# Patient Record
Sex: Female | Born: 1975 | Race: Black or African American | Hispanic: No | Marital: Single | Smoking: Former smoker
Health system: Southern US, Community
[De-identification: ages and names within clinical notes are randomized; demographics above are authoritative.]

## PROBLEM LIST (undated history)

## (undated) ENCOUNTER — Inpatient Hospital Stay (HOSPITAL_COMMUNITY): Payer: Self-pay

## (undated) DIAGNOSIS — Z872 Personal history of diseases of the skin and subcutaneous tissue: Secondary | ICD-10-CM

## (undated) DIAGNOSIS — J189 Pneumonia, unspecified organism: Secondary | ICD-10-CM

## (undated) DIAGNOSIS — N730 Acute parametritis and pelvic cellulitis: Secondary | ICD-10-CM

## (undated) DIAGNOSIS — K219 Gastro-esophageal reflux disease without esophagitis: Secondary | ICD-10-CM

## (undated) DIAGNOSIS — E559 Vitamin D deficiency, unspecified: Secondary | ICD-10-CM

## (undated) DIAGNOSIS — Z972 Presence of dental prosthetic device (complete) (partial): Secondary | ICD-10-CM

## (undated) DIAGNOSIS — Z8719 Personal history of other diseases of the digestive system: Secondary | ICD-10-CM

## (undated) DIAGNOSIS — I10 Essential (primary) hypertension: Secondary | ICD-10-CM

## (undated) HISTORY — DX: Acute parametritis and pelvic cellulitis: N73.0

---

## 1999-01-23 ENCOUNTER — Emergency Department (HOSPITAL_COMMUNITY): Admission: EM | Admit: 1999-01-23 | Discharge: 1999-01-23 | Payer: Self-pay | Admitting: *Deleted

## 1999-01-26 ENCOUNTER — Emergency Department (HOSPITAL_COMMUNITY): Admission: EM | Admit: 1999-01-26 | Discharge: 1999-01-26 | Payer: Self-pay | Admitting: *Deleted

## 1999-01-31 ENCOUNTER — Emergency Department (HOSPITAL_COMMUNITY): Admission: EM | Admit: 1999-01-31 | Discharge: 1999-01-31 | Payer: Self-pay | Admitting: Internal Medicine

## 1999-06-29 ENCOUNTER — Other Ambulatory Visit: Admission: RE | Admit: 1999-06-29 | Discharge: 1999-06-29 | Payer: Self-pay | Admitting: Obstetrics

## 1999-07-16 ENCOUNTER — Emergency Department (HOSPITAL_COMMUNITY): Admission: EM | Admit: 1999-07-16 | Discharge: 1999-07-16 | Payer: Self-pay | Admitting: Emergency Medicine

## 2000-02-08 DIAGNOSIS — Z872 Personal history of diseases of the skin and subcutaneous tissue: Secondary | ICD-10-CM

## 2000-02-08 HISTORY — DX: Personal history of diseases of the skin and subcutaneous tissue: Z87.2

## 2000-05-30 ENCOUNTER — Encounter: Admission: RE | Admit: 2000-05-30 | Discharge: 2000-05-30 | Payer: Self-pay | Admitting: Family Medicine

## 2000-05-30 ENCOUNTER — Encounter: Payer: Self-pay | Admitting: Family Medicine

## 2001-01-16 ENCOUNTER — Inpatient Hospital Stay (HOSPITAL_COMMUNITY): Admission: AD | Admit: 2001-01-16 | Discharge: 2001-01-16 | Payer: Self-pay | Admitting: Obstetrics

## 2001-02-07 HISTORY — PX: CYST EXCISION: SHX5701

## 2001-04-01 ENCOUNTER — Inpatient Hospital Stay (HOSPITAL_COMMUNITY): Admission: AD | Admit: 2001-04-01 | Discharge: 2001-04-01 | Payer: Self-pay | Admitting: Obstetrics

## 2001-04-04 ENCOUNTER — Inpatient Hospital Stay (HOSPITAL_COMMUNITY): Admission: AD | Admit: 2001-04-04 | Discharge: 2001-04-06 | Payer: Self-pay | Admitting: Obstetrics

## 2001-06-06 ENCOUNTER — Encounter (INDEPENDENT_AMBULATORY_CARE_PROVIDER_SITE_OTHER): Payer: Self-pay | Admitting: Specialist

## 2001-06-06 ENCOUNTER — Ambulatory Visit (HOSPITAL_BASED_OUTPATIENT_CLINIC_OR_DEPARTMENT_OTHER): Admission: RE | Admit: 2001-06-06 | Discharge: 2001-06-06 | Payer: Self-pay | Admitting: *Deleted

## 2001-12-16 ENCOUNTER — Encounter: Payer: Self-pay | Admitting: Emergency Medicine

## 2001-12-16 ENCOUNTER — Emergency Department (HOSPITAL_COMMUNITY): Admission: EM | Admit: 2001-12-16 | Discharge: 2001-12-17 | Payer: Self-pay | Admitting: Emergency Medicine

## 2003-01-21 ENCOUNTER — Other Ambulatory Visit: Admission: RE | Admit: 2003-01-21 | Discharge: 2003-01-21 | Payer: Self-pay | Admitting: Obstetrics and Gynecology

## 2003-02-08 ENCOUNTER — Inpatient Hospital Stay (HOSPITAL_COMMUNITY): Admission: AD | Admit: 2003-02-08 | Discharge: 2003-02-08 | Payer: Self-pay | Admitting: Obstetrics

## 2003-02-10 ENCOUNTER — Inpatient Hospital Stay (HOSPITAL_COMMUNITY): Admission: AD | Admit: 2003-02-10 | Discharge: 2003-02-10 | Payer: Self-pay | Admitting: Obstetrics

## 2003-02-12 ENCOUNTER — Inpatient Hospital Stay (HOSPITAL_COMMUNITY): Admission: AD | Admit: 2003-02-12 | Discharge: 2003-02-12 | Payer: Self-pay | Admitting: Obstetrics

## 2003-03-26 ENCOUNTER — Inpatient Hospital Stay (HOSPITAL_COMMUNITY): Admission: AD | Admit: 2003-03-26 | Discharge: 2003-03-26 | Payer: Self-pay | Admitting: Obstetrics

## 2003-05-04 ENCOUNTER — Emergency Department (HOSPITAL_COMMUNITY): Admission: EM | Admit: 2003-05-04 | Discharge: 2003-05-04 | Payer: Self-pay | Admitting: Emergency Medicine

## 2004-02-08 DIAGNOSIS — N730 Acute parametritis and pelvic cellulitis: Secondary | ICD-10-CM

## 2004-02-08 HISTORY — DX: Acute parametritis and pelvic cellulitis: N73.0

## 2004-02-17 ENCOUNTER — Ambulatory Visit (HOSPITAL_COMMUNITY): Admission: RE | Admit: 2004-02-17 | Discharge: 2004-02-17 | Payer: Self-pay | Admitting: Obstetrics & Gynecology

## 2004-03-12 ENCOUNTER — Emergency Department (HOSPITAL_COMMUNITY): Admission: EM | Admit: 2004-03-12 | Discharge: 2004-03-12 | Payer: Self-pay | Admitting: Emergency Medicine

## 2004-03-18 ENCOUNTER — Emergency Department (HOSPITAL_COMMUNITY): Admission: EM | Admit: 2004-03-18 | Discharge: 2004-03-19 | Payer: Self-pay | Admitting: Emergency Medicine

## 2004-10-08 ENCOUNTER — Emergency Department (HOSPITAL_COMMUNITY): Admission: EM | Admit: 2004-10-08 | Discharge: 2004-10-08 | Payer: Self-pay | Admitting: Emergency Medicine

## 2005-04-22 ENCOUNTER — Ambulatory Visit (HOSPITAL_COMMUNITY): Admission: RE | Admit: 2005-04-22 | Discharge: 2005-04-22 | Payer: Self-pay | Admitting: Obstetrics

## 2005-05-02 ENCOUNTER — Emergency Department (HOSPITAL_COMMUNITY): Admission: EM | Admit: 2005-05-02 | Discharge: 2005-05-03 | Payer: Self-pay | Admitting: Emergency Medicine

## 2006-08-22 ENCOUNTER — Emergency Department (HOSPITAL_COMMUNITY): Admission: EM | Admit: 2006-08-22 | Discharge: 2006-08-22 | Payer: Self-pay | Admitting: Emergency Medicine

## 2006-10-03 ENCOUNTER — Ambulatory Visit (HOSPITAL_COMMUNITY): Admission: RE | Admit: 2006-10-03 | Discharge: 2006-10-03 | Payer: Self-pay | Admitting: Obstetrics

## 2006-11-17 ENCOUNTER — Ambulatory Visit (HOSPITAL_COMMUNITY): Admission: RE | Admit: 2006-11-17 | Discharge: 2006-11-17 | Payer: Self-pay | Admitting: Obstetrics

## 2006-12-05 ENCOUNTER — Emergency Department (HOSPITAL_COMMUNITY): Admission: EM | Admit: 2006-12-05 | Discharge: 2006-12-05 | Payer: Self-pay | Admitting: Emergency Medicine

## 2007-08-28 ENCOUNTER — Emergency Department (HOSPITAL_COMMUNITY): Admission: EM | Admit: 2007-08-28 | Discharge: 2007-08-28 | Payer: Self-pay | Admitting: Emergency Medicine

## 2008-01-25 ENCOUNTER — Emergency Department (HOSPITAL_COMMUNITY): Admission: EM | Admit: 2008-01-25 | Discharge: 2008-01-25 | Payer: Self-pay | Admitting: Emergency Medicine

## 2009-03-18 ENCOUNTER — Emergency Department (HOSPITAL_COMMUNITY): Admission: EM | Admit: 2009-03-18 | Discharge: 2009-03-18 | Payer: Self-pay | Admitting: Emergency Medicine

## 2009-12-06 ENCOUNTER — Emergency Department (HOSPITAL_COMMUNITY): Admission: EM | Admit: 2009-12-06 | Discharge: 2009-12-06 | Payer: Self-pay | Admitting: Emergency Medicine

## 2010-04-21 LAB — WOUND CULTURE: Gram Stain: NONE SEEN

## 2010-06-25 NOTE — Op Note (Signed)
[place]  Patient:    Emily Saunders, Emily Saunders Visit Number: [id number] MRN: [id number]          Service Type: DSU Location: [facility] Attending Physician:  Kendell Bane Dictated by:   Lowell Bouton, M.D. Proc. Date: 06/06/01 Admit Date:  06/06/2001   CC:         Kathreen Cosier, M.D.   Operative Report  PREOPERATIVE DIAGNOSIS:  Dorsal ganglion, right wrist.  POSTOPERATIVE DIAGNOSIS:  Dorsal ganglion, right wrist.  OPERATION PERFORMED:  Excision of dorsal ganglion right wrist.  SURGEON:  Lowell Bouton, M.D.  ANESTHESIA:  General.  OPERATIVE FINDINGS:  The patient had a large dorsal ganglion that appeared to arise from the scapholunate interval.  There was an accessory muscle that was just volar to the ganglion.  DESCRIPTION OF PROCEDURE:  Under general anesthesia with a tourniquet on the right arm, the right hand was prepped and draped in the usual fashion and after exsanguinating the limb, the tourniquet was inflated to 225 mHg.  A transverse incision was made on the dorsum of the wrist overlying the mass. Blunt dissection was carried through the subcutaneous tissues and bleeding points were coagulated.  Blunt dissection was carried down to the cyst and it was dissected out bluntly off of the capitate distally and then proximally at the scapholunate area where the stalk was identified.  After completely excising the ganglion, the stalk was completely excised and the capsule was opened.  No arthritic changes were noted.  There was a small accessory muscle distally adherent to the volar aspect of the ganglion.  This did not appear to have any function.  The wound was then irrigated copiously.  Bleeding was controlled with electrocautery.  A vessel loop drain was left in for drainage and the subcutaneous tissues were closed with 4-0 Vicryl, the skin with a 3-0 subcuticular Prolene.  Steri-Strips were applied,  followed by sterile dressings and a volar wrist splint.  The patient tolerated the procedure well and went to the recovery room awake and stable in good condition. Dictated by:   Lowell Bouton, M.D. Attending Physician:  Kendell Bane DD:  06/06/01 TD:  06/06/01 Job: [id number]

## 2010-07-19 ENCOUNTER — Emergency Department (HOSPITAL_COMMUNITY)
Admission: EM | Admit: 2010-07-19 | Discharge: 2010-07-19 | Disposition: A | Discharge status: Home / Self Care | Payer: Self-pay | Attending: Emergency Medicine | Admitting: Emergency Medicine

## 2010-07-19 ENCOUNTER — Emergency Department (HOSPITAL_COMMUNITY): Payer: Self-pay

## 2010-07-19 DIAGNOSIS — R05 Cough: Secondary | ICD-10-CM | POA: Insufficient documentation

## 2010-07-19 DIAGNOSIS — R059 Cough, unspecified: Secondary | ICD-10-CM | POA: Insufficient documentation

## 2010-07-19 DIAGNOSIS — F17200 Nicotine dependence, unspecified, uncomplicated: Secondary | ICD-10-CM | POA: Insufficient documentation

## 2010-07-19 DIAGNOSIS — J40 Bronchitis, not specified as acute or chronic: Secondary | ICD-10-CM | POA: Insufficient documentation

## 2010-07-30 ENCOUNTER — Emergency Department (HOSPITAL_COMMUNITY)
Admission: EM | Admit: 2010-07-30 | Discharge: 2010-07-31 | Disposition: A | Discharge status: Home / Self Care | Payer: Medicaid Other | Attending: Emergency Medicine | Admitting: Emergency Medicine

## 2010-07-30 DIAGNOSIS — R059 Cough, unspecified: Secondary | ICD-10-CM | POA: Insufficient documentation

## 2010-07-30 DIAGNOSIS — J40 Bronchitis, not specified as acute or chronic: Secondary | ICD-10-CM | POA: Insufficient documentation

## 2010-07-30 DIAGNOSIS — R05 Cough: Secondary | ICD-10-CM | POA: Insufficient documentation

## 2010-12-10 ENCOUNTER — Emergency Department (HOSPITAL_COMMUNITY)
Admission: EM | Admit: 2010-12-10 | Discharge: 2010-12-11 | Disposition: A | Discharge status: Home / Self Care | Payer: Self-pay | Attending: Emergency Medicine | Admitting: Emergency Medicine

## 2010-12-10 DIAGNOSIS — M25519 Pain in unspecified shoulder: Secondary | ICD-10-CM | POA: Insufficient documentation

## 2010-12-23 ENCOUNTER — Encounter: Payer: Self-pay | Admitting: Emergency Medicine

## 2010-12-23 ENCOUNTER — Emergency Department (HOSPITAL_COMMUNITY)
Admission: EM | Admit: 2010-12-23 | Discharge: 2010-12-23 | Disposition: A | Discharge status: Home / Self Care | Payer: Medicaid Other | Attending: Emergency Medicine | Admitting: Emergency Medicine

## 2010-12-23 DIAGNOSIS — M5412 Radiculopathy, cervical region: Secondary | ICD-10-CM | POA: Insufficient documentation

## 2010-12-23 DIAGNOSIS — M25519 Pain in unspecified shoulder: Secondary | ICD-10-CM | POA: Insufficient documentation

## 2010-12-23 DIAGNOSIS — F17200 Nicotine dependence, unspecified, uncomplicated: Secondary | ICD-10-CM | POA: Insufficient documentation

## 2010-12-23 MED ORDER — PREDNISONE 10 MG PO TABS: ORAL_TABLET | ORAL | Status: DC

## 2010-12-23 MED ORDER — OXYCODONE-ACETAMINOPHEN 5-325 MG PO TABS: 1.0000 | ORAL_TABLET | ORAL | Status: AC | PRN

## 2010-12-23 NOTE — ED Provider Notes (Signed)
History     CSN: [id number] Arrival date & time: 12/23/2010  5:32 PM   First MD Initiated Contact with Patient 12/23/10 1758      Chief Complaint  Patient presents with  . Shoulder Pain    Left shoulder pain x 3 weeks    (Consider location/radiation/quality/duration/timing/severity/associated sxs/prior treatment) Patient is a 35 y.o. female presenting with shoulder pain. The history is provided by the patient.  Shoulder Pain The current episode started 1 to 4 weeks ago. The problem occurs constantly. The problem has been unchanged. Pertinent negatives include no chills or fever. Associated symptoms comments: She reports that she remembers that her neck was sore prior to shoulder symptoms but shoulder is significantly worse than neck and is persistent.. Exacerbated by: Any movement of the shoulder. She has tried oral narcotics and NSAIDs for the symptoms. The treatment provided no relief.    History reviewed. No pertinent past medical history.  History reviewed. No pertinent past surgical history.  No family history on file.  History  Substance Use Topics  . Smoking status: Current Everyday Smoker  . Smokeless tobacco: Never Used  . Alcohol Use: Yes    OB History    Grav Para Term Preterm Abortions TAB SAB Ect Mult Living                  Review of Systems  Constitutional: Negative for fever and chills.  HENT: Negative.   Respiratory: Negative.   Cardiovascular: Negative.   Gastrointestinal: Negative.   Musculoskeletal:       See HPI.  Skin: Negative.   Neurological: Negative.     Allergies  Review of patient's allergies indicates no known allergies.  Home Medications   Current Outpatient Rx  Name Route Sig Dispense Refill  . VITAMIN D 1000 UNITS PO TABS Oral Take 1,000 Units by mouth daily.        BP 136/76  Pulse 75  Temp(Src) 97.6 F (36.4 C) (Oral)  Resp 18  SpO2 100%  Physical Exam  Constitutional: She is oriented to person, place, and time.  She appears well-developed and well-nourished.  Neck: Normal range of motion. Neck supple.       No cervical tenderness.  Pulmonary/Chest: Effort normal. She exhibits no tenderness.  Musculoskeletal:       Left shoulder without swelling. Tender over anterior joint, nontender bursa. Pain on full range of motion, active or passive range. Pulses 2+. No strength deficits.  Neurological: She is alert and oriented to person, place, and time.  Skin: Skin is warm and dry.    ED Course  Procedures (including critical care time)  Labs Reviewed - No data to display No results found.   No diagnosis found.    MDM  Patient in ED for second evaluation. Unable to follow up with orthopedics. Concern for cervical radiculopathy given nontraumatic onset of shoulder pain and history of neck pain. Will get outpatient MRI cervical spine to aid patient in furthering evaluation. To be followed up with primary care.        Rodena Medin, PA 12/23/10 [phone]

## 2010-12-23 NOTE — ED Notes (Signed)
Left shoulder pain started x 3 weeks ago.Was seen on Oct 30 for same problem. Was given medication but did not help. No Known injury.

## 2010-12-24 NOTE — ED Provider Notes (Signed)
Medical screening examination/treatment/procedure(s) were performed by non-physician practitioner and as supervising physician I was immediately available for consultation/collaboration.   Hala Narula, MD 12/24/10 0031 

## 2011-02-05 ENCOUNTER — Emergency Department (HOSPITAL_COMMUNITY)
Admission: EM | Admit: 2011-02-05 | Discharge: 2011-02-05 | Disposition: A | Discharge status: Home / Self Care | Payer: Medicaid Other | Attending: Emergency Medicine | Admitting: Emergency Medicine

## 2011-02-05 ENCOUNTER — Encounter (HOSPITAL_COMMUNITY): Payer: Self-pay

## 2011-02-05 DIAGNOSIS — T148 Other injury of unspecified body region: Secondary | ICD-10-CM | POA: Insufficient documentation

## 2011-02-05 DIAGNOSIS — R21 Rash and other nonspecific skin eruption: Secondary | ICD-10-CM | POA: Insufficient documentation

## 2011-02-05 DIAGNOSIS — W57XXXA Bitten or stung by nonvenomous insect and other nonvenomous arthropods, initial encounter: Secondary | ICD-10-CM | POA: Insufficient documentation

## 2011-02-05 MED ORDER — HYDROCORTISONE 1 % EX CREA: TOPICAL_CREAM | CUTANEOUS | Status: AC

## 2011-02-05 MED ORDER — DOXYCYCLINE HYCLATE 100 MG PO CAPS: 100.0000 mg | ORAL_CAPSULE | Freq: Two times a day (BID) | ORAL | Status: AC

## 2011-02-05 NOTE — ED Notes (Signed)
Pt in from home with c/o insect bite to the right forearm and forehead states noted yesterday denies pain states some drainage was present

## 2011-02-05 NOTE — ED Provider Notes (Signed)
History     CSN: [id number]  Arrival date & time 02/05/11  1428   First MD Initiated Contact with Patient 02/05/11 1458      Chief Complaint  Patient presents with  . Insect Bite    (Consider location/radiation/quality/duration/timing/severity/associated sxs/prior treatment) HPI Comments: Patient with 3 areas of induration on right forearm that she associates with being bitten by an insect. She did not witness herself being bitten. Other family members do not have the same symptoms. She states that the areas are itchy. She denies fever, nausea, vomiting.  Patient is a 35 y.o. female presenting with allergic reaction. The history is provided by the patient.  Allergic Reaction The primary symptoms are  rash. The primary symptoms do not include wheezing, shortness of breath, vomiting or urticaria. The current episode started yesterday. The problem has not changed since onset.This is a new problem.  The onset of the reaction was associated with insect bite/sting.    History reviewed. No pertinent past medical history.  History reviewed. No pertinent past surgical history.  History reviewed. No pertinent family history.  History  Substance Use Topics  . Smoking status: Current Everyday Smoker  . Smokeless tobacco: Never Used  . Alcohol Use: Yes    OB History    Grav Para Term Preterm Abortions TAB SAB Ect Mult Living                  Review of Systems  Constitutional: Negative for fever and chills.  Respiratory: Negative for shortness of breath and wheezing.   Gastrointestinal: Negative for vomiting.  Musculoskeletal: Negative for joint swelling and arthralgias.  Skin: Positive for color change and rash. Negative for wound.  Neurological: Negative for weakness and numbness.    Allergies  Review of patient's allergies indicates no known allergies.  Home Medications   Current Outpatient Rx  Name Route Sig Dispense Refill  . DOXYCYCLINE HYCLATE 100 MG PO CAPS Oral  Take 1 capsule (100 mg total) by mouth 2 (two) times daily. 14 capsule 0  . HYDROCORTISONE 1 % EX CREA  Apply to affected area 2 times daily 30 g 0    BP 125/84  Pulse 73  Temp(Src) 98.6 F (37 C) (Oral)  Resp 18  SpO2 100%  LMP 01/25/2011  Physical Exam  Nursing note and vitals reviewed. Constitutional: She is oriented to person, place, and time. She appears well-developed and well-nourished.  HENT:  Head: Normocephalic and atraumatic.  Neck: Normal range of motion. Neck supple.  Musculoskeletal: She exhibits no tenderness.  Neurological: She is alert and oriented to person, place, and time.       Distal motor, sensation, and vascular intact.   Skin: Skin is warm and dry. Rash noted. No erythema.       3 discrete circular areas of induration approximately 1 cm in diameter on right forearm. There is mild surrounding redness and firm induration. Cannot rule out secondary infection time. No lymphangitis.  Psychiatric: She has a normal mood and affect. Her behavior is normal.    ED Course  Procedures (including critical care time)  Labs Reviewed - No data to display No results found.   1. Insect bite    4:25 PM patient seen and examined. Will treat for allergic reaction likely due to insect bite. Will prescribe antibiotics given potential for secondary bacterial infection. Urged patient to return with worsening pain, redness, swelling, fever, or if she has any other concerns. Patient verbalizes understanding and agrees with plan.  MDM  3 areas of allergic reaction due to insect bite. Possible secondary infection, antibiotics prescribed.        Eustace Moore [place], [place] 02/05/11 1626

## 2011-02-06 NOTE — ED Provider Notes (Signed)
Medical screening examination/treatment/procedure(s) were performed by non-physician practitioner and as supervising physician I was immediately available for consultation/collaboration.    Nelia Shi, MD 02/06/11 [phone]

## 2012-04-22 ENCOUNTER — Encounter (HOSPITAL_COMMUNITY): Payer: Self-pay | Admitting: Emergency Medicine

## 2012-04-22 ENCOUNTER — Emergency Department (HOSPITAL_COMMUNITY)
Admission: EM | Admit: 2012-04-22 | Discharge: 2012-04-22 | Disposition: A | Discharge status: Home / Self Care | Payer: Medicaid Other | Attending: Emergency Medicine | Admitting: Emergency Medicine

## 2012-04-22 DIAGNOSIS — N766 Ulceration of vulva: Secondary | ICD-10-CM | POA: Insufficient documentation

## 2012-04-22 DIAGNOSIS — R300 Dysuria: Secondary | ICD-10-CM | POA: Insufficient documentation

## 2012-04-22 DIAGNOSIS — Z79899 Other long term (current) drug therapy: Secondary | ICD-10-CM | POA: Insufficient documentation

## 2012-04-22 DIAGNOSIS — Z3202 Encounter for pregnancy test, result negative: Secondary | ICD-10-CM | POA: Insufficient documentation

## 2012-04-22 DIAGNOSIS — F17200 Nicotine dependence, unspecified, uncomplicated: Secondary | ICD-10-CM | POA: Insufficient documentation

## 2012-04-22 LAB — POCT PREGNANCY, URINE: Preg Test, Ur: NEGATIVE

## 2012-04-22 LAB — URINALYSIS, ROUTINE W REFLEX MICROSCOPIC
Glucose, UA: NEGATIVE mg/dL
Leukocytes, UA: NEGATIVE
Nitrite: NEGATIVE
Urobilinogen, UA: 0.2 mg/dL (ref 0.0–1.0)

## 2012-04-22 LAB — WET PREP, GENITAL: Yeast Wet Prep HPF POC: NONE SEEN

## 2012-04-22 MED ORDER — ACYCLOVIR 400 MG PO TABS: 400.0000 mg | ORAL_TABLET | Freq: Four times a day (QID) | ORAL | Status: DC

## 2012-04-22 NOTE — ED Provider Notes (Signed)
Medical screening examination/treatment/procedure(s) were performed by non-physician practitioner and as supervising physician I was immediately available for consultation/collaboration.   Glynn Octave, MD 04/22/12 1537

## 2012-04-22 NOTE — ED Notes (Addendum)
Pt presenting to ed with c/o dysuria pt denies hematuria at this time pt states onset x 1 week. Pt states it feels like "I'm peeing razor blades"

## 2012-04-22 NOTE — ED Provider Notes (Signed)
History     CSN: [id number]  Arrival date & time 04/22/12  1122   First MD Initiated Contact with Patient 04/22/12 1214      Chief Complaint  Patient presents with  . Dysuria    (Consider location/radiation/quality/duration/timing/severity/associated sxs/prior treatment) HPI  37 year old female with no significant past medical history presents complaining of dysuria. Patient states over the past week she has had burning on urination. Symptom has been persistent without evidence of increased urinary frequency or urgency. She denies hematuria, vaginal discharge, or vaginal bleeding. She denies fever, chills, nausea, vomiting, diarrhea, abdominal pain, back pain, or rash. No significant history of recurrent urinary tract infection. Her last menstrual period was 2 years ago and she has been off her Depo for nearly a year. She is sexually active and using protection every time. No prior history of STD. No pain with sexual activity.  History reviewed. No pertinent past medical history.  History reviewed. No pertinent past surgical history.  No family history on file.  History  Substance Use Topics  . Smoking status: Current Every Day Smoker -- 0.50 packs/day    Types: Cigarettes  . Smokeless tobacco: Never Used  . Alcohol Use: No    OB History   Grav Para Term Preterm Abortions TAB SAB Ect Mult Living                  Review of Systems  Constitutional:       10 Systems reviewed and all are negative for acute change except as noted in the HPI.     Allergies  Review of patient's allergies indicates no known allergies.  Home Medications   Current Outpatient Rx  Name  Route  Sig  Dispense  Refill  . cholecalciferol (VITAMIN D) 1000 UNITS tablet   Oral   Take 1,000 Units by mouth as directed. Pt takes M-F.         . ibuprofen (ADVIL,MOTRIN) 200 MG tablet   Oral   Take 400 mg by mouth every 8 (eight) hours as needed for pain.         . Multiple Vitamin  (MULTIVITAMIN WITH MINERALS) TABS   Oral   Take 1 tablet by mouth daily.           BP 113/78  Pulse 96  Temp(Src) 98.9 F (37.2 C) (Oral)  Resp 18  SpO2 99%  Physical Exam  Nursing note and vitals reviewed. Constitutional: She appears well-developed and well-nourished. No distress.  HENT:  Head: Normocephalic and atraumatic.  Eyes: Conjunctivae are normal.  Neck: Normal range of motion. Neck supple.  Cardiovascular: Normal rate and regular rhythm.   Pulmonary/Chest: Effort normal and breath sounds normal. She exhibits no tenderness.  Abdominal: Soft. There is tenderness (mild suprapubic tenderness without guarding or rebound. no hernia.  no cva tenderness).  Genitourinary: Vagina normal and uterus normal. There is no rash or lesion on the right labia. There is no rash or lesion on the left labia. Cervix exhibits no motion tenderness and no discharge. Right adnexum displays no mass and no tenderness. Left adnexum displays no mass and no tenderness. No erythema, tenderness or bleeding around the vagina. No vaginal discharge found.  Chaperone present:   No cva tenderness  Pt has 2 ulcerated lesions, one on her R outer labia measuring 3mm in diameter and the other is on the superior aspect of L inner labia measuring 3mm.  No pustular exudates, ttp.  No discharge  Vaginal vault with moderate white  curdlike discharge, no odor.   Lymphadenopathy:       Right: No inguinal adenopathy present.       Left: No inguinal adenopathy present.  Neurological: She is alert.  Skin: Skin is warm. No rash noted.  Psychiatric: She has a normal mood and affect.    ED Course  Procedures (including critical care time)  12:43 PM Pt only complaint is dysuria.  Abdomen nonsurgical, pregnancy test is negative.  Awaits UA.    12:50 PM UA is without evidence concerning for urinary tract infection. Will perform a pelvic exam and we'll check a possible STD.  Care discussed with attending.  1:42  PM Pt has 2 ulcerations to her labia on exam.  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  Doubt chanchre from syphilis.  DDx includes herpes simplex II, friction rubs.  Will give Zithromax 1000mg  PO here in ER for possible chanchroid and will also give acyclovir for treatment for possible herpes infection.  Referral to OBGYN given.  Return precaution.  UA and wet preps are unremarkable.  GC/Chl sent for culture.  Return precaution given.    Labs Reviewed  WET PREP, GENITAL - Abnormal; Notable for the following:    WBC, Wet Prep HPF POC FEW (*)    All other components within normal limits  GC/CHLAMYDIA PROBE AMP  URINALYSIS, ROUTINE W REFLEX MICROSCOPIC  POCT PREGNANCY, URINE   No results found.   1. Dysuria       MDM  BP 113/78  Pulse 96  Temp(Src) 98.9 F (37.2 C) (Oral)  Resp 18  SpO2 99%  I have reviewed nursing notes and vital signs.  I reviewed available ER/hospitalization records thought the EMR \        Fayrene Helper, PA-C 04/22/12 1448

## 2012-04-23 LAB — GC/CHLAMYDIA PROBE AMP: CT Probe RNA: NEGATIVE

## 2012-10-23 ENCOUNTER — Encounter (HOSPITAL_COMMUNITY): Payer: Self-pay | Admitting: Emergency Medicine

## 2012-10-23 ENCOUNTER — Emergency Department (HOSPITAL_COMMUNITY)
Admission: EM | Admit: 2012-10-23 | Discharge: 2012-10-23 | Disposition: A | Discharge status: Home / Self Care | Payer: Medicaid Other | Attending: Emergency Medicine | Admitting: Emergency Medicine

## 2012-10-23 ENCOUNTER — Emergency Department (HOSPITAL_COMMUNITY): Payer: Medicaid Other

## 2012-10-23 DIAGNOSIS — F17200 Nicotine dependence, unspecified, uncomplicated: Secondary | ICD-10-CM | POA: Insufficient documentation

## 2012-10-23 DIAGNOSIS — Z79899 Other long term (current) drug therapy: Secondary | ICD-10-CM | POA: Insufficient documentation

## 2012-10-23 DIAGNOSIS — J069 Acute upper respiratory infection, unspecified: Secondary | ICD-10-CM | POA: Insufficient documentation

## 2012-10-23 HISTORY — DX: Vitamin D deficiency, unspecified: E55.9

## 2012-10-23 MED ORDER — SALINE SPRAY 0.65 % NA SOLN
1.0000 | Freq: Once | NASAL | Status: AC
  Administered 2012-10-23: 1 via NASAL
  Filled 2012-10-23: qty 44

## 2012-10-23 MED ORDER — PSEUDOEPHEDRINE HCL ER 120 MG PO TB12
120.0000 mg | ORAL_TABLET | Freq: Two times a day (BID) | ORAL | Status: DC
  Administered 2012-10-23: 120 mg via ORAL
  Filled 2012-10-23: qty 1

## 2012-10-23 MED ORDER — PSEUDOEPHEDRINE HCL ER 120 MG PO TB12: 120.0000 mg | ORAL_TABLET | Freq: Two times a day (BID) | ORAL | Status: DC

## 2012-10-23 NOTE — ED Notes (Signed)
Pt reports nasal congestion and cough since this morning.

## 2012-10-23 NOTE — ED Provider Notes (Signed)
CSN: [id number]     Arrival date & time 10/23/12  2041 History  This chart was scribed for non-physician practitioner, Earley Favor, FNP working with Junius Argyle, MD by Greggory Stallion, ED scribe. This patient was seen in room WTR9/WTR9 and the patient's care was started at 10:08 PM.   Chief Complaint  Patient presents with  . Nasal Congestion  . Cough   The history is provided by the patient. No language interpreter was used.    HPI Comments: Emily Saunders is a 37 y.o. female who presents to the Emergency Department complaining of nasal congestion and cough that started this morning. She states she took tylenol and phenylephrine with no relief. Pt states she has no other associated symptoms. LNMP was yesterday.  No past medical history on file. No past surgical history on file. No family history on file. History  Substance Use Topics  . Smoking status: Current Every Day Smoker -- 0.50 packs/day    Types: Cigarettes  . Smokeless tobacco: Never Used  . Alcohol Use: No   OB History   Grav Para Term Preterm Abortions TAB SAB Ect Mult Living                 Review of Systems  Constitutional: Negative for fever and chills.  HENT: Positive for congestion. Negative for sore throat, trouble swallowing and postnasal drip.   Respiratory: Positive for cough. Negative for shortness of breath.   All other systems reviewed and are negative.    Allergies  Review of patient's allergies indicates no known allergies.  Home Medications   Current Outpatient Rx  Name  Route  Sig  Dispense  Refill  . acyclovir (ZOVIRAX) 400 MG tablet   Oral   Take 1 tablet (400 mg total) by mouth 4 (four) times daily.   50 tablet   0   . cholecalciferol (VITAMIN D) 1000 UNITS tablet   Oral   Take 1,000 Units by mouth as directed. Pt takes M-F.         . ibuprofen (ADVIL,MOTRIN) 200 MG tablet   Oral   Take 400 mg by mouth every 8 (eight) hours as needed for pain.         . Multiple  Vitamin (MULTIVITAMIN WITH MINERALS) TABS   Oral   Take 1 tablet by mouth daily.          BP 123/88  Pulse 95  Temp(Src) 98 F (36.7 C) (Oral)  Resp 20  SpO2 100%  LMP 10/16/2012  Physical Exam  Nursing note and vitals reviewed. Constitutional: She is oriented to person, place, and time. She appears well-developed and well-nourished. No distress.  HENT:  Head: Normocephalic and atraumatic.  Mouth/Throat: Oropharynx is clear and moist.  Mild fullness in face.   Eyes: EOM are normal.  Neck: Neck supple. No tracheal deviation present.  Cardiovascular: Normal rate.   Pulmonary/Chest: Effort normal and breath sounds normal. No respiratory distress. She has no wheezes.  Musculoskeletal: Normal range of motion.  Lymphadenopathy:    She has no cervical adenopathy.  Neurological: She is alert and oriented to person, place, and time.  Skin: Skin is warm and dry.  Psychiatric: She has a normal mood and affect. Her behavior is normal.    ED Course  Procedures (including critical care time)  DIAGNOSTIC STUDIES: Oxygen Saturation is 100% on RA, normal by my interpretation.    COORDINATION OF CARE: 10:11 PM-Discussed treatment plan which includes discharge with pt at bedside and  pt agreed to plan.   Labs Review Labs Reviewed - No data to display Imaging Review No results found.  MDM  No diagnosis found.  Patient took one dose of Tylenol, sinus, which contains 325 mg of Tylenol, and 30 milligrams of phenylephrine, this gave her little relief.  We'll start Sudafed 120 mg every 12 hours saline nasal spray     I personally performed the services described in this documentation, which was scribed in my presence. The recorded information has been reviewed and is accurate.  Arman Filter, NP 10/23/12 2220

## 2012-10-24 NOTE — ED Provider Notes (Signed)
Medical screening examination/treatment/procedure(s) were performed by non-physician practitioner and as supervising physician I was immediately available for consultation/collaboration.   Junius Argyle, MD 10/24/12 [phone]

## 2012-10-27 ENCOUNTER — Emergency Department (HOSPITAL_COMMUNITY)
Admission: EM | Admit: 2012-10-27 | Discharge: 2012-10-27 | Disposition: A | Discharge status: Home / Self Care | Payer: Medicaid Other | Attending: Emergency Medicine | Admitting: Emergency Medicine

## 2012-10-27 ENCOUNTER — Encounter (HOSPITAL_COMMUNITY): Payer: Self-pay | Admitting: Emergency Medicine

## 2012-10-27 ENCOUNTER — Emergency Department (HOSPITAL_COMMUNITY): Payer: Medicaid Other

## 2012-10-27 DIAGNOSIS — J159 Unspecified bacterial pneumonia: Secondary | ICD-10-CM | POA: Insufficient documentation

## 2012-10-27 DIAGNOSIS — F17200 Nicotine dependence, unspecified, uncomplicated: Secondary | ICD-10-CM | POA: Insufficient documentation

## 2012-10-27 DIAGNOSIS — J189 Pneumonia, unspecified organism: Secondary | ICD-10-CM

## 2012-10-27 DIAGNOSIS — Z862 Personal history of diseases of the blood and blood-forming organs and certain disorders involving the immune mechanism: Secondary | ICD-10-CM | POA: Insufficient documentation

## 2012-10-27 DIAGNOSIS — R51 Headache: Secondary | ICD-10-CM | POA: Insufficient documentation

## 2012-10-27 DIAGNOSIS — R062 Wheezing: Secondary | ICD-10-CM | POA: Insufficient documentation

## 2012-10-27 DIAGNOSIS — Z8639 Personal history of other endocrine, nutritional and metabolic disease: Secondary | ICD-10-CM | POA: Insufficient documentation

## 2012-10-27 MED ORDER — GUAIFENESIN 100 MG/5ML PO LIQD: 100.0000 mg | ORAL | Status: DC | PRN

## 2012-10-27 MED ORDER — AZITHROMYCIN 250 MG PO TABS: 250.0000 mg | ORAL_TABLET | Freq: Every day | ORAL | Status: DC

## 2012-10-27 MED ORDER — AZITHROMYCIN 250 MG PO TABS
500.0000 mg | ORAL_TABLET | Freq: Once | ORAL | Status: AC
  Administered 2012-10-27: 500 mg via ORAL
  Filled 2012-10-27: qty 2

## 2012-10-27 MED ORDER — ALBUTEROL SULFATE HFA 108 (90 BASE) MCG/ACT IN AERS
2.0000 | INHALATION_SPRAY | RESPIRATORY_TRACT | Status: DC | PRN
  Administered 2012-10-27: 2 via RESPIRATORY_TRACT
  Filled 2012-10-27: qty 6.7

## 2012-10-27 NOTE — ED Provider Notes (Signed)
CSN: [id number]     Arrival date & time 10/27/12  1609 History   This chart was scribed for non-physician practitioner Marlon Pel working with Gilda Crease, by Carl Best, ED Scribe. This patient was seen in room WTR5/WTR5 and the patient's care was started at 5:30 PM.    Chief Complaint  Patient presents with  . Cough  . Nasal Congestion    Patient is a 37 y.o. female presenting with cough. The history is provided by the patient. No language interpreter was used.  Cough Associated symptoms: no fever    HPI Comments: Emily Saunders is a 37 y.o. female who presents to the Emergency Department complaining of constant, worsening chest congestion that has persisted for the past four days. She states that she went to the doctor four days ago for nasal congestion and the beginnings of a cough and was diagnosed with an upper respiratory infection.  The patient states that the cough and chest congestion have worsened since then.  She lists a headache as an associated symptom.  Patient denies emesis, fever, and nausea as associated symptoms.  Patient complains of headache.  Patient has taken Sudafed for her symptoms but with no relief.  Patient denies a history of asthma, diabetes, hypertension.  Patient is a smoker.    Past Medical History  Diagnosis Date  . Vitamin D deficiency    No past surgical history on file. No family history on file. History  Substance Use Topics  . Smoking status: Current Every Day Smoker -- 0.50 packs/day    Types: Cigarettes  . Smokeless tobacco: Never Used  . Alcohol Use: No   OB History   Grav Para Term Preterm Abortions TAB SAB Ect Mult Living                 Review of Systems  Constitutional: Negative for fever.  Respiratory: Positive for cough.   Gastrointestinal: Negative for nausea and vomiting.  All other systems reviewed and are negative.    Allergies  Review of patient's allergies indicates no known allergies.  Home  Medications   Current Outpatient Rx  Name  Route  Sig  Dispense  Refill  . pseudoephedrine (SUDAFED) 120 MG 12 hr tablet   Oral   Take 1 tablet (120 mg total) by mouth every 12 (twelve) hours.   10 tablet   0   . azithromycin (ZITHROMAX) 250 MG tablet   Oral   Take 1 tablet (250 mg total) by mouth daily. 1 tab every day until finished.   4 tablet   0   . guaiFENesin (ROBITUSSIN) 100 MG/5ML liquid   Oral   Take 5-10 mLs (100-200 mg total) by mouth every 4 (four) hours as needed for cough.   60 mL   0    Triage Vitals: BP 128/88  Pulse 98  Temp(Src) 97.9 F (36.6 C) (Oral)  Resp 20  SpO2 97%  LMP 10/16/2012  Physical Exam  Nursing note and vitals reviewed. Constitutional: She is oriented to person, place, and time. She appears well-developed and well-nourished. No distress.  HENT:  Head: Normocephalic and atraumatic.  Eyes: EOM are normal.  Neck: Neck supple. No tracheal deviation present.  Cardiovascular: Normal rate.   Pulmonary/Chest: No respiratory distress. She has wheezes. She has rhonchi.  Musculoskeletal: Normal range of motion.  Neurological: She is alert and oriented to person, place, and time.  Skin: Skin is warm and dry.  Psychiatric: She has a normal mood and affect.  Her behavior is normal.    ED Course  Procedures (including critical care time)  DIAGNOSTIC STUDIES: Oxygen Saturation is 97% on room air, normal by my interpretation.    COORDINATION OF CARE: 5:31 PM- Discussed chest x-ray results with the patient that revealed pneumonia.  Discussed prescribing the patient antibiotics, cough medication, and a nebulizer before discharging the patient.  Advised the patient to take the antibiotics four times a day for the next four days.  Also advised the patient not to smoke or take the cough medicine during the day.  Told the patient to return to the ED if the symptoms worsen.  Patient agreed to treatment plan.    Medications  albuterol (PROVENTIL  HFA;VENTOLIN HFA) 108 (90 BASE) MCG/ACT inhaler 2 puff (2 puffs Inhalation Given 10/27/12 1744)  azithromycin (ZITHROMAX) tablet 500 mg (500 mg Oral Given 10/27/12 1744)    Labs Review Labs Reviewed - No data to display Imaging Review Dg Chest 2 View  10/27/2012   *RADIOLOGY REPORT*  Clinical Data: Cough  CHEST - 2 VIEW  Comparison: July 19, 2010  Findings: There is patchy consolidation medial right lung base. The left lung is clear.  The mediastinal contour and cardiac silhouette are normal.  The soft tissues and osseous structures are normal.  IMPRESSION: Right medial lung base pneumonia.   Original Report Authenticated By: Sherian Rein, M.D.    MDM   1. CAP (community acquired pneumonia)    * 37 y.o.Eleesha A Rayo's evaluation in the Emergency Department is complete. It has been determined that no acute conditions requiring further emergency intervention are present at this time. The patient/guardian have been advised of the diagnosis and plan. We have discussed signs and symptoms that warrant return to the ED, such as changes or worsening in symptoms.  Vital signs are stable at discharge. Filed Vitals:   10/27/12 1617  BP: 128/88  Pulse: 98  Temp: 97.9 F (36.6 C)  Resp: 20    Patient/guardian has voiced understanding and agreed to follow-up with the PCP or specialist.  I personally performed the services described in this documentation, which was scribed in my presence. The recorded information has been reviewed and is accurate. \   Dorthula Matas, PA-C 10/27/12 1752

## 2012-10-27 NOTE — ED Notes (Signed)
Pt c/o cough and nasal/chest congestion since Tuesday.  Has been seen for this previously.

## 2012-10-27 NOTE — ED Provider Notes (Signed)
Medical screening examination/treatment/procedure(s) were performed by non-physician practitioner and as supervising physician I was immediately available for consultation/collaboration.    Melchor Kirchgessner J. Aevah Stansbery, MD 10/27/12 2220 

## 2012-12-15 ENCOUNTER — Emergency Department (HOSPITAL_COMMUNITY)
Admission: EM | Admit: 2012-12-15 | Discharge: 2012-12-15 | Disposition: A | Discharge status: Home / Self Care | Payer: Medicaid Other | Attending: Emergency Medicine | Admitting: Emergency Medicine

## 2012-12-15 ENCOUNTER — Encounter (HOSPITAL_COMMUNITY): Payer: Self-pay | Admitting: Emergency Medicine

## 2012-12-15 DIAGNOSIS — Z79899 Other long term (current) drug therapy: Secondary | ICD-10-CM | POA: Insufficient documentation

## 2012-12-15 DIAGNOSIS — Z872 Personal history of diseases of the skin and subcutaneous tissue: Secondary | ICD-10-CM | POA: Insufficient documentation

## 2012-12-15 DIAGNOSIS — Z792 Long term (current) use of antibiotics: Secondary | ICD-10-CM | POA: Insufficient documentation

## 2012-12-15 DIAGNOSIS — Z8639 Personal history of other endocrine, nutritional and metabolic disease: Secondary | ICD-10-CM | POA: Insufficient documentation

## 2012-12-15 DIAGNOSIS — F17200 Nicotine dependence, unspecified, uncomplicated: Secondary | ICD-10-CM | POA: Insufficient documentation

## 2012-12-15 DIAGNOSIS — Z862 Personal history of diseases of the blood and blood-forming organs and certain disorders involving the immune mechanism: Secondary | ICD-10-CM | POA: Insufficient documentation

## 2012-12-15 DIAGNOSIS — IMO0002 Reserved for concepts with insufficient information to code with codable children: Secondary | ICD-10-CM | POA: Insufficient documentation

## 2012-12-15 DIAGNOSIS — K644 Residual hemorrhoidal skin tags: Secondary | ICD-10-CM

## 2012-12-15 HISTORY — DX: Personal history of diseases of the skin and subcutaneous tissue: Z87.2

## 2012-12-15 MED ORDER — HYDROCORTISONE 2.5 % RE CREA: TOPICAL_CREAM | RECTAL | Status: DC

## 2012-12-15 MED ORDER — DOCUSATE SODIUM 100 MG PO CAPS: 100.0000 mg | ORAL_CAPSULE | Freq: Two times a day (BID) | ORAL | Status: DC

## 2012-12-15 NOTE — Discharge Instructions (Signed)
Hemorrhoids Hemorrhoids are swollen veins around the rectum or anus. There are two types of hemorrhoids:   Internal hemorrhoids. These occur in the veins just inside the rectum. They may poke through to the outside and become irritated and painful.  External hemorrhoids. These occur in the veins outside the anus and can be felt as a painful swelling or hard lump near the anus. CAUSES  Pregnancy.   Obesity.   Constipation or diarrhea.   Straining to have a bowel movement.   Sitting for long periods on the toilet.  Heavy lifting or other activity that caused you to strain.  Anal intercourse. SYMPTOMS   Pain.   Anal itching or irritation.   Rectal bleeding.   Fecal leakage.   Anal swelling.   One or more lumps around the anus.  DIAGNOSIS  Your caregiver may be able to diagnose hemorrhoids by visual examination. Other examinations or tests that may be performed include:   Examination of the rectal area with a gloved hand (digital rectal exam).   Examination of anal canal using a small tube (scope).   A blood test if you have lost a significant amount of blood.  A test to look inside the colon (sigmoidoscopy or colonoscopy). TREATMENT Most hemorrhoids can be treated at home. However, if symptoms do not seem to be getting better or if you have a lot of rectal bleeding, your caregiver may perform a procedure to help make the hemorrhoids get smaller or remove them completely. Possible treatments include:   Placing a rubber band at the base of the hemorrhoid to cut off the circulation (rubber band ligation).   Injecting a chemical to shrink the hemorrhoid (sclerotherapy).   Using a tool to burn the hemorrhoid (infrared light therapy).   Surgically removing the hemorrhoid (hemorrhoidectomy).   Stapling the hemorrhoid to block blood flow to the tissue (hemorrhoid stapling).  HOME CARE INSTRUCTIONS   Eat foods with fiber, such as whole grains, beans,  nuts, fruits, and vegetables. Ask your doctor about taking products with added fiber in them (fibersupplements).  Increase fluid intake. Drink enough water and fluids to keep your urine clear or pale yellow.   Exercise regularly.   Go to the bathroom when you have the urge to have a bowel movement. Do not wait.   Avoid straining to have bowel movements.   Keep the anal area dry and clean. Use wet toilet paper or moist towelettes after a bowel movement.   Medicated creams and suppositories may be used or applied as directed.   Only take over-the-counter or prescription medicines as directed by your caregiver.   Take warm sitz baths for 15 20 minutes, 3 4 times a day to ease pain and discomfort.   Place ice packs on the hemorrhoids if they are tender and swollen. Using ice packs between sitz baths may be helpful.   Put ice in a plastic bag.   Place a towel between your skin and the bag.   Leave the ice on for 15 20 minutes, 3 4 times a day.   Do not use a donut-shaped pillow or sit on the toilet for long periods. This increases blood pooling and pain.  SEEK MEDICAL CARE IF:  You have increasing pain and swelling that is not controlled by treatment or medicine.  You have uncontrolled bleeding.  You have difficulty or you are unable to have a bowel movement.  You have pain or inflammation outside the area of the hemorrhoids. MAKE SURE YOU:    Understand these instructions.  Will watch your condition.  Will get help right away if you are not doing well or get worse. Document Released: 01/22/2000 Document Revised: 01/11/2012 Document Reviewed: 11/29/2011 Exit[employer] Patient Information 2014 [employer].  

## 2012-12-15 NOTE — ED Provider Notes (Signed)
CSN: [id number]     Arrival date & time 12/15/12  1700 History   First MD Initiated Contact with Patient 12/15/12 1714     Chief Complaint  Patient presents with  . Rectal Pain   (Consider location/radiation/quality/duration/timing/severity/associated sxs/prior Treatment) Patient is a 37 y.o. female presenting with hematochezia. The history is provided by the patient.  Rectal Bleeding Quality:  Bright red Amount:  Scant Duration:  4 days Timing:  Intermittent Chronicity:  New Context: rectal pain   Context: not constipation, not defecation and not rectal injury   Pain details:    Quality:  Sharp   Severity:  Moderate   Timing:  Worse with defecation Relieved by:  Nothing Worsened by:  Defecation (and sitting in certain positions) Associated symptoms: no abdominal pain, no fever and no vomiting     Past Medical History  Diagnosis Date  . Vitamin D deficiency   . H/O pilonidal cyst    History reviewed. No pertinent past surgical history. History reviewed. No pertinent family history. History  Substance Use Topics  . Smoking status: Current Every Day Smoker -- 0.50 packs/day    Types: Cigarettes  . Smokeless tobacco: Never Used  . Alcohol Use: No   OB History   Grav Para Term Preterm Abortions TAB SAB Ect Mult Living                 Review of Systems  Constitutional: Negative for fever.  Gastrointestinal: Positive for hematochezia, anal bleeding and rectal pain. Negative for nausea, vomiting, abdominal pain, diarrhea and constipation.  Musculoskeletal: Negative for back pain.  Skin: Negative for rash and wound.    Allergies  Review of patient's allergies indicates no known allergies.  Home Medications   Current Outpatient Rx  Name  Route  Sig  Dispense  Refill  . azithromycin (ZITHROMAX) 250 MG tablet   Oral   Take 1 tablet (250 mg total) by mouth daily. 1 tab every day until finished.   4 tablet   0   . docusate sodium (COLACE) 100 MG capsule   Oral  Take 1 capsule (100 mg total) by mouth 2 (two) times daily.   60 capsule   0   . guaiFENesin (ROBITUSSIN) 100 MG/5ML liquid   Oral   Take 5-10 mLs (100-200 mg total) by mouth every 4 (four) hours as needed for cough.   60 mL   0   . hydrocortisone (ANUSOL-HC) 2.5 % rectal cream      Apply rectally 2 times daily   28 g   0   . pseudoephedrine (SUDAFED) 120 MG 12 hr tablet   Oral   Take 1 tablet (120 mg total) by mouth every 12 (twelve) hours.   10 tablet   0    BP 139/95  Pulse 91  Temp(Src) 98.8 F (37.1 C) (Oral)  Resp 18  SpO2 100% Physical Exam  Nursing note and vitals reviewed. Constitutional: She appears well-developed and well-nourished.  Non-toxic appearance. She does not have a sickly appearance. No distress.  HENT:  Head: Normocephalic and atraumatic.  Abdominal: Soft. She exhibits no distension. There is no tenderness.  Genitourinary: Rectal exam shows external hemorrhoid and tenderness.  No active bleeding.  Chaperone present  Skin: Skin is warm. No rash noted. She is not diaphoretic. No pallor.    ED Course  Procedures (including critical care time) Labs Review Labs Reviewed - No data to display Imaging Review No results found.  EKG Interpretation   None  MDM   1. External hemorrhoid    Pt with mildly bulging external hemorrhoid at the 6 PM position.  Tender, no active bleeding.  Soft, not firm.  Pt counseled abotu ice packs, steroid rectal cream, stool softeners.  Pt can follow up with PMD or general surgeon in 2 weeks if not improving or for chronic recurrence in the future.      Gavin Pound. Jackelyn Illingworth, MD 12/15/12 1737

## 2012-12-15 NOTE — ED Notes (Addendum)
Per pt has had rectal pain since Thursday. Pt reports small amount of bleeding from area with wiping and raised area around rectum.

## 2013-02-04 ENCOUNTER — Encounter: Payer: Self-pay | Admitting: Obstetrics

## 2013-02-06 ENCOUNTER — Ambulatory Visit: Payer: Self-pay | Admitting: Obstetrics

## 2013-02-07 DIAGNOSIS — E559 Vitamin D deficiency, unspecified: Secondary | ICD-10-CM

## 2013-02-07 HISTORY — DX: Vitamin D deficiency, unspecified: E55.9

## 2013-02-14 ENCOUNTER — Inpatient Hospital Stay (HOSPITAL_COMMUNITY): Payer: Medicaid Other

## 2013-02-14 ENCOUNTER — Encounter (HOSPITAL_COMMUNITY): Payer: Self-pay | Admitting: *Deleted

## 2013-02-14 ENCOUNTER — Inpatient Hospital Stay (HOSPITAL_COMMUNITY)
Admission: AD | Admit: 2013-02-14 | Discharge: 2013-02-14 | Disposition: A | Discharge status: Home / Self Care | Payer: Medicaid Other | Source: Ambulatory Visit | Attending: Obstetrics & Gynecology | Admitting: Obstetrics & Gynecology

## 2013-02-14 DIAGNOSIS — O99330 Smoking (tobacco) complicating pregnancy, unspecified trimester: Secondary | ICD-10-CM | POA: Insufficient documentation

## 2013-02-14 DIAGNOSIS — O4690 Antepartum hemorrhage, unspecified, unspecified trimester: Secondary | ICD-10-CM

## 2013-02-14 DIAGNOSIS — O209 Hemorrhage in early pregnancy, unspecified: Secondary | ICD-10-CM | POA: Insufficient documentation

## 2013-02-14 LAB — WET PREP, GENITAL
Trich, Wet Prep: NONE SEEN
Yeast Wet Prep HPF POC: NONE SEEN

## 2013-02-14 LAB — ABO/RH: ABO/RH(D): A POS

## 2013-02-14 LAB — CBC
HEMATOCRIT: 37 % (ref 36.0–46.0)
HEMOGLOBIN: 12.4 g/dL (ref 12.0–15.0)
MCH: 30 pg (ref 26.0–34.0)
MCHC: 33.5 g/dL (ref 30.0–36.0)
MCV: 89.6 fL (ref 78.0–100.0)
Platelets: 168 10*3/uL (ref 150–400)
RBC: 4.13 MIL/uL (ref 3.87–5.11)
RDW: 13.4 % (ref 11.5–15.5)
WBC: 7.8 10*3/uL (ref 4.0–10.5)

## 2013-02-14 LAB — HCG, QUANTITATIVE, PREGNANCY: hCG, Beta Chain, Quant, S: 426 m[IU]/mL — ABNORMAL HIGH (ref <5)

## 2013-02-14 NOTE — MAU Provider Note (Signed)
History     CSN: [id number]  Arrival date and time: 02/14/13 1657   None     Chief Complaint  Patient presents with  . Vaginal Bleeding   HPI  Emily Saunders is a 38 y.o. G5P3 at [redacted]w[redacted]d who presents today with vaginal bleeding. She states that the bleeding started just before coming in here. She denies any pain at this time. She also reports a hx of ectopic pregnancy in 2005. She said that she did not have surgery for that, but had to "take medication".   Past Medical History  Diagnosis Date  . Vitamin D deficiency   . H/O pilonidal cyst     History reviewed. No pertinent past surgical history.  History reviewed. No pertinent family history.  History  Substance Use Topics  . Smoking status: Current Every Day Smoker -- 0.50 packs/day    Types: Cigarettes  . Smokeless tobacco: Never Used  . Alcohol Use: No    Allergies: No Known Allergies  Prescriptions prior to admission  Medication Sig Dispense Refill  . Prenatal Vit-Fe Fumarate-FA (PRENATAL MULTIVITAMIN) TABS tablet Take 1 tablet by mouth daily at 12 noon.      . Vitamin D, Ergocalciferol, (DRISDOL) 50000 UNITS CAPS capsule Take 50,000 Units by mouth every 7 (seven) days. monday        ROS Physical Exam   Blood pressure 136/87, pulse 80, temperature 98.6 F (37 C), temperature source Oral, resp. rate 18, height 5\' 6"  (1.676 m), weight 97.251 kg (214 lb 6.4 oz), last menstrual period 12/31/2012.  Physical Exam  Nursing note and vitals reviewed. Constitutional: She is oriented to person, place, and time. She appears well-developed and well-nourished. No distress.  Cardiovascular: Normal rate.   Respiratory: Effort normal.  GI: Soft. There is no tenderness.  Genitourinary:   External: no lesion Vagina: small amount of blood in the vagina  Cervix: pink, smooth, no CMT Uterus: NSSC Adnexa: NT   Neurological: She is alert and oriented to person, place, and time.  Skin: Skin is warm and dry.  Psychiatric: She  has a normal mood and affect.    MAU Course  Procedures   Results for orders placed during the hospital encounter of 02/14/13 (from the past 24 hour(s))  CBC     Status: None   Collection Time    02/14/13  5:10 PM      Result Value Range   WBC 7.8  4.0 - 10.5 K/uL   RBC 4.13  3.87 - 5.11 MIL/uL   Hemoglobin 12.4  12.0 - 15.0 g/dL   HCT 37.0  36.0 - 46.0 %   MCV 89.6  78.0 - 100.0 fL   MCH 30.0  26.0 - 34.0 pg   MCHC 33.5  30.0 - 36.0 g/dL   RDW 13.4  11.5 - 15.5 %   Platelets 168  150 - 400 K/uL  ABO/RH     Status: None   Collection Time    02/14/13  5:10 PM      Result Value Range   ABO/RH(D) A POS    HCG, QUANTITATIVE, PREGNANCY     Status: Abnormal   Collection Time    02/14/13  5:10 PM      Result Value Range   hCG, Beta Chain, Quant, S 426 (*) <5 mIU/mL  WET PREP, GENITAL     Status: Abnormal   Collection Time    02/14/13  6:50 PM      Result Value Range  Yeast Wet Prep HPF POC NONE SEEN  NONE SEEN   Trich, Wet Prep NONE SEEN  NONE SEEN   Clue Cells Wet Prep HPF POC FEW (*) NONE SEEN   WBC, Wet Prep HPF POC FEW (*) NONE SEEN   US Ob Comp Less 14 Wks  02/14/2013   CLINICAL DATA:  Bleeding  EXAM: TRANSVAGINAL OB ULTRASOUND; OBSTETRIC <14 WK ULTRASOUND  TECHNIQUE: Transvaginal ultrasound was performed for complete evaluation of the gestation as well as the maternal uterus, adnexal regions, and pelvic cul-de-sac.  COMPARISON:  None.  FINDINGS: Intrauterine gestational sac: Visualized/normal in shape.  Yolk sac:  Present.  Embryo:  Not visualized.  MSD: 5.7  mm   5 w   1  d  Maternal uterus/adnexae: Bilateral ovaries are normal in size and echogenicity. There is a 3 cm anechoic left ovarian mass likely representing a cyst. There is a 2 cm anechoic mass adjacent to the right ovary which may represent a paraovarian cyst versus an exophytic cyst.  IMPRESSION: Probable early intrauterine gestational sac, but no fetal pole or cardiac activity yet visualized. Recommend follow-up  quantitative B-HCG levels and follow-up US in 14 days to confirm and assess viability. This recommendation follows SRU consensus guidelines: Diagnostic Criteria for Nonviable Pregnancy Early in the First Trimester. [employer] Med 2013; 194:1740-81.   Electronically Signed   By: Kathreen Devoid   On: 02/14/2013 18:24   US Ob Transvaginal  02/14/2013   CLINICAL DATA:  Bleeding  EXAM: TRANSVAGINAL OB ULTRASOUND; OBSTETRIC <14 WK ULTRASOUND  TECHNIQUE: Transvaginal ultrasound was performed for complete evaluation of the gestation as well as the maternal uterus, adnexal regions, and pelvic cul-de-sac.  COMPARISON:  None.  FINDINGS: Intrauterine gestational sac: Visualized/normal in shape.  Yolk sac:  Present.  Embryo:  Not visualized.  MSD: 5.7  mm   5 w   1  d  Maternal uterus/adnexae: Bilateral ovaries are normal in size and echogenicity. There is a 3 cm anechoic left ovarian mass likely representing a cyst. There is a 2 cm anechoic mass adjacent to the right ovary which may represent a paraovarian cyst versus an exophytic cyst.  IMPRESSION: Probable early intrauterine gestational sac, but no fetal pole or cardiac activity yet visualized. Recommend follow-up quantitative B-HCG levels and follow-up US in 14 days to confirm and assess viability. This recommendation follows SRU consensus guidelines: Diagnostic Criteria for Nonviable Pregnancy Early in the First Trimester. [employer] Med 2013; 448:1856-31.   Electronically Signed   By: Kathreen Devoid   On: 02/14/2013 18:24     Assessment and Plan   1. Vaginal bleeding in pregnancy, first trimester    Bleeding precautions Ectopic precautions   Follow-up Information   Follow up with [facility] In 2 days.   Contact information:   [id number] [place] [address] [phone]       Mathis Bud 02/14/2013, 7:11 PM

## 2013-02-14 NOTE — Discharge Instructions (Signed)
Ectopic Pregnancy An ectopic pregnancy is when the fertilized egg attaches (implants) outside the uterus. Most ectopic pregnancies occur in the fallopian tube. Rarely do ectopic pregnancies occur on the ovary, intestine, pelvis, or cervix. An ectopic pregnancy does not have the ability to develop into a normal, healthy baby.  A ruptured ectopic pregnancy is one in which the fallopian tube gets torn or bursts and results in internal bleeding. Often there is intense abdominal pain, and sometimes, vaginal bleeding. Having an ectopic pregnancy can be a life-threatening experience. If left untreated, this dangerous condition can lead to a blood transfusion, abdominal surgery, or even death. CAUSES  Damage to the fallopian tubes is the suspected cause in most ectopic pregnancies.  RISK FACTORS Depending on your circumstances, the amount of risk of having an ectopic pregnancy will vary. There are 3 categories that may help you identify whether you are potentially at risk. High Risk  You have gone through infertility treatment.  You have had a previous ectopic pregnancy.  You have had previous tubal surgery.  You have had previous surgery to have the fallopian tubes tied (tubal ligation).  You have tubal problems or diseases.  You have been exposed to DES. DES is a medicine that was used until 1971 and had effects on babies whose mothers took the medicine.  You become pregnant while using an intrauterine device (IUD) for birth control. Moderate Risk  You have a history of infertility.  You have a history of a sexually transmitted infection (STI).  You have a history of pelvic inflammatory disease (PID).  You have scarring from endometriosis.  You have multiple sexual partners.  You smoke. Low Risk  You have had previous pelvic surgery.  You use vaginal douching.  You became sexually active before 38 years of age. SYMPTOMS  An ectopic pregnancy should be suspected in anyone who  has missed a period and has abdominal pain or bleeding.  You may experience normal pregnancy symptoms, such as:  Nausea.  Tiredness.  Breast tenderness.  Symptoms that are not normal include:  Pain with intercourse.  Irregular vaginal bleeding or spotting.  Cramping or pain on one side, or in the lower abdomen.  Fast heartbeat.  Passing out while having a bowel movement.  Symptoms of a ruptured ectopic pregnancy and internal bleeding may include:  Sudden, severe pain in the abdomen and pelvis.  Dizziness or fainting.  Pain in the shoulder area. DIAGNOSIS  Tests that may be performed include:  A pregnancy test.  An ultrasound.  Testing the specific level of pregnancy hormone in the bloodstream.  Taking a sample of uterus tissue (dilation and curettage, D&C).  Surgery to perform a visual exam of the inside of the abdomen using a lighted tube (laparoscopy). TREATMENT  An injection of methotrexate medicine may be given. This is given if:  The diagnosis is made early.  The fallopian tube has not ruptured.  You are considered to be a good candidate for the medicine. Usually, pregnancy hormone blood levels are checked after methotrexate treatment. This is to be sure the medicine is effective. It may take 4 to 6 weeks for the pregnancy to be absorbed (though most pregnancies will be absorbed by 3 weeks). Surgical treatment may be needed. A lighted tube (laparoscope) is used to remove the tubal pregnancy. If severe internal bleeding occurs, a cut (incision) may be made in the lower abdomen (laparotomy), and the ectopic pregnancy is removed. This stops the bleeding. Part of the fallopian tube, or  the whole tube, may be removed as well (salpingectomy). After surgery, pregnancy hormone tests may be done to be sure there is no pregnancy tissue left. You may receive a RhoGAM shot if you are Rh negative and the father is Rh positive, or if you do not know the Rh type of the father.  This is to prevent problems with any future pregnancy. °SEEK IMMEDIATE MEDICAL CARE IF:  °You have any symptoms of an ectopic pregnancy. This is a medical emergency. °Document Released: 03/03/2004 Document Revised: 04/18/2011 Document Reviewed: 08/23/2012 °[employer]® Patient Information ©2014 [employer]. ° °

## 2013-02-14 NOTE — MAU Provider Note (Signed)
Attestation of Attending Supervision of Advanced Practitioner (CNM/NP): Evaluation and management procedures were performed by the Advanced Practitioner under my supervision and collaboration.  I have reviewed the Advanced Practitioner's note and chart, and I agree with the management and plan.  HARRAWAY-SMITH, Clemon Devaul 10:47 PM

## 2013-02-14 NOTE — MAU Note (Signed)
Pt reports she had vaginal bleeding when she wiped this afternoon. Denies pain or cramping. Had intercourse 2 nights ago.

## 2013-02-15 LAB — GC/CHLAMYDIA PROBE AMP
CT Probe RNA: NEGATIVE
GC PROBE AMP APTIMA: NEGATIVE

## 2013-02-16 ENCOUNTER — Inpatient Hospital Stay (HOSPITAL_COMMUNITY)
Admission: AD | Admit: 2013-02-16 | Discharge: 2013-02-16 | Disposition: A | Discharge status: Home / Self Care | Payer: Medicaid Other | Source: Ambulatory Visit | Attending: Obstetrics | Admitting: Obstetrics

## 2013-02-16 ENCOUNTER — Encounter (HOSPITAL_COMMUNITY): Payer: Self-pay

## 2013-02-16 DIAGNOSIS — O99330 Smoking (tobacco) complicating pregnancy, unspecified trimester: Secondary | ICD-10-CM | POA: Insufficient documentation

## 2013-02-16 DIAGNOSIS — O039 Complete or unspecified spontaneous abortion without complication: Secondary | ICD-10-CM | POA: Insufficient documentation

## 2013-02-16 LAB — HCG, QUANTITATIVE, PREGNANCY: HCG, BETA CHAIN, QUANT, S: 70 m[IU]/mL — AB (ref <5)

## 2013-02-16 NOTE — MAU Note (Signed)
Pt states here for repeat BHCG, still having same amount of bleeding, denies pain today, did have some cramping yesterday. Changing pad q 4 hours. Did see quarter sized clot this am, then a few smaller clots after that. No clots noted since then.

## 2013-02-16 NOTE — MAU Provider Note (Signed)
  History     CSN: [id number]  Arrival date and time: 02/16/13 1746  Seen by provider at [facility]      Chief Complaint  Patient presents with  . Follow-Up [facility]    HPI  Emily Saunders 38 y.o. [redacted]w[redacted]d   Seen in MAU on 02-14-13 for bleeding in pregnancy.  Notes reviewed.  Returns today for repeat quant.  OB History   Grav Para Term Preterm Abortions TAB SAB Ect Mult Living   5 3              Past Medical History  Diagnosis Date  . Vitamin D deficiency   . H/O pilonidal cyst     Past Surgical History  Procedure Laterality Date  . No past surgeries      History reviewed. No pertinent family history.  History  Substance Use Topics  . Smoking status: Current Every Day Smoker -- 0.50 packs/day    Types: Cigarettes  . Smokeless tobacco: Never Used  . Alcohol Use: No    Allergies: No Known Allergies  Prescriptions prior to admission  Medication Sig Dispense Refill  . Prenatal Vit-Fe Fumarate-FA (PRENATAL MULTIVITAMIN) TABS tablet Take 1 tablet by mouth daily at 12 noon.      . Vitamin D, Ergocalciferol, (DRISDOL) 50000 UNITS CAPS capsule Take 50,000 Units by mouth every 7 (seven) days. monday        Review of Systems  Constitutional: Negative for fever.  Gastrointestinal: Negative for nausea, vomiting and abdominal pain.  Genitourinary:       Small amount of vaginal bleeding.   Physical Exam   Blood pressure 131/89, pulse 87, temperature 98.2 F (36.8 C), temperature source Oral, resp. rate 18, height 5\' 6"  (1.676 m), weight 211 lb 2 oz (95.766 kg), last menstrual period 12/31/2012.  Physical Exam  Nursing note and vitals reviewed. Constitutional: She is oriented to person, place, and time. She appears well-developed and well-nourished. No distress.  HENT:  Head: Normocephalic.  Eyes: EOM are normal.  Neck: Neck supple.  Musculoskeletal: Normal range of motion.  Neurological: She is alert and oriented to person, place, and time.  Skin: Skin is warm and dry.   Psychiatric: She has a normal mood and affect.    MAU Course  Procedures  MDM 1840 consult with Dr. Ihor Dow re: plan of care  Results for Emily Saunders (MRN [id number]) as of 02/16/2013 18:41  Ref. Range 10/27/2012 16:47 02/14/2013 17:10 02/14/2013 18:08 02/14/2013 18:50 02/16/2013 17:55  hCG, Beta Chain, Quant, S Latest Range: <5 mIU/mL  426 (H)   70 (H)   Assessment and Plan  Miscarriage  Plan Message sent to GYN clinic to follow up in one week. Pelvic rest Return if having severe pain or heavy bleeding.   Emily Saunders 02/16/2013, 6:30 PM

## 2013-02-16 NOTE — Discharge Instructions (Signed)
Expect the clinic to call and schedule an appointment to be seen downstairs. No sex, no tampons, no douching.

## 2013-02-16 NOTE — MAU Provider Note (Signed)
Attestation of Attending Supervision of Advanced Practitioner (CNM/NP): Evaluation and management procedures were performed by the Advanced Practitioner under my supervision and collaboration.  I have reviewed the Advanced Practitioner's note and chart, and I agree with the management and plan.  HARRAWAY-SMITH, Jamisen Duerson 8:36 PM

## 2013-02-16 NOTE — MAU Note (Signed)
Pt left before RN could give miscarriage support packet.

## 2013-02-18 ENCOUNTER — Ambulatory Visit: Payer: Self-pay | Admitting: Obstetrics

## 2013-02-25 ENCOUNTER — Ambulatory Visit (INDEPENDENT_AMBULATORY_CARE_PROVIDER_SITE_OTHER): Payer: Medicaid Other | Admitting: Obstetrics and Gynecology

## 2013-02-25 ENCOUNTER — Encounter: Payer: Self-pay | Admitting: Obstetrics and Gynecology

## 2013-02-25 VITALS — BP 129/90 | HR 88 | Temp 98.1°F | Wt 211.8 lb

## 2013-02-25 DIAGNOSIS — O039 Complete or unspecified spontaneous abortion without complication: Secondary | ICD-10-CM

## 2013-02-25 NOTE — Progress Notes (Signed)
Patient ID: Emily Saunders, female   DOB: [birth year], 38 y.o.   MRN: [id number] 38 yo G5P3 diagnosed with a spontaneous abortionon 1/10 presenting today for follow up. Patient reports persistent vaginal bleeding for 5-6 days. No further bleeding or cramping. This was not a planned pregnancy but a desired pregnancy. Patient and her partner desire to try again.  GENERAL: Well-developed, well-nourished female in no acute distress.  ABDOMEN: Soft, nontender, nondistended.Obese PELVIC: Normal external female genitalia. Vagina is pink and rugated.  Normal discharge. Normal appearing cervix. Uterus is normal in size. No adnexal mass or tenderness. EXTREMITIES: No cyanosis, clubbing, or edema, 2+ distal pulses.    A/P 38 yo s/p spontaneous abortion - Patient medically cleared to resume all her activities - Quant HCG today - Advised patient to wait 3 normal cycles before trying to conceive - Advised to continue taking PNV and to start an exercise regimen  - RTC prn

## 2013-02-26 LAB — HCG, QUANTITATIVE, PREGNANCY: hCG, Beta Chain, Quant, S: 3 m[IU]/mL

## 2013-02-27 ENCOUNTER — Ambulatory Visit: Payer: Self-pay | Admitting: Obstetrics

## 2013-04-30 ENCOUNTER — Ambulatory Visit: Payer: Medicaid Other | Admitting: Obstetrics

## 2013-05-14 ENCOUNTER — Ambulatory Visit (INDEPENDENT_AMBULATORY_CARE_PROVIDER_SITE_OTHER): Payer: Medicaid Other | Admitting: Obstetrics

## 2013-05-14 ENCOUNTER — Encounter: Payer: Self-pay | Admitting: Obstetrics

## 2013-05-14 VITALS — BP 131/87 | HR 76 | Temp 99.1°F | Ht 66.0 in | Wt 219.0 lb

## 2013-05-14 DIAGNOSIS — D649 Anemia, unspecified: Secondary | ICD-10-CM

## 2013-05-14 DIAGNOSIS — O039 Complete or unspecified spontaneous abortion without complication: Secondary | ICD-10-CM

## 2013-05-14 MED ORDER — VITAFOL-ONE 29-1-200 MG PO CAPS: 1.0000 | ORAL_CAPSULE | Freq: Every day | ORAL | Status: DC

## 2013-05-14 NOTE — Progress Notes (Signed)
Subjective:     Emily Saunders is a 38 y.o. female here for a routine exam.  Current complaints: Patient is in the office today for a follow up visit following a miscarriage. Patient states she is having a cycle every 3 weeks now. Patient states the cycles will only last 2-3 days. Patient states she needs a refill on her prenatal vitamin and vitamin D.  Gynecologic History Patient's last menstrual period was 04/27/2013. Contraception: none Last Pap: 05/31/2005. Results were: normal  Obstetric History OB History  Gravida Para Term Preterm AB SAB TAB Ectopic Multiple Living  5 3 3  2 1  1  3     # Outcome Date GA Lbr Len/2nd Weight Sex Delivery Anes PTL Lv  5 SAB 02/14/13        N     Comments: System Generated. Please review and update pregnancy details.  4 ECT 2005        N  3 TRM 04/04/01 [redacted]w[redacted]d  7 lb (3.175 kg) M SVD EPI  Y  2 TRM 01/15/97 [redacted]w[redacted]d  7 lb (3.175 kg) F SVD EPI  Y  1 TRM 04/11/93 [redacted]w[redacted]d  7 lb (3.175 kg) F LTCS EPI  Y       The following portions of the patient's history were reviewed and updated as appropriate: allergies, current medications, past family history, past medical history, past social history, past surgical history and problem list.  Review of Systems A comprehensive review of systems was negative.    Objective:    General appearance: alert and no distress Abdomen: normal findings: soft, non-tender Pelvic: cervix normal in appearance, external genitalia normal, no adnexal masses or tenderness, no cervical motion tenderness, rectovaginal septum normal, uterus normal size, shape, and consistency and vagina normal without discharge      Assessment:    Healthy female exam.   H/O anemia  Contraceptive counseling done along with preconception counseling.   Plan:    Education reviewed: safe sex/STD prevention and weight bearing exercise. Contraception: Contraceptive options discussed.. Follow up in: several months. PNV's Rx

## 2013-05-27 ENCOUNTER — Encounter: Payer: Self-pay | Admitting: Obstetrics

## 2013-05-27 DIAGNOSIS — Z862 Personal history of diseases of the blood and blood-forming organs and certain disorders involving the immune mechanism: Secondary | ICD-10-CM | POA: Insufficient documentation

## 2013-05-27 DIAGNOSIS — D649 Anemia, unspecified: Secondary | ICD-10-CM | POA: Insufficient documentation

## 2013-05-27 DIAGNOSIS — O039 Complete or unspecified spontaneous abortion without complication: Secondary | ICD-10-CM | POA: Insufficient documentation

## 2013-08-17 ENCOUNTER — Emergency Department (HOSPITAL_COMMUNITY)
Admission: EM | Admit: 2013-08-17 | Discharge: 2013-08-17 | Disposition: A | Discharge status: Home / Self Care | Payer: Medicaid Other | Source: Home / Self Care | Attending: Emergency Medicine | Admitting: Emergency Medicine

## 2013-08-17 ENCOUNTER — Encounter (HOSPITAL_COMMUNITY): Payer: Self-pay | Admitting: Emergency Medicine

## 2013-08-17 DIAGNOSIS — J039 Acute tonsillitis, unspecified: Secondary | ICD-10-CM

## 2013-08-17 LAB — POCT RAPID STREP A: Streptococcus, Group A Screen (Direct): NEGATIVE

## 2013-08-17 LAB — POCT INFECTIOUS MONO SCREEN: Mono Screen: NEGATIVE

## 2013-08-17 MED ORDER — AMOXICILLIN 500 MG PO CAPS: 500.0000 mg | ORAL_CAPSULE | Freq: Three times a day (TID) | ORAL | Status: DC

## 2013-08-17 MED ORDER — PREDNISONE 20 MG PO TABS: 20.0000 mg | ORAL_TABLET | Freq: Two times a day (BID) | ORAL | Status: DC

## 2013-08-17 NOTE — ED Notes (Signed)
Patient complain of sore throat past two day Denies any temperature

## 2013-08-17 NOTE — Discharge Instructions (Signed)
Tonsillitis Tonsillitis is an infection of the throat that causes the tonsils to become red, tender, and swollen. Tonsils are collections of lymphoid tissue at the back of the throat. Each tonsil has crevices (crypts). Tonsils help fight nose and throat infections and keep infection from spreading to other parts of the body for the first 18 months of life.  CAUSES Sudden (acute) tonsillitis is usually caused by infection with streptococcal bacteria. Long-lasting (chronic) tonsillitis occurs when the crypts of the tonsils become filled with pieces of food and bacteria, which makes it easy for the tonsils to become repeatedly infected. SYMPTOMS  Symptoms of tonsillitis include:  A sore throat, with possible difficulty swallowing.  White patches on the tonsils.  Fever.  Tiredness.  New episodes of snoring during sleep, when you did not snore before.  Small, foul-smelling, yellowish-white pieces of material (tonsilloliths) that you occasionally cough up or spit out. The tonsilloliths can also cause you to have bad breath. DIAGNOSIS Tonsillitis can be diagnosed through a physical exam. Diagnosis can be confirmed with the results of lab tests, including a throat culture. TREATMENT  The goals of tonsillitis treatment include the reduction of the severity and duration of symptoms and prevention of associated conditions. Symptoms of tonsillitis can be improved with the use of steroids to reduce the swelling. Tonsillitis caused by bacteria can be treated with antibiotic medicines. Usually, treatment with antibiotic medicines is started before the cause of the tonsillitis is known. However, if it is determined that the cause is not bacterial, antibiotic medicines will not treat the tonsillitis. If attacks of tonsillitis are severe and frequent, your health care provider may recommend surgery to remove the tonsils (tonsillectomy). HOME CARE INSTRUCTIONS   Rest as much as possible and get plenty of  sleep.  Drink plenty of fluids. While the throat is very sore, eat soft foods or liquids, such as sherbet, soups, or instant breakfast drinks.  Eat frozen ice pops.  Gargle with a warm or cold liquid to help soothe the throat. Mix 1/4 teaspoon of salt and 1/4 teaspoon of baking soda in in 8 oz of water. SEEK MEDICAL CARE IF:   Large, tender lumps develop in your neck.  A rash develops.  A green, yellow-brown, or bloody substance is coughed up.  You are unable to swallow liquids or food for 24 hours.  You notice that only one of the tonsils is swollen. SEEK IMMEDIATE MEDICAL CARE IF:   You develop any new symptoms such as vomiting, severe headache, stiff neck, chest pain, or trouble breathing or swallowing.  You have severe throat pain along with drooling or voice changes.  You have severe pain, unrelieved with recommended medications.  You are unable to fully open the mouth.  You develop redness, swelling, or severe pain anywhere in the neck.  You have a fever. MAKE SURE YOU:   Understand these instructions.  Will watch your condition.  Will get help right away if you are not doing well or get worse. Document Released: 11/03/2004 Document Revised: 01/29/2013 Document Reviewed: 07/13/2012 [facility] [employer] [facility] Patient Information 2015 [place], [place]. This information is not intended to replace advice given to you by your health care provider. Make sure you discuss any questions you have with your health care provider.

## 2013-08-17 NOTE — ED Provider Notes (Signed)
Chief Complaint   Chief Complaint  Patient presents with  . Sore Throat    History of Present Illness   Emily Saunders is a 38 year old female who has had a three-day history of sore throat, pain on swallowing, and swollen, tender anterior cervical lymph nodes. She denies fever, chills, headache, nasal congestion, or rhinorrhea. No coughing or GI symptoms. No known exposure to strep.   Review of Systems   Other than as noted above, the patient denies any of the following symptoms. Systemic:  No fever, chills, sweats, myalgias, or headache. Eye:  No redness, pain or drainage. ENT:  No earache, nasal congestion, sneezing, rhinorrhea, sinus pressure, sinus pain, or post nasal drip. Lungs:  No cough, sputum production, wheezing, shortness of breath, or chest pain. GI:  No abdominal pain, nausea, vomiting, or diarrhea. Skin:  No rash.  [place]   Past medical history, family history, social history, meds, and allergies were reviewed.   Physical Exam     Vital signs:  BP 149/102  Pulse 98  Temp(Src) 98.5 F (36.9 C) (Oral)  Resp 18  SpO2 96% General:  Alert, in no distress. Phonation was normal, no drooling, and patient was able to handle secretions well.  Eye:  No conjunctival injection or drainage. Lids were normal. ENT:  TMs and canals were normal, without erythema or inflammation.  Nasal mucosa was clear and uncongested, without drainage.  Mucous membranes were moist.  Exam of pharynx reveals tonsils to be moderately enlarged with spots of white exudate.  There were no oral ulcerations or lesions. There was no bulging of the tonsillar pillars, and the uvula was midline. Neck:  Supple, no adenopathy, tenderness or mass. Lungs:  No respiratory distress.  Lungs were clear to auscultation, without wheezes, rales or rhonchi.  Breath sounds were clear and equal bilaterally.  Heart:  Regular rhythm, without gallops, murmers or rubs. Skin:  Clear, warm, and dry, without rash or  lesions.  Labs   Results for orders placed during the hospital encounter of 08/17/13  POCT RAPID STREP A ([facility] CARE ONLY)      Result Value Ref Range   Streptococcus, Group A Screen (Direct) NEGATIVE  NEGATIVE  POCT INFECTIOUS MONO SCREEN      Result Value Ref Range   Mono Screen NEGATIVE  NEGATIVE   Assessment   The encounter diagnosis was Tonsillitis.  There is no evidence of a peritonsillar abscess, retropharyngeal abscess, or epiglottitis.    Plan     1.  Meds:  The following meds were prescribed:   Discharge Medication List as of 08/17/2013  6:08 PM    START taking these medications   Details  amoxicillin (AMOXIL) 500 MG capsule Take 1 capsule (500 mg total) by mouth 3 (three) times daily., Starting 08/17/2013, Until Discontinued, Normal    predniSONE (DELTASONE) 20 MG tablet Take 1 tablet (20 mg total) by mouth 2 (two) times daily., Starting 08/17/2013, Until Discontinued, Normal        2.  Patient Education/Counseling:  The patient was given appropriate handouts, self care instructions, and instructed in symptomatic relief, including hot saline gargles, throat lozenges, infectious precautions, and need to trade out toothbrush.    3.  Follow up:  The patient was told to follow up here if no better in 3 to 4 days, or sooner if becoming worse in any way, and given some red flag symptoms such as difficulty swallowing or breathing which would prompt immediate return.  Harden Mo, MD 08/17/13 [phone]

## 2013-08-19 LAB — CULTURE, GROUP A STREP

## 2013-08-26 ENCOUNTER — Ambulatory Visit (INDEPENDENT_AMBULATORY_CARE_PROVIDER_SITE_OTHER): Payer: Medicaid Other | Admitting: Obstetrics

## 2013-08-26 ENCOUNTER — Other Ambulatory Visit: Payer: Self-pay | Admitting: Obstetrics

## 2013-08-26 ENCOUNTER — Encounter: Payer: Self-pay | Admitting: Obstetrics

## 2013-08-26 VITALS — BP 136/96 | HR 80 | Temp 98.0°F | Ht 66.0 in | Wt 230.0 lb

## 2013-08-26 DIAGNOSIS — N6452 Nipple discharge: Secondary | ICD-10-CM

## 2013-08-26 DIAGNOSIS — N6459 Other signs and symptoms in breast: Secondary | ICD-10-CM

## 2013-08-26 NOTE — Progress Notes (Signed)
Subjective:     Emily Saunders is a 38 y.o. female here for a routine exam.  Current complaints: Nipple discharge, bilaterally.    Personal health questionnaire:  Is patient Ashkenazi Jewish, have a family history of breast and/or ovarian cancer: no Is there a family history of uterine cancer diagnosed at age < 79, gastrointestinal cancer, urinary tract cancer, family member who is a Field seismologist syndrome-associated carrier: no Is the patient overweight and hypertensive, family history of diabetes, personal history of gestational diabetes or PCOS: no Is patient over 73, have PCOS,  family history of premature CHD under age 58, diabetes, smoke, have hypertension or peripheral artery disease:  no  The HPI was reviewed and explored in further detail by the provider. Gynecologic History Patient's last menstrual period was 08/06/2013. Contraception: none Last Pap: 2015. Results were: normal Last mammogram: na. Results were: n/a  Obstetric History OB History  Gravida Para Term Preterm AB SAB TAB Ectopic Multiple Living  5 3 3  2 1  1  3     # Outcome Date GA Lbr Len/2nd Weight Sex Delivery Anes PTL Lv  5 SAB 02/14/13        N     Comments: System Generated. Please review and update pregnancy details.  4 ECT 2005        N  3 TRM 04/04/01 [redacted]w[redacted]d  7 lb (3.175 kg) M SVD EPI  Y  2 TRM 01/15/97 [redacted]w[redacted]d  7 lb (3.175 kg) F SVD EPI  Y  1 TRM 04/11/93 [redacted]w[redacted]d  7 lb (3.175 kg) F LTCS EPI  Y      Past Medical History  Diagnosis Date  . Vitamin D deficiency   . H/O pilonidal cyst     Past Surgical History  Procedure Laterality Date  . No past surgeries      Current outpatient prescriptions:amoxicillin (AMOXIL) 500 MG capsule, Take 1 capsule (500 mg total) by mouth 3 (three) times daily., Disp: 30 capsule, Rfl: 0;  Prenatal Vit-FePoly-FA-DHA (VITAFOL-ONE) 29-1-200 MG CAPS, Take 1 capsule by mouth daily before breakfast., Disp: 30 capsule, Rfl: 11 No Known Allergies  History  Substance Use Topics  .  Smoking status: Current Some Day Smoker -- 0.50 packs/day    Types: Cigarettes  . Smokeless tobacco: Never Used  . Alcohol Use: No    Family History  Problem Relation Age of Onset  . Hypertension Mother   . Diabetes Mother   . Asthma Mother   . COPD Mother   . Depression Mother   . Miscarriages / [place] Mother   . Vision loss Mother   . Parkinson's disease Father       Review of Systems  Constitutional: negative for fatigue and weight loss Respiratory: negative for cough and wheezing Cardiovascular: negative for chest pain, fatigue and palpitations Gastrointestinal: negative for abdominal pain and change in bowel habits Musculoskeletal:negative for myalgias Neurological: negative for gait problems and tremors Behavioral/Psych: negative for abusive relationship, depression Endocrine: negative for temperature intolerance   Genitourinary:negative for abnormal menstrual periods, genital lesions, hot flashes, sexual problems and vaginal discharge Integument/breast: negative for breast lump, breast tenderness, and skin lesion(s).  Positive for nipple discharge.    Objective:       BP 136/96  Pulse 80  Temp(Src) 98 F (36.7 C)  Ht 5\' 6"  (1.676 m)  Wt 230 lb (104.327 kg)  BMI 37.14 kg/m2  LMP 08/06/2013  PE:        Breasts:  Negative for masses, tenderness  or discharge.  Lab Review Urine pregnancy test Labs reviewed yes    Assessment:    Nipple discharge, bilateral.   Plan:    Education reviewed: self breast exams and management of nipple discharge. Follow up in: 2 weeks. [facility]   No orders of the defined types were placed in this encounter.   Orders Placed This Encounter  Procedures  . MM Digital Diagnostic Bilat    CO-SIGN REQ 7/20    PF: NONE    NO NEEDS    JB/BARB   [id number]    MEDICAID     Order Specific Question:  Reason for Exam (SYMPTOM  OR DIAGNOSIS REQUIRED)    Answer:  Nipple discharge    Order Specific Question:  Is  the patient pregnant?    Answer:  No    Order Specific Question:  Preferred imaging location?    Answer:  [facility]  . US Breast Bilateral    Order Specific Question:  Reason for Exam (SYMPTOM  OR DIAGNOSIS REQUIRED)    Answer:  Nipple discharge    Order Specific Question:  Preferred imaging location?    Answer:  [facility]

## 2013-08-29 ENCOUNTER — Encounter: Payer: Self-pay | Admitting: Obstetrics

## 2013-09-04 ENCOUNTER — Other Ambulatory Visit: Payer: Medicaid Other

## 2013-09-05 ENCOUNTER — Other Ambulatory Visit: Payer: Self-pay | Admitting: Obstetrics

## 2013-09-05 ENCOUNTER — Ambulatory Visit
Admission: RE | Admit: 2013-09-05 | Discharge: 2013-09-05 | Disposition: A | Discharge status: Home / Self Care | Payer: Medicaid Other | Source: Ambulatory Visit | Attending: Obstetrics | Admitting: Obstetrics

## 2013-09-05 ENCOUNTER — Ambulatory Visit: Admission: RE | Admit: 2013-09-05 | Payer: Medicaid Other | Source: Ambulatory Visit

## 2013-09-05 DIAGNOSIS — N6452 Nipple discharge: Secondary | ICD-10-CM

## 2013-09-09 ENCOUNTER — Ambulatory Visit: Payer: Medicaid Other | Admitting: Obstetrics

## 2013-10-18 ENCOUNTER — Encounter (HOSPITAL_COMMUNITY): Payer: Self-pay | Admitting: Emergency Medicine

## 2013-10-18 ENCOUNTER — Emergency Department (HOSPITAL_COMMUNITY): Payer: Medicaid Other

## 2013-10-18 ENCOUNTER — Emergency Department (HOSPITAL_COMMUNITY)
Admission: EM | Admit: 2013-10-18 | Discharge: 2013-10-19 | Disposition: A | Discharge status: Home / Self Care | Payer: Medicaid Other | Attending: Emergency Medicine | Admitting: Emergency Medicine

## 2013-10-18 DIAGNOSIS — Z862 Personal history of diseases of the blood and blood-forming organs and certain disorders involving the immune mechanism: Secondary | ICD-10-CM | POA: Insufficient documentation

## 2013-10-18 DIAGNOSIS — Z8639 Personal history of other endocrine, nutritional and metabolic disease: Secondary | ICD-10-CM | POA: Insufficient documentation

## 2013-10-18 DIAGNOSIS — M25512 Pain in left shoulder: Secondary | ICD-10-CM

## 2013-10-18 DIAGNOSIS — Z79899 Other long term (current) drug therapy: Secondary | ICD-10-CM | POA: Insufficient documentation

## 2013-10-18 DIAGNOSIS — M25519 Pain in unspecified shoulder: Secondary | ICD-10-CM | POA: Diagnosis not present

## 2013-10-18 DIAGNOSIS — Z872 Personal history of diseases of the skin and subcutaneous tissue: Secondary | ICD-10-CM | POA: Diagnosis not present

## 2013-10-18 DIAGNOSIS — F17200 Nicotine dependence, unspecified, uncomplicated: Secondary | ICD-10-CM | POA: Diagnosis not present

## 2013-10-18 MED ORDER — TRAMADOL HCL 50 MG PO TABS: 50.0000 mg | ORAL_TABLET | Freq: Four times a day (QID) | ORAL | Status: DC | PRN

## 2013-10-18 MED ORDER — TRAMADOL HCL 50 MG PO TABS
50.0000 mg | ORAL_TABLET | Freq: Once | ORAL | Status: AC
  Administered 2013-10-19: 50 mg via ORAL
  Filled 2013-10-18: qty 1

## 2013-10-18 MED ORDER — CYCLOBENZAPRINE HCL 10 MG PO TABS: 10.0000 mg | ORAL_TABLET | Freq: Two times a day (BID) | ORAL | Status: DC | PRN

## 2013-10-18 NOTE — ED Notes (Addendum)
Pt reports left shoulder pain that started on Tuesday, which has worsened over the pas few days. Pt states that her ROM in the arm is difficult, pt is able to raise arm to the level of the shoulder. Pt denies injury. Pt is A/O x4, in NAD, and vials are WDL.

## 2013-10-18 NOTE — ED Provider Notes (Signed)
CSN: [id number]     Arrival date & time 10/18/13  2104 History   First MD Initiated Contact with Patient 10/18/13 2234     Chief Complaint  Patient presents with  . Shoulder Pain     (Consider location/radiation/quality/duration/timing/severity/associated sxs/prior Treatment) HPI  Patient to the ER with complaints of left shoulder pain that started on Tuesday while driving. She was using that arm when she immediately started to get sharp shooting pains that has been constant. It worsens with movement of the shoulder. She denies injury. No weakness or numbness. No neck pain, headaches, fevers.  Past Medical History  Diagnosis Date  . Vitamin D deficiency   . H/O pilonidal cyst    Past Surgical History  Procedure Laterality Date  . No past surgeries     Family History  Problem Relation Age of Onset  . Hypertension Mother   . Diabetes Mother   . Asthma Mother   . COPD Mother   . Depression Mother   . Miscarriages / [place] Mother   . Vision loss Mother   . Parkinson's disease Father    History  Substance Use Topics  . Smoking status: Current Some Day Smoker -- 0.50 packs/day    Types: Cigarettes  . Smokeless tobacco: Never Used  . Alcohol Use: No   OB History   Grav Para Term Preterm Abortions TAB SAB Ect Mult Living   5 3 3  2  1 1  3      Review of Systems  Musculoskeletal: Positive for arthralgias.  All other systems reviewed and are negative.     Allergies  Review of patient's allergies indicates no known allergies.  Home Medications   Prior to Admission medications   Medication Sig Start Date End Date Taking? Authorizing Provider  Prenatal Vit-FePoly-FA-DHA (VITAFOL-ONE) 29-1-200 MG CAPS Take 1 capsule by mouth daily before breakfast. 05/14/13  Yes Shelly Bombard, MD  cyclobenzaprine (FLEXERIL) 10 MG tablet Take 1 tablet (10 mg total) by mouth 2 (two) times daily as needed for muscle spasms. 10/18/13   Jalaina Salyers Marilu Favre, PA-C  traMADol (ULTRAM) 50 MG  tablet Take 1 tablet (50 mg total) by mouth every 6 (six) hours as needed. 10/18/13   Celester Morgan Marilu Favre, PA-C   BP 133/87  Pulse 78  Temp(Src) 98.3 F (36.8 C) (Oral)  Resp 18  SpO2 100%  LMP 10/18/2013 Physical Exam  Nursing note and vitals reviewed. Constitutional: She appears well-developed and well-nourished. No distress.  HENT:  Head: Normocephalic and atraumatic.  Eyes: Pupils are equal, round, and reactive to light.  Neck: Normal range of motion. Neck supple.  Cardiovascular: Normal rate and regular rhythm.   Pulmonary/Chest: Effort normal.  Abdominal: Soft.  Musculoskeletal:       Left shoulder: She exhibits tenderness and pain (with ROM). She exhibits normal range of motion, no bony tenderness, no swelling, no effusion, no crepitus, no deformity, no laceration, no spasm, normal pulse and normal strength.  Neurological: She is alert.  Skin: Skin is warm and dry.    ED Course  Procedures (including critical care time) Labs Review Labs Reviewed - No data to display  Imaging Review Dg Shoulder Left  10/18/2013   CLINICAL DATA:  Left shoulder pain and limited range of motion.  EXAM: LEFT SHOULDER - 2+ VIEW  COMPARISON:  None.  FINDINGS: There is no evidence of fracture or dislocation. Minimal ossification projecting anterior to the humeral head on the axillary view may reflect anterior labral injury.  The left humeral head is seated within the glenoid fossa. The acromioclavicular joint is unremarkable in appearance. The visualized portions of the left lung are clear.  IMPRESSION: 1. No evidence of fracture or dislocation. 2. Minimal ossification projecting anterior to the humeral head on the axillary view may reflect anterior labral injury. If the patient's symptoms persist, MRI of the left shoulder is recommended for further evaluation.   Electronically Signed   By: Garald Balding M.D.   On: 10/18/2013 21:55     EKG Interpretation None      MDM   Final diagnoses:   Shoulder pain, left    Pt with musculoskeletal shoulder pain. Questionable labral injury per xray finding. She denies trauma or injury. She has had constant pain since Tuesday without any other symptoms. Will treat with shoulder sling, NSAIDs, muscle relaxers and referral to Ortho. Discussed possibly needing MRI.   38 y.o.Shrinika A Steinkamp's evaluation in the Emergency Department is complete. It has been determined that no acute conditions requiring further emergency intervention are present at this time. The patient/guardian have been advised of the diagnosis and plan. We have discussed signs and symptoms that warrant return to the ED, such as changes or worsening in symptoms.  Vital signs are stable at discharge. Filed Vitals:   10/18/13 2116  BP: 133/87  Pulse: 78  Temp: 98.3 F (36.8 C)  Resp: 18    Patient/guardian has voiced understanding and agreed to follow-up with the PCP or specialist.     Linus Mako, PA-C 10/19/13 0012

## 2013-10-18 NOTE — Discharge Instructions (Signed)
Acromioclavicular Injuries °The AC (acromioclavicular) joint is the joint in the shoulder where the collarbone (clavicle) meets the shoulder blade (scapula). The part of the shoulder blade connected to the collarbone is called the acromion. Common problems with and treatments for the AC joint are detailed below. °ARTHRITIS °Arthritis occurs when the joint has been injured and the smooth padding between the joints (cartilage) is lost. This is the wear and tear seen in most joints of the body if they have been overused. This causes the joint to produce pain and swelling which is worse with activity.  °AC JOINT SEPARATION °AC joint separation means that the ligaments connecting the acromion of the shoulder blade and collarbone have been damaged, and the two bones no longer line up. AC separations can be anywhere from mild to severe, and are "graded" depending upon which ligaments are torn and how badly they are torn. °· Grade I Injury: the least damage is done, and the AC joint still lines up. °· Grade II Injury: damage to the ligaments which reinforce the AC joint. In a Grade II injury, these ligaments are stretched but not entirely torn. When stressed, the AC joint becomes painful and unstable. °· Grade III Injury: AC and secondary ligaments are completely torn, and the collarbone is no longer attached to the shoulder blade. This results in deformity; a prominence of the end of the clavicle. °AC JOINT FRACTURE °AC joint fracture means that there has been a break in the bones of the AC joint, usually the end of the clavicle. °TREATMENT °TREATMENT OF AC ARTHRITIS °· There is currently no way to replace the cartilage damaged by arthritis. The best way to improve the condition is to decrease the activities which aggravate the problem. Application of ice to the joint helps decrease pain and soreness (inflammation). The use of non-steroidal anti-inflammatory medication is helpful. °· If less conservative measures do not  work, then cortisone shots (injections) may be used. These are anti-inflammatories; they decrease the soreness in the joint and swelling. °· If non-surgical measures fail, surgery may be recommended. The procedure is generally removal of a portion of the end of the clavicle. This is the part of the collarbone closest to your acromion which is stabilized with ligaments to the acromion of the shoulder blade. This surgery may be performed using a tube-like instrument with a light (arthroscope) for looking into a joint. It may also be performed as an open surgery through a small incision by the surgeon. Most patients will have good range of motion within 6 weeks and may return to all activity including sports by 8-12 weeks, barring complications. °TREATMENT OF AN AC SEPARATION °· The initial treatment is to decrease pain. This is best accomplished by immobilizing the arm in a sling and placing an ice pack to the shoulder for 20 to 30 minutes every 2 hours as needed. As the pain starts to subside, it is important to begin moving the fingers, wrist, elbow and eventually the shoulder in order to prevent a stiff or "frozen" shoulder. Instruction on when and how much to move the shoulder will be provided by your caregiver. The length of time needed to regain full motion and function depends on the amount or grade of the injury. Recovery from a Grade I AC separation usually takes 10 to 14 days, whereas a Grade III may take 6 to 8 weeks. °· Grade I and II separations usually do not require surgery. Even Grade III injuries usually allow return to full   activity with few restrictions. Treatment is also based on the activity demands of the injured shoulder. For example, a high level quarterback with an injured throwing arm will receive more aggressive treatment than someone with a desk job who rarely uses his/her arm for strenuous activities. In some cases, a painful lump may persist which could require a later surgery. Surgery  can be very successful, but the benefits must be weighed against the potential risks. °TREATMENT OF AN AC JOINT FRACTURE °Fracture treatment depends on the type of fracture. Sometimes a splint or sling may be all that is required. Other times surgery may be required for repair. This is more frequently the case when the ligaments supporting the clavicle are completely torn. Your caregiver will help you with these decisions and together you can decide what will be the best treatment. °HOME CARE INSTRUCTIONS  °· Apply ice to the injury for 15-20 minutes each hour while awake for 2 days. Put the ice in a plastic bag and place a towel between the bag of ice and skin. °· If a sling has been applied, wear it constantly for as long as directed by your caregiver, even at night. The sling or splint can be removed for bathing or showering or as directed. Be sure to keep the shoulder in the same place as when the sling is on. Do not lift the arm. °· If a figure-of-eight splint has been applied it should be tightened gently by another person every day. Tighten it enough to keep the shoulders held back. Allow enough room to place the index finger between the body and strap. Loosen the splint immediately if there is numbness or tingling in the hands. °· Take over-the-counter or prescription medicines for pain, discomfort or fever as directed by your caregiver. °· If you or your child has received a follow up appointment, it is very important to keep that appointment in order to avoid long term complications, chronic pain or disability. °SEEK MEDICAL CARE IF:  °· The pain is not relieved with medications. °· There is increased swelling or discoloration that continues to get worse rather than better. °· You or your child has been unable to follow up as instructed. °· There is progressive numbness and tingling in the arm, forearm or hand. °SEEK IMMEDIATE MEDICAL CARE IF:  °· The arm is numb, cold or pale. °· There is increasing pain  in the hand, forearm or fingers. °MAKE SURE YOU:  °· Understand these instructions. °· Will watch your condition. °· Will get help right away if you are not doing well or get worse. °Document Released: 11/03/2004 Document Revised: 04/18/2011 Document Reviewed: 04/28/2008 °[employer]® Patient Information ©2015 [employer]. This information is not intended to replace advice given to you by your health care provider. Make sure you discuss any questions you have with your health care provider. ° °

## 2013-10-18 NOTE — ED Notes (Signed)
Patient reports sharp pain in left shoulder on Tuesday, 8/10 pain with movement, denies numbness and tingling. Denies injury to same.

## 2013-10-19 NOTE — ED Provider Notes (Signed)
Medical screening examination/treatment/procedure(s) were performed by non-physician practitioner and as supervising physician I was immediately available for consultation/collaboration.   EKG Interpretation None       Kalman Drape, MD 10/19/13 [phone]

## 2013-11-27 ENCOUNTER — Emergency Department (HOSPITAL_COMMUNITY): Payer: Medicaid Other

## 2013-11-27 ENCOUNTER — Encounter (HOSPITAL_COMMUNITY): Payer: Self-pay | Admitting: Emergency Medicine

## 2013-11-27 ENCOUNTER — Emergency Department (HOSPITAL_COMMUNITY)
Admission: EM | Admit: 2013-11-27 | Discharge: 2013-11-27 | Disposition: A | Discharge status: Home / Self Care | Payer: Medicaid Other | Attending: Emergency Medicine | Admitting: Emergency Medicine

## 2013-11-27 DIAGNOSIS — Z872 Personal history of diseases of the skin and subcutaneous tissue: Secondary | ICD-10-CM | POA: Insufficient documentation

## 2013-11-27 DIAGNOSIS — Z8639 Personal history of other endocrine, nutritional and metabolic disease: Secondary | ICD-10-CM | POA: Insufficient documentation

## 2013-11-27 DIAGNOSIS — Z720 Tobacco use: Secondary | ICD-10-CM | POA: Insufficient documentation

## 2013-11-27 DIAGNOSIS — J309 Allergic rhinitis, unspecified: Secondary | ICD-10-CM | POA: Diagnosis not present

## 2013-11-27 DIAGNOSIS — H9209 Otalgia, unspecified ear: Secondary | ICD-10-CM | POA: Diagnosis not present

## 2013-11-27 DIAGNOSIS — J069 Acute upper respiratory infection, unspecified: Secondary | ICD-10-CM | POA: Diagnosis not present

## 2013-11-27 DIAGNOSIS — F17200 Nicotine dependence, unspecified, uncomplicated: Secondary | ICD-10-CM

## 2013-11-27 DIAGNOSIS — R05 Cough: Secondary | ICD-10-CM | POA: Diagnosis present

## 2013-11-27 MED ORDER — GUAIFENESIN 100 MG/5ML PO LIQD: 100.0000 mg | ORAL | Status: DC | PRN

## 2013-11-27 MED ORDER — BENZONATATE 100 MG PO CAPS: 100.0000 mg | ORAL_CAPSULE | Freq: Three times a day (TID) | ORAL | Status: DC

## 2013-11-27 MED ORDER — FLUTICASONE PROPIONATE 50 MCG/ACT NA SUSP: 2.0000 | Freq: Every day | NASAL | Status: DC

## 2013-11-27 MED ORDER — ALBUTEROL SULFATE HFA 108 (90 BASE) MCG/ACT IN AERS
2.0000 | INHALATION_SPRAY | RESPIRATORY_TRACT | Status: DC | PRN
  Administered 2013-11-27: 2 via RESPIRATORY_TRACT
  Filled 2013-11-27: qty 6.7

## 2013-11-27 NOTE — ED Provider Notes (Signed)
CSN: [id number]     Arrival date & time 11/27/13  1103 History   First MD Initiated Contact with Patient 11/27/13 1119     Chief Complaint  Patient presents with  . URI     (Consider location/radiation/quality/duration/timing/severity/associated sxs/prior Treatment) HPI Pt is a 38yo female presenting to ED with c/o 3 day hx of gradually worsening nasal congestion, cough, and mild, intermittent SOB.  States she is a daily smoker but does not have a hx of asthma. She has tried OTC alka-seltzer cold and sinus as well as Nyquill w/o relief.  Works in a [employer] but no sick contacts at home or recent travel. Denies fever, n/v/d.   Past Medical History  Diagnosis Date  . Vitamin D deficiency   . H/O pilonidal cyst    Past Surgical History  Procedure Laterality Date  . No past surgeries     Family History  Problem Relation Age of Onset  . Hypertension Mother   . Diabetes Mother   . Asthma Mother   . COPD Mother   . Depression Mother   . Miscarriages / [place] Mother   . Vision loss Mother   . Parkinson's disease Father    History  Substance Use Topics  . Smoking status: Current Some Day Smoker -- 0.50 packs/day    Types: Cigarettes  . Smokeless tobacco: Never Used  . Alcohol Use: No   OB History   Grav Para Term Preterm Abortions TAB SAB Ect Mult Living   5 3 3  2  1 1  3      Review of Systems  Constitutional: Negative for fever and chills.  HENT: Positive for congestion, ear pain and sore throat. Negative for trouble swallowing and voice change.   Respiratory: Positive for cough and shortness of breath. Negative for wheezing.   Cardiovascular: Negative for chest pain.  Gastrointestinal: Negative for nausea, vomiting, abdominal pain and diarrhea.  All other systems reviewed and are negative.     Allergies  Review of patient's allergies indicates no known allergies.  Home Medications   Prior to Admission medications   Medication Sig Start Date End  Date Taking? Authorizing Provider  DM-Doxylamine-Acetaminophen (NYQUIL COLD & FLU PO) Take 30 mLs by mouth daily as needed (cold symptoms).   Yes Historical Provider, MD  Phenyleph-Doxylamine-DM-APAP (ALKA SELTZER PLUS PO) Take 1 packet by mouth daily as needed (cold symptoms).   Yes Historical Provider, MD  Prenatal Vit-FePoly-FA-DHA (VITAFOL-ONE) 29-1-200 MG CAPS Take 1 capsule by mouth daily before breakfast. 05/14/13  Yes Shelly Bombard, MD  benzonatate (TESSALON) 100 MG capsule Take 1 capsule (100 mg total) by mouth every 8 (eight) hours. 11/27/13   Noland Fordyce, PA-C  fluticasone (FLONASE) 50 MCG/ACT nasal spray Place 2 sprays into both nostrils daily. 11/27/13   Noland Fordyce, PA-C  guaiFENesin (ROBITUSSIN) 100 MG/5ML liquid Take 5-10 mLs (100-200 mg total) by mouth every 4 (four) hours as needed for cough. 11/27/13   Noland Fordyce, PA-C   BP 135/86  Pulse 84  Temp(Src) 98.3 F (36.8 C) (Oral)  Resp 20  SpO2 99%  LMP 11/12/2013 Physical Exam  Nursing note and vitals reviewed. Constitutional: She appears well-developed and well-nourished.  Pt wearing surgical mask, appears acutely ill, intermittent productive cough  HENT:  Head: Normocephalic and atraumatic.  Right Ear: Hearing, tympanic membrane, external ear and ear canal normal.  Left Ear: Hearing, tympanic membrane, external ear and ear canal normal.  Nose: Mucosal edema present.  Mouth/Throat: Uvula is midline  and mucous membranes are normal. Posterior oropharyngeal erythema present. No oropharyngeal exudate or posterior oropharyngeal edema.  Eyes: Conjunctivae are normal. No scleral icterus.  Neck: Normal range of motion. Neck supple.  Cardiovascular: Normal rate, regular rhythm and normal heart sounds.   Pulmonary/Chest: Effort normal. No respiratory distress. She has wheezes. She has rales. She exhibits no tenderness.  Abdominal: Soft. Bowel sounds are normal. She exhibits no distension and no mass. There is no tenderness.  There is no rebound and no guarding.  Musculoskeletal: Normal range of motion.  Neurological: She is alert.  Skin: Skin is warm and dry. She is not diaphoretic.    ED Course  Procedures (including critical care time) Labs Review Labs Reviewed - No data to display  Imaging Review Dg Chest 2 View  11/27/2013   CLINICAL DATA:  Smoker approximately 1/2 pack per day, with cough, congestion, shortness of breath and mid chest pain for 3-4 days, not improving  EXAM: CHEST  2 VIEW  COMPARISON:  10/27/2012  FINDINGS: Upper normal heart size with normal situs.  RIGHT side aortic arch again noted, developmental anomaly.  Mediastinal contours and pulmonary vascularity otherwise normal.  Lungs clear.  No pleural effusion or pneumothorax.  Minimal elevation of LEFT diaphragm.  Bones unremarkable.  IMPRESSION: No acute abnormalities.  RIGHT-side aortic arch.   Electronically Signed   By: Lavonia Dana M.D.   On: 11/27/2013 12:01     EKG Interpretation None      MDM   Final diagnoses:  URI, acute  Current smoker  Allergic rhinitis, unspecified allergic rhinitis type   Pt presenting with URI symptoms including cough, congestion, nasal mucosa edema, and mild expiratory wheeze.  No fever, n/v/d. No respiratory distress. CXR: no acute abnormalities.  Advised pt to continue conservative tx. Encouraged pt to use acetaminophen and ibuprofen as needed for fever and pain. Encouraged rest and fluids. Also discussed smoking cessation and provided pt education on tips for smoking cessation. Return precautions provided. Pt verbalized understanding and agreement with tx plan.     Noland Fordyce, PA-C 11/27/13 1221

## 2013-11-27 NOTE — ED Notes (Signed)
She c/o sinus congestion, plus non-productive congested cough since this Sun.  She is in no distress.

## 2013-11-30 ENCOUNTER — Emergency Department (HOSPITAL_COMMUNITY)
Admission: EM | Admit: 2013-11-30 | Discharge: 2013-11-30 | Disposition: A | Discharge status: Home / Self Care | Payer: Medicaid Other | Attending: Emergency Medicine | Admitting: Emergency Medicine

## 2013-11-30 ENCOUNTER — Encounter (HOSPITAL_COMMUNITY): Payer: Self-pay | Admitting: Emergency Medicine

## 2013-11-30 ENCOUNTER — Emergency Department (HOSPITAL_COMMUNITY): Payer: Medicaid Other

## 2013-11-30 DIAGNOSIS — Z8639 Personal history of other endocrine, nutritional and metabolic disease: Secondary | ICD-10-CM | POA: Insufficient documentation

## 2013-11-30 DIAGNOSIS — Z7951 Long term (current) use of inhaled steroids: Secondary | ICD-10-CM | POA: Diagnosis not present

## 2013-11-30 DIAGNOSIS — R0602 Shortness of breath: Secondary | ICD-10-CM | POA: Diagnosis present

## 2013-11-30 DIAGNOSIS — Z79899 Other long term (current) drug therapy: Secondary | ICD-10-CM | POA: Diagnosis not present

## 2013-11-30 DIAGNOSIS — Z872 Personal history of diseases of the skin and subcutaneous tissue: Secondary | ICD-10-CM | POA: Insufficient documentation

## 2013-11-30 DIAGNOSIS — J189 Pneumonia, unspecified organism: Secondary | ICD-10-CM

## 2013-11-30 DIAGNOSIS — J159 Unspecified bacterial pneumonia: Secondary | ICD-10-CM | POA: Insufficient documentation

## 2013-11-30 DIAGNOSIS — Z720 Tobacco use: Secondary | ICD-10-CM | POA: Diagnosis not present

## 2013-11-30 MED ORDER — AZITHROMYCIN 250 MG PO TABS: 250.0000 mg | ORAL_TABLET | Freq: Every day | ORAL | Status: DC

## 2013-11-30 MED ORDER — PREDNISONE 50 MG PO TABS: 50.0000 mg | ORAL_TABLET | Freq: Every day | ORAL | Status: DC

## 2013-11-30 NOTE — ED Notes (Addendum)
Patient reports starting Sunday - had runny nose. Monday started having cough and "rattling" in her chest with SOB. Was given nasal spray and Tessalon with no alleviation of symptoms. Unable to have productive cough. Has had pneumonia and says "it feels like that again." Denies fevers but says "I've just been hot." Reports chest pain when coughing. Tried albuterol inhaler with no alleviation of symptoms. A&Ox4. Moving all extremities. RR even/unlabored. Non-productive, strong cough noted. Has had nausea but denies vomiting or diarrhea. Smokes 0.5 ppd still. No other complaints/concerns.

## 2013-11-30 NOTE — Discharge Instructions (Signed)
We saw you in the ER for the cough. It appears that you might have a right sided pneumonia. Take the antibiotics, and the steroids. Take albuterol every 4 hours.  Please return to the ER if your symptoms worsen; you have increased pain, fevers, chills, inability to keep any medications down, confusion. Otherwise see the outpatient doctor as requested.   Pneumonia Pneumonia is an infection of the lungs.  CAUSES Pneumonia may be caused by bacteria or a virus. Usually, these infections are caused by breathing infectious particles into the lungs (respiratory tract). SIGNS AND SYMPTOMS   Cough.  Fever.  Chest pain.  Increased rate of breathing.  Wheezing.  Mucus production. DIAGNOSIS  If you have the common symptoms of pneumonia, your health care provider will typically confirm the diagnosis with a chest X-ray. The X-ray will show an abnormality in the lung (pulmonary infiltrate) if you have pneumonia. Other tests of your blood, urine, or sputum may be done to find the specific cause of your pneumonia. Your health care provider may also do tests (blood gases or pulse oximetry) to see how well your lungs are working. TREATMENT  Some forms of pneumonia may be spread to other people when you cough or sneeze. You may be asked to wear a mask before and during your exam. Pneumonia that is caused by bacteria is treated with antibiotic medicine. Pneumonia that is caused by the influenza virus may be treated with an antiviral medicine. Most other viral infections must run their course. These infections will not respond to antibiotics.  HOME CARE INSTRUCTIONS   Cough suppressants may be used if you are losing too much rest. However, coughing protects you by clearing your lungs. You should avoid using cough suppressants if you can.  Your health care provider may have prescribed medicine if he or she thinks your pneumonia is caused by bacteria or influenza. Finish your medicine even if you start to  feel better.  Your health care provider may also prescribe an expectorant. This loosens the mucus to be coughed up.  Take medicines only as directed by your health care provider.  Do not smoke. Smoking is a common cause of bronchitis and can contribute to pneumonia. If you are a smoker and continue to smoke, your cough may last several weeks after your pneumonia has cleared.  A cold steam vaporizer or humidifier in your room or home may help loosen mucus.  Coughing is often worse at night. Sleeping in a semi-upright position in a recliner or using a couple pillows under your head will help with this.  Get rest as you feel it is needed. Your body will usually let you know when you need to rest. PREVENTION A pneumococcal shot (vaccine) is available to prevent a common bacterial cause of pneumonia. This is usually suggested for:  People over 23 years old.  Patients on chemotherapy.  People with chronic lung problems, such as bronchitis or emphysema.  People with immune system problems. If you are over 65 or have a high risk condition, you may receive the pneumococcal vaccine if you have not received it before. In some countries, a routine influenza vaccine is also recommended. This vaccine can help prevent some cases of pneumonia.You may be offered the influenza vaccine as part of your care. If you smoke, it is time to quit. You may receive instructions on how to stop smoking. Your health care provider can provide medicines and counseling to help you quit. SEEK MEDICAL CARE IF: You have a  fever. SEEK IMMEDIATE MEDICAL CARE IF:   Your illness becomes worse. This is especially true if you are elderly or weakened from any other disease.  You cannot control your cough with suppressants and are losing sleep.  You begin coughing up blood.  You develop pain which is getting worse or is uncontrolled with medicines.  Any of the symptoms which initially brought you in for treatment are  getting worse rather than better.  You develop shortness of breath or chest pain. MAKE SURE YOU:   Understand these instructions.  Will watch your condition.  Will get help right away if you are not doing well or get worse. Document Released: 01/24/2005 Document Revised: 06/10/2013 Document Reviewed: 04/15/2010 [facility] Patient Information 2015 [place], [place]. This information is not intended to replace advice given to you by your health care provider. Make sure you discuss any questions you have with your health care provider.

## 2013-11-30 NOTE — ED Provider Notes (Signed)
CSN: [id number]     Arrival date & time 11/30/13  1924 History   First MD Initiated Contact with Patient 11/30/13 2016     Chief Complaint  Patient presents with  . Shortness of Breath  . Cough     (Consider location/radiation/quality/duration/timing/severity/associated sxs/prior Treatment) HPI Comments: Pt comes in with cc of cough. Pt has been having URI like sx for the past few days. Now she is having cough, dry, but rattling type. She is also having increased weakness. No n/v/f/c.  Patient is a 38 y.o. female presenting with shortness of breath and cough. The history is provided by the patient.  Shortness of Breath Associated symptoms: cough   Associated symptoms: no abdominal pain, no chest pain, no headaches, no neck pain and no vomiting   Cough Associated symptoms: shortness of breath   Associated symptoms: no chest pain and no headaches     Past Medical History  Diagnosis Date  . Vitamin D deficiency   . H/O pilonidal cyst    Past Surgical History  Procedure Laterality Date  . No past surgeries     Family History  Problem Relation Age of Onset  . Hypertension Mother   . Diabetes Mother   . Asthma Mother   . COPD Mother   . Depression Mother   . Miscarriages / [place] Mother   . Vision loss Mother   . Parkinson's disease Father    History  Substance Use Topics  . Smoking status: Current Some Day Smoker -- 0.50 packs/day    Types: Cigarettes  . Smokeless tobacco: Never Used  . Alcohol Use: No   OB History   Grav Para Term Preterm Abortions TAB SAB Ect Mult Living   5 3 3  2  1 1  3      Review of Systems  Constitutional: Positive for activity change.  Respiratory: Positive for cough and shortness of breath.   Cardiovascular: Negative for chest pain.  Gastrointestinal: Negative for nausea, vomiting and abdominal pain.  Genitourinary: Negative for dysuria.  Musculoskeletal: Negative for neck pain.  Neurological: Negative for headaches.       Allergies  Review of patient's allergies indicates no known allergies.  Home Medications   Prior to Admission medications   Medication Sig Start Date End Date Taking? Authorizing Provider  albuterol (PROVENTIL HFA;VENTOLIN HFA) 108 (90 BASE) MCG/ACT inhaler Inhale 2 puffs into the lungs every 6 (six) hours as needed for wheezing or shortness of breath.   Yes Historical Provider, MD  benzonatate (TESSALON) 100 MG capsule Take 1 capsule (100 mg total) by mouth every 8 (eight) hours. 11/27/13  Yes Noland Fordyce, PA-C  DM-Doxylamine-Acetaminophen (NYQUIL COLD & FLU PO) Take 30 mLs by mouth daily as needed (cold symptoms).   Yes Historical Provider, MD  fluticasone (FLONASE) 50 MCG/ACT nasal spray Place 2 sprays into both nostrils daily. 11/27/13  Yes Noland Fordyce, PA-C  Phenyleph-Doxylamine-DM-APAP (ALKA SELTZER PLUS PO) Take 1 packet by mouth daily as needed (cold symptoms).   Yes Historical Provider, MD  azithromycin (ZITHROMAX) 250 MG tablet Take 1 tablet (250 mg total) by mouth daily. Take first 2 tablets together, then 1 every day until finished. 11/30/13   Varney Biles, MD  predniSONE (DELTASONE) 50 MG tablet Take 1 tablet (50 mg total) by mouth daily. 11/30/13   Zhavia Cunanan Kathrynn Humble, MD   BP 126/99  Pulse 92  Temp(Src) 98.6 F (37 C) (Oral)  Resp 22  Ht 5\' 6"  (1.676 m)  Wt 245 lb (111.131  kg)  BMI 39.56 kg/m2  SpO2 100%  LMP 11/12/2013 Physical Exam  Nursing note and vitals reviewed. Constitutional: She is oriented to person, place, and time. She appears well-developed and well-nourished.  HENT:  Head: Normocephalic and atraumatic.  Eyes: EOM are normal. Pupils are equal, round, and reactive to light.  Neck: Neck supple.  Cardiovascular: Normal rate, regular rhythm and normal heart sounds.   No murmur heard. Pulmonary/Chest: Effort normal. No respiratory distress. She has wheezes.  Diffuse rhonchi  Abdominal: Soft. She exhibits no distension. There is no tenderness.  There is no rebound and no guarding.  Neurological: She is alert and oriented to person, place, and time.  Skin: Skin is warm and dry.    ED Course  Procedures (including critical care time) Labs Review Labs Reviewed - No data to display  Imaging Review Dg Chest Port 1 View  11/30/2013   CLINICAL DATA:  Cough, shortness of breath, rhinorrhea  EXAM: PORTABLE CHEST - 1 VIEW  COMPARISON:  11/27/2013  FINDINGS: Possible mild patchy opacity obscuring the right heart border. No pleural effusion or pneumothorax.  The heart is normal in size.  IMPRESSION: Possible mild patchy opacity obscuring the right heart border.  Consider PA/ lateral chest radiographs for further evaluation.   Electronically Signed   By: Julian Hy M.D.   On: 11/30/2013 21:18     EKG Interpretation None      MDM   Final diagnoses:  CAP (community acquired pneumonia)    Pt comes in with cc of cough, worsening weakness. She is a smoker.  Smoking cessation instruction/counseling given:  counseled patient on the dangers of tobacco use, advised patient to stop smoking, and reviewed strategies to maximize success.  Pt on exam has some rhonchi, and the CXR shows Right sided opacity. Suspicion for possible lower resp disease, and we will treat pt for bacterial CAP, although viral etiology is also possible. She already has an inhaler. She has some wheezing at home as well - will add prednisone.   Varney Biles, MD 11/30/13 2210

## 2013-12-02 NOTE — ED Provider Notes (Signed)
Medical screening examination/treatment/procedure(s) were performed by non-physician practitioner and as supervising physician I was immediately available for consultation/collaboration.   EKG Interpretation None       Charlesetta Shanks, MD 12/02/13 1536

## 2013-12-06 ENCOUNTER — Emergency Department (HOSPITAL_COMMUNITY)
Admission: EM | Admit: 2013-12-06 | Discharge: 2013-12-07 | Disposition: A | Discharge status: Home / Self Care | Payer: Medicaid Other | Attending: Emergency Medicine | Admitting: Emergency Medicine

## 2013-12-06 ENCOUNTER — Encounter (HOSPITAL_COMMUNITY): Payer: Self-pay | Admitting: Emergency Medicine

## 2013-12-06 ENCOUNTER — Emergency Department (HOSPITAL_COMMUNITY): Payer: Medicaid Other

## 2013-12-06 DIAGNOSIS — J159 Unspecified bacterial pneumonia: Secondary | ICD-10-CM | POA: Insufficient documentation

## 2013-12-06 DIAGNOSIS — R0602 Shortness of breath: Secondary | ICD-10-CM

## 2013-12-06 DIAGNOSIS — J189 Pneumonia, unspecified organism: Secondary | ICD-10-CM

## 2013-12-06 DIAGNOSIS — Z8639 Personal history of other endocrine, nutritional and metabolic disease: Secondary | ICD-10-CM | POA: Insufficient documentation

## 2013-12-06 DIAGNOSIS — Z720 Tobacco use: Secondary | ICD-10-CM | POA: Diagnosis not present

## 2013-12-06 DIAGNOSIS — R05 Cough: Secondary | ICD-10-CM | POA: Diagnosis present

## 2013-12-06 DIAGNOSIS — Z79899 Other long term (current) drug therapy: Secondary | ICD-10-CM | POA: Insufficient documentation

## 2013-12-06 DIAGNOSIS — Z872 Personal history of diseases of the skin and subcutaneous tissue: Secondary | ICD-10-CM | POA: Insufficient documentation

## 2013-12-06 MED ORDER — AEROCHAMBER PLUS FLO-VU MEDIUM MISC
1.0000 | Freq: Once | Status: DC
  Filled 2013-12-06: qty 1

## 2013-12-06 MED ORDER — ALBUTEROL SULFATE HFA 108 (90 BASE) MCG/ACT IN AERS
1.0000 | INHALATION_SPRAY | RESPIRATORY_TRACT | Status: DC | PRN
  Filled 2013-12-06: qty 6.7

## 2013-12-06 MED ORDER — HYDROCOD POLST-CHLORPHEN POLST 10-8 MG/5ML PO LQCR
5.0000 mL | Freq: Once | ORAL | Status: AC
  Administered 2013-12-07: 5 mL via ORAL
  Filled 2013-12-06: qty 5

## 2013-12-06 MED ORDER — LEVOFLOXACIN 750 MG PO TABS
750.0000 mg | ORAL_TABLET | Freq: Once | ORAL | Status: AC
  Administered 2013-12-07: 750 mg via ORAL
  Filled 2013-12-06: qty 1

## 2013-12-06 NOTE — ED Provider Notes (Signed)
CSN: [id number]     Arrival date & time 12/06/13  1851 History   First MD Initiated Contact with Patient 12/06/13 2301     Chief Complaint  Patient presents with  . Pneumonia  . Cough     (Consider location/radiation/quality/duration/timing/severity/associated sxs/prior Treatment) HPI A 38 year old female presents to the emergency department from home with complaint of persistent cough.  She was diagnosed with pneumonia this past Saturday.  She has finished her course of prednisone and Z-Pak.  She was seen by her primary care doctor on Wednesday give her a "antibiotic shot" she does not know the name of this antibiotic.  She reports that despite using her inhaler and neb treatments, Tessalon and cough syrup with codeine, she is still having cough and shortness of breath.  She is a smoker.  She has had no fevers.  No dyspnea on exertion. Past Medical History  Diagnosis Date  . Vitamin D deficiency   . H/O pilonidal cyst    Past Surgical History  Procedure Laterality Date  . No past surgeries     Family History  Problem Relation Age of Onset  . Hypertension Mother   . Diabetes Mother   . Asthma Mother   . COPD Mother   . Depression Mother   . Miscarriages / [place] Mother   . Vision loss Mother   . Parkinson's disease Father    History  Substance Use Topics  . Smoking status: Current Some Day Smoker -- 0.50 packs/day    Types: Cigarettes  . Smokeless tobacco: Never Used  . Alcohol Use: No   OB History   Grav Para Term Preterm Abortions TAB SAB Ect Mult Living   5 3 3  2  1 1  3      Review of Systems  See History of Present Illness; otherwise all other systems are reviewed and negative   Allergies  Review of patient's allergies indicates no known allergies.  Home Medications   Prior to Admission medications   Medication Sig Start Date End Date Taking? Authorizing Provider  albuterol (PROVENTIL HFA;VENTOLIN HFA) 108 (90 BASE) MCG/ACT inhaler Inhale 2 puffs  into the lungs every 6 (six) hours as needed for wheezing or shortness of breath.    Historical Provider, MD  azithromycin (ZITHROMAX) 250 MG tablet Take 1 tablet (250 mg total) by mouth daily. Take first 2 tablets together, then 1 every day until finished. 11/30/13   Varney Biles, MD  benzonatate (TESSALON) 100 MG capsule Take 1 capsule (100 mg total) by mouth every 8 (eight) hours. 11/27/13   Noland Fordyce, PA-C  DM-Doxylamine-Acetaminophen (NYQUIL COLD & FLU PO) Take 30 mLs by mouth daily as needed (cold symptoms).    Historical Provider, MD  fluticasone (FLONASE) 50 MCG/ACT nasal spray Place 2 sprays into both nostrils daily. 11/27/13   Noland Fordyce, PA-C  Phenyleph-Doxylamine-DM-APAP (ALKA SELTZER PLUS PO) Take 1 packet by mouth daily as needed (cold symptoms).    Historical Provider, MD  predniSONE (DELTASONE) 50 MG tablet Take 1 tablet (50 mg total) by mouth daily. 11/30/13   Ankit Kathrynn Humble, MD   BP 131/88  Pulse 68  Temp(Src) 98.7 F (37.1 C) (Oral)  Resp 18  SpO2 96%  LMP 11/12/2013 Physical Exam  Nursing note and vitals reviewed. Constitutional: She is oriented to person, place, and time. She appears well-developed and well-nourished.  HENT:  Head: Normocephalic and atraumatic.  Nose: Nose normal.  Mouth/Throat: Oropharynx is clear and moist.  Eyes: Conjunctivae and EOM are normal.  Pupils are equal, round, and reactive to light.  Neck: Normal range of motion. Neck supple. No JVD present. No tracheal deviation present. No thyromegaly present.  Cardiovascular: Normal rate, regular rhythm, normal heart sounds and intact distal pulses.  Exam reveals no gallop and no friction rub.   No murmur heard. Pulmonary/Chest: Effort normal. No stridor. No respiratory distress. She has no wheezes. She has no rales. She exhibits no tenderness.  Coarse breath sounds bilaterally  Abdominal: Soft. Bowel sounds are normal. She exhibits no distension and no mass. There is no tenderness. There is  no rebound and no guarding.  Musculoskeletal: Normal range of motion. She exhibits no edema and no tenderness.  Lymphadenopathy:    She has no cervical adenopathy.  Neurological: She is alert and oriented to person, place, and time. She displays normal reflexes. She exhibits normal muscle tone. Coordination normal.  Skin: Skin is warm and dry. No rash noted. No erythema. No pallor.  Psychiatric: She has a normal mood and affect. Her behavior is normal. Judgment and thought content normal.    ED Course  Procedures (including critical care time) Labs Review Labs Reviewed - No data to display  Imaging Review Dg Chest 2 View  12/06/2013   CLINICAL DATA:  Shortness of breath. Recent pneumonia. Persistent cough and chest congestion.  EXAM: CHEST  2 VIEW  COMPARISON:  11/30/2013  FINDINGS: Increased opacity and volume loss is seen involving the right middle lobe, which may be due to atelectasis or pneumonia. Left lung is clear. No evidence of pleural effusion. No definite hilar or mediastinal mass identified. Heart size is normal.  IMPRESSION: Increased right middle lobe atelectasis versus pneumonia. Suggest chest radiographic followup in several weeks to confirm resolution.   Electronically Signed   By: Earle Gell M.D.   On: 12/06/2013 19:58     EKG Interpretation None      MDM   Final diagnoses:  CAP (community acquired pneumonia)    38 year old female with persistent atelectasis versus pneumonia in her right middle lobe.  Patient has been on Zithromax, and an unknown antibiotic which may be Rocephin.  We'll place on Levaquin for a week.  Will change her cough syrup with codeine to Tussionex.  Patient encouraged to continue with her breathing treatments, Tessalon pearls.  Patient advised that she may have cough for up to 8 weeks with this illness.    Kalman Drape, MD 12/07/13 0001

## 2013-12-06 NOTE — ED Notes (Signed)
Pt diagnosed with pneumonia on Saturday. Pt reports taking all meds as prescribed but no relief. Still has nonproductive cough that is causing sob. spo2 96% at triage. Airway intact.

## 2013-12-07 MED ORDER — ALBUTEROL SULFATE HFA 108 (90 BASE) MCG/ACT IN AERS: 1.0000 | INHALATION_SPRAY | RESPIRATORY_TRACT | Status: DC | PRN

## 2013-12-07 MED ORDER — HYDROCOD POLST-CHLORPHEN POLST 10-8 MG/5ML PO LQCR: 5.0000 mL | Freq: Two times a day (BID) | ORAL | Status: DC | PRN

## 2013-12-07 MED ORDER — LEVOFLOXACIN 750 MG PO TABS: 750.0000 mg | ORAL_TABLET | Freq: Once | ORAL | Status: DC

## 2013-12-07 MED ORDER — BENZONATATE 100 MG PO CAPS: 100.0000 mg | ORAL_CAPSULE | Freq: Three times a day (TID) | ORAL | Status: DC

## 2013-12-07 NOTE — Discharge Instructions (Signed)
Expect to have lingering cough for some time with this illness, sometimes up to 6-8 weeks.  Continue to try to quit smoking.  This will help with your illness.  Take medications as prescribed.  Stop taking cough syrup with codeine and switch to Tussinex   Cough, Adult  A cough is a reflex that helps clear your throat and airways. It can help heal the body or may be a reaction to an irritated airway. A cough may only last 2 or 3 weeks (acute) or may last more than 8 weeks (chronic).  CAUSES Acute cough:  Viral or bacterial infections. Chronic cough:  Infections.  Allergies.  Asthma.  Post-nasal drip.  Smoking.  Heartburn or acid reflux.  Some medicines.  Chronic lung problems (COPD).  Cancer. SYMPTOMS   Cough.  Fever.  Chest pain.  Increased breathing rate.  High-pitched whistling sound when breathing (wheezing).  Colored mucus that you cough up (sputum). TREATMENT   A bacterial cough may be treated with antibiotic medicine.  A viral cough must run its course and will not respond to antibiotics.  Your caregiver may recommend other treatments if you have a chronic cough. HOME CARE INSTRUCTIONS   Only take over-the-counter or prescription medicines for pain, discomfort, or fever as directed by your caregiver. Use cough suppressants only as directed by your caregiver.  Use a cold steam vaporizer or humidifier in your bedroom or home to help loosen secretions.  Sleep in a semi-upright position if your cough is worse at night.  Rest as needed.  Stop smoking if you smoke. SEEK IMMEDIATE MEDICAL CARE IF:   You have pus in your sputum.  Your cough starts to worsen.  You cannot control your cough with suppressants and are losing sleep.  You begin coughing up blood.  You have difficulty breathing.  You develop pain which is getting worse or is uncontrolled with medicine.  You have a fever. MAKE SURE YOU:   Understand these instructions.  Will watch  your condition.  Will get help right away if you are not doing well or get worse. Document Released: 07/23/2010 Document Revised: 04/18/2011 Document Reviewed: 07/23/2010 [facility] Patient Information 2015 [place], [place]. This information is not intended to replace advice given to you by your health care provider. Make sure you discuss any questions you have with your health care provider.  Pneumonia Pneumonia is an infection of the lungs.  CAUSES Pneumonia may be caused by bacteria or a virus. Usually, these infections are caused by breathing infectious particles into the lungs (respiratory tract). SIGNS AND SYMPTOMS   Cough.  Fever.  Chest pain.  Increased rate of breathing.  Wheezing.  Mucus production. DIAGNOSIS  If you have the common symptoms of pneumonia, your health care provider will typically confirm the diagnosis with a chest X-ray. The X-ray will show an abnormality in the lung (pulmonary infiltrate) if you have pneumonia. Other tests of your blood, urine, or sputum may be done to find the specific cause of your pneumonia. Your health care provider may also do tests (blood gases or pulse oximetry) to see how well your lungs are working. TREATMENT  Some forms of pneumonia may be spread to other people when you cough or sneeze. You may be asked to wear a mask before and during your exam. Pneumonia that is caused by bacteria is treated with antibiotic medicine. Pneumonia that is caused by the influenza virus may be treated with an antiviral medicine. Most other viral infections must run their course.  These infections will not respond to antibiotics.  HOME CARE INSTRUCTIONS   Cough suppressants may be used if you are losing too much rest. However, coughing protects you by clearing your lungs. You should avoid using cough suppressants if you can.  Your health care provider may have prescribed medicine if he or she thinks your pneumonia is caused by bacteria or influenza. Finish  your medicine even if you start to feel better.  Your health care provider may also prescribe an expectorant. This loosens the mucus to be coughed up.  Take medicines only as directed by your health care provider.  Do not smoke. Smoking is a common cause of bronchitis and can contribute to pneumonia. If you are a smoker and continue to smoke, your cough may last several weeks after your pneumonia has cleared.  A cold steam vaporizer or humidifier in your room or home may help loosen mucus.  Coughing is often worse at night. Sleeping in a semi-upright position in a recliner or using a couple pillows under your head will help with this.  Get rest as you feel it is needed. Your body will usually let you know when you need to rest. PREVENTION A pneumococcal shot (vaccine) is available to prevent a common bacterial cause of pneumonia. This is usually suggested for:  People over 44 years old.  Patients on chemotherapy.  People with chronic lung problems, such as bronchitis or emphysema.  People with immune system problems. If you are over 65 or have a high risk condition, you may receive the pneumococcal vaccine if you have not received it before. In some countries, a routine influenza vaccine is also recommended. This vaccine can help prevent some cases of pneumonia.You may be offered the influenza vaccine as part of your care. If you smoke, it is time to quit. You may receive instructions on how to stop smoking. Your health care provider can provide medicines and counseling to help you quit. SEEK MEDICAL CARE IF: You have a fever. SEEK IMMEDIATE MEDICAL CARE IF:   Your illness becomes worse. This is especially true if you are elderly or weakened from any other disease.  You cannot control your cough with suppressants and are losing sleep.  You begin coughing up blood.  You develop pain which is getting worse or is uncontrolled with medicines.  Any of the symptoms which initially  brought you in for treatment are getting worse rather than better.  You develop shortness of breath or chest pain. MAKE SURE YOU:   Understand these instructions.  Will watch your condition.  Will get help right away if you are not doing well or get worse. Document Released: 01/24/2005 Document Revised: 06/10/2013 Document Reviewed: 04/15/2010 [facility] Patient Information 2015 [place], [place]. This information is not intended to replace advice given to you by your health care provider. Make sure you discuss any questions you have with your health care provider.

## 2013-12-07 NOTE — ED Notes (Signed)
Albuterol given per RT.

## 2013-12-09 ENCOUNTER — Encounter (HOSPITAL_COMMUNITY): Payer: Self-pay | Admitting: Emergency Medicine

## 2014-01-10 ENCOUNTER — Encounter (HOSPITAL_COMMUNITY): Payer: Self-pay | Admitting: Emergency Medicine

## 2014-01-10 ENCOUNTER — Emergency Department (HOSPITAL_COMMUNITY)
Admission: EM | Admit: 2014-01-10 | Discharge: 2014-01-10 | Disposition: A | Discharge status: Home / Self Care | Payer: Medicaid Other | Attending: Emergency Medicine | Admitting: Emergency Medicine

## 2014-01-10 DIAGNOSIS — Z79899 Other long term (current) drug therapy: Secondary | ICD-10-CM | POA: Insufficient documentation

## 2014-01-10 DIAGNOSIS — Z792 Long term (current) use of antibiotics: Secondary | ICD-10-CM | POA: Insufficient documentation

## 2014-01-10 DIAGNOSIS — Z7952 Long term (current) use of systemic steroids: Secondary | ICD-10-CM | POA: Insufficient documentation

## 2014-01-10 DIAGNOSIS — Z720 Tobacco use: Secondary | ICD-10-CM | POA: Insufficient documentation

## 2014-01-10 DIAGNOSIS — L0501 Pilonidal cyst with abscess: Secondary | ICD-10-CM | POA: Insufficient documentation

## 2014-01-10 MED ORDER — LIDOCAINE-EPINEPHRINE (PF) 2 %-1:200000 IJ SOLN: 10.0000 mL | Freq: Once | INTRAMUSCULAR | Status: DC

## 2014-01-10 MED ORDER — METRONIDAZOLE 500 MG PO TABS: 500.0000 mg | ORAL_TABLET | Freq: Two times a day (BID) | ORAL | Status: DC

## 2014-01-10 MED ORDER — CEPHALEXIN 500 MG PO CAPS: 500.0000 mg | ORAL_CAPSULE | Freq: Four times a day (QID) | ORAL | Status: DC

## 2014-01-10 MED ORDER — IBUPROFEN 800 MG PO TABS: 800.0000 mg | ORAL_TABLET | Freq: Three times a day (TID) | ORAL | Status: DC

## 2014-01-10 NOTE — ED Notes (Signed)
Pt c/o boil on her tail bone that has been thre since Monday. Pt denies any drainage. Pt standing in triage due to pain while sitting.

## 2014-01-10 NOTE — Discharge Instructions (Signed)
Pilonidal Cyst, Care After A pilonidal cyst occurs when hairs get trapped (ingrown) beneath the skin in the crease between the buttocks over your sacrum (the bone under that crease). Pilonidal cysts are most common in young men with a lot of body hair. When the cyst breaks(ruptured) or leaks, fluid from the cyst may cause burning and itching. If the cyst becomes infected, it causes a painful swelling filled with pus (abscess). The pus and trapped hairs need to be removed (often by lancing) so that the infection can heal. The word pilonidal means hair nest. HOME CARE INSTRUCTIONS If the pilonidal sinus was NOT DRAINING OR LANCED:  Keep the area clean and dry. Bathe or shower daily. Wash the area well with a germ-killing soap. Hot tub baths may help prevent infection. Dry the area well with a towel.  Avoid tight clothing in order to keep area as moisture-free as possible.  Keep area between buttocks as free from hair as possible. A depilatory may be used.  Take antibiotics as directed.  Only take over-the-counter or prescription medicines for pain, discomfort, or fever as directed by your caregiver. If the cyst WAS INFECTED AND NEEDED TO BE DRAINED:  Your caregiver may have packed the wound with gauze to keep the wound open. This allows the wound to heal from the inside outward and continue to drain.  Return as directed for a wound check.  If you take tub baths or showers, repack the wound with gauze as directed following. Sponge baths are a good alternative. Sitz baths may be used three to four times a day or as directed.  If an antibiotic was ordered to fight the infection, take as directed.  Only take over-the-counter or prescription medicines for pain, discomfort, or fever as directed by your caregiver.  If a drain was in place and removed, use sitz baths for 20 minutes 4 times per day. Clean the wound gently with mild unscented soap, pat dry, and then apply a dry dressing as  directed. If you had surgery and IT WAS MARSUPIALIZED (LEFT OPEN):  Your wound was packed with gauze to keep the wound open. This allows the wound to heal from the inside outwards and continue draining. The changing of the dressing regularly also helps keep the wound clean.  Return as directed for a wound check.  If you take tub baths or showers, repack the wound with gauze as directed following. Sponge baths are a good alternative. Sitz baths can also be used. This may be done three to four times a day or as directed.  If an antibiotic was ordered to fight the infection, take as directed.  Only take over-the-counter or prescription medicines for pain, discomfort, or fever as directed by your caregiver.  If you had surgery and the wound was closed you may care for it as directed. This generally includes keeping it dry and clean and dressing it as directed. SEEK MEDICAL CARE IF:   You have increased pain, swelling, redness, drainage, or bleeding from the area.  You have a fever.  You have muscles aches, dizziness, or a general ill feeling. Document Released: 02/24/2006 Document Revised: 09/26/2012 Document Reviewed: 05/11/2006 [facility] Patient Information 2015 [place], [place]. This information is not intended to replace advice given to you by your health care provider. Make sure you discuss any questions you have with your health care provider.  Emergency Department Resource Guide 1) Find a Doctor and Pay Out of Pocket Although you won't have to find out who  is covered by your insurance plan, it is a good idea to ask around and get recommendations. You will then need to call the office and see if the doctor you have chosen will accept you as a new patient and what types of options they offer for patients who are self-pay. Some doctors offer discounts or will set up payment plans for their patients who do not have insurance, but you will need to ask so you aren't surprised when you get to  your appointment.  2) Contact Your Local Health Department Not all health departments have doctors that can see patients for sick visits, but many do, so it is worth a call to see if yours does. If you don't know where your local health department is, you can check in your phone book. The CDC also has a tool to help you locate your state's health department, and many state websites also have listings of all of their local health departments.  3) Find a [facility] If your illness is not likely to be very severe or complicated, you may want to try a walk in clinic. These are popping up all over the country in pharmacies, drugstores, and shopping centers. They're usually staffed by nurse practitioners or physician assistants that have been trained to treat common illnesses and complaints. They're usually fairly quick and inexpensive. However, if you have serious medical issues or chronic medical problems, these are probably not your best option.  No Primary Care Doctor: - Call Health Connect at  [phone] - they can help you locate a primary care doctor that  accepts your insurance, provides certain services, etc. - Physician Referral Service- [phone]  Chronic Pain Problems: Organization         Address  Phone   Notes  [facility]  [phone] Patients need to be referred by their primary care doctor.   Medication Assistance: Organization         Address  Phone   Notes  [facility] [address] [place], [place] [address] [phone] --Must be a resident of [facility] -- Must have NO insurance coverage whatsoever (no Medicaid/ Medicare, etc.) -- The pt. MUST have a primary care doctor that directs their care regularly and follows them in the community   MedAssist  [phone]   [employer]  [phone]    Agencies that provide inexpensive medical care: Organization         Address  Phone    Notes  [place]  [phone]   [facility]    [phone]   [facility] [place], [place] [address] [phone]   [place] [address], [place] [phone]   Planned Parenthood    [phone]   [facility]    [phone]   [facility] [address], [place] Phone:  [phone], Fax:  [phone] Hours of Operation:  9 am - 6 pm, M-F.  Also accepts Medicaid/Medicare and self-pay.  [facility] for [address], [address], [place] Phone: [phone], Fax: [phone]. Hours of Operation:  8:30 am - 5:30 pm, M-F.  Also accepts Medicaid and self-pay.  [facility] [place] [address] [place] Phone: [phone]   [place], [place], [place] [phone],  Ext. [phone] Mondays & Thursdays: 7-9 AM.  First 15 patients are seen on a first come, first serve basis.    [place] Providers:  Organization         Address  Phone   Notes  [facility] [address], [place] [phone] Also accepts self-pay patients.  [facility] [address], [place]  [phone]   [place], [address], [place] [phone]   [facility] [address], [place] [phone]   Lucianne Lei [address], [place]   [phone] Only accepts [place] Access [place] patients after they have their name applied to their card.   Self-Pay (no insurance) in [facility]:  Organization         Address  Phone   Notes  Sickle Cell Patients, [facility] [place] [phone]   [facility]gent Care [place] [phone]   [facility] Pont[facility]  Urgent Care [place]  [place], [place], [place] [phone]   [facility]/Dr. Osei-Bonsu  [address], [place] or [address], [place] [phone] Phone number for both [place] and [place] locations is the same.  Urgent Medical and [facility] [address] [place] [phone]   [facility] [address] [place] or [address] [phone]   [facility] [address], [place] [phone], phone; [phone], fax Sees patients 1st and 3rd Saturday of every month.  Must not qualify for public or private insurance (i.e. Medicaid, Medicare, [place] Health Choice, Veterans' Benefits)  Household income should be no more than 200% of the poverty level The clinic cannot treat you if you are pregnant or think you are pregnant  Sexually transmitted diseases are not treated at the clinic.    Dental Care: Organization         Address  Phone  Notes  [facility] [place] [phone] Accepts children up to age 75 who are enrolled in [place] or [place]; pregnant women with a Medicaid card; and children who have applied for Medicaid or [place] Health Choice, but were declined, whose parents can pay a reduced fee at time of service.  [facility] Department of [facility]  [address], [place] [phone] Accepts children up to age 54 who are enrolled in [place] or [place]; pregnant women with a Medicaid card; and children who have applied for Medicaid or [place] Health Choice, but were declined, whose parents can pay a reduced fee at time of service.  [facility] Adult [facility] Access PROGRAM  [place] [phone] Patients are seen by appointment only. Walk-ins are not accepted. [place] will see patients 43 years of age  and older. Monday - Tuesday (8am-5pm) Most Wednesdays (8:30-5pm) $30 per visit, cash only  [facility] Adult Dental Access PROGRAM  [address], [facility] [phone] Patients are seen by appointment only. Walk-ins are not accepted. [place] will see patients 73 years of age and older. One Wednesday Evening (Monthly: Volunteer Based).  $30 per visit, cash only  [place]  [phone]  for adults; Children under age 84, call Graduate Pediatric Dentistry at [phone]. Children aged 39-14, please call [phone] to request a pediatric application.  Dental services are provided in all areas of dental care including fillings, crowns and bridges, complete and partial dentures, implants, gum treatment, root canals, and extractions. Preventive care is also provided. Treatment is provided to both adults and children. Patients are selected via a lottery and there is often a waiting list.   [facility] [address], [place]  [phone] [email or web address]   [facility] [address] [place], [place] [phone], Ext. [phone] Second and Fourth Thursday of each month, opens at 6:30 AM; Clinic ends at 9 AM.  Patients are seen on a first-come first-served basis, and a limited number are seen during each clinic.   [facility]  [address] Hillard Danker [place], [place] [phone]   Eligibility Requirements You must have lived in [place], [place], or [place] counties for at least the last three months.   You cannot be eligible for state or federal sponsored [employer], including [employer], [place], or [employer].   You generally cannot be eligible for healthcare insurance through your employer.    How to apply: Eligibility screenings are held every Tuesday and Wednesday afternoon from 1:00 pm until 4:00 pm. You do not need an appointment for the interview!  [facility] [address], [place], [place]   [place]  [place] Department  [place]  [phone]    Behavioral Health Resources in the Community: Intensive Outpatient Programs Organization         Address  Phone  Notes  [place]. [address], [place], [place] [phone]   [facility] Outpatient [address], [place], [place]   ADS: Alcohol & Drug S[facility] [address] [place], [place]   [place] [address],  [place], [place] or [phone]   Substance Abuse Resources Organization         Address  Phone  Notes  Alcohol and Drug Services  [phone]   [place]  [phone]   The [place]   Chinita Pester  [phone]   Residential & Outpatient Substance Abuse Program  [phone]   Psychological Services Organization         Address  Phone  Notes  [facility]  [place]  [phone]   [place] [address], [place] or [phone]    Mobile Crisis Teams Organization         Address  Phone  Notes  [facility], Mobile Crisis Care Unit  [phone]   [facility]  [address] [place], [place]   [place] [address] [place] [phone]    Self-Help/Support Groups Organization         Address  Phone             Notes  [place]. of [place] - variety of support groups  [place] Call for more information  Narcotics Anonymous (NA), [facility] [address], [employer] [place]  2 meetings at this location   Brewing technologist  Notes  ASAP  Residential Treatment [address],    [place]  [phone]   [facility]  [address], [place] 127517, [place], [place]   [place],  [phone] Admissions: 8am-3pm M-F  Incentives Substance [place] [phone]    [place], [place] [phone]   [facility] [address] [place], [place], [place]   [facility] [address],  [place], [place]   [facility] Programs - Intensive Outpatient [address] Kristeen [address], [place], [place]   [facility] ([place] [address].,  [place], [place] [phone] or [phone]   [facility] Treatment Services (RTS) [address], [place], [place] Accepts Medicaid  [facility] [place] [address],  [place] [phone] Substance Abuse/Addiction Treatment   [facility] Organization         Address  Phone  Notes  [facility]  [phone]   Domenic Schwab, PhD [address] Arlis Porta [place], [place]   [phone] or [phone]   [place], [place] [phone]   [facility] [address], [place], [place] [phone] Insurance/Medicaid/sponsorship through [facility] and Families [address], [address] [place]                                    [place], [place] [phone] [place] [address] Recovery, [place] [phone]    Dr. Adele Schilder  [phone]   [facility] [place] Dept. 1) [address] [place] 2) [place] 3)  [place] [address], [place] [phone]  [phone]   [place] [phone] or [phone] (After Hours)

## 2014-01-10 NOTE — ED Provider Notes (Signed)
CSN: [id number]     Arrival date & time 01/10/14  1633 History   First MD Initiated Contact with Patient 01/10/14 1706     Chief Complaint  Patient presents with  . Recurrent Skin Infections     (Consider location/radiation/quality/duration/timing/severity/associated sxs/prior Treatment) HPI For about 5 days the patient has been developing a swollen painful boil at her tailbone area. She reports she's had an abscess there in the past. She is not had any treatment for this time. Any pressure is very painful. No associated symptoms. No associated fever chills. No associated constitutional symptoms. Past Medical History  Diagnosis Date  . Vitamin D deficiency   . H/O pilonidal cyst    Past Surgical History  Procedure Laterality Date  . No past surgeries     Family History  Problem Relation Age of Onset  . Hypertension Mother   . Diabetes Mother   . Asthma Mother   . COPD Mother   . Depression Mother   . Miscarriages / [place] Mother   . Vision loss Mother   . Parkinson's disease Father    History  Substance Use Topics  . Smoking status: Current Some Day Smoker -- 0.50 packs/day    Types: Cigarettes  . Smokeless tobacco: Never Used  . Alcohol Use: No   OB History    Gravida Para Term Preterm AB TAB SAB Ectopic Multiple Living   5 3 3  2  1 1  3      Review of Systems Constitutional: No fever no chills no general malaise   Allergies  Review of patient's allergies indicates no known allergies.  Home Medications   Prior to Admission medications   Medication Sig Start Date End Date Taking? Authorizing Provider  albuterol (PROVENTIL HFA;VENTOLIN HFA) 108 (90 BASE) MCG/ACT inhaler Inhale 1-2 puffs into the lungs every 4 (four) hours as needed for wheezing or shortness of breath. 12/06/13  Yes Kalman Drape, MD  Prenatal Vit-Fe Fumarate-FA (PRENATAL MULTIVITAMIN) TABS tablet Take 1 tablet by mouth daily at 12 noon.   Yes Historical Provider, MD  traMADol (ULTRAM) 50 MG  tablet Take 50 mg by mouth every 6 (six) hours as needed for moderate pain.   Yes Historical Provider, MD  azithromycin (ZITHROMAX) 250 MG tablet Take 1 tablet (250 mg total) by mouth daily. Take first 2 tablets together, then 1 every day until finished. Patient not taking: Reported on 01/10/2014 11/30/13   Varney Biles, MD  benzonatate (TESSALON) 100 MG capsule Take 1 capsule (100 mg total) by mouth every 8 (eight) hours. Patient not taking: Reported on 01/10/2014 12/07/13   Kalman Drape, MD  cephALEXin (KEFLEX) 500 MG capsule Take 1 capsule (500 mg total) by mouth 4 (four) times daily. 01/10/14   Charlesetta Shanks, MD  chlorpheniramine-HYDROcodone (TUSSIONEX) 10-8 MG/5ML LQCR Take 5 mLs by mouth every 12 (twelve) hours as needed for cough. Patient not taking: Reported on 01/10/2014 12/06/13   Kalman Drape, MD  fluticasone [facility]) 50 MCG/ACT nasal spray Place 2 sprays into both nostrils daily. Patient not taking: Reported on 01/10/2014 11/27/13   Noland Fordyce, PA-C  ibuprofen (ADVIL,MOTRIN) 800 MG tablet Take 1 tablet (800 mg total) by mouth 3 (three) times daily. 01/10/14   Charlesetta Shanks, MD  levofloxacin (LEVAQUIN) 750 MG tablet Take 1 tablet (750 mg total) by mouth once. Patient not taking: Reported on 01/10/2014 12/06/13   Kalman Drape, MD  metroNIDAZOLE (FLAGYL) 500 MG tablet Take 1 tablet (500 mg total) by mouth  2 (two) times daily. One po bid x 7 days 01/10/14   Charlesetta Shanks, MD  predniSONE (DELTASONE) 50 MG tablet Take 1 tablet (50 mg total) by mouth daily. Patient not taking: Reported on 01/10/2014 11/30/13   Varney Biles, MD   BP 148/87 mmHg  Pulse 100  Temp(Src) 99 F (37.2 C) (Oral)  Resp 18  SpO2 99%  LMP 01/08/2014 Physical Exam  Constitutional: She is oriented to person, place, and time.  Patient is a moderately obese well-nourished well-developed female she is nontoxic and alert in appearance.  HENT:  Head: Normocephalic and atraumatic.  Eyes: EOM are normal.   Pulmonary/Chest: Effort normal.  Genitourinary:  At the left top of the gluteal cleft the patient has a 1 cm fluctuant abscess. The area is very tender. There is not surrounding cellulitis.  Musculoskeletal: Normal range of motion.  Neurological: She is alert and oriented to person, place, and time.  Skin: Skin is warm and dry.  Psychiatric: She has a normal mood and affect.    ED Course  Procedures (including critical care time) Labs Review Labs Reviewed - No data to display  Imaging Review No results found.   EKG Interpretation None     INCISION AND DRAINAGE Performed by: Charlesetta Shanks Consent: Verbal consent obtained. Risks and benefits: risks, benefits and alternatives were discussed Type: abscess  Body area: Left top of gluteal cleft  Anesthesia: local infiltration  Incision was made with a scalpel.  Local anesthetic: lidocaine 2%  epinephrine  Anesthetic total:1 ml  Complexity: complex Blunt dissection to break up loculations  Drainage: Copious purulent  Drainage amount: 5 ml  Packing material: 1/4 in iodoform gauze  Patient tolerance: Patient tolerated the procedure well with no immediate complications.   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 It was incised with significant purulent discharge.  Patient is given signs and symptoms for which return.    Charlesetta Shanks, MD 01/10/14 [phone]

## 2014-01-13 ENCOUNTER — Encounter (HOSPITAL_COMMUNITY): Payer: Self-pay | Admitting: Emergency Medicine

## 2014-01-13 ENCOUNTER — Emergency Department (HOSPITAL_COMMUNITY)
Admission: EM | Admit: 2014-01-13 | Discharge: 2014-01-13 | Disposition: A | Discharge status: Home / Self Care | Payer: Medicaid Other | Attending: Emergency Medicine | Admitting: Emergency Medicine

## 2014-01-13 DIAGNOSIS — Z791 Long term (current) use of non-steroidal anti-inflammatories (NSAID): Secondary | ICD-10-CM | POA: Insufficient documentation

## 2014-01-13 DIAGNOSIS — Z8639 Personal history of other endocrine, nutritional and metabolic disease: Secondary | ICD-10-CM | POA: Insufficient documentation

## 2014-01-13 DIAGNOSIS — Z79899 Other long term (current) drug therapy: Secondary | ICD-10-CM | POA: Insufficient documentation

## 2014-01-13 DIAGNOSIS — L0501 Pilonidal cyst with abscess: Secondary | ICD-10-CM | POA: Insufficient documentation

## 2014-01-13 DIAGNOSIS — Z720 Tobacco use: Secondary | ICD-10-CM | POA: Insufficient documentation

## 2014-01-13 DIAGNOSIS — Z7951 Long term (current) use of inhaled steroids: Secondary | ICD-10-CM | POA: Insufficient documentation

## 2014-01-13 DIAGNOSIS — Z7952 Long term (current) use of systemic steroids: Secondary | ICD-10-CM | POA: Insufficient documentation

## 2014-01-13 DIAGNOSIS — Z792 Long term (current) use of antibiotics: Secondary | ICD-10-CM | POA: Insufficient documentation

## 2014-01-13 NOTE — ED Provider Notes (Signed)
CSN: [id number]     Arrival date & time 01/13/14  1749 History  This chart was scribed for non-physician practitioner working with Hoy Morn, MD by Mercy Moore, ED Scribe. This patient was seen in room WTR5/WTR5 and the patient's care was started at 8:03 PM.   Chief Complaint  Patient presents with  . cyst follow-up    The history is provided by the patient. No language interpreter was used.   HPI Comments: Emily Saunders is a 38 y.o. female who presents to the Emergency Department for re evaluation and packing removal from a pilonidal cyst to left gluteal cleft that was incised and drained 01/10/14 here at [facility] ED; packed with 1/4 iodoform gauze. Patient's history indicates recurrent infections. Patient denies complications. Patient reports that the cyst has continued to drain. Patient reports that she is taking her antibiotics as directed. Patient denies fevers.   Past Medical History  Diagnosis Date  . Vitamin D deficiency   . H/O pilonidal cyst    Past Surgical History  Procedure Laterality Date  . No past surgeries     Family History  Problem Relation Age of Onset  . Hypertension Mother   . Diabetes Mother   . Asthma Mother   . COPD Mother   . Depression Mother   . Miscarriages / [place] Mother   . Vision loss Mother   . Parkinson's disease Father    History  Substance Use Topics  . Smoking status: Current Some Day Smoker -- 0.50 packs/day    Types: Cigarettes  . Smokeless tobacco: Never Used  . Alcohol Use: No   OB History    Gravida Para Term Preterm AB TAB SAB Ectopic Multiple Living   5 3 3  2  1 1  3      Review of Systems  Constitutional: Negative for fever and chills.  Skin: Positive for color change and wound.    Allergies  Review of patient's allergies indicates no known allergies.  Home Medications   Prior to Admission medications   Medication Sig Start Date End Date Taking? Authorizing Provider  albuterol (PROVENTIL HFA;VENTOLIN HFA) 108  (90 BASE) MCG/ACT inhaler Inhale 1-2 puffs into the lungs every 4 (four) hours as needed for wheezing or shortness of breath. 12/06/13   Kalman Drape, MD  azithromycin (ZITHROMAX) 250 MG tablet Take 1 tablet (250 mg total) by mouth daily. Take first 2 tablets together, then 1 every day until finished. Patient not taking: Reported on 01/10/2014 11/30/13   Varney Biles, MD  benzonatate (TESSALON) 100 MG capsule Take 1 capsule (100 mg total) by mouth every 8 (eight) hours. Patient not taking: Reported on 01/10/2014 12/07/13   Kalman Drape, MD  cephALEXin (KEFLEX) 500 MG capsule Take 1 capsule (500 mg total) by mouth 4 (four) times daily. 01/10/14   Charlesetta Shanks, MD  chlorpheniramine-HYDROcodone (TUSSIONEX) 10-8 MG/5ML LQCR Take 5 mLs by mouth every 12 (twelve) hours as needed for cough. Patient not taking: Reported on 01/10/2014 12/06/13   Kalman Drape, MD  fluticasone [facility]) 50 MCG/ACT nasal spray Place 2 sprays into both nostrils daily. Patient not taking: Reported on 01/10/2014 11/27/13   Noland Fordyce, PA-C  ibuprofen (ADVIL,MOTRIN) 800 MG tablet Take 1 tablet (800 mg total) by mouth 3 (three) times daily. 01/10/14   Charlesetta Shanks, MD  levofloxacin (LEVAQUIN) 750 MG tablet Take 1 tablet (750 mg total) by mouth once. Patient not taking: Reported on 01/10/2014 12/06/13   Kalman Drape, MD  metroNIDAZOLE (FLAGYL) 500 MG tablet Take 1 tablet (500 mg total) by mouth 2 (two) times daily. One po bid x 7 days 01/10/14   Charlesetta Shanks, MD  predniSONE (DELTASONE) 50 MG tablet Take 1 tablet (50 mg total) by mouth daily. Patient not taking: Reported on 01/10/2014 11/30/13   Varney Biles, MD  Prenatal Vit-Fe Fumarate-FA (PRENATAL MULTIVITAMIN) TABS tablet Take 1 tablet by mouth daily at 12 noon.    Historical Provider, MD  traMADol (ULTRAM) 50 MG tablet Take 50 mg by mouth every 6 (six) hours as needed for moderate pain.    Historical Provider, MD   Triage Vitals: BP 130/85 mmHg  Pulse 72  Temp(Src) 98.1  F (36.7 C) (Oral)  Resp 16  SpO2 100%  LMP 01/08/2014 Physical Exam  Constitutional: She is oriented to person, place, and time. She appears well-developed and well-nourished. No distress.  HENT:  Head: Normocephalic and atraumatic.  Eyes: EOM are normal.  Neck: Neck supple. No tracheal deviation present.  Cardiovascular: Normal rate.   Pulmonary/Chest: Effort normal. No respiratory distress.  Musculoskeletal: Normal range of motion.  Neurological: She is alert and oriented to person, place, and time.  Skin: Skin is warm and dry.  2cm linear incision. Packing was removed with removal of dressing. Minimal drainage. No palpable fluctuance. No surrounding erythema.   Psychiatric: She has a normal mood and affect. Her behavior is normal.  Nursing note and vitals reviewed.   ED Course  Procedures (including critical care time)  COORDINATION OF CARE: 8:09 PM- Discussed treatment plan with patient at bedside and patient agreed to plan.   Labs Review Labs Reviewed - No data to display  Imaging Review No results found.   EKG Interpretation None      MDM   Final diagnoses:  Pilonidal abscess   Patient is a 38 year old female who presents to the emergency room for evaluation of a wound from previous incision and drainage on 01/10/14.  Physical exam reveals no evidence of worsening infection. There is no palpable fluctuance at this time. There is minimal drainage. Packing was removed when I removed her dressing. I have redressed the wound.  I have told her that she can shower like normal. I have also encouraged her to continue use warm compresses. Sits baths were also encouraged. Patient was told to return for worsening signs of infection. I have re-indurated that she should finish her course of antibiotics. Patient is stable for discharge at this time. Resource list was provided on previous discharge.  I personally performed the services described in this documentation, which was  scribed in my presence. The recorded information has been reviewed and is accurate.    Cherylann Parr, PA-C 01/13/14 2013  Hoy Morn, MD 01/14/14 [phone]

## 2014-01-13 NOTE — Discharge Instructions (Signed)
Abscess Care After An abscess (also called a boil or furuncle) is an infected area that contains a collection of pus. Signs and symptoms of an abscess include pain, tenderness, redness, or hardness, or you may feel a moveable soft area under your skin. An abscess can occur anywhere in the body. The infection may spread to surrounding tissues causing cellulitis. A cut (incision) by the surgeon was made over your abscess and the pus was drained out. Gauze may have been packed into the space to provide a drain that will allow the cavity to heal from the inside outwards. The boil may be painful for 5 to 7 days. Most people with a boil do not have high fevers. Your abscess, if seen early, may not have localized, and may not have been lanced. If not, another appointment may be required for this if it does not get better on its own or with medications. HOME CARE INSTRUCTIONS   Only take over-the-counter or prescription medicines for pain, discomfort, or fever as directed by your caregiver.  When you bathe, soak and then remove gauze or iodoform packs at least daily or as directed by your caregiver. You may then wash the wound gently with mild soapy water. Repack with gauze or do as your caregiver directs. SEEK IMMEDIATE MEDICAL CARE IF:   You develop increased pain, swelling, redness, drainage, or bleeding in the wound site.  You develop signs of generalized infection including muscle aches, chills, fever, or a general ill feeling.  An oral temperature above 102 F (38.9 C) develops, not controlled by medication. See your caregiver for a recheck if you develop any of the symptoms described above. If medications (antibiotics) were prescribed, take them as directed. Document Released: 08/12/2004 Document Revised: 04/18/2011 Document Reviewed: 04/09/2007 [facility] Patient Information 2015 [place], [place]. This information is not intended to replace advice given to you by your health care provider. Make sure  you discuss any questions you have with your health care provider.   Emergency Department Resource Guide 1) Find a Doctor and Pay Out of Pocket Although you won't have to find out who is covered by your insurance plan, it is a good idea to ask around and get recommendations. You will then need to call the office and see if the doctor you have chosen will accept you as a new patient and what types of options they offer for patients who are self-pay. Some doctors offer discounts or will set up payment plans for their patients who do not have insurance, but you will need to ask so you aren't surprised when you get to your appointment.  2) Contact Your Local Health Department Not all health departments have doctors that can see patients for sick visits, but many do, so it is worth a call to see if yours does. If you don't know where your local health department is, you can check in your phone book. The CDC also has a tool to help you locate your state's health department, and many state websites also have listings of all of their local health departments.  3) Find a [facility] If your illness is not likely to be very severe or complicated, you may want to try a walk in clinic. These are popping up all over the country in pharmacies, drugstores, and shopping centers. They're usually staffed by nurse practitioners or physician assistants that have been trained to treat common illnesses and complaints. They're usually fairly quick and inexpensive. However, if you have serious medical issues  or chronic medical problems, these are probably not your best option.  No Primary Care Doctor: - Call Health Connect at  [phone] - they can help you locate a primary care doctor that  accepts your insurance, provides certain services, etc. - Physician Referral Service- [phone]  Chronic Pain Problems: Organization         Address  Phone   Notes  [facility]  [phone] Patients need  to be referred by their primary care doctor.   Medication Assistance: Organization         Address  Phone   Notes  [facility] Medication [facility] [address] [place], [place] [address] [phone] --Must be a resident of [facility] -- Must have NO insurance coverage whatsoever (no Medicaid/ Medicare, etc.) -- The pt. MUST have a primary care doctor that directs their care regularly and follows them in the community   MedAssist  [phone]   [employer]  [phone]    Agencies that provide inexpensive medical care: Organization         Address  Phone   Notes  [place]  [phone]   [facility]    [phone]   [facility] [place], [place] [address] [phone]   [place] [address] [place] [phone]   Planned Parenthood    [phone]   [facility]    [phone]   [place] and [address], [place] Phone:  [phone], Fax:  [phone] Hours of Operation:  9 am - 6 pm, M-F.  Also accepts Medicaid/Medicare and self-pay.  [facility] for [place], [address], [place] Phone: [phone], Fax: [phone]. Hours of Operation:  8:30 am - 5:30 pm, M-F.  Also accepts Medicaid and self-pay.  [facility] [place] [address], [place] Phone: [phone]   [place], [place], [place] [phone], Ext. [phone] Mondays & Thursdays: 7-9 AM.  First 15 patients are seen on a first come, first serve basis.    [place] Providers:  Organization         Address  Phone   Notes  [facility] [address], [place] [phone] Also accepts self-pay patients.  [facility] [address], [place]  [phone]   [place], [address], [place] [phone]   [facility] [address], [place] [phone]   Lucianne Lei [address], [place]   [phone] Only accepts [place] Access [place] patients after they have their name applied to their card.   Self-Pay (no insurance) in [facility]:  Organization         Address  Phone   Notes  Sickle Cell Patients, [facility] [place] [phone]   [facility] Urgent Care [place] [phone]   [facility] [place]  [place], [place], [place] [phone]   [facility]/Dr. Osei-Bonsu  [address] [place] or [address], [place] [phone] Phone number for both [place] and [place] locations is the same.  [facility] [address] [place] [phone]   [facility] [address] [place] or [address] [phone]   [facility] [address] [place] [phone], phone; [phone], fax Sees patients 1st and 3rd Saturday of every month.  Must not qualify for public or private insurance (i.e. Medicaid, Medicare, [place] Health Choice, Veterans' Benefits)  Household income should be no more than 200% of the poverty level The clinic cannot treat you if you are pregnant or think you are pregnant  Sexually transmitted diseases are not treated at the clinic.    Dental Care: Organization         Address  Phone  Notes  [facility] Department of [facility] [place] [phone] Accepts children up to age 48 who are enrolled in [place] or [facility]; pregnant women with a Medicaid card; and children who have applied for Medicaid or [place] Health Choice, but were declined, whose  parents can pay a reduced fee at time of service.  [facility] Department of [facility]  [address], [place] [phone] Accepts children up to age 56 who are enrolled in [place] or [place]; pregnant women with a Medicaid card; and children who have applied for Medicaid or [place] Health Choice, but were declined, whose parents can pay a reduced fee at time of service.  [place] Adult Dental Access PROGRAM  [place] [phone] Patients are seen by appointment only. Walk-ins are not accepted. [place] will see patients 80 years of age and older. Monday - Tuesday (8am-5pm) Most Wednesdays (8:30-5pm) $30 per visit, cash only  [facility] Adult Dental Access PROGRAM  [address], [facility] [phone] Patients are seen by appointment only. Walk-ins are not accepted. [place] will see patients 73 years of age and older. One Wednesday Evening (Monthly: Volunteer Based).  $30 per visit, cash only  [place]  [phone] for adults; Children under age 23, call Graduate Pediatric Dentistry at [phone]. Children aged 63-14, please call [phone] to request a pediatric application.  Dental services are provided in all areas of dental care including fillings, crowns and bridges, complete and partial dentures, implants, gum treatment, root canals, and extractions. Preventive care is also provided. Treatment is provided to both adults and children. Patients are selected via a lottery and there is often a waiting list.   [facility] [address] [place]  [phone] [email or web address]   [facility] [address] [place], [place] [phone], Ext. [phone] Second and Fourth Thursday of each month, opens at 6:30 AM; Clinic ends at 9 AM.  Patients are seen on a first-come first-served basis, and a limited number are seen during each clinic.   [facility]   [address] Hillard Danker [place], [place] [phone]   Eligibility Requirements You must have lived in [place], [place], or [place] counties for at least the last three months.   You cannot be eligible for state or federal sponsored [employer], including [employer], [place], or [employer].   You generally cannot be eligible for healthcare insurance through your employer.    How to apply: Eligibility screenings are held every Tuesday and Wednesday afternoon from 1:00 pm until 4:00 pm. You do not need an appointment for the interview!  [facility] [address] [place], [place]   [place]  [place] Department  [place]  [phone]    Behavioral Health Resources in the Community: Intensive Outpatient Programs Organization         Address  Phone  Notes  [place]. [address], [place], [place] [phone]   [facility] Outpatient [address] [place], [place]   ADS: Alcohol & Drug Svcs [address], [place], [place]   [place] [address]  [place], [place] or [phone]   Substance Abuse Resources Organization         Address  Phone  Notes  Alcohol and Drug Services  [phone]   [place]  [phone]   The [place]   Chinita Pester  [phone]   Residential & Outpatient Substance Abuse Program  [phone]   Psychological Services Organization         Address  Phone  Notes  [facility] [place]  [place]  [phone]   [place] [address] [place] [address], [place] or [phone]    Mobile Crisis Teams Organization         Address  Phone  Notes  [facility], Mobile Crisis Care Unit  [phone]     Assertive Psychotherapeutic Services  [address] [place], [place]   [place] [address], [place] [phone]    Self-Help/Support Groups Organization         Address  Phone             Notes  [place]. of [place] - variety of support groups  [place] Call for more information  Narcotics Anonymous (NA), [facility] [address], [employer] [place]  2 meetings at this location   Special educational needs teacher         Address  Phone  Notes  [facility]AP Residential Treatment [place],    [place]  [phone]   [facility]  [address] [place] [address], [place], [place]   [place], [place] [phone] Admissions: 8am-3pm M-F  Incentives Substance [place] [address]    [place], [place] [phone]   [facility] [address] [place], [place], [place]   [facility] [address],  [place], [place]   [facility]ight Programs - Intensive Outpatient [address] Kristeen [address], [place], [place]   [facility] ([place].,  [place], [place] [phone] or [phone]   [facility]idential Treatment Services (RTS) [address], [place], [place] Accepts Medicaid  [facility] [place] [address],  [place] [phone] Substance Abuse/Addiction Treatment   [facility] Organization         Address  Phone  Notes  [facility]  [phone]   Domenic Schwab, PhD [address] Arlis Porta [place], [place]   [phone] or [phone]   [facility] [address] [place], [place] [phone]   [place] [address] [place], [place] [phone] Insurance/Medicaid/sponsorship through [employer] and Families [address], [address] [address]  [place], [place] [phone] [place], [place] [phone]    Dr. Adele Schilder  [phone]   [facility] Dept. 1) [address] [place] 2) [place] 3)  [address], [place] [phone]  [phone]   [place] [phone] or [phone] (After Hours)

## 2014-01-13 NOTE — ED Notes (Signed)
Pt was seen here Friday to have abscess drained and packed. Pt is back again today for follow up.

## 2014-01-13 NOTE — ED Notes (Signed)
Per pt, here for a follow-up of her cyst located on her tail bone

## 2014-01-22 ENCOUNTER — Ambulatory Visit: Payer: Medicaid Other | Attending: Internal Medicine

## 2014-02-07 DIAGNOSIS — J189 Pneumonia, unspecified organism: Secondary | ICD-10-CM

## 2014-02-07 HISTORY — DX: Pneumonia, unspecified organism: J18.9

## 2014-03-03 ENCOUNTER — Ambulatory Visit: Payer: Medicaid Other | Attending: Family Medicine | Admitting: Family Medicine

## 2014-03-03 ENCOUNTER — Encounter: Payer: Self-pay | Admitting: Family Medicine

## 2014-03-03 VITALS — BP 128/84 | HR 81 | Temp 98.3°F | Resp 18 | Ht 66.0 in | Wt 227.2 lb

## 2014-03-03 DIAGNOSIS — F17200 Nicotine dependence, unspecified, uncomplicated: Secondary | ICD-10-CM

## 2014-03-03 DIAGNOSIS — E669 Obesity, unspecified: Secondary | ICD-10-CM

## 2014-03-03 DIAGNOSIS — E559 Vitamin D deficiency, unspecified: Secondary | ICD-10-CM

## 2014-03-03 DIAGNOSIS — Z23 Encounter for immunization: Secondary | ICD-10-CM

## 2014-03-03 DIAGNOSIS — D649 Anemia, unspecified: Secondary | ICD-10-CM

## 2014-03-03 DIAGNOSIS — Z87891 Personal history of nicotine dependence: Secondary | ICD-10-CM | POA: Insufficient documentation

## 2014-03-03 DIAGNOSIS — S025XXA Fracture of tooth (traumatic), initial encounter for closed fracture: Secondary | ICD-10-CM

## 2014-03-03 DIAGNOSIS — Z720 Tobacco use: Secondary | ICD-10-CM | POA: Insufficient documentation

## 2014-03-03 DIAGNOSIS — L0591 Pilonidal cyst without abscess: Secondary | ICD-10-CM

## 2014-03-03 DIAGNOSIS — G478 Other sleep disorders: Secondary | ICD-10-CM

## 2014-03-03 LAB — COMPLETE METABOLIC PANEL WITH GFR
ALK PHOS: 38 U/L — AB (ref 39–117)
ALT: 12 U/L (ref 0–35)
AST: 14 U/L (ref 0–37)
Albumin: 4.5 g/dL (ref 3.5–5.2)
BILIRUBIN TOTAL: 0.3 mg/dL (ref 0.2–1.2)
BUN: 9 mg/dL (ref 6–23)
CHLORIDE: 108 meq/L (ref 96–112)
CO2: 25 mEq/L (ref 19–32)
CREATININE: 0.91 mg/dL (ref 0.50–1.10)
Calcium: 9.7 mg/dL (ref 8.4–10.5)
GFR, EST NON AFRICAN AMERICAN: 80 mL/min
GFR, Est African American: >89 mL/min
Glucose, Bld: 90 mg/dL (ref 70–99)
Potassium: 4.5 mEq/L (ref 3.5–5.3)
Sodium: 144 mEq/L (ref 135–145)
TOTAL PROTEIN: 7.2 g/dL (ref 6.0–8.3)

## 2014-03-03 LAB — CBC
HCT: 39.7 % (ref 36.0–46.0)
Hemoglobin: 13.4 g/dL (ref 12.0–15.0)
MCH: 29.8 pg (ref 26.0–34.0)
MCHC: 33.8 g/dL (ref 30.0–36.0)
MCV: 88.4 fL (ref 78.0–100.0)
MPV: 12.1 fL (ref 8.6–12.4)
PLATELETS: 234 10*3/uL (ref 150–400)
RBC: 4.49 MIL/uL (ref 3.87–5.11)
RDW: 14.5 % (ref 11.5–15.5)
WBC: 6.2 10*3/uL (ref 4.0–10.5)

## 2014-03-03 MED ORDER — VARENICLINE TARTRATE 1 MG PO TABS: 1.0000 mg | ORAL_TABLET | Freq: Two times a day (BID) | ORAL | Status: DC

## 2014-03-03 MED ORDER — VARENICLINE TARTRATE 0.5 MG X 11 & 1 MG X 42 PO MISC: ORAL | Status: DC

## 2014-03-03 NOTE — Assessment & Plan Note (Signed)
A: broken teeth in smoker.  P:  Smoking cessation Dental referral

## 2014-03-03 NOTE — Addendum Note (Signed)
Addended by: Boykin Nearing on: 03/03/2014 03:57 PM   Modules accepted: Orders

## 2014-03-03 NOTE — Patient Instructions (Addendum)
Emily Saunders,  Thank you for coming in today. It was a pleasure meeting you. I look forward to being your primary doctor.   1. Pilonidal cyst:  We will plan for cyst removal in office.   2. Smoking cessation support: smoking cessation hotline: 1-800-QUIT-NOW.  Smoking cessation classes are available through [facility]. Call [phone] or visit our website at [email or web address] Start chantix  Starter pack, then continuation pack. Set quit date for 7 days after starting chantix.   3. Broken teeth: dental referral placed.  You will be called with lab results  F/u in 3 weeks for pilonidal cyst removal.   Dr. Adrian Blackwater    Pilonidal Cyst A pilonidal cyst occurs when hairs get trapped (ingrown) beneath the skin in the crease between the buttocks over your sacrum (the bone under that crease). Pilonidal cysts are most common in young men with a lot of body hair. When the cyst is ruptured (breaks) or leaking, fluid from the cyst may cause burning and itching. If the cyst becomes infected, it causes a painful swelling filled with pus (abscess). The pus and trapped hairs need to be removed (often by lancing) so that the infection can heal. However, recurrence is common and an operation may be needed to remove the cyst. HOME CARE INSTRUCTIONS   If the cyst was NOT INFECTED:  Keep the area clean and dry. Bathe or shower daily. Wash the area well with a germ-killing soap. Warm tub baths may help prevent infection and help with drainage. Dry the area well with a towel.  Avoid tight clothing to keep area as moisture free as possible.  Keep area between buttocks as free of hair as possible. A depilatory may be used.  If the cyst WAS INFECTED and needed to be drained:  Your caregiver packed the wound with gauze to keep the wound open. This allows the wound to heal from the inside outwards and continue draining.  Return for a wound check in 1 day or as suggested.  If  you take tub baths or showers, repack the wound with gauze following them. Sponge baths (at the sink) are a good alternative.  If an antibiotic was ordered to fight the infection, take as directed.  Only take over-the-counter or prescription medicines for pain, discomfort, or fever as directed by your caregiver.  After the drain is removed, use sitz baths for 20 minutes 4 times per day. Clean the wound gently with mild unscented soap, pat dry, and then apply a dry dressing. SEEK MEDICAL CARE IF:   You have increased pain, swelling, redness, drainage, or bleeding from the area.  You have a fever.  You have muscles aches, dizziness, or a general ill feeling. Document Released: 01/22/2000 Document Revised: 04/18/2011 Document Reviewed: 03/21/2008 [facility] Patient Information 2015 [place], [place]. This information is not intended to replace advice given to you by your health care provider. Make sure you discuss any questions you have with your health care provider.  Insomnia Insomnia is frequent trouble falling and/or staying asleep. Insomnia can be a long term problem or a short term problem. Both are common. Insomnia can be a short term problem when the wakefulness is related to a certain stress or worry. Long term insomnia is often related to ongoing stress during waking hours and/or poor sleeping habits. Overtime, sleep deprivation itself can make the problem worse. Every little thing feels more severe because you are overtired and your ability to cope is decreased. CAUSES  Stress, anxiety, and depression.  Poor sleeping habits.  Distractions such as TV in the bedroom.  Naps close to bedtime.  Engaging in emotionally charged conversations before bed.  Technical reading before sleep.  Alcohol and other sedatives. They may make the problem worse. They can hurt normal sleep patterns and normal dream activity.  Stimulants such as caffeine for several hours prior to bedtime.  Pain  syndromes and shortness of breath can cause insomnia.  Exercise late at night.  Changing time zones may cause sleeping problems (jet lag). It is sometimes helpful to have someone observe your sleeping patterns. They should look for periods of not breathing during the night (sleep apnea). They should also look to see how long those periods last. If you live alone or observers are uncertain, you can also be observed at a sleep clinic where your sleep patterns will be professionally monitored. Sleep apnea requires a checkup and treatment. Give your caregivers your medical history. Give your caregivers observations your family has made about your sleep.  SYMPTOMS   Not feeling rested in the morning.  Anxiety and restlessness at bedtime.  Difficulty falling and staying asleep. TREATMENT   Your caregiver may prescribe treatment for an underlying medical disorders. Your caregiver can give advice or help if you are using alcohol or other drugs for self-medication. Treatment of underlying problems will usually eliminate insomnia problems.  Medications can be prescribed for short time use. They are generally not recommended for lengthy use.  Over-the-counter sleep medicines are not recommended for lengthy use. They can be habit forming.  You can promote easier sleeping by making lifestyle changes such as:  Using relaxation techniques that help with breathing and reduce muscle tension.  Exercising earlier in the day.  Changing your diet and the time of your last meal. No night time snacks.  Establish a regular time to go to bed.  Counseling can help with stressful problems and worry.  Soothing music and white noise may be helpful if there are background noises you cannot remove.  Stop tedious detailed work at least one hour before bedtime. HOME CARE INSTRUCTIONS   Keep a diary. Inform your caregiver about your progress. This includes any medication side effects. See your caregiver  regularly. Take note of:  Times when you are asleep.  Times when you are awake during the night.  The quality of your sleep.  How you feel the next day. This information will help your caregiver care for you.  Get out of bed if you are still awake after 15 minutes. Read or do some quiet activity. Keep the lights down. Wait until you feel sleepy and go back to bed.  Keep regular sleeping and waking hours. Avoid naps.  Exercise regularly.  Avoid distractions at bedtime. Distractions include watching television or engaging in any intense or detailed activity like attempting to balance the household checkbook.  Develop a bedtime ritual. Keep a familiar routine of bathing, brushing your teeth, climbing into bed at the same time each night, listening to soothing music. Routines increase the success of falling to sleep faster.  Use relaxation techniques. This can be using breathing and muscle tension release routines. It can also include visualizing peaceful scenes. You can also help control troubling or intruding thoughts by keeping your mind occupied with boring or repetitive thoughts like the old concept of counting sheep. You can make it more creative like imagining planting one beautiful flower after another in your backyard garden.  During your day, work  to eliminate stress. When this is not possible use some of the previous suggestions to help reduce the anxiety that accompanies stressful situations. MAKE SURE YOU:   Understand these instructions.  Will watch your condition.  Will get help right away if you are not doing well or get worse. Document Released: 01/22/2000 Document Revised: 04/18/2011 Document Reviewed: 02/21/2007 [facility] Patient Information 2015 [place], [place]. This information is not intended to replace advice given to you by your health care provider. Make sure you discuss any questions you have with your health care provider.

## 2014-03-03 NOTE — Assessment & Plan Note (Signed)
A: desires to quit P: chantix

## 2014-03-03 NOTE — Progress Notes (Signed)
   Subjective:    Patient ID: Emily Saunders, female    DOB: 1975/06/14, 39 y.o.   MRN: [id number] CC: establish care, dental referral for broken teeth  HPI 38 yo F NP:   1. Broken teeth: x 3 months without trauma or pain. Patient with very few molars removed due to carries. Has trouble chewing food. Has foul breath.   2. Smoker: x 20 years. Able to quit when pregnant. Smokes 1/2-1 PPD, [place]. Has tried to quit and experienced aggressiveness a few years. No SI or depression. Desires to quit.   3. Poor sleep: x 2 months. Associated with sweating at night. No mood symptoms. No CP or SOB. Sleep 4 hrs at night off and on. Has to wake up at 6 AM.   Soc Hx; smokes 1/2 PPD Med Hx: vit D deficiency  Surg Hx: C-section, cyst excision  Review of Systems As per HPI  Having some insomnia for the past 2 months     Objective:   Physical Exam BP 128/84 mmHg  Pulse 81  Temp(Src) 98.3 F (36.8 C) (Oral)  Resp 18  Ht 5\' 6"  (1.676 m)  Wt 227 lb 3.2 oz (103.057 kg)  BMI 36.69 kg/m2  SpO2 98% General appearance: alert, cooperative and no distress Throat: normal findings: lips normal without lesions, buccal mucosa normal, tongue midline and normal, soft palate, uvula, and tonsils normal and oropharynx pink & moist without lesions or evidence of thrush and abnormal findings: dentition: multiple carries Lungs: normal WOB Skin: indurate area in superior gluteal cleft, no drainage or tenderness, or swelling.     Assessment & Plan:

## 2014-03-03 NOTE — Progress Notes (Signed)
Patient presents to establish care Requesting dental referral for 2 broken teeth C/o 2 month hx insomnia Smoking .5 ppd; would like to quit Would like to lose weight Taking prenatal vitamin for hx Vit D deficiency; no other meds

## 2014-03-03 NOTE — Assessment & Plan Note (Signed)
A; cyst w/o abscess P: cystectomy, office procedure

## 2014-03-03 NOTE — Assessment & Plan Note (Addendum)
Repeat vit D level  A: vit D insufficiency P: Vit D  + calcium supplement sent in

## 2014-03-03 NOTE — Assessment & Plan Note (Signed)
Referral to dietician

## 2014-03-03 NOTE — Assessment & Plan Note (Signed)
A: with poor sleep hygiene P: smoking cessation Sleep hygiene info

## 2014-03-04 ENCOUNTER — Telehealth: Payer: Self-pay | Admitting: *Deleted

## 2014-03-04 DIAGNOSIS — F17200 Nicotine dependence, unspecified, uncomplicated: Secondary | ICD-10-CM

## 2014-03-04 DIAGNOSIS — E559 Vitamin D deficiency, unspecified: Secondary | ICD-10-CM

## 2014-03-04 LAB — VITAMIN D 25 HYDROXY (VIT D DEFICIENCY, FRACTURES): Vit D, 25-Hydroxy: 28 ng/mL — ABNORMAL LOW (ref 30–100)

## 2014-03-04 MED ORDER — VARENICLINE TARTRATE 0.5 MG X 11 & 1 MG X 42 PO MISC: ORAL | Status: DC

## 2014-03-04 MED ORDER — CALCIUM CITRATE-VITAMIN D 500-400 MG-UNIT PO CHEW: 1.0000 | CHEWABLE_TABLET | Freq: Two times a day (BID) | ORAL | Status: DC

## 2014-03-04 MED ORDER — VARENICLINE TARTRATE 1 MG PO TABS: 1.0000 mg | ORAL_TABLET | Freq: Two times a day (BID) | ORAL | Status: DC

## 2014-03-04 NOTE — Telephone Encounter (Signed)
Pt aware of lab resutls

## 2014-03-04 NOTE — Addendum Note (Signed)
Addended by: Boykin Nearing on: 03/04/2014 11:58 AM   Modules accepted: Orders

## 2014-03-04 NOTE — Telephone Encounter (Signed)
-----   Message from Minerva Ends, MD sent at 03/04/2014 11:56 AM EST ----- Vit D insufficiency. Supplemental vit D sent in.  CMP and CBC normal.

## 2014-03-05 ENCOUNTER — Telehealth: Payer: Self-pay | Admitting: General Practice

## 2014-03-06 ENCOUNTER — Ambulatory Visit: Payer: Medicaid Other | Attending: Internal Medicine

## 2014-03-10 ENCOUNTER — Other Ambulatory Visit: Payer: Self-pay | Admitting: *Deleted

## 2014-03-10 DIAGNOSIS — F17200 Nicotine dependence, unspecified, uncomplicated: Secondary | ICD-10-CM

## 2014-03-10 MED ORDER — VARENICLINE TARTRATE 0.5 MG X 11 & 1 MG X 42 PO MISC: ORAL | Status: DC

## 2014-03-10 MED ORDER — VARENICLINE TARTRATE 1 MG PO TABS: 1.0000 mg | ORAL_TABLET | Freq: Two times a day (BID) | ORAL | Status: DC

## 2014-04-03 ENCOUNTER — Encounter: Payer: Medicaid Other | Attending: Family Medicine | Admitting: Dietician

## 2014-04-03 ENCOUNTER — Encounter: Payer: Self-pay | Admitting: Dietician

## 2014-04-03 VITALS — Ht 66.0 in | Wt 226.9 lb

## 2014-04-03 DIAGNOSIS — Z6836 Body mass index (BMI) 36.0-36.9, adult: Secondary | ICD-10-CM | POA: Insufficient documentation

## 2014-04-03 DIAGNOSIS — E669 Obesity, unspecified: Secondary | ICD-10-CM | POA: Diagnosis present

## 2014-04-03 DIAGNOSIS — Z713 Dietary counseling and surveillance: Secondary | ICD-10-CM | POA: Diagnosis not present

## 2014-04-03 NOTE — Progress Notes (Signed)
  Medical Nutrition Therapy:  Appt start time: 415 end time:  500   Assessment:  Primary concerns today: Jacquelina is here today to discuss a healthy diet and weight loss. She states that she feels like she is always hungry and may even get up in the middle of the night to have something sweet. She works in a [employer] (8am-2:30pm). She has 3 children, 2 of which live at home with her. She sometimes skips meals. Not sleeping through the night.   Preferred Learning Style:   No preference indicated   Learning Readiness:   Contemplating  MEDICATIONS: see list   DIETARY INTAKE:  Avoided foods include cooked carrots, squash, liver, pork chops, sweet potatoes, tomatoes.    24-hr recall:   Wakes up at 6am  B (8 AM): cereal or oatmeal (at home) muffin or breakfast pizza with juice (school bkfast)  L (9:30 AM): fish sandwich with fries (school lunch)  Snk (11 AM): peanut butter and jelly sandwich, grilled cheese sandwich, and soup  L (1:15 PM): grilled cheese D ( PM): baked chicken, rice, and green beans OR salad OR pizza S: Little Debbie cakes (often in the middle of the night)  Beverages: 18 oz juice per day, water, 24-68 oz soda per day, coffee  Usual physical activity: none  Estimated energy needs: 1600-1800 calories  Progress Towards Goal(s):  In progress.   Nutritional Diagnosis:  Lillie-3.3 Overweight/obesity As related to excessive energy intake, inappropriate food choices, physical inactivity.  As evidenced by BMI 36.7.    Intervention:  Nutrition counseling provided. Goals: -Avoid skipping meals (have something to eat every 3-5 hours) -Get peaches and other fruits in their own juice instead of heavy syrup -Cut back on sodas  -Drink carbonated water, cut juice in half (dilute with carbonated water)  -Work on getting 64 ounces of water a day (add cut up fruit) -Fill up on non starchy vegetables   -Add vegetables to anything you can -Have a protein food with each meal  and snack -Try Smart Balance butter -Try a piece of fruit when you are craving sweets (in the middle of the night) -Start walking the track near your house   -2 days a week  Teaching Method Utilized:  Visual Auditory  Handouts given during visit include:  MyPlate  Meal Planning Card  Barriers to learning/adherence to lifestyle change: chronic feelings of hunger  Demonstrated degree of understanding via:  Teach Back   Monitoring/Evaluation:  Dietary intake, exercise, and body weight in 2 month(s).

## 2014-04-03 NOTE — Patient Instructions (Addendum)
-  Avoid skipping meals (have something to eat every 3-5 hours) -Get peaches and other fruits in their own juice instead of heavy syrup -Cut back on sodas  -Drink carbonated water, cut juice in half (dilute with carbonated water)  -Work on getting 64 ounces of water a day (add cut up fruit) -Fill up on non starchy vegetables   -Add vegetables to anything you can -Have a protein food with each meal and snack -Try Smart Balance butter -Try a piece of fruit when you are craving sweets (in the middle of the night) -Start walking the track near your house   -2 days a week

## 2014-04-07 ENCOUNTER — Ambulatory Visit: Payer: Medicaid Other | Attending: Family Medicine | Admitting: Family Medicine

## 2014-04-07 ENCOUNTER — Other Ambulatory Visit (HOSPITAL_COMMUNITY)
Admission: RE | Admit: 2014-04-07 | Discharge: 2014-04-07 | Disposition: A | Discharge status: Home / Self Care | Payer: Medicaid Other | Source: Ambulatory Visit | Attending: Family Medicine | Admitting: Family Medicine

## 2014-04-07 VITALS — BP 123/87 | HR 79 | Temp 98.8°F | Resp 16 | Wt 227.0 lb

## 2014-04-07 DIAGNOSIS — Z113 Encounter for screening for infections with a predominantly sexual mode of transmission: Secondary | ICD-10-CM | POA: Insufficient documentation

## 2014-04-07 DIAGNOSIS — Z01419 Encounter for gynecological examination (general) (routine) without abnormal findings: Secondary | ICD-10-CM | POA: Diagnosis present

## 2014-04-07 DIAGNOSIS — Z1151 Encounter for screening for human papillomavirus (HPV): Secondary | ICD-10-CM | POA: Insufficient documentation

## 2014-04-07 DIAGNOSIS — N760 Acute vaginitis: Secondary | ICD-10-CM | POA: Diagnosis present

## 2014-04-07 DIAGNOSIS — N979 Female infertility, unspecified: Secondary | ICD-10-CM

## 2014-04-07 DIAGNOSIS — R87810 Cervical high risk human papillomavirus (HPV) DNA test positive: Secondary | ICD-10-CM | POA: Insufficient documentation

## 2014-04-07 DIAGNOSIS — Z114 Encounter for screening for human immunodeficiency virus [HIV]: Secondary | ICD-10-CM

## 2014-04-07 DIAGNOSIS — Z124 Encounter for screening for malignant neoplasm of cervix: Secondary | ICD-10-CM

## 2014-04-07 LAB — POCT URINE PREGNANCY: Preg Test, Ur: NEGATIVE

## 2014-04-07 NOTE — Patient Instructions (Addendum)
Mrs. Nickolas Madrid,  Very nice to see you again.  1. Pap smear done today.  2. Trouble conceiving:  TSH, FSH, LH -hormone levels today Plan for pelvic US if labs normal May need referral to gynecology.   Weight loss to closer to ideal body weight does help with fertility Continue to work on quitting smoking. Continue regular intercourse.   You will be called with pap and lab results   F/u in 3 months

## 2014-04-07 NOTE — Assessment & Plan Note (Signed)
Screening HIV

## 2014-04-07 NOTE — Progress Notes (Signed)
Patient here to discuss not being able to conceive. Patient states she has not continued Chantix after completing the starter pak. Patient continues on all other medications

## 2014-04-07 NOTE — Progress Notes (Signed)
   Subjective:    Patient ID: Emily Saunders, female    DOB: 1975/10/22, 39 y.o.   MRN: [id number] CC: trouble conceiving x 1 yr since last miscarrige,  HPI 39 yo F f/u:  1. Unable to conceive: patient has been unable to conceive since miscarriage on 02/14/13. She bled for 5 days post miscarriage. Did not require D&C. Has a period monthly. Has sex 3-4 times per week with one partner. Has a 24 yo child with the same partner but desire one more child before age 79. Periods are not painful or heavy. Last 5 days usually. Menarche at age 40.   OB History  Gravida Para Term Preterm AB SAB TAB Ectopic Multiple Living  5 3 3  2 1  1  3     # Outcome Date GA Lbr Len/2nd Weight Sex Delivery Anes PTL Lv  5 SAB 02/14/13        N     Comments: System Generated. Please review and update pregnancy details.  4 Ectopic 2005        N  3 Term 04/04/01 [id number]  7 lb (3.175 kg) M Vag-Spont EPI  Y  2 Term 01/15/97 [redacted]w[redacted]d  7 lb (3.175 kg) F Vag-Spont EPI  Y  1 Term 04/11/93 [redacted]w[redacted]d  7 lb (3.175 kg) F CS-LTranv EPI  Y      Soc Hx: current smoker  Review of Systems As per HPI     Objective:   Physical Exam BP 123/87 mmHg  Pulse 79  Temp(Src) 98.8 F (37.1 C)  Resp 16  Wt 227 lb (102.967 kg)  SpO2 98%  LMP 04/01/2014 (Exact Date)  Wt Readings from Last 3 Encounters:  04/07/14 227 lb (102.967 kg)  04/03/14 226 lb 14.4 oz (102.921 kg)  03/03/14 227 lb 3.2 oz (103.057 kg)  General appearance: alert, cooperative, no distress and moderately obese Lungs: clear to auscultation bilaterally Heart: regular rate and rhythm, S1, S2 normal, no murmur, click, rub or gallop Pelvic: cervix normal in appearance, external genitalia normal, no adnexal masses or tenderness, no cervical motion tenderness, positive findings: tag (dog ear) of vagial tissue on the L vaginal wall , rectovaginal septum normal, uterus normal size, shape, and consistency and vagina normal without discharge       Assessment & Plan:

## 2014-04-07 NOTE — Assessment & Plan Note (Addendum)
Pap smear done today.  HPV positive pap with normal cytology. Repeat pap in one year.

## 2014-04-07 NOTE — Assessment & Plan Note (Signed)
Trouble conceiving:  TSH, FSH, LH -hormone levels today Plan for pelvic US if labs normal May need referral to gynecology.   Weight loss to closer to ideal body weight does help with fertility Continue to work on quitting smoking. Continue regular intercourse.

## 2014-04-08 ENCOUNTER — Telehealth: Payer: Self-pay | Admitting: *Deleted

## 2014-04-08 LAB — HIV ANTIBODY (ROUTINE TESTING W REFLEX): HIV: NONREACTIVE

## 2014-04-08 LAB — FSH/LH
FSH: 6.2 m[IU]/mL
LH: 2.4 m[IU]/mL

## 2014-04-08 LAB — TSH: TSH: 1.758 u[IU]/mL (ref 0.350–4.500)

## 2014-04-08 NOTE — Telephone Encounter (Signed)
Left voice message with normal results If any question return call

## 2014-04-08 NOTE — Telephone Encounter (Signed)
-----   Message from Minerva Ends, MD sent at 04/08/2014  8:45 AM EST ----- Negative screening HIV Normal hormone levels

## 2014-04-09 ENCOUNTER — Telehealth: Payer: Self-pay | Admitting: *Deleted

## 2014-04-09 DIAGNOSIS — N979 Female infertility, unspecified: Secondary | ICD-10-CM

## 2014-04-09 LAB — CERVICOVAGINAL ANCILLARY ONLY
Chlamydia: NEGATIVE
Neisseria Gonorrhea: NEGATIVE
Wet Prep (BD Affirm): NEGATIVE
Wet Prep (BD Affirm): NEGATIVE
Wet Prep (BD Affirm): NEGATIVE

## 2014-04-09 LAB — CYTOLOGY - PAP

## 2014-04-09 NOTE — Telephone Encounter (Signed)
Pt requesting a US pelvic

## 2014-04-10 ENCOUNTER — Telehealth: Payer: Self-pay | Admitting: *Deleted

## 2014-04-10 NOTE — Telephone Encounter (Signed)
Ultrasounds ordered. Please call patient and schedule

## 2014-04-10 NOTE — Addendum Note (Signed)
Addended by: Boykin Nearing on: 04/10/2014 08:57 AM   Modules accepted: Orders

## 2014-04-10 NOTE — Telephone Encounter (Signed)
Left voice message with [place] appointment  Information Appointment 04/17/2014 at 01:00 arriving 15 min early  Full bladder

## 2014-04-15 ENCOUNTER — Telehealth: Payer: Self-pay | Admitting: Family Medicine

## 2014-04-15 ENCOUNTER — Telehealth: Payer: Self-pay | Admitting: *Deleted

## 2014-04-15 NOTE — Telephone Encounter (Signed)
Pt aware of pap smear results

## 2014-04-15 NOTE — Telephone Encounter (Signed)
Left voice message to return call 

## 2014-04-15 NOTE — Telephone Encounter (Signed)
-----   Message from Minerva Ends, MD sent at 04/10/2014  9:24 PM EST ----- HPV positive pap with normal cytology. Repeat pap in one year

## 2014-04-15 NOTE — Telephone Encounter (Signed)
Patient called to speak to nurse about pap smear results. Please f/u with pt.

## 2014-04-15 NOTE — Telephone Encounter (Signed)
-----   Message from Minerva Ends, MD sent at 04/10/2014  8:40 AM EST ----- Neg Gc/chlam

## 2014-04-17 ENCOUNTER — Ambulatory Visit (HOSPITAL_COMMUNITY)
Admission: RE | Admit: 2014-04-17 | Discharge: 2014-04-17 | Disposition: A | Discharge status: Home / Self Care | Payer: Medicaid Other | Source: Ambulatory Visit | Attending: Family Medicine | Admitting: Family Medicine

## 2014-04-17 DIAGNOSIS — N979 Female infertility, unspecified: Secondary | ICD-10-CM | POA: Insufficient documentation

## 2014-04-18 ENCOUNTER — Telehealth: Payer: Self-pay | Admitting: Family Medicine

## 2014-04-18 NOTE — Telephone Encounter (Signed)
Patient is calling to get results for her ultrasounds, please f/u with pt.

## 2014-04-24 NOTE — Telephone Encounter (Signed)
Pt aware with [place] reslts   Notes Recorded by Lorayne Marek, MD on 04/17/2014 at 5:20 PM Call and let the patient know that ultrasound pelvis does not report any acute pathology. Follow up with her PCP Dr. Adrian Blackwater, she will be back in office next week.

## 2014-06-02 ENCOUNTER — Ambulatory Visit: Payer: Medicaid Other | Admitting: Dietician

## 2014-06-23 ENCOUNTER — Ambulatory Visit: Payer: Self-pay | Attending: Family Medicine | Admitting: Family Medicine

## 2014-06-23 ENCOUNTER — Encounter: Payer: Self-pay | Admitting: Family Medicine

## 2014-06-23 VITALS — BP 130/91 | HR 74 | Temp 98.4°F | Resp 16 | Ht 66.0 in | Wt 225.0 lb

## 2014-06-23 DIAGNOSIS — Z720 Tobacco use: Secondary | ICD-10-CM

## 2014-06-23 DIAGNOSIS — IMO0001 Reserved for inherently not codable concepts without codable children: Secondary | ICD-10-CM | POA: Insufficient documentation

## 2014-06-23 DIAGNOSIS — R030 Elevated blood-pressure reading, without diagnosis of hypertension: Secondary | ICD-10-CM | POA: Insufficient documentation

## 2014-06-23 DIAGNOSIS — N979 Female infertility, unspecified: Secondary | ICD-10-CM | POA: Insufficient documentation

## 2014-06-23 DIAGNOSIS — F17200 Nicotine dependence, unspecified, uncomplicated: Secondary | ICD-10-CM | POA: Insufficient documentation

## 2014-06-23 DIAGNOSIS — J309 Allergic rhinitis, unspecified: Secondary | ICD-10-CM | POA: Insufficient documentation

## 2014-06-23 MED ORDER — CETIRIZINE HCL 10 MG PO TABS: 10.0000 mg | ORAL_TABLET | Freq: Every day | ORAL | Status: DC

## 2014-06-23 NOTE — Progress Notes (Signed)
   Subjective:    Patient ID: Emily Saunders, female    DOB: 05-04-1975, 39 y.o.   MRN: [id number] CC:  f/u infertility  HPI  1. infertility:  LMP 06/12/14 - 3-4 days each time  05/20/14 04/25/14   2. Red spot on eye: no trauma. Patient sneezing heavily. Went away after one week.  3. Elevated BP; no hx of HTN. Mild L sided HA comes and goes. No vision changes.   Soc Hx: current smoker   Review of Systems  Eyes: Negative for visual disturbance.  Neurological: Positive for headaches.       Objective:   Physical Exam BP 136/95 mmHg  Pulse 76  Temp(Src) 98.4 F (36.9 C) (Oral)  Resp 16  Ht 5\' 6"  (1.676 m)  Wt 225 lb (102.059 kg)  BMI 36.33 kg/m2  SpO2 96%  LMP 06/12/2014  BP Readings from Last 3 Encounters:  06/23/14 136/95  04/07/14 123/87  03/03/14 128/84   Wt Readings from Last 3 Encounters:  06/23/14 225 lb (102.059 kg)  04/07/14 227 lb (102.967 kg)  04/03/14 226 lb 14.4 oz (102.921 kg)   General appearance: alert, cooperative and no distress Eyes: conjunctivae/corneas clear. PERRL, EOM's intact.  Nose: pink and swollen turbinates, with mucus in R nares  Lungs: normal WOB        Assessment & Plan:

## 2014-06-23 NOTE — Assessment & Plan Note (Addendum)
Elevated BP: Treat allergic rhinitis Avoid smoking  DASH diet

## 2014-06-23 NOTE — Assessment & Plan Note (Signed)
Smoking:  Smoking cessation support: smoking cessation hotline: 1-800-QUIT-NOW.  Smoking cessation classes are available through [facility]. Call [phone] or visit our website at [email or web address]. 

## 2014-06-23 NOTE — Patient Instructions (Addendum)
Emily Saunders,  Thank you for coming in today  1. Red spot on eye was subconjunctival hemorrhage. Resolved. Will treat with zyrtec to prevent coughing and sneezing   2. Allergic rhinitis: zyrtec  3. Elevated BP: Treat allergic rhinitis Avoid smoking  DASH diet  4. Infertility: gyn referral. Normal evaluation so far.   5. Smoking: Smoking cessation support: smoking cessation hotline: 1-800-QUIT-NOW.  Smoking cessation classes are available through [facility]. Call [phone] or visit our website at [email or web address]     F/u in 2 weeks with RN for BP check  F/u with me in 3 months for fertility   Dr. Adrian Blackwater   DASH Eating Plan DASH stands for "Dietary Approaches to Stop Hypertension." The DASH eating plan is a healthy eating plan that has been shown to reduce high blood pressure (hypertension). Additional health benefits may include reducing the risk of type 2 diabetes mellitus, heart disease, and stroke. The DASH eating plan may also help with weight loss. WHAT DO I NEED TO KNOW ABOUT THE DASH EATING PLAN? For the DASH eating plan, you will follow these general guidelines:  Choose foods with a percent daily value for sodium of less than 5% (as listed on the food label).  Use salt-free seasonings or herbs instead of table salt or sea salt.  Check with your health care provider or pharmacist before using salt substitutes.  Eat lower-sodium products, often labeled as "lower sodium" or "no salt added."  Eat fresh foods.  Eat more vegetables, fruits, and low-fat dairy products.  Choose whole grains. Look for the word "whole" as the first word in the ingredient list.  Choose fish and skinless chicken or [place] more often than red meat. Limit fish, poultry, and meat to 6 oz (170 g) each day.  Limit sweets, desserts, sugars, and sugary drinks.  Choose heart-healthy fats.  Limit cheese to 1 oz (28 g) per day.  Eat more home-cooked food and less  restaurant, buffet, and fast food.  Limit fried foods.  Cook foods using methods other than frying.  Limit canned vegetables. If you do use them, rinse them well to decrease the sodium.  When eating at a restaurant, ask that your food be prepared with less salt, or no salt if possible. WHAT FOODS CAN I EAT? Seek help from a dietitian for individual calorie needs. Grains Whole grain or whole wheat bread. Brown rice. Whole grain or whole wheat pasta. Quinoa, bulgur, and whole grain cereals. Low-sodium cereals. Corn or whole wheat flour tortillas. Whole grain cornbread. Whole grain crackers. Low-sodium crackers. Vegetables Fresh or frozen vegetables (raw, steamed, roasted, or grilled). Low-sodium or reduced-sodium tomato and vegetable juices. Low-sodium or reduced-sodium tomato sauce and paste. Low-sodium or reduced-sodium canned vegetables.  Fruits All fresh, canned (in natural juice), or frozen fruits. Meat and Other Protein Products Ground beef (85% or leaner), grass-fed beef, or beef trimmed of fat. Skinless chicken or [place]. Ground chicken or [place]. Pork trimmed of fat. All fish and seafood. Eggs. Dried beans, peas, or lentils. Unsalted nuts and seeds. Unsalted canned beans. Dairy Low-fat dairy products, such as skim or 1% milk, 2% or reduced-fat cheeses, low-fat ricotta or cottage cheese, or plain low-fat yogurt. Low-sodium or reduced-sodium cheeses. Fats and Oils Tub margarines without trans fats. Light or reduced-fat mayonnaise and salad dressings (reduced sodium). Avocado. Safflower, olive, or canola oils. Natural peanut or almond butter. Other Unsalted popcorn and pretzels. The items listed above may not be a complete list of recommended  foods or beverages. Contact your dietitian for more options. WHAT FOODS ARE NOT RECOMMENDED? Grains White bread. White pasta. White rice. Refined cornbread. Bagels and croissants. Crackers that contain trans fat. Vegetables Creamed or fried  vegetables. Vegetables in a cheese sauce. Regular canned vegetables. Regular canned tomato sauce and paste. Regular tomato and vegetable juices. Fruits Dried fruits. Canned fruit in light or heavy syrup. Fruit juice. Meat and Other Protein Products Fatty cuts of meat. Ribs, chicken wings, bacon, sausage, bologna, salami, chitterlings, fatback, hot dogs, bratwurst, and packaged luncheon meats. Salted nuts and seeds. Canned beans with salt. Dairy Whole or 2% milk, cream, half-and-half, and cream cheese. Whole-fat or sweetened yogurt. Full-fat cheeses or blue cheese. Nondairy creamers and whipped toppings. Processed cheese, cheese spreads, or cheese curds. Condiments Onion and garlic salt, seasoned salt, table salt, and sea salt. Canned and packaged gravies. Worcestershire sauce. Tartar sauce. Barbecue sauce. Teriyaki sauce. Soy sauce, including reduced sodium. Steak sauce. Fish sauce. Oyster sauce. Cocktail sauce. Horseradish. Ketchup and mustard. Meat flavorings and tenderizers. Bouillon cubes. Hot sauce. Tabasco sauce. Marinades. Taco seasonings. Relishes. Fats and Oils Butter, stick margarine, lard, shortening, ghee, and bacon fat. Coconut, palm kernel, or palm oils. Regular salad dressings. Other Pickles and olives. Salted popcorn and pretzels. The items listed above may not be a complete list of foods and beverages to avoid. Contact your dietitian for more information. WHERE CAN I FIND MORE INFORMATION? National [employer], [employer], and [employer] Institute: travelstabloid.com Document Released: 01/13/2011 Document Revised: 06/10/2013 Document Reviewed: 11/28/2012 [facility] Patient Information 2015 [place], [place]. This information is not intended to replace advice given to you by your health care provider. Make sure you discuss any questions you have with your health care provider.

## 2014-06-23 NOTE — Assessment & Plan Note (Signed)
A: HA, sinus pressure, sneezing consistent with allergic rhinitis P; Zyrtec daily

## 2014-06-23 NOTE — Progress Notes (Signed)
F/U infertility

## 2014-06-23 NOTE — Assessment & Plan Note (Addendum)
Infertility: gyn referral. Normal evaluation so far.

## 2014-06-24 ENCOUNTER — Telehealth: Payer: Self-pay | Admitting: Family Medicine

## 2014-06-24 NOTE — Telephone Encounter (Signed)
Emily Saunders,   Please pass on this info to [name],    Unfortunately, Medicaid does not cover infertility evaluation and management and we are unable to provide such services at the clinic.   She can be referred to Dr. [name]; she should know she will have to pay out-of-pocket fees to see him or any other REI specialist.   In the meantime, she can use ovulation predictor kits which are OTC, can help her know if/when she is ovulating.

## 2014-06-26 NOTE — Telephone Encounter (Signed)
Pt aware of information.  

## 2014-08-18 ENCOUNTER — Ambulatory Visit: Payer: Medicaid Other

## 2014-09-11 ENCOUNTER — Ambulatory Visit: Payer: Self-pay | Admitting: Obstetrics

## 2014-09-11 ENCOUNTER — Ambulatory Visit: Payer: Medicaid Other | Admitting: Obstetrics

## 2014-09-17 ENCOUNTER — Ambulatory Visit: Payer: Medicaid Other

## 2014-09-23 ENCOUNTER — Encounter: Payer: Self-pay | Admitting: Obstetrics

## 2014-09-23 ENCOUNTER — Ambulatory Visit (INDEPENDENT_AMBULATORY_CARE_PROVIDER_SITE_OTHER): Payer: Medicaid Other | Admitting: Obstetrics

## 2014-09-23 VITALS — BP 137/92 | HR 78 | Temp 98.5°F | Ht 66.0 in | Wt 209.2 lb

## 2014-09-23 DIAGNOSIS — N970 Female infertility associated with anovulation: Secondary | ICD-10-CM

## 2014-09-23 DIAGNOSIS — F17200 Nicotine dependence, unspecified, uncomplicated: Secondary | ICD-10-CM | POA: Diagnosis not present

## 2014-09-23 DIAGNOSIS — Z3169 Encounter for other general counseling and advice on procreation: Secondary | ICD-10-CM | POA: Diagnosis not present

## 2014-09-23 MED ORDER — VARENICLINE TARTRATE 0.5 MG X 11 & 1 MG X 42 PO MISC: ORAL | Status: DC

## 2014-09-23 MED ORDER — PRENATAL 27-0.8 MG PO TABS
1.0000 | ORAL_TABLET | Freq: Every day | ORAL | Status: DC
Start: 2014-09-23 — End: 2014-12-24

## 2014-09-23 NOTE — Progress Notes (Signed)
Patient ID: Emily Saunders, female   DOB: [birth year], 39 y.o.   MRN: [id number]  Chief Complaint  Patient presents with  . Personal Problem    HPI Emily Saunders is a 39 y.o. female.  Trying to get pregnant for over a year without success.  Had a miscarriage over a year ago.  Has tobacco dependence and would like assistance in quitting smoking. HPI  Past Medical History  Diagnosis Date  . Vitamin D deficiency 2015   . H/O pilonidal cyst 2002   . PID (acute pelvic inflammatory disease) 2006     Past Surgical History  Procedure Laterality Date  . No past surgeries    . Cesarean section    . Cyst excision Right 2003    hand    Family History  Problem Relation Age of Onset  . Hypertension Mother   . Diabetes Mother   . Asthma Mother   . COPD Mother   . Depression Mother   . Miscarriages / [place] Mother   . Vision loss Mother   . Parkinson's disease Father   . Diabetes Maternal Aunt   . Diabetes Maternal Uncle   . Diabetes Cousin     Social History Social History  Substance Use Topics  . Smoking status: Current Some Day Smoker -- 0.50 packs/day    Types: Cigarettes  . Smokeless tobacco: Never Used     Comment: Smoking .5 ppd  . Alcohol Use: No    No Known Allergies  Current Outpatient Prescriptions  Medication Sig Dispense Refill  . calcium citrate-vitamin D 500-400 MG-UNIT chewable tablet Chew 1 tablet by mouth 2 (two) times daily. (Patient not taking: Reported on 09/23/2014) 180 tablet 1  . cetirizine (ZYRTEC) 10 MG tablet Take 1 tablet (10 mg total) by mouth daily. (Patient not taking: Reported on 09/23/2014) 30 tablet 11  . Prenatal Vit-Fe Fumarate-FA (MULTIVITAMIN-PRENATAL) 27-0.8 MG TABS tablet Take 1 tablet by mouth daily at 12 noon. 30 each 11  . varenicline (CHANTIX STARTING MONTH PAK) 0.5 MG X 11 & 1 MG X 42 tablet Take one 0.5 mg tablet by mouth once daily for 3 days, then increase to one 0.5 mg tablet twice daily for 4 days, then increase to one 1  mg tablet twice daily. 53 tablet 2   No current facility-administered medications for this visit.    Review of Systems Review of Systems Constitutional: negative for fatigue and weight loss Respiratory: negative for cough and wheezing Cardiovascular: negative for chest pain, fatigue and palpitations Gastrointestinal: negative for abdominal pain and change in bowel habits Genitourinary:negative Integument/breast: negative for nipple discharge Musculoskeletal:negative for myalgias Neurological: negative for gait problems and tremors Behavioral/Psych: negative for abusive relationship, depression Endocrine: negative for temperature intolerance     Blood pressure 137/92, pulse 78, temperature 98.5 F (36.9 C), height 5\' 6"  (1.676 m), weight 209 lb 3.2 oz (94.892 kg), last menstrual period 09/23/2014.  Physical Exam Physical Exam General:   alert  Skin:   no rash or abnormalities  Lungs:   clear to auscultation bilaterally  Heart:   regular rate and rhythm, S1, S2 normal, no murmur, click, rub or gallop  Breasts:   normal without suspicious masses, skin or nipple changes or axillary nodes  Abdomen:  normal findings: no organomegaly, soft, non-tender and no hernia  Pelvis:  External genitalia: normal general appearance Urinary system: urethral meatus normal and bladder without fullness, nontender Vaginal: normal without tenderness, induration or masses Cervix: normal appearance Adnexa: normal bimanual  exam Uterus: anteverted and non-tender, normal size    100% of 10 min visit spent on counseling and coordination of care.   Data Reviewed none  Assessment     Oligo ovulation Tobacco dependence Preconception counseling     Plan    Day 21 Progesterone ordered Chantix Rx for tobacco dependence PNV's Rx for neuroprophylaxis  Orders Placed This Encounter  Procedures  . Progesterone    Standing Status: Future     Number of Occurrences:      Standing Expiration Date:  09/23/2015  . Comprehensive metabolic panel  . TSH  . CBC  . Vit D  25 hydroxy (rtn osteoporosis monitoring)   Meds ordered this encounter  Medications  . Prenatal Vit-Fe Fumarate-FA (MULTIVITAMIN-PRENATAL) 27-0.8 MG TABS tablet    Sig: Take 1 tablet by mouth daily at 12 noon.    Dispense:  30 each    Refill:  11  . varenicline (CHANTIX STARTING MONTH PAK) 0.5 MG X 11 & 1 MG X 42 tablet    Sig: Take one 0.5 mg tablet by mouth once daily for 3 days, then increase to one 0.5 mg tablet twice daily for 4 days, then increase to one 1 mg tablet twice daily.    Dispense:  53 tablet    Refill:  2

## 2014-09-24 ENCOUNTER — Ambulatory Visit: Payer: Medicaid Other

## 2014-09-24 LAB — COMPREHENSIVE METABOLIC PANEL
ALBUMIN: 4.6 g/dL (ref 3.6–5.1)
ALK PHOS: 30 U/L — AB (ref 33–115)
ALT: 12 U/L (ref 6–29)
AST: 14 U/L (ref 10–30)
BUN: 10 mg/dL (ref 7–25)
CALCIUM: 9.7 mg/dL (ref 8.6–10.2)
CO2: 25 mmol/L (ref 20–31)
Chloride: 109 mmol/L (ref 98–110)
Creat: 0.96 mg/dL (ref 0.50–1.10)
GLUCOSE: 75 mg/dL (ref 65–99)
Potassium: 4 mmol/L (ref 3.5–5.3)
Sodium: 144 mmol/L (ref 135–146)
Total Bilirubin: 0.4 mg/dL (ref 0.2–1.2)
Total Protein: 7.2 g/dL (ref 6.1–8.1)

## 2014-09-24 LAB — CBC
HCT: 39.1 % (ref 36.0–46.0)
Hemoglobin: 12.8 g/dL (ref 12.0–15.0)
MCH: 29.4 pg (ref 26.0–34.0)
MCHC: 32.7 g/dL (ref 30.0–36.0)
MCV: 89.7 fL (ref 78.0–100.0)
MPV: 11 fL (ref 8.6–12.4)
PLATELETS: 180 10*3/uL (ref 150–400)
RBC: 4.36 MIL/uL (ref 3.87–5.11)
RDW: 15.1 % (ref 11.5–15.5)
WBC: 5.5 10*3/uL (ref 4.0–10.5)

## 2014-09-24 LAB — TSH: TSH: 0.831 u[IU]/mL (ref 0.350–4.500)

## 2014-09-24 LAB — VITAMIN D 25 HYDROXY (VIT D DEFICIENCY, FRACTURES): Vit D, 25-Hydroxy: 30 ng/mL (ref 30–100)

## 2014-09-25 ENCOUNTER — Other Ambulatory Visit: Payer: Self-pay | Admitting: *Deleted

## 2014-09-25 MED ORDER — OB COMPLETE PETITE 35-5-1-200 MG PO CAPS: 1.0000 | ORAL_CAPSULE | Freq: Every day | ORAL | Status: DC

## 2014-10-14 ENCOUNTER — Other Ambulatory Visit: Payer: Medicaid Other

## 2014-10-14 DIAGNOSIS — N970 Female infertility associated with anovulation: Secondary | ICD-10-CM

## 2014-10-15 LAB — PROGESTERONE: Progesterone: 8.8 ng/mL

## 2014-10-16 ENCOUNTER — Telehealth: Payer: Self-pay | Admitting: *Deleted

## 2014-10-16 NOTE — Telephone Encounter (Signed)
Patient is calling for her lab results.  

## 2014-10-19 ENCOUNTER — Encounter (HOSPITAL_COMMUNITY): Payer: Self-pay | Admitting: Emergency Medicine

## 2014-10-19 ENCOUNTER — Emergency Department (HOSPITAL_COMMUNITY)
Admission: EM | Admit: 2014-10-19 | Discharge: 2014-10-19 | Disposition: A | Discharge status: Home / Self Care | Payer: Medicaid Other | Attending: Emergency Medicine | Admitting: Emergency Medicine

## 2014-10-19 DIAGNOSIS — Z79899 Other long term (current) drug therapy: Secondary | ICD-10-CM | POA: Diagnosis not present

## 2014-10-19 DIAGNOSIS — L0501 Pilonidal cyst with abscess: Secondary | ICD-10-CM | POA: Diagnosis not present

## 2014-10-19 DIAGNOSIS — Z8742 Personal history of other diseases of the female genital tract: Secondary | ICD-10-CM | POA: Diagnosis not present

## 2014-10-19 DIAGNOSIS — Z720 Tobacco use: Secondary | ICD-10-CM | POA: Diagnosis not present

## 2014-10-19 DIAGNOSIS — E559 Vitamin D deficiency, unspecified: Secondary | ICD-10-CM | POA: Insufficient documentation

## 2014-10-19 MED ORDER — LIDOCAINE-EPINEPHRINE (PF) 2 %-1:200000 IJ SOLN
INTRAMUSCULAR | Status: AC
  Administered 2014-10-19: 10 mL
  Filled 2014-10-19: qty 20

## 2014-10-19 MED ORDER — LIDOCAINE-EPINEPHRINE (PF) 2 %-1:200000 IJ SOLN
10.0000 mL | Freq: Once | INTRAMUSCULAR | Status: AC
  Administered 2014-10-19: 10 mL

## 2014-10-19 MED ORDER — HYDROCODONE-ACETAMINOPHEN 5-325 MG PO TABS: 1.0000 | ORAL_TABLET | Freq: Four times a day (QID) | ORAL | Status: DC | PRN

## 2014-10-19 NOTE — ED Provider Notes (Signed)
CSN: [id number]     Arrival date & time 10/19/14  2028 History  This chart was scribed for non-physician provider Antonietta Breach, PA-C, working with Lacretia Leigh, MD by Irene Pap, ED Scribe. This patient was seen in room WTR5/WTR5 and patient care was started at 9:15 PM.   Chief Complaint  Patient presents with  . Abscess    "tailbone"   The history is provided by the patient. No language interpreter was used.   HPI Comments: Emily Saunders is a 39 y.o. female with hx of pilonidal cyst x4 who presents to the Emergency Department complaining of a stabbing, constant, painful bump on her "tailbone" onset two days ago. Pt states that she has a prior hx of abscesses in the same area, but is not sure how long ago that was She states that she sat in a warm bath and took motrin for pain to no relief. Pt denies seeing general surgery for this recurrent problem, fevers, or drainage from the area.    Past Medical History  Diagnosis Date  . Vitamin D deficiency 2015   . H/O pilonidal cyst 2002   . PID (acute pelvic inflammatory disease) 2006    Past Surgical History  Procedure Laterality Date  . No past surgeries    . Cesarean section    . Cyst excision Right 2003    hand   Family History  Problem Relation Age of Onset  . Hypertension Mother   . Diabetes Mother   . Asthma Mother   . COPD Mother   . Depression Mother   . Miscarriages / [place] Mother   . Vision loss Mother   . Parkinson's disease Father   . Diabetes Maternal Aunt   . Diabetes Maternal Uncle   . Diabetes Cousin    Social History  Substance Use Topics  . Smoking status: Current Some Day Smoker -- 0.75 packs/day    Types: Cigarettes  . Smokeless tobacco: Never Used     Comment: Smoking .5 ppd  . Alcohol Use: No   OB History    Gravida Para Term Preterm AB TAB SAB Ectopic Multiple Living   5 3 3  2  1 1  3       Review of Systems  Constitutional: Negative for fever and chills.  Skin: Positive for wound.  Negative for rash.  All other systems reviewed and are negative.   Allergies  Review of patient's allergies indicates no known allergies.  Home Medications   Prior to Admission medications   Medication Sig Start Date End Date Taking? Authorizing Provider  Prenatal Vit-Fe Fumarate-FA (MULTIVITAMIN-PRENATAL) 27-0.8 MG TABS tablet Take 1 tablet by mouth daily at 12 noon. 09/23/14  Yes Shelly Bombard, MD  calcium citrate-vitamin D 500-400 MG-UNIT chewable tablet Chew 1 tablet by mouth 2 (two) times daily. Patient not taking: Reported on 09/23/2014 03/04/14   Boykin Nearing, MD  cetirizine (ZYRTEC) 10 MG tablet Take 1 tablet (10 mg total) by mouth daily. Patient not taking: Reported on 09/23/2014 06/23/14   Boykin Nearing, MD  HYDROcodone-acetaminophen (NORCO/VICODIN) 5-325 MG per tablet Take 1-2 tablets by mouth every 6 (six) hours as needed for severe pain. 10/19/14   Antonietta Breach, PA-C  Prenat-FeCbn-FeAspGl-FA-Omega (OB COMPLETE PETITE) 35-5-1-200 MG CAPS Take 1 capsule by mouth daily. Patient not taking: Reported on 10/19/2014 09/25/14   Shelly Bombard, MD  varenicline (CHANTIX STARTING MONTH PAK) 0.5 MG X 11 & 1 MG X 42 tablet Take one 0.5 mg tablet by  mouth once daily for 3 days, then increase to one 0.5 mg tablet twice daily for 4 days, then increase to one 1 mg tablet twice daily. Patient not taking: Reported on 10/19/2014 09/23/14   Shelly Bombard, MD   BP 143/89 mmHg  Pulse 75  Temp(Src) 98 F (36.7 C) (Oral)  Resp 19  Ht 5\' 6"  (1.676 m)  Wt 204 lb (92.534 kg)  BMI 32.94 kg/m2  SpO2 100%  LMP 09/19/2014 (Exact Date)  Physical Exam  Constitutional: She is oriented to person, place, and time. She appears well-developed and well-nourished. No distress.  Nontoxic/nonseptic appearing  HENT:  Head: Normocephalic and atraumatic.  Eyes: Conjunctivae and EOM are normal. No scleral icterus.  Neck: Normal range of motion.  Pulmonary/Chest: Effort normal. No respiratory distress.   Respirations even and unlabored  Genitourinary:     Musculoskeletal: Normal range of motion.  Neurological: She is alert and oriented to person, place, and time. She exhibits normal muscle tone. Coordination normal.  Patient ambulatory with steady gait  Skin: Skin is warm and dry. No rash noted. She is not diaphoretic. No erythema. No pallor.  Psychiatric: She has a normal mood and affect. Her behavior is normal.  Nursing note and vitals reviewed.   ED Course  Procedures (including critical care time) DIAGNOSTIC STUDIES: Oxygen Saturation is 100% on RA, normal by my interpretation.    COORDINATION OF CARE: 9:18 PM-Discussed treatment plan which includes I&D with pt at bedside and pt agreed to plan.    INCISION AND DRAINAGE Performed by: Antonietta Breach Consent: Verbal consent obtained. Risks and benefits: risks, benefits and alternatives were discussed Type: abscess  Body area: L gluteal cleft  Anesthesia: local infiltration  Incision was made with a scalpel.  Local anesthetic: lidocaine 2% with epinephrine  Anesthetic total: 4 ml  Complexity: complex Blunt dissection to break up loculations  Drainage: purulent  Drainage amount: moderate  Packing material: 1/4 in iodoform gauze  Patient tolerance: Patient tolerated the procedure well with no immediate complications.   Labs Review Labs Reviewed - No data to display  Imaging Review No results found.    EKG Interpretation None      MDM   Final diagnoses:  Pilonidal abscess    Patient with skin abscess amenable to incision and drainage. Wound recheck in 2 days. Encouraged home warm soaks and flushing. Mild signs of cellulitis is surrounding skin.  Will d/c to home. No antibiotic therapy is indicated. Return precautions discussed and provided. Patient agreeable to plan with no unaddressed concerns.  I personally performed the services described in this documentation, which was scribed in my presence.  The recorded information has been reviewed and is accurate.   Filed Vitals:   10/19/14 2044  BP: 143/89  Pulse: 75  Temp: 98 F (36.7 C)  TempSrc: Oral  Resp: 19  Height: 5\' 6"  (1.676 m)  Weight: 204 lb (92.534 kg)  SpO2: 100%     Antonietta Breach, PA-C 10/19/14 2223  Lacretia Leigh, MD 10/19/14 2326

## 2014-10-19 NOTE — ED Notes (Signed)
Patient c/o abscess to tailbone. patient states she noticed it on Friday. Patient states she has had an abscess in the same location in the past, but she is unsure how long ago. Patient denies any medications for pain at home.

## 2014-10-19 NOTE — Discharge Instructions (Signed)
Pilonidal Cyst A pilonidal cyst occurs when hairs get trapped (ingrown) beneath the skin in the crease between the buttocks over your sacrum (the bone under that crease). Pilonidal cysts are most common in young men with a lot of body hair. When the cyst is ruptured (breaks) or leaking, fluid from the cyst may cause burning and itching. If the cyst becomes infected, it causes a painful swelling filled with pus (abscess). The pus and trapped hairs need to be removed (often by lancing) so that the infection can heal. However, recurrence is common and an operation may be needed to remove the cyst. HOME CARE INSTRUCTIONS   If the cyst was NOT INFECTED:  Keep the area clean and dry. Bathe or shower daily. Wash the area well with a germ-killing soap. Warm tub baths may help prevent infection and help with drainage. Dry the area well with a towel.  Avoid tight clothing to keep area as moisture free as possible.  Keep area between buttocks as free of hair as possible. A depilatory may be used.  If the cyst WAS INFECTED and needed to be drained:  Your caregiver packed the wound with gauze to keep the wound open. This allows the wound to heal from the inside outwards and continue draining.  Return for a wound check in 1 day or as suggested.  If you take tub baths or showers, repack the wound with gauze following them. Sponge baths (at the sink) are a good alternative.  If an antibiotic was ordered to fight the infection, take as directed.  Only take over-the-counter or prescription medicines for pain, discomfort, or fever as directed by your caregiver.  After the drain is removed, use sitz baths for 20 minutes 4 times per day. Clean the wound gently with mild unscented soap, pat dry, and then apply a dry dressing. SEEK MEDICAL CARE IF:   You have increased pain, swelling, redness, drainage, or bleeding from the area.  You have a fever.  You have muscles aches, dizziness, or a general ill  feeling. Document Released: 01/22/2000 Document Revised: 04/18/2011 Document Reviewed: 03/21/2008 [employer] Patient Information 2015 [employer]. This information is not intended to replace advice given to you by your health care provider. Make sure you discuss any questions you have with your health care provider.  

## 2014-10-21 ENCOUNTER — Emergency Department (HOSPITAL_COMMUNITY)
Admission: EM | Admit: 2014-10-21 | Discharge: 2014-10-21 | Disposition: A | Discharge status: Home / Self Care | Payer: Medicaid Other | Attending: Emergency Medicine | Admitting: Emergency Medicine

## 2014-10-21 ENCOUNTER — Encounter (HOSPITAL_COMMUNITY): Payer: Self-pay | Admitting: Emergency Medicine

## 2014-10-21 DIAGNOSIS — Z720 Tobacco use: Secondary | ICD-10-CM | POA: Diagnosis not present

## 2014-10-21 DIAGNOSIS — Z4801 Encounter for change or removal of surgical wound dressing: Secondary | ICD-10-CM | POA: Insufficient documentation

## 2014-10-21 DIAGNOSIS — Z79899 Other long term (current) drug therapy: Secondary | ICD-10-CM | POA: Insufficient documentation

## 2014-10-21 DIAGNOSIS — Z4800 Encounter for change or removal of nonsurgical wound dressing: Secondary | ICD-10-CM

## 2014-10-21 DIAGNOSIS — Z8742 Personal history of other diseases of the female genital tract: Secondary | ICD-10-CM | POA: Diagnosis not present

## 2014-10-21 DIAGNOSIS — E559 Vitamin D deficiency, unspecified: Secondary | ICD-10-CM | POA: Insufficient documentation

## 2014-10-21 DIAGNOSIS — Z872 Personal history of diseases of the skin and subcutaneous tissue: Secondary | ICD-10-CM | POA: Diagnosis not present

## 2014-10-21 DIAGNOSIS — Z5189 Encounter for other specified aftercare: Secondary | ICD-10-CM

## 2014-10-21 NOTE — ED Provider Notes (Signed)
CSN: [id number]     Arrival date & time 10/21/14  1701 History  This chart was scribed for non-physician practitioner Waynetta Pean, PA-C, working with Noemi Chapel, MD, by Eustaquio Maize, ED Scribe. This patient was seen in room WTR8/WTR8 and the patient's care was started at 6:01 PM.  Chief Complaint  Patient presents with  . Wound Check   The history is provided by the patient. No language interpreter was used.     HPI Comments: Emily Saunders is a 39 y.o. female who presents to the Emergency Department for wound check of pilonidal cyst. Pt was seen on 10/19/2014 (approximately  2 days ago) for the abscess x 4 days and had I&D done with packing at that time. Pt was not placed on antibiotics and told to come in 2 days for wound recheck. She is still complaining of persistent pain to the area that she rates as a 7.5/10 on the pain scale. She denies discharge or fevers. There is no drainage to the area. Denies fever, chills, or any other associated symptoms.    Past Medical History  Diagnosis Date  . Vitamin D deficiency 2015   . H/O pilonidal cyst 2002   . PID (acute pelvic inflammatory disease) 2006    Past Surgical History  Procedure Laterality Date  . No past surgeries    . Cesarean section    . Cyst excision Right 2003    hand   Family History  Problem Relation Age of Onset  . Hypertension Mother   . Diabetes Mother   . Asthma Mother   . COPD Mother   . Depression Mother   . Miscarriages / [place] Mother   . Vision loss Mother   . Parkinson's disease Father   . Diabetes Maternal Aunt   . Diabetes Maternal Uncle   . Diabetes Cousin    Social History  Substance Use Topics  . Smoking status: Current Some Day Smoker -- 0.75 packs/day    Types: Cigarettes  . Smokeless tobacco: Never Used     Comment: Smoking .5 ppd  . Alcohol Use: No   OB History    Gravida Para Term Preterm AB TAB SAB Ectopic Multiple Living   5 3 3  2  1 1  3      Review of Systems   Constitutional: Negative for fever and chills.  Gastrointestinal: Negative for nausea, vomiting and abdominal pain.  Skin: Positive for wound (Pilonidal cyst). Negative for color change and rash.   Allergies  Review of patient's allergies indicates no known allergies.  Home Medications   Prior to Admission medications   Medication Sig Start Date End Date Taking? Authorizing Provider  calcium citrate-vitamin D 500-400 MG-UNIT chewable tablet Chew 1 tablet by mouth 2 (two) times daily. Patient not taking: Reported on 09/23/2014 03/04/14   Boykin Nearing, MD  cetirizine (ZYRTEC) 10 MG tablet Take 1 tablet (10 mg total) by mouth daily. Patient not taking: Reported on 09/23/2014 06/23/14   Boykin Nearing, MD  HYDROcodone-acetaminophen (NORCO/VICODIN) 5-325 MG per tablet Take 1-2 tablets by mouth every 6 (six) hours as needed for severe pain. 10/19/14   Antonietta Breach, PA-C  Prenat-FeCbn-FeAspGl-FA-Omega (OB COMPLETE PETITE) 35-5-1-200 MG CAPS Take 1 capsule by mouth daily. Patient not taking: Reported on 10/19/2014 09/25/14   Shelly Bombard, MD  Prenatal Vit-Fe Fumarate-FA (MULTIVITAMIN-PRENATAL) 27-0.8 MG TABS tablet Take 1 tablet by mouth daily at 12 noon. 09/23/14   Shelly Bombard, MD  varenicline ([place] PAK)  0.5 MG X 11 & 1 MG X 42 tablet Take one 0.5 mg tablet by mouth once daily for 3 days, then increase to one 0.5 mg tablet twice daily for 4 days, then increase to one 1 mg tablet twice daily. Patient not taking: Reported on 10/19/2014 09/23/14   Shelly Bombard, MD   Triage Vitals: BP 134/91 mmHg  Pulse 70  Temp(Src) 99.2 F (37.3 C) (Oral)  Resp 18  Wt 204 lb (92.534 kg)  SpO2 99%  LMP 09/19/2014 (Exact Date)   Physical Exam  Constitutional: She appears well-developed and well-nourished. No distress.  Nontoxic appearing.  HENT:  Head: Normocephalic and atraumatic.  Eyes: Right eye exhibits no discharge. Left eye exhibits no discharge.  Pulmonary/Chest: Effort  normal. No respiratory distress.  Neurological: She is alert. Coordination normal.  Skin: Skin is warm and dry. No rash noted. She is not diaphoretic. No erythema. No pallor.  Area of I&D to left gluteal cleft that is clean and dry. Packing removed. No erythema, edema, or discharge. No surrounding or overlying erythema.   Psychiatric: She has a normal mood and affect. Her behavior is normal.  Nursing note and vitals reviewed.   ED Course  Procedures (including critical care time)  DIAGNOSTIC STUDIES: Oxygen Saturation is 99% on RA, normal by my interpretation.    COORDINATION OF CARE: 6:07 PM-Discussed treatment plan which includes removal of packing with pt at bedside and pt agreed to plan.   Labs Review Labs Reviewed - No data to display  Imaging Review No results found. I have personally reviewed and evaluated these images and lab results as part of my medical decision-making.   EKG Interpretation None      Filed Vitals:   10/21/14 1708 10/21/14 1730  BP: 134/91   Pulse: 70   Temp: 99.2 F (37.3 C)   TempSrc: Oral   Resp: 18   Weight: 204 lb (92.534 kg)   SpO2: 100% 99%     MDM   Final diagnoses:  Encounter for wound re-check  Encounter for abscess packing removal   This is a 39 year old female who presents to the emergency department for wound recheck after she had an incision and drainage performed on 10/19/2014 for a left gluteal pilonidal abscess. The patient denies fevers, chills or discharge from the site. She reports she still has tenderness to the area. On exam the patient is afebrile and nontoxic appearing. The patient's packing was removed. The area is clean and dry and without discharge. There is no overlying or surrounding erythema. Wound care instructions provided and I encouraged her to follow-up with primary care. No abx indicated at this time. I advised the patient to follow-up with their primary care provider this week. I advised the patient to return  to the emergency department with new or worsening symptoms or new concerns. The patient verbalized understanding and agreement with plan.    I personally performed the services described in this documentation, which was scribed in my presence. The recorded information has been reviewed and is accurate.        Waynetta Pean, PA-C 10/21/14 1815  Noemi Chapel, MD 10/23/14 [phone]

## 2014-10-21 NOTE — ED Notes (Signed)
Pt returned for packing to be removed from coccyx/sacrum area that was placed on Sunday.

## 2014-10-21 NOTE — Discharge Instructions (Signed)
Abscess Care After An abscess (also called a boil or furuncle) is an infected area that contains a collection of pus. Signs and symptoms of an abscess include pain, tenderness, redness, or hardness, or you may feel a moveable soft area under your skin. An abscess can occur anywhere in the body. The infection may spread to surrounding tissues causing cellulitis. A cut (incision) by the surgeon was made over your abscess and the pus was drained out. Gauze may have been packed into the space to provide a drain that will allow the cavity to heal from the inside outwards. The boil may be painful for 5 to 7 days. Most people with a boil do not have high fevers. Your abscess, if seen early, may not have localized, and may not have been lanced. If not, another appointment may be required for this if it does not get better on its own or with medications. HOME CARE INSTRUCTIONS   Only take over-the-counter or prescription medicines for pain, discomfort, or fever as directed by your caregiver.  When you bathe, soak and then remove gauze or iodoform packs at least daily or as directed by your caregiver. You may then wash the wound gently with mild soapy water. Repack with gauze or do as your caregiver directs. SEEK IMMEDIATE MEDICAL CARE IF:   You develop increased pain, swelling, redness, drainage, or bleeding in the wound site.  You develop signs of generalized infection including muscle aches, chills, fever, or a general ill feeling.  An oral temperature above 102 F (38.9 C) develops, not controlled by medication. See your caregiver for a recheck if you develop any of the symptoms described above. If medications (antibiotics) were prescribed, take them as directed. Document Released: 08/12/2004 Document Revised: 04/18/2011 Document Reviewed: 04/09/2007 [facility] Patient Information 2015 [place], [place]. This information is not intended to replace advice given to you by your health care provider. Make sure  you discuss any questions you have with your health care provider.  Dressing Change A dressing is a material placed over wounds. It keeps the wound clean, dry, and protected from further injury. This provides an environment that favors wound healing.  BEFORE YOU BEGIN  Get your supplies together. Things you may need include:  Saline solution.  Flexible gauze dressing.  Medicated cream.  Tape.  Gloves.  Abdominal dressing pads.  Gauze squares.  Plastic bags.  Take pain medicine 30 minutes before the dressing change if you need it.  Take a shower before you do the first dressing change of the day. Use plastic wrap or a plastic bag to prevent the dressing from getting wet. REMOVING YOUR OLD DRESSING   Wash your hands with soap and water. Dry your hands with a clean towel.  Put on your gloves.  Remove any tape.  Carefully remove the old dressing. If the dressing sticks, you may dampen it with warm water to loosen it, or follow your caregiver's specific directions.  Remove any gauze or packing tape that is in your wound.  Take off your gloves.  Put the gloves, tape, gauze, or any packing tape into a plastic bag. CHANGING YOUR DRESSING  Open the supplies.  Take the cap off the saline solution.  Open the gauze package so that the gauze remains on the inside of the package.  Put on your gloves.  Clean your wound as told by your caregiver.  If you have been told to keep your wound dry, follow those instructions.  Your caregiver may tell you  to do one or more of the following:  Pick up the gauze. Pour the saline solution over the gauze. Squeeze out the extra saline solution.  Put medicated cream or other medicine on your wound if you have been told to do so.  Put the solution soaked gauze only in your wound, not on the skin around it.  Pack your wound loosely or as told by your caregiver.  Put dry gauze on your wound.  Put abdominal dressing pads over the dry  gauze if your wet gauze soaks through.  Tape the abdominal dressing pads in place so they will not fall off. Do not wrap the tape completely around the affected part (arm, leg, abdomen).  Wrap the dressing pads with a flexible gauze dressing to secure it in place.  Take off your gloves. Put them in the plastic bag with the old dressing. Tie the bag shut and throw it away.  Keep the dressing clean and dry until your next dressing change.  Wash your hands. SEEK MEDICAL CARE IF:  Your skin around the wound looks red.  Your wound feels more tender or sore.  You see pus in the wound.  Your wound smells bad.  You have a fever.  Your skin around the wound has a rash that itches and burns.  You see black or yellow skin in your wound that was not there before.  You feel nauseous, throw up, and feel very tired. Document Released: 03/03/2004 Document Revised: 04/18/2011 Document Reviewed: 12/06/2010 [facility] Patient Information 2015 [place], [place]. This information is not intended to replace advice given to you by your health care provider. Make sure you discuss any questions you have with your health care provider.

## 2014-10-21 NOTE — ED Notes (Signed)
Awake. Verbally responsive. A/O x4. Resp even and unlabored. No audible adventitious breath sounds noted. ABC's intact.  

## 2014-10-27 ENCOUNTER — Ambulatory Visit: Payer: Medicaid Other | Attending: Family Medicine | Admitting: Family Medicine

## 2014-10-27 ENCOUNTER — Encounter: Payer: Self-pay | Admitting: Family Medicine

## 2014-10-27 VITALS — BP 144/91 | HR 73 | Temp 98.7°F | Resp 16 | Ht 66.0 in | Wt 202.0 lb

## 2014-10-27 DIAGNOSIS — R634 Abnormal weight loss: Secondary | ICD-10-CM

## 2014-10-27 DIAGNOSIS — Z23 Encounter for immunization: Secondary | ICD-10-CM

## 2014-10-27 DIAGNOSIS — L0591 Pilonidal cyst without abscess: Secondary | ICD-10-CM

## 2014-10-27 NOTE — Progress Notes (Signed)
F/U cyst removal Pain scale #5

## 2014-10-27 NOTE — Progress Notes (Signed)
Subjective:  Patient ID: Emily Saunders, female    DOB: [birth year]  Age: 39 y.o. MRN: [id number]  CC: Abscess   HPI Tallyn A Knoedler presents for ED f/u ID of pilonidal abscess on 10/19/2014. She went back to ED on 10/21/14 for wound check. She is here for f/u. She has minimal pain. She has recurrent swelling and abscess formation over the past 5 years. She has been evaluated once by gen surg. She did not have insurance or money to pay for cystectomy.  Weight loss: unintentional. 20 # over past 4 months. Appetite is normal. No GI upset. Had "bad break-up" 2 months ago.   Social History  Substance Use Topics  . Smoking status: Current Some Day Smoker -- 0.75 packs/day    Types: Cigarettes  . Smokeless tobacco: Never Used     Comment: Smoking .5 ppd  . Alcohol Use: No    Outpatient Prescriptions Prior to Visit  Medication Sig Dispense Refill  . calcium citrate-vitamin D 500-400 MG-UNIT chewable tablet Chew 1 tablet by mouth 2 (two) times daily. 180 tablet 1  . cetirizine (ZYRTEC) 10 MG tablet Take 1 tablet (10 mg total) by mouth daily. 30 tablet 11  . Prenat-FeCbn-FeAspGl-FA-Omega (OB COMPLETE PETITE) 35-5-1-200 MG CAPS Take 1 capsule by mouth daily. 30 capsule 11  . Prenatal Vit-Fe Fumarate-FA (MULTIVITAMIN-PRENATAL) 27-0.8 MG TABS tablet Take 1 tablet by mouth daily at 12 noon. 30 each 11  . varenicline (CHANTIX STARTING MONTH PAK) 0.5 MG X 11 & 1 MG X 42 tablet Take one 0.5 mg tablet by mouth once daily for 3 days, then increase to one 0.5 mg tablet twice daily for 4 days, then increase to one 1 mg tablet twice daily. 53 tablet 2  . HYDROcodone-acetaminophen (NORCO/VICODIN) 5-325 MG per tablet Take 1-2 tablets by mouth every 6 (six) hours as needed for severe pain. (Patient not taking: Reported on 10/27/2014) 15 tablet 0   No facility-administered medications prior to visit.    ROS Review of Systems  Constitutional: Negative for fever and chills.  Eyes: Negative for visual  disturbance.  Respiratory: Negative for shortness of breath.   Cardiovascular: Negative for chest pain.  Gastrointestinal: Negative for abdominal pain and blood in stool.  Musculoskeletal: Negative for back pain and arthralgias.  Skin: Positive for wound. Negative for rash.  Allergic/Immunologic: Negative for immunocompromised state.  Hematological: Negative for adenopathy. Does not bruise/bleed easily.  Psychiatric/Behavioral: Negative for suicidal ideas and dysphoric mood.    Objective:  LMP 09/19/2014 (Exact Date)  BP/Weight 10/27/2014 10/21/2014 5/79/0383  Systolic BP 338 329 191  Diastolic BP 91 88 96  Wt. (Lbs) 202 204 204  BMI 32.62 32.94 32.94   Physical Exam  Constitutional: She is oriented to person, place, and time. She appears well-developed and well-nourished. No distress.  HENT:  Head: Normocephalic and atraumatic.  Cardiovascular: Normal rate, regular rhythm, normal heart sounds and intact distal pulses.   Pulmonary/Chest: Effort normal and breath sounds normal.  Musculoskeletal: She exhibits no edema.  Neurological: She is alert and oriented to person, place, and time.  Skin: Skin is warm and dry. No rash noted.     Psychiatric: She has a normal mood and affect.     Assessment & Plan:   Problem List Items Addressed This Visit    Pilonidal cyst - Primary (Chronic)   Relevant Orders   Ambulatory referral to General Surgery   Unintended weight loss      No orders of the defined  types were placed in this encounter.    Follow-up: No Follow-up on file.   Boykin Nearing MD

## 2014-10-27 NOTE — Patient Instructions (Addendum)
Sloan was seen today for abscess.  Diagnoses and all orders for this visit:  Pilonidal cyst -     Ambulatory referral to General Surgery  Unintended weight loss  Other orders -     Flu Vaccine QUAD 36+ mos IM

## 2014-11-24 IMAGING — DX DG CHEST 1V PORT
1 series · 1 of 1 positions shown · non-contrast
Comparison: 11/27/2013

CLINICAL DATA: Cough, shortness of breath, rhinorrhea

EXAM:
PORTABLE CHEST - 1 VIEW

[chest ap]
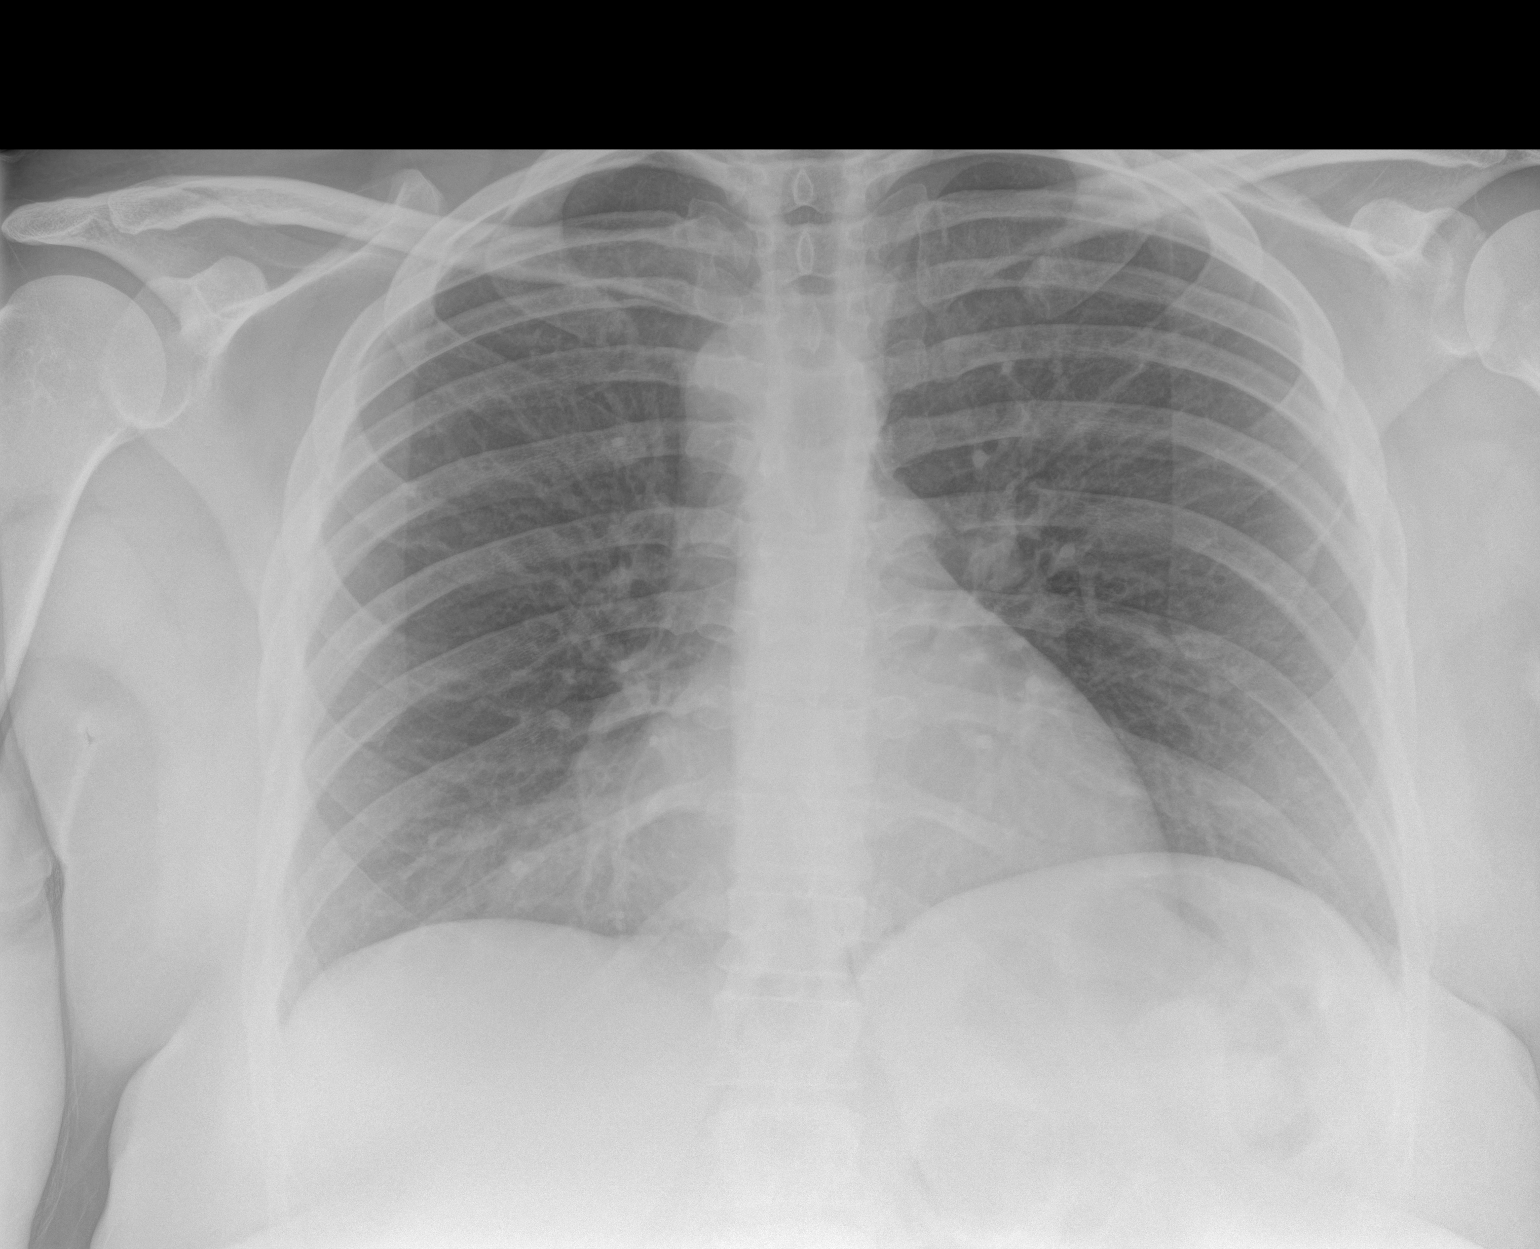

[1 of 1 positions shown; findings below may reference images not displayed]

FINDINGS: Possible mild patchy opacity obscuring the right heart border. No
pleural effusion or pneumothorax.

The heart is normal in size.
IMPRESSION: Possible mild patchy opacity obscuring the right heart border.

Consider PA/ lateral chest radiographs for further evaluation.

## 2014-12-24 ENCOUNTER — Ambulatory Visit (INDEPENDENT_AMBULATORY_CARE_PROVIDER_SITE_OTHER): Payer: Medicaid Other | Admitting: Obstetrics

## 2014-12-24 ENCOUNTER — Encounter: Payer: Self-pay | Admitting: Obstetrics

## 2014-12-24 VITALS — BP 141/99 | HR 75 | Temp 98.4°F | Ht 66.0 in | Wt 207.0 lb

## 2014-12-24 DIAGNOSIS — F17200 Nicotine dependence, unspecified, uncomplicated: Secondary | ICD-10-CM | POA: Diagnosis not present

## 2014-12-24 DIAGNOSIS — N839 Noninflammatory disorder of ovary, fallopian tube and broad ligament, unspecified: Secondary | ICD-10-CM | POA: Diagnosis not present

## 2014-12-24 MED ORDER — CLOMIPHENE CITRATE 50 MG PO TABS: 50.0000 mg | ORAL_TABLET | Freq: Every day | ORAL | Status: DC

## 2014-12-24 MED ORDER — CLOMIPHENE CITRATE 50 MG PO TABS: ORAL_TABLET | ORAL | Status: DC

## 2014-12-24 NOTE — Progress Notes (Signed)
Patient ID: Emily Saunders, female   DOB: [birth year], 39 y.o.   MRN: [id number]  Chief Complaint  Patient presents with  . Follow-up    HPI Emily Saunders is a 39 y.o. female.  Still not able to get pregnant after > 1 year of trying to conceive.  21 day progesterone = 8.8. HPI  Past Medical History  Diagnosis Date  . Vitamin D deficiency 2015   . H/O pilonidal cyst 2002   . PID (acute pelvic inflammatory disease) 2006     Past Surgical History  Procedure Laterality Date  . No past surgeries    . Cesarean section    . Cyst excision Right 2003    hand    Family History  Problem Relation Age of Onset  . Hypertension Mother   . Diabetes Mother   . Asthma Mother   . COPD Mother   . Depression Mother   . Miscarriages / [place] Mother   . Vision loss Mother   . Parkinson's disease Father   . Diabetes Maternal Aunt   . Diabetes Maternal Uncle   . Diabetes Cousin     Social History Social History  Substance Use Topics  . Smoking status: Current Some Day Smoker -- 0.75 packs/day    Types: Cigarettes  . Smokeless tobacco: Never Used     Comment: Smoking .5 ppd  . Alcohol Use: No    No Known Allergies  Current Outpatient Prescriptions  Medication Sig Dispense Refill  . Prenat-FeCbn-FeAspGl-FA-Omega (OB COMPLETE PETITE) 35-5-1-200 MG CAPS Take 1 capsule by mouth daily. 30 capsule 11  . varenicline (CHANTIX STARTING MONTH PAK) 0.5 MG X 11 & 1 MG X 42 tablet Take one 0.5 mg tablet by mouth once daily for 3 days, then increase to one 0.5 mg tablet twice daily for 4 days, then increase to one 1 mg tablet twice daily. 53 tablet 2  . calcium citrate-vitamin D 500-400 MG-UNIT chewable tablet Chew 1 tablet by mouth 2 (two) times daily. (Patient not taking: Reported on 12/24/2014) 180 tablet 1  . cetirizine (ZYRTEC) 10 MG tablet Take 1 tablet (10 mg total) by mouth daily. (Patient not taking: Reported on 12/24/2014) 30 tablet 11  . clomiPHENE (CLOMID) 50 MG tablet One tablet  po daily starting days 5 - 9 of cycle. 5 tablet 0   No current facility-administered medications for this visit.    Review of Systems Review of Systems Constitutional: negative for fatigue and weight loss Respiratory: negative for cough and wheezing Cardiovascular: negative for chest pain, fatigue and palpitations Gastrointestinal: negative for abdominal pain and change in bowel habits Genitourinary:negative Integument/breast: negative for nipple discharge Musculoskeletal:negative for myalgias Neurological: negative for gait problems and tremors Behavioral/Psych: negative for abusive relationship, depression Endocrine: negative for temperature intolerance     Blood pressure 141/99, pulse 75, temperature 98.4 F (36.9 C), height 5\' 6"  (1.676 m), weight 207 lb (93.895 kg), last menstrual period 12/06/2014.  Physical Exam Physical Exam: Deferred  100% of 15 min visit spent on counseling and coordination of care.   Data Reviewed Labs Previous Ultrasound  Assessment     Disorder of Ovulation     Plan    Clomid Rx  F/U prn  No orders of the defined types were placed in this encounter.   Meds ordered this encounter  Medications  . DISCONTD: clomiPHENE (CLOMID) 50 MG tablet    Sig: Take 1 tablet (50 mg total) by mouth daily.    Dispense:  5  tablet    Refill:  0  . clomiPHENE (CLOMID) 50 MG tablet    Sig: One tablet po daily starting days 5 - 9 of cycle.    Dispense:  5 tablet    Refill:  0

## 2014-12-25 ENCOUNTER — Encounter: Payer: Self-pay | Admitting: Obstetrics

## 2014-12-31 ENCOUNTER — Ambulatory Visit: Payer: Medicaid Other | Attending: Family Medicine | Admitting: Pharmacist

## 2014-12-31 ENCOUNTER — Encounter: Payer: Self-pay | Admitting: Pharmacist

## 2014-12-31 VITALS — BP 138/88 | HR 66 | Wt 208.2 lb

## 2014-12-31 DIAGNOSIS — R030 Elevated blood-pressure reading, without diagnosis of hypertension: Secondary | ICD-10-CM | POA: Insufficient documentation

## 2014-12-31 DIAGNOSIS — IMO0001 Reserved for inherently not codable concepts without codable children: Secondary | ICD-10-CM

## 2014-12-31 NOTE — Progress Notes (Signed)
S:    Patient arrives in good spirits.  Presents to the clinic for blood pressure evaluation.   She went to the OB/GYN and was told that her blood pressure was elevated and that she needed to get it checked again at her primary care office.   Patient reports adherence with medications.  Current BP Medications include:  none  Antihypertensives tried in the past include: none  Patient denies any pain or stress currently.   O:   Last 3 Office BP readings: BP Readings from Last 3 Encounters:  12/24/14 141/99  10/27/14 144/91  10/21/14 153/88    BMET    Component Value Date/Time   NA 144 09/23/2014 1440   K 4.0 09/23/2014 1440   CL 109 09/23/2014 1440   CO2 25 09/23/2014 1440   GLUCOSE 75 09/23/2014 1440   BUN 10 09/23/2014 1440   CREATININE 0.96 09/23/2014 1440   CALCIUM 9.7 09/23/2014 1440   GFRNONAA 80 03/03/2014 1558   GFRAA >89 03/03/2014 1558    A/P: History of high blood pressure currently with elevated blood pressure of 151/99 initially and then 138/88 on manual recheck. Patient not on any medications for blood pressure. Patient does not have a diagnosis of hypertension so will refer to Dr. Adrian Blackwater for further evaluation.  Medication reconciliation completed. Results reviewed and written information provided.  Provided written information on DASH diet. Total time in face-to-face counseling 20 minutes.   F/U Clinic Visit with Dr. Adrian Blackwater - patient to schedule at check out.

## 2014-12-31 NOTE — Patient Instructions (Addendum)
Emily Saunders  Follow up with Dr. Adrian Blackwater  DASH Eating Plan DASH stands for "Dietary Approaches to Stop Hypertension." The DASH eating plan is a healthy eating plan that has been shown to reduce high blood pressure (hypertension). Additional health benefits may include reducing the risk of type 2 diabetes mellitus, heart disease, and stroke. The DASH eating plan may also help with weight loss. WHAT DO I NEED TO KNOW ABOUT THE DASH EATING PLAN? For the DASH eating plan, you will follow these general guidelines:  Choose foods with a percent daily value for sodium of less than 5% (as listed on the food label).  Use salt-free seasonings or herbs instead of table salt or sea salt.  Check with your health care provider or pharmacist before using salt substitutes.  Eat lower-sodium products, often labeled as "lower sodium" or "no salt added."  Eat fresh foods.  Eat more vegetables, fruits, and low-fat dairy products.  Choose whole grains. Look for the word "whole" as the first word in the ingredient list.  Choose fish and skinless chicken or [place] more often than red meat. Limit fish, poultry, and meat to 6 oz (170 g) each day.  Limit sweets, desserts, sugars, and sugary drinks.  Choose heart-healthy fats.  Limit cheese to 1 oz (28 g) per day.  Eat more home-cooked food and less restaurant, buffet, and fast food.  Limit fried foods.  Cook foods using methods other than frying.  Limit canned vegetables. If you do use them, rinse them well to decrease the sodium.  When eating at a restaurant, ask that your food be prepared with less salt, or no salt if possible. WHAT FOODS CAN I EAT? Seek help from a dietitian for individual calorie needs. Grains Whole grain or whole wheat bread. Brown rice. Whole grain or whole wheat pasta. Quinoa, bulgur, and whole grain cereals. Low-sodium cereals. Corn or whole wheat flour tortillas. Whole grain cornbread. Whole grain  crackers. Low-sodium crackers. Vegetables Fresh or frozen vegetables (raw, steamed, roasted, or grilled). Low-sodium or reduced-sodium tomato and vegetable juices. Low-sodium or reduced-sodium tomato sauce and paste. Low-sodium or reduced-sodium canned vegetables.  Fruits All fresh, canned (in natural juice), or frozen fruits. Meat and Other Protein Products Ground beef (85% or leaner), grass-fed beef, or beef trimmed of fat. Skinless chicken or [place]. Ground chicken or [place]. Pork trimmed of fat. All fish and seafood. Eggs. Dried beans, peas, or lentils. Unsalted nuts and seeds. Unsalted canned beans. Dairy Low-fat dairy products, such as skim or 1% milk, 2% or reduced-fat cheeses, low-fat ricotta or cottage cheese, or plain low-fat yogurt. Low-sodium or reduced-sodium cheeses. Fats and Oils Tub margarines without trans fats. Light or reduced-fat mayonnaise and salad dressings (reduced sodium). Avocado. Safflower, olive, or canola oils. Natural peanut or almond butter. Other Unsalted popcorn and pretzels. The items listed above may not be a complete list of recommended foods or beverages. Contact your dietitian for more options. WHAT FOODS ARE NOT RECOMMENDED? Grains White bread. White pasta. White rice. Refined cornbread. Bagels and croissants. Crackers that contain trans fat. Vegetables Creamed or fried vegetables. Vegetables in a cheese sauce. Regular canned vegetables. Regular canned tomato sauce and paste. Regular tomato and vegetable juices. Fruits Dried fruits. Canned fruit in light or heavy syrup. Fruit juice. Meat and Other Protein Products Fatty cuts of meat. Ribs, chicken wings, bacon, sausage, bologna, salami, chitterlings, fatback, hot dogs, bratwurst, and packaged luncheon meats. Salted nuts and seeds. Canned beans with salt. Dairy  Whole or 2% milk, cream, half-and-half, and cream cheese. Whole-fat or sweetened yogurt. Full-fat cheeses or blue cheese. Nondairy creamers and  whipped toppings. Processed cheese, cheese spreads, or cheese curds. Condiments Onion and garlic salt, seasoned salt, table salt, and sea salt. Canned and packaged gravies. Worcestershire sauce. Tartar sauce. Barbecue sauce. Teriyaki sauce. Soy sauce, including reduced sodium. Steak sauce. Fish sauce. Oyster sauce. Cocktail sauce. Horseradish. Ketchup and mustard. Meat flavorings and tenderizers. Bouillon cubes. Hot sauce. Tabasco sauce. Marinades. Taco seasonings. Relishes. Fats and Oils Butter, stick margarine, lard, shortening, ghee, and bacon fat. Coconut, palm kernel, or palm oils. Regular salad dressings. Other Pickles and olives. Salted popcorn and pretzels. The items listed above may not be a complete list of foods and beverages to avoid. Contact your dietitian for more information. WHERE CAN I FIND MORE INFORMATION? National Heart, Lung, and Blood Institute: travelstabloid.com   This information is not intended to replace advice given to you by your health care provider. Make sure you discuss any questions you have with your health care provider.   Document Released: 01/13/2011 Document Revised: 02/14/2014 Document Reviewed: 11/28/2012 Elsevier Interactive Patient Education [employer].

## 2015-01-29 ENCOUNTER — Ambulatory Visit (INDEPENDENT_AMBULATORY_CARE_PROVIDER_SITE_OTHER): Payer: Medicaid Other | Admitting: Obstetrics

## 2015-01-29 ENCOUNTER — Encounter: Payer: Self-pay | Admitting: Obstetrics

## 2015-01-29 VITALS — BP 159/109 | HR 77 | Temp 98.1°F | Wt 214.0 lb

## 2015-01-29 DIAGNOSIS — I10 Essential (primary) hypertension: Secondary | ICD-10-CM | POA: Diagnosis not present

## 2015-01-29 DIAGNOSIS — N839 Noninflammatory disorder of ovary, fallopian tube and broad ligament, unspecified: Secondary | ICD-10-CM

## 2015-01-29 MED ORDER — CLOMIPHENE CITRATE 50 MG PO TABS: ORAL_TABLET | ORAL | Status: DC

## 2015-01-29 NOTE — Progress Notes (Signed)
Patient ID: Emily Saunders, female   DOB: July 11, 1975, 39 y.o.   MRN: [id number]  No chief complaint on file.   HPI Emily Saunders is a 39 y.o. female.  Patient presents for F/U after 1 course of Clomid for ovulation disorder.  HPI  Past Medical History  Diagnosis Date  . Vitamin D deficiency 2015   . H/O pilonidal cyst 2002   . PID (acute pelvic inflammatory disease) 2006     Past Surgical History  Procedure Laterality Date  . No past surgeries    . Cesarean section    . Cyst excision Right 2003    hand    Family History  Problem Relation Age of Onset  . Hypertension Mother   . Diabetes Mother   . Asthma Mother   . COPD Mother   . Depression Mother   . Miscarriages / [place] Mother   . Vision loss Mother   . Parkinson's disease Father   . Diabetes Maternal Aunt   . Diabetes Maternal Uncle   . Diabetes Cousin     Social History Social History  Substance Use Topics  . Smoking status: Current Some Day Smoker -- 0.75 packs/day    Types: Cigarettes  . Smokeless tobacco: Never Used     Comment: Smoking .5 ppd  . Alcohol Use: No    No Known Allergies  Current Outpatient Prescriptions  Medication Sig Dispense Refill  . clomiPHENE (CLOMID) 50 MG tablet One tablet po daily starting days 5 - 9 of cycle. 5 tablet 0  . Prenat-FeCbn-FeAspGl-FA-Omega (OB COMPLETE PETITE) 35-5-1-200 MG CAPS Take 1 capsule by mouth daily. 30 capsule 11  . varenicline (CHANTIX STARTING MONTH PAK) 0.5 MG X 11 & 1 MG X 42 tablet Take one 0.5 mg tablet by mouth once daily for 3 days, then increase to one 0.5 mg tablet twice daily for 4 days, then increase to one 1 mg tablet twice daily. (Patient not taking: Reported on 01/29/2015) 53 tablet 2   No current facility-administered medications for this visit.    Review of Systems Review of Systems Constitutional: negative for fatigue and weight loss Respiratory: negative for cough and wheezing Cardiovascular: negative for chest pain, fatigue  and palpitations Gastrointestinal: negative for abdominal pain and change in bowel habits Genitourinary:negative Integument/breast: negative for nipple discharge Musculoskeletal:negative for myalgias Neurological: negative for gait problems and tremors Behavioral/Psych: negative for abusive relationship, depression Endocrine: negative for temperature intolerance     Last menstrual period 01/27/2015.  Physical Exam Physical Exam:  Deferred  100% of 10 min visit spent on counseling and coordination of care.   Data Reviewed Progesterone level  Assessment     Ovulation disorder.  Progesterone level WNL's.     Plan    Will try one more course of Clomid.   F/U in 3 months.  May need referral to Rep. Endo. No orders of the defined types were placed in this encounter.   Meds ordered this encounter  Medications  . clomiPHENE (CLOMID) 50 MG tablet    Sig: One tablet po daily starting days 5 - 9 of cycle.    Dispense:  5 tablet    Refill:  0

## 2015-01-30 NOTE — Progress Notes (Signed)
Note done

## 2015-02-08 NOTE — L&D Delivery Note (Signed)
  Patient is 40 y.o. [id number] [redacted]w[redacted]d admitted for IOL due to chronic hypertension, hx of c-section   Delivery Note At 3:16 PM a viable female was delivered via VBAC, Spontaneous (Presentation: vertex; LOA).  APGAR: 8, 9; weight pending.   Placenta status: spontaneously delivered with gentle traction, intact.  Cord: 3vc with the following complications: none.  Cord pH: not sent  Anesthesia:  epidural Episiotomy: None Lacerations: None Suture Repair: none Est. Blood Loss (mL):  50 mL  Mom to postpartum.  Baby to Couplet care / Skin to Skin.  [name] 12/16/2015, 3:29 PM  Upon arrival patient was complete and pushing. She pushed with good maternal effort to deliver a healthy baby girl. Baby delivered without difficulty, was noted to have good tone and place on maternal abdomen for oral suctioning, drying and stimulation. Delayed cord clamping performed. Placenta delivered intact with 3V cord. Vaginal canal and perineum was inspected and without lacerations; hemostatic. Pitocin was started and uterus massaged until bleeding slowed. Counts of sharps, instruments, and lap pads were all correct.   [name], DO PGY-1 11/8/20173:29 PM   OB FELLOW DELIVERY ATTESTATION  I was gloved and present for the delivery in its entirety, and I agree with the above resident's note.  This was a successful VBAC delivery.   Katherine Basset, DO OB Fellow

## 2015-02-27 ENCOUNTER — Telehealth: Payer: Self-pay | Admitting: Family Medicine

## 2015-02-27 DIAGNOSIS — I10 Essential (primary) hypertension: Secondary | ICD-10-CM

## 2015-02-27 MED ORDER — AMLODIPINE BESYLATE 5 MG PO TABS: 5.0000 mg | ORAL_TABLET | Freq: Every day | ORAL | Status: DC

## 2015-02-27 NOTE — Telephone Encounter (Signed)
Pt. Called stating that her BP has been high. PT. Checked her BP last and it was 153/111. Please f/u with pt.

## 2015-02-27 NOTE — Telephone Encounter (Signed)
Please call patient  For hypertension,   Norvasc 5 mg daily ordered and sent to [facility] Patient should eat low salt Follow up appt needed for labs and exam

## 2015-02-27 NOTE — Telephone Encounter (Signed)
CMA called patient, patient did not answer. Left message for patient to return my call at [facility].

## 2015-03-03 ENCOUNTER — Telehealth: Payer: Self-pay

## 2015-03-03 NOTE — Telephone Encounter (Signed)
CMA called patient, patient verified name and DOB. Patient was given the instruction of picking up new meds Norvasc for her BP. Patient verbalized she understood with no further questions, and stated that she would call the front for Follow up appt needed for labs and exam.

## 2015-03-03 NOTE — Telephone Encounter (Signed)
CMA called patient, patient didn't answer. I left message for the patient to return my asap. 2nd attempt trying to reach patient.

## 2015-03-03 NOTE — Telephone Encounter (Signed)
Pt. Returned call. Please f/u with pt. °

## 2015-04-22 ENCOUNTER — Ambulatory Visit: Payer: Medicaid Other | Admitting: Obstetrics

## 2015-04-23 ENCOUNTER — Encounter: Payer: Self-pay | Admitting: Obstetrics

## 2015-04-23 ENCOUNTER — Ambulatory Visit (INDEPENDENT_AMBULATORY_CARE_PROVIDER_SITE_OTHER): Payer: Medicaid Other | Admitting: Obstetrics

## 2015-04-23 VITALS — BP 140/87 | HR 94 | Wt 222.0 lb

## 2015-04-23 DIAGNOSIS — O09521 Supervision of elderly multigravida, first trimester: Secondary | ICD-10-CM

## 2015-04-23 DIAGNOSIS — N926 Irregular menstruation, unspecified: Secondary | ICD-10-CM

## 2015-04-23 DIAGNOSIS — Z3201 Encounter for pregnancy test, result positive: Secondary | ICD-10-CM

## 2015-04-23 DIAGNOSIS — I10 Essential (primary) hypertension: Secondary | ICD-10-CM

## 2015-04-23 LAB — POCT URINE PREGNANCY: Preg Test, Ur: POSITIVE — AB

## 2015-04-23 MED ORDER — VITAFOL-NANO 18-0.6-0.4 MG PO TABS: 1.0000 | ORAL_TABLET | Freq: Every day | ORAL | Status: DC

## 2015-04-23 MED ORDER — LABETALOL HCL 200 MG PO TABS: 200.0000 mg | ORAL_TABLET | Freq: Three times a day (TID) | ORAL | Status: DC

## 2015-04-23 NOTE — Progress Notes (Signed)
Patient ID: [name], female   DOB: Dec 15, 1975, 40 y.o.   MRN: [id number]  Chief Complaint  Patient presents with  . Follow-up    clomid use, last used couple months ago    HPI Emily Saunders is a 40 y.o. female.  H/O oligo-ovulation.  S/P 2 cycles of Clomid.  Presents today for F/U.  LMP 03-18-15.  HPI  Past Medical History  Diagnosis Date  . Vitamin D deficiency 2015   . H/O pilonidal cyst 2002   . PID (acute pelvic inflammatory disease) 2006     Past Surgical History  Procedure Laterality Date  . No past surgeries    . Cesarean section    . Cyst excision Right 2003    hand    Family History  Problem Relation Age of Onset  . Hypertension Mother   . Diabetes Mother   . Asthma Mother   . COPD Mother   . Depression Mother   . Miscarriages / [place] Mother   . Vision loss Mother   . Parkinson's disease Father   . Diabetes Maternal Aunt   . Diabetes Maternal Uncle   . Diabetes Cousin     Social History Social History  Substance Use Topics  . Smoking status: Current Some Day Smoker -- 0.75 packs/day    Types: Cigarettes  . Smokeless tobacco: Never Used     Comment: Smoking .5 ppd  . Alcohol Use: No    No Known Allergies  Current Outpatient Prescriptions  Medication Sig Dispense Refill  . labetalol (NORMODYNE) 200 MG tablet Take 1 tablet (200 mg total) by mouth 3 (three) times daily. 90 tablet 3  . Prenat-FeCbn-FeAspGl-FA-Omega (OB COMPLETE PETITE) 35-5-1-200 MG CAPS Take 1 capsule by mouth daily. 30 capsule 11  . amLODipine (NORVASC) 5 MG tablet Take 1 tablet (5 mg total) by mouth daily. (Patient not taking: Reported on 04/23/2015) 30 tablet 1  . clomiPHENE (CLOMID) 50 MG tablet One tablet po daily starting days 5 - 9 of cycle. (Patient not taking: Reported on 04/23/2015) 5 tablet 0  . Prenatal-Fe Fum-Methf-FA w/o A (VITAFOL-NANO) 18-0.6-0.4 MG TABS Take 1 tablet by mouth daily before breakfast. 90 tablet 3  . varenicline (CHANTIX STARTING MONTH PAK) 0.5  MG X 11 & 1 MG X 42 tablet Take one 0.5 mg tablet by mouth once daily for 3 days, then increase to one 0.5 mg tablet twice daily for 4 days, then increase to one 1 mg tablet twice daily. (Patient not taking: Reported on 01/29/2015) 53 tablet 2   No current facility-administered medications for this visit.    Review of Systems Review of Systems Constitutional: negative for fatigue and weight loss Respiratory: negative for cough and wheezing Cardiovascular: negative for chest pain, fatigue and palpitations Gastrointestinal: negative for abdominal pain and change in bowel habits Genitourinary:negative Integument/breast: negative for nipple discharge Musculoskeletal:negative for myalgias Neurological: negative for gait problems and tremors Behavioral/Psych: negative for abusive relationship, depression Endocrine: negative for temperature intolerance     Blood pressure 140/87, pulse 94, weight 222 lb (100.699 kg), last menstrual period 03/18/2015.  Physical Exam Physical Exam:  Deferred  100% of 15 min visit spent on counseling and coordination of care.   Data Reviewed UPT  Assessment     Positive UPT ~ [redacted] weeks pregnant by LMP  AMA.  Will need referral to MFM and for Genetic Counseling    Plan    Pregnancy counseling and advice done on management of high risk pregnancy with  AMA PNV's Rx F/U in 5 weeks.  Orders Placed This Encounter  Procedures  . POCT urine pregnancy   Meds ordered this encounter  Medications  . DISCONTD: labetalol (NORMODYNE) 100 MG tablet    Sig: Take 100 mg by mouth 3 (three) times daily.  Marland Kitchen labetalol (NORMODYNE) 200 MG tablet    Sig: Take 1 tablet (200 mg total) by mouth 3 (three) times daily.    Dispense:  90 tablet    Refill:  3  . Prenatal-Fe Fum-Methf-FA w/o A (VITAFOL-NANO) 18-0.6-0.4 MG TABS    Sig: Take 1 tablet by mouth daily before breakfast.    Dispense:  90 tablet    Refill:  3

## 2015-05-27 ENCOUNTER — Telehealth: Payer: Self-pay | Admitting: *Deleted

## 2015-05-27 NOTE — Telephone Encounter (Signed)
Patient states the BP medication is making her nauseous.

## 2015-06-01 ENCOUNTER — Encounter: Payer: Self-pay | Admitting: Obstetrics

## 2015-06-01 ENCOUNTER — Ambulatory Visit (INDEPENDENT_AMBULATORY_CARE_PROVIDER_SITE_OTHER): Payer: Medicaid Other | Admitting: Obstetrics

## 2015-06-01 VITALS — BP 133/86 | HR 74 | Wt 232.0 lb

## 2015-06-01 DIAGNOSIS — J301 Allergic rhinitis due to pollen: Secondary | ICD-10-CM

## 2015-06-01 DIAGNOSIS — O3680X1 Pregnancy with inconclusive fetal viability, fetus 1: Secondary | ICD-10-CM

## 2015-06-01 DIAGNOSIS — G44019 Episodic cluster headache, not intractable: Secondary | ICD-10-CM

## 2015-06-01 DIAGNOSIS — Z3491 Encounter for supervision of normal pregnancy, unspecified, first trimester: Secondary | ICD-10-CM

## 2015-06-01 DIAGNOSIS — N390 Urinary tract infection, site not specified: Secondary | ICD-10-CM

## 2015-06-01 LAB — POCT URINALYSIS DIPSTICK
Bilirubin, UA: NEGATIVE
Blood, UA: NEGATIVE
GLUCOSE UA: NEGATIVE
Ketones, UA: NEGATIVE
Leukocytes, UA: NEGATIVE
PH UA: 6
Protein, UA: NEGATIVE
Spec Grav, UA: 1.01
UROBILINOGEN UA: NEGATIVE

## 2015-06-01 MED ORDER — BUTALBITAL-APAP-CAFFEINE 50-325-40 MG PO TABS: 2.0000 | ORAL_TABLET | Freq: Four times a day (QID) | ORAL | Status: DC | PRN

## 2015-06-01 MED ORDER — LORATADINE 10 MG PO TABS: 10.0000 mg | ORAL_TABLET | Freq: Every day | ORAL | Status: DC

## 2015-06-01 MED ORDER — NITROFURANTOIN MONOHYD MACRO 100 MG PO CAPS: 100.0000 mg | ORAL_CAPSULE | Freq: Two times a day (BID) | ORAL | Status: DC

## 2015-06-01 NOTE — Progress Notes (Signed)
Patient states she is having bad headaches  & BP medications makes her nausea. Also her allergies are bothering her.

## 2015-06-01 NOTE — Progress Notes (Signed)
  Subjective:    Emily Saunders is a 40 y.o. female being seen today for her obstetrical visit. She is at [redacted]w[redacted]d gestation. Patient reports: no complaints.  Problem List Items Addressed This Visit    None    Visit Diagnoses    Prenatal care, first trimester    -  Primary    Relevant Orders    POCT urinalysis dipstick (Completed)    Encounter to determine fetal viability of pregnancy, fetus 1        Relevant Orders    US OB Transvaginal    US OB Comp Less 14 Wks    Rhinitis due to pollen        Relevant Medications    loratadine (CLARITIN) 10 MG tablet    Episodic cluster headache, not intractable        Relevant Medications    butalbital-acetaminophen-caffeine (FIORICET) 50-325-40 MG tablet    Urinary tract infection without hematuria, site unspecified        Relevant Medications    nitrofurantoin, macrocrystal-monohydrate, (MACROBID) 100 MG capsule    Other Relevant Orders    Urine Culture      Patient Active Problem List   Diagnosis Date Noted  . HTN (hypertension) 02/27/2015  . Unintended weight loss 10/27/2014  . Allergic rhinitis 06/23/2014  . Elevated BP 06/23/2014  . 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 Vitamin D insufficiency 03/03/2014  . Pilonidal cyst 03/03/2014  . Broken teeth 03/03/2014  . Poor sleep pattern 03/03/2014  . Obesity (BMI 30-39.9) 03/03/2014  . Current smoker 03/03/2014  . Anemia 05/27/2013    Objective:     BP 133/86 mmHg  Pulse 74  Wt 232 lb (105.235 kg)  LMP 03/18/2015 (LMP Unknown) Uterine Size: Below umbilicus     Assessment:    Pregnancy @ [redacted]w[redacted]d  weeks  Absent FHR with doppler  Allergic rhinitis  Migraine HA  UTI  Plan:   Ultrasound ordered for confirmation of dating and viability Loratadine Rx Fioricet Rx Macrobid Rx F/U 2 weeks     Problem list reviewed and updated. Labs reviewed.  Follow up in 2 weeks. FIRST/CF mutation testing/NIPT/QUAD SCREEN/fragile X/Ashkenazi Jewish population testing/Spinal muscular  atrophy discussed: requested. Role of ultrasound in pregnancy discussed; fetal survey: requested. Amniocentesis discussed: undecided

## 2015-06-02 ENCOUNTER — Encounter (HOSPITAL_COMMUNITY): Payer: Self-pay | Admitting: *Deleted

## 2015-06-02 ENCOUNTER — Inpatient Hospital Stay (HOSPITAL_COMMUNITY)
Admission: AD | Admit: 2015-06-02 | Discharge: 2015-06-02 | Disposition: A | Discharge status: Home / Self Care | Payer: Medicaid Other | Source: Ambulatory Visit | Attending: Obstetrics | Admitting: Obstetrics

## 2015-06-02 DIAGNOSIS — O26891 Other specified pregnancy related conditions, first trimester: Secondary | ICD-10-CM | POA: Diagnosis not present

## 2015-06-02 DIAGNOSIS — Z3A10 10 weeks gestation of pregnancy: Secondary | ICD-10-CM | POA: Insufficient documentation

## 2015-06-02 DIAGNOSIS — Z87891 Personal history of nicotine dependence: Secondary | ICD-10-CM | POA: Insufficient documentation

## 2015-06-02 DIAGNOSIS — R51 Headache: Secondary | ICD-10-CM | POA: Diagnosis not present

## 2015-06-02 DIAGNOSIS — E559 Vitamin D deficiency, unspecified: Secondary | ICD-10-CM | POA: Diagnosis not present

## 2015-06-02 DIAGNOSIS — K5900 Constipation, unspecified: Secondary | ICD-10-CM | POA: Insufficient documentation

## 2015-06-02 DIAGNOSIS — R519 Headache, unspecified: Secondary | ICD-10-CM

## 2015-06-02 DIAGNOSIS — Z79899 Other long term (current) drug therapy: Secondary | ICD-10-CM | POA: Insufficient documentation

## 2015-06-02 LAB — URINALYSIS, ROUTINE W REFLEX MICROSCOPIC
Bilirubin Urine: NEGATIVE
Glucose, UA: NEGATIVE mg/dL
Hgb urine dipstick: NEGATIVE
Ketones, ur: 15 mg/dL — AB
LEUKOCYTES UA: NEGATIVE
NITRITE: NEGATIVE
Protein, ur: NEGATIVE mg/dL
Specific Gravity, Urine: 1.02 (ref 1.005–1.030)
pH: 6.5 (ref 5.0–8.0)

## 2015-06-02 MED ORDER — DEXAMETHASONE SODIUM PHOSPHATE 10 MG/ML IJ SOLN
10.0000 mg | Freq: Once | INTRAMUSCULAR | Status: AC
  Administered 2015-06-02: 10 mg via INTRAVENOUS
  Filled 2015-06-02: qty 1

## 2015-06-02 MED ORDER — METOCLOPRAMIDE HCL 5 MG/ML IJ SOLN
10.0000 mg | Freq: Once | INTRAMUSCULAR | Status: AC
  Administered 2015-06-02: 10 mg via INTRAVENOUS
  Filled 2015-06-02: qty 2

## 2015-06-02 MED ORDER — SODIUM CHLORIDE 0.9 % IV BOLUS (SEPSIS)
1000.0000 mL | Freq: Once | INTRAVENOUS | Status: AC
  Administered 2015-06-02: 1000 mL via INTRAVENOUS

## 2015-06-02 MED ORDER — DIPHENHYDRAMINE HCL 50 MG/ML IJ SOLN
25.0000 mg | Freq: Once | INTRAMUSCULAR | Status: AC
  Administered 2015-06-02: 25 mg via INTRAVENOUS
  Filled 2015-06-02: qty 1

## 2015-06-02 NOTE — MAU Note (Signed)
PT SAYS  STARTED LAST  ON 4-25-  SPINNING     HEADACHE - NO MEDS.      SHE WENT  TO DR  HARPER YESTERDAY-   TOLD  ABOUT H/A-  GAVE  HER FIORCET-    SHE  TOOK 2 TABS  AT 12NOON-    NO RELIEF-      SHE TAKES  MEDS   FOR  BP.        SHE DID  NOT  CALL OFFICE  TODAY

## 2015-06-02 NOTE — MAU Note (Signed)
Headache since April 16th, bot relieved by anything. Took 2 Fioricet yesterday and it did not help.

## 2015-06-02 NOTE — Discharge Instructions (Signed)

## 2015-06-02 NOTE — MAU Provider Note (Signed)
History     CSN: [id number]  Arrival date and time: 06/02/15 1956   First Provider Initiated Contact with Patient 06/02/15 2125        Chief Complaint  Patient presents with  . Headache   HPI Emily Saunders is a 40 y.o. [id number] at [redacted]w[redacted]d who presents to MAU today with complaint of headache since Sunday. She called the office about this yesterday and was given Emily Saunders. She tried that without relief. She rates pain at 8/10 now. She notes mild photosensitivity. She denies N/V/D, vaginal bleeding or abdominal pain. She does endorse mild constipation.   OB History    Gravida Para Term Preterm AB TAB SAB Ectopic Multiple Living   6 3 3  2  1 1  3       Past Medical History  Diagnosis Date  . Vitamin D deficiency 2015   . H/O pilonidal cyst 2002   . PID (acute pelvic inflammatory disease) 2006     Past Surgical History  Procedure Laterality Date  . No past surgeries    . Cesarean section    . Cyst excision Right 2003    hand    Family History  Problem Relation Age of Onset  . Hypertension Mother   . Diabetes Mother   . Asthma Mother   . COPD Mother   . Depression Mother   . Miscarriages / [place] Mother   . Vision loss Mother   . Parkinson's disease Father   . Diabetes Maternal Aunt   . Diabetes Maternal Uncle   . Diabetes Cousin     Social History  Substance Use Topics  . Smoking status: Former Smoker -- 0.75 packs/day    Types: Cigarettes  . Smokeless tobacco: Never Used     Comment: Smoking .5 ppd  . Alcohol Use: No    Allergies: No Known Allergies  Prescriptions prior to admission  Medication Sig Dispense Refill Last Dose  . butalbital-acetaminophen-caffeine (Emily Saunders) 50-325-40 MG tablet Take 2 tablets by mouth every 6 (six) hours as needed for headache. 40 tablet 2 06/02/2015 at Unknown time  . calcium carbonate (TUMS - DOSED IN MG ELEMENTAL CALCIUM) 500 MG chewable tablet Chew 2 tablets by mouth 2 (two) times daily as needed for indigestion  or heartburn.   06/01/2015 at Unknown time  . labetalol (NORMODYNE) 200 MG tablet Take 1 tablet (200 mg total) by mouth 3 (three) times daily. 90 tablet 3 06/02/2015 at 0900  . loratadine (CLARITIN) 10 MG tablet Take 1 tablet (10 mg total) by mouth daily. 30 tablet 11 06/02/2015 at Unknown time  . nitrofurantoin, macrocrystal-monohydrate, (MACROBID) 100 MG capsule Take 1 capsule (100 mg total) by mouth 2 (two) times daily. 1 po BID x 7days 14 capsule 1 06/02/2015 at Unknown time  . Prenatal-Fe Fum-Methf-FA w/o A (VITAFOL-NANO) 18-0.6-0.4 MG TABS Take 1 tablet by mouth daily before breakfast. 90 tablet 3 06/02/2015 at Unknown time    Review of Systems  Constitutional: Negative for fever and malaise/fatigue.  Eyes: Positive for photophobia. Negative for blurred vision.  Gastrointestinal: Positive for constipation. Negative for nausea, vomiting, abdominal pain and diarrhea.  Genitourinary: Negative for dysuria, urgency and frequency.  Neurological: Positive for headaches.   Physical Exam   Blood pressure 126/61, pulse 75, temperature 98.4 F (36.9 C), temperature source Oral, resp. rate 20, height 5\' 6"  (1.676 m), weight 231 lb 8 oz (105.008 kg), last menstrual period 03/18/2015.  Physical Exam  Nursing note and vitals reviewed. Constitutional: She is  oriented to person, place, and time. She appears well-developed and well-nourished. No distress.  HENT:  Head: Normocephalic and atraumatic.  Cardiovascular: Normal rate.   Respiratory: Effort normal.  GI: Soft. She exhibits no distension and no mass. There is no tenderness. There is no rebound and no guarding.  Neurological: She is alert and oriented to person, place, and time.  Skin: Skin is warm and dry. No erythema.  Psychiatric: She has a normal mood and affect.   Results for orders placed or performed during the hospital encounter of 06/02/15 (from the past 24 hour(s))  Urinalysis, Routine w reflex microscopic (not at [facility])     Status:  Abnormal   Collection Time: 06/02/15  8:08 PM  Result Value Ref Range   Color, Urine YELLOW YELLOW   APPearance CLEAR CLEAR   Specific Gravity, Urine 1.020 1.005 - 1.030   pH 6.5 5.0 - 8.0   Glucose, UA NEGATIVE NEGATIVE mg/dL   Hgb urine dipstick NEGATIVE NEGATIVE   Bilirubin Urine NEGATIVE NEGATIVE   Ketones, ur 15 (A) NEGATIVE mg/dL   Protein, ur NEGATIVE NEGATIVE mg/dL   Nitrite NEGATIVE NEGATIVE   Leukocytes, UA NEGATIVE NEGATIVE    MAU Course  Procedures None  MDM UA today shows mild dehydration IV NS, Decadron, Reglan and Benadryl given for headache Patient reports resolution of headache at time of discharge  Assessment and Plan  A: [redacted]w[redacted]d Headache  P: Discharge home Continue Emily Saunders PRN for headache as previously prescribed Increased PO hydration advised  Patient advised to follow-up with [facility] as scheduled for routine prenatal care or sooner PRN Patient may return to MAU as needed or if her condition were to change or worsen   Emily Redden, PA-C  06/02/2015, 10:24 PM

## 2015-06-03 ENCOUNTER — Ambulatory Visit (HOSPITAL_COMMUNITY)
Admission: RE | Admit: 2015-06-03 | Discharge: 2015-06-03 | Disposition: A | Discharge status: Home / Self Care | Payer: Medicaid Other | Source: Ambulatory Visit | Attending: Obstetrics | Admitting: Obstetrics

## 2015-06-03 DIAGNOSIS — N8311 Corpus luteum cyst of right ovary: Secondary | ICD-10-CM | POA: Insufficient documentation

## 2015-06-03 DIAGNOSIS — Z3A10 10 weeks gestation of pregnancy: Secondary | ICD-10-CM | POA: Insufficient documentation

## 2015-06-03 DIAGNOSIS — O3481 Maternal care for other abnormalities of pelvic organs, first trimester: Secondary | ICD-10-CM | POA: Insufficient documentation

## 2015-06-03 DIAGNOSIS — O283 Abnormal ultrasonic finding on antenatal screening of mother: Secondary | ICD-10-CM | POA: Diagnosis not present

## 2015-06-03 DIAGNOSIS — O3680X1 Pregnancy with inconclusive fetal viability, fetus 1: Secondary | ICD-10-CM

## 2015-06-03 LAB — URINE CULTURE

## 2015-06-04 ENCOUNTER — Other Ambulatory Visit: Payer: Self-pay | Admitting: Obstetrics

## 2015-06-04 DIAGNOSIS — N390 Urinary tract infection, site not specified: Secondary | ICD-10-CM

## 2015-06-04 MED ORDER — AMOXICILLIN-POT CLAVULANATE 500-125 MG PO TABS: 1.0000 | ORAL_TABLET | Freq: Two times a day (BID) | ORAL | Status: DC

## 2015-06-15 ENCOUNTER — Encounter: Payer: Medicaid Other | Admitting: Obstetrics

## 2015-06-18 ENCOUNTER — Encounter: Payer: Self-pay | Admitting: Obstetrics

## 2015-06-18 ENCOUNTER — Ambulatory Visit (INDEPENDENT_AMBULATORY_CARE_PROVIDER_SITE_OTHER): Payer: Medicaid Other | Admitting: Obstetrics

## 2015-06-18 VITALS — BP 108/75 | HR 76 | Wt 230.0 lb

## 2015-06-18 DIAGNOSIS — Z3492 Encounter for supervision of normal pregnancy, unspecified, second trimester: Secondary | ICD-10-CM

## 2015-06-18 DIAGNOSIS — O219 Vomiting of pregnancy, unspecified: Secondary | ICD-10-CM | POA: Diagnosis not present

## 2015-06-18 DIAGNOSIS — G44019 Episodic cluster headache, not intractable: Secondary | ICD-10-CM | POA: Diagnosis not present

## 2015-06-18 DIAGNOSIS — O09522 Supervision of elderly multigravida, second trimester: Secondary | ICD-10-CM

## 2015-06-18 LAB — POCT URINALYSIS DIPSTICK
BILIRUBIN UA: NEGATIVE
GLUCOSE UA: NEGATIVE
KETONES UA: NEGATIVE
Leukocytes, UA: NEGATIVE
Nitrite, UA: NEGATIVE
Protein, UA: NEGATIVE
RBC UA: NEGATIVE
SPEC GRAV UA: 1.015
Urobilinogen, UA: NEGATIVE
pH, UA: 6

## 2015-06-18 MED ORDER — DOXYLAMINE SUCCINATE (SLEEP) 25 MG PO TABS: 25.0000 mg | ORAL_TABLET | Freq: Every day | ORAL | Status: DC

## 2015-06-18 MED ORDER — VITAMIN B-6 100 MG PO TABS: 100.0000 mg | ORAL_TABLET | Freq: Every day | ORAL | Status: DC

## 2015-06-18 MED ORDER — HYDROMORPHONE HCL 8 MG PO TABS: 8.0000 mg | ORAL_TABLET | Freq: Four times a day (QID) | ORAL | Status: DC | PRN

## 2015-06-18 NOTE — Progress Notes (Addendum)
Subjective:    Emily Saunders is being seen today for her first obstetrical visit.  This is not a planned pregnancy. She is at [redacted]w[redacted]d gestation. Her obstetrical history is significant for advanced maternal age and obesity. Relationship with FOB: significant other, not living together. Patient does intend to breast feed. Pregnancy history fully reviewed.  The information documented in the HPI was reviewed and verified.  Menstrual History: OB History    Gravida Para Term Preterm AB TAB SAB Ectopic Multiple Living   6 3 3  2  1 1  3        Patient's last menstrual period was 03/18/2015 (lmp unknown).    Past Medical History  Diagnosis Date  . Vitamin D deficiency 2015   . H/O pilonidal cyst 2002   . PID (acute pelvic inflammatory disease) 2006     Past Surgical History  Procedure Laterality Date  . No past surgeries    . Cesarean section    . Cyst excision Right 2003    hand     (Not in a hospital admission) No Known Allergies  Social History  Substance Use Topics  . Smoking status: Former Smoker -- 0.75 packs/day    Types: Cigarettes  . Smokeless tobacco: Never Used     Comment: Smoking .5 ppd  . Alcohol Use: No    Family History  Problem Relation Age of Onset  . Hypertension Mother   . Diabetes Mother   . Asthma Mother   . COPD Mother   . Depression Mother   . Miscarriages / [place] Mother   . Vision loss Mother   . Parkinson's disease Father   . Diabetes Maternal Aunt   . Diabetes Maternal Uncle   . Diabetes Cousin      Review of Systems Constitutional: negative for weight loss Gastrointestinal: negative for vomiting Genitourinary:negative for genital lesions and vaginal discharge and dysuria Musculoskeletal:negative for back pain Behavioral/Psych: negative for abusive relationship, depression, illegal drug usage and tobacco use    Objective:    BP 108/75 mmHg  Pulse 76  Wt 230 lb (104.327 kg)  LMP 03/18/2015 (LMP Unknown) General Appearance:     Alert, cooperative, no distress, appears stated age  Head:    Normocephalic, without obvious abnormality, atraumatic  Eyes:    PERRL, conjunctiva/corneas clear, EOM's intact, fundi    benign, both eyes  Ears:    Normal TM's and external ear canals, both ears  Nose:   Nares normal, septum midline, mucosa normal, no drainage    or sinus tenderness  Throat:   Lips, mucosa, and tongue normal; teeth and gums normal  Neck:   Supple, symmetrical, trachea midline, no adenopathy;    thyroid:  no enlargement/tenderness/nodules; no carotid   bruit or JVD  Back:     Symmetric, no curvature, ROM normal, no CVA tenderness  Lungs:     Clear to auscultation bilaterally, respirations unlabored  Chest Wall:    No tenderness or deformity   Heart:    Regular rate and rhythm, S1 and S2 normal, no murmur, rub   or gallop  Breast Exam:    No tenderness, masses, or nipple abnormality  Abdomen:     Soft, non-tender, bowel sounds active all four quadrants,    no masses, no organomegaly  Genitalia:    Normal female without lesion, discharge or tenderness  Extremities:   Extremities normal, atraumatic, no cyanosis or edema  Pulses:   2+ and symmetric all extremities  Skin:  Skin color, texture, turgor normal, no rashes or lesions  Lymph nodes:   Cervical, supraclavicular, and axillary nodes normal  Neurologic:   CNII-XII intact, normal strength, sensation and reflexes    throughout      Lab Review Urine pregnancy test Labs reviewed yes Radiologic studies reviewed yes  Assessment:    Pregnancy at [redacted]w[redacted]d weeks    AMA   Plan:     Referred to MFM for AMA Prenatal vitamins.  Counseling provided regarding continued use of seat belts, cessation of alcohol consumption, smoking or use of illicit drugs; infection precautions i.e., influenza/TDAP immunizations, toxoplasmosis,CMV, parvovirus, listeria and varicella; workplace safety, exercise during pregnancy; routine dental care, safe medications, sexual  activity, hot tubs, saunas, pools, travel, caffeine use, fish and methlymercury, potential toxins, hair treatments, varicose veins Weight gain recommendations per IOM guidelines reviewed: underweight/BMI< 18.5--> gain 28 - 40 lbs; normal weight/BMI 18.5 - 24.9--> gain 25 - 35 lbs; overweight/BMI 25 - 29.9--> gain 15 - 25 lbs; obese/BMI >30->gain  11 - 20 lbs Problem list reviewed and updated. FIRST/CF mutation testing/NIPT/QUAD SCREEN/fragile X/Ashkenazi Jewish population testing/Spinal muscular atrophy discussed: requested. Role of ultrasound in pregnancy discussed; fetal survey: requested. Amniocentesis discussed: undecided. VBAC calculator score: VBAC consent form provided Meds ordered this encounter  Medications  . DISCONTD: HYDROmorphone (DILAUDID) 8 MG tablet    Sig: Take 1 tablet (8 mg total) by mouth every 6 (six) hours as needed for moderate pain or severe pain.    Dispense:  40 tablet    Refill:  0  . DISCONTD: HYDROmorphone (DILAUDID) 8 MG tablet    Sig: Take 1 tablet (8 mg total) by mouth every 6 (six) hours as needed for moderate pain or severe pain.    Dispense:  40 tablet    Refill:  0  . pyridOXINE (VITAMIN B-6) 100 MG tablet    Sig: Take 1 tablet (100 mg total) by mouth at bedtime.    Dispense:  30 tablet    Refill:  5  . doxylamine, Sleep, (UNISOM) 25 MG tablet    Sig: Take 1 tablet (25 mg total) by mouth at bedtime.    Dispense:  30 tablet    Refill:  5   Orders Placed This Encounter  Procedures  . HIV antibody  . Hemoglobinopathy evaluation  . Varicella zoster antibody, IgG  . VITAMIN D 25 Hydroxy (Vit-D Deficiency, Fractures)  . Prenatal Profile I  . POCT urinalysis dipstick    Follow up in 4 weeks.

## 2015-06-18 NOTE — Progress Notes (Signed)
Pt states that she is nausea with no vomiting. Pt states that Fioricet has not been helping HA

## 2015-06-19 ENCOUNTER — Encounter (HOSPITAL_COMMUNITY): Payer: Self-pay

## 2015-06-22 ENCOUNTER — Ambulatory Visit (HOSPITAL_COMMUNITY): Admission: RE | Admit: 2015-06-22 | Payer: Medicaid Other | Source: Ambulatory Visit

## 2015-06-22 ENCOUNTER — Ambulatory Visit (HOSPITAL_COMMUNITY)
Admission: RE | Admit: 2015-06-22 | Discharge: 2015-06-22 | Disposition: A | Discharge status: Home / Self Care | Payer: Medicaid Other | Source: Ambulatory Visit | Attending: Obstetrics | Admitting: Obstetrics

## 2015-06-22 ENCOUNTER — Other Ambulatory Visit: Payer: Self-pay | Admitting: Obstetrics

## 2015-06-22 ENCOUNTER — Encounter (HOSPITAL_COMMUNITY): Payer: Self-pay

## 2015-06-22 DIAGNOSIS — O09522 Supervision of elderly multigravida, second trimester: Secondary | ICD-10-CM

## 2015-06-22 DIAGNOSIS — Z36 Encounter for antenatal screening of mother: Secondary | ICD-10-CM | POA: Insufficient documentation

## 2015-06-22 DIAGNOSIS — O99211 Obesity complicating pregnancy, first trimester: Secondary | ICD-10-CM | POA: Insufficient documentation

## 2015-06-22 DIAGNOSIS — Z369 Encounter for antenatal screening, unspecified: Secondary | ICD-10-CM

## 2015-06-22 DIAGNOSIS — Z3A12 12 weeks gestation of pregnancy: Secondary | ICD-10-CM

## 2015-06-22 DIAGNOSIS — Z3A13 13 weeks gestation of pregnancy: Secondary | ICD-10-CM | POA: Insufficient documentation

## 2015-06-22 DIAGNOSIS — O09521 Supervision of elderly multigravida, first trimester: Secondary | ICD-10-CM | POA: Diagnosis not present

## 2015-06-22 LAB — HEMOGLOBINOPATHY EVALUATION
HEMOGLOBIN A2 QUANTITATION: 2.5 % (ref 0.7–3.1)
HGB A: 97.5 % (ref 94.0–98.0)
HGB C: 0 %
HGB S: 0 %
Hemoglobin F Quantitation: 0 % (ref 0.0–2.0)

## 2015-06-22 LAB — PAP IG AND HPV HIGH-RISK
HPV, HIGH-RISK: NEGATIVE
PAP Smear Comment: 0

## 2015-06-22 LAB — PRENATAL PROFILE I(LABCORP)
Antibody Screen: NEGATIVE
BASOS ABS: 0 10*3/uL (ref 0.0–0.2)
Basos: 0 %
EOS (ABSOLUTE): 0.2 10*3/uL (ref 0.0–0.4)
EOS: 3 %
HEMOGLOBIN: 11.1 g/dL (ref 11.1–15.9)
Hematocrit: 33.5 % — ABNORMAL LOW (ref 34.0–46.6)
Hepatitis B Surface Ag: NEGATIVE
IMMATURE GRANULOCYTES: 0 %
Immature Grans (Abs): 0 10*3/uL (ref 0.0–0.1)
LYMPHS ABS: 1.9 10*3/uL (ref 0.7–3.1)
LYMPHS: 24 %
MCH: 29.9 pg (ref 26.6–33.0)
MCHC: 33.1 g/dL (ref 31.5–35.7)
MCV: 90 fL (ref 79–97)
MONOS ABS: 0.5 10*3/uL (ref 0.1–0.9)
Monocytes: 6 %
NEUTROS PCT: 67 %
Neutrophils Absolute: 5.2 10*3/uL (ref 1.4–7.0)
PLATELETS: 215 10*3/uL (ref 150–379)
RBC: 3.71 x10E6/uL — AB (ref 3.77–5.28)
RDW: 14 % (ref 12.3–15.4)
RH TYPE: POSITIVE
RPR Ser Ql: NONREACTIVE
Rubella Antibodies, IGG: 2.13 index (ref >0.99)
WBC: 7.8 10*3/uL (ref 3.4–10.8)

## 2015-06-22 LAB — VARICELLA ZOSTER ANTIBODY, IGG: Varicella zoster IgG: >4000 index (ref >165)

## 2015-06-22 LAB — HIV ANTIBODY (ROUTINE TESTING W REFLEX): HIV Screen 4th Generation wRfx: NONREACTIVE

## 2015-06-22 LAB — VITAMIN D 25 HYDROXY (VIT D DEFICIENCY, FRACTURES): Vit D, 25-Hydroxy: 27.9 ng/mL — ABNORMAL LOW (ref 30.0–100.0)

## 2015-06-23 LAB — NUSWAB VG+, CANDIDA 6SP
ATOPOBIUM VAGINAE: HIGH {score} — AB
BVAB 2: HIGH Score — AB
CANDIDA ALBICANS, NAA: NEGATIVE
CANDIDA GLABRATA, NAA: NEGATIVE
CANDIDA KRUSEI, NAA: NEGATIVE
CANDIDA LUSITANIAE, NAA: NEGATIVE
CANDIDA PARAPSILOSIS, NAA: NEGATIVE
CANDIDA TROPICALIS, NAA: NEGATIVE
CHLAMYDIA TRACHOMATIS, NAA: NEGATIVE
Neisseria gonorrhoeae, NAA: NEGATIVE
Trich vag by NAA: NEGATIVE

## 2015-06-24 ENCOUNTER — Ambulatory Visit (INDEPENDENT_AMBULATORY_CARE_PROVIDER_SITE_OTHER): Payer: Medicaid Other | Admitting: Obstetrics

## 2015-06-24 ENCOUNTER — Encounter: Payer: Self-pay | Admitting: Obstetrics

## 2015-06-24 ENCOUNTER — Other Ambulatory Visit: Payer: Self-pay | Admitting: Obstetrics

## 2015-06-24 VITALS — BP 126/82 | HR 80 | Wt 229.0 lb

## 2015-06-24 DIAGNOSIS — K219 Gastro-esophageal reflux disease without esophagitis: Secondary | ICD-10-CM

## 2015-06-24 DIAGNOSIS — Z3482 Encounter for supervision of other normal pregnancy, second trimester: Secondary | ICD-10-CM

## 2015-06-24 DIAGNOSIS — N760 Acute vaginitis: Principal | ICD-10-CM

## 2015-06-24 DIAGNOSIS — B9689 Other specified bacterial agents as the cause of diseases classified elsewhere: Secondary | ICD-10-CM

## 2015-06-24 DIAGNOSIS — R350 Frequency of micturition: Secondary | ICD-10-CM

## 2015-06-24 LAB — POCT URINALYSIS DIPSTICK
Bilirubin, UA: NEGATIVE
Blood, UA: NEGATIVE
Glucose, UA: NEGATIVE
Ketones, UA: NEGATIVE
Leukocytes, UA: NEGATIVE
Nitrite, UA: NEGATIVE
PH UA: 6
PROTEIN UA: NEGATIVE
SPEC GRAV UA: 1.025
UROBILINOGEN UA: NEGATIVE

## 2015-06-24 MED ORDER — CLINDAMYCIN HCL 300 MG PO CAPS: 300.0000 mg | ORAL_CAPSULE | Freq: Three times a day (TID) | ORAL | Status: DC

## 2015-06-24 MED ORDER — OMEPRAZOLE 20 MG PO CPDR: 20.0000 mg | DELAYED_RELEASE_CAPSULE | Freq: Two times a day (BID) | ORAL | Status: DC

## 2015-06-25 ENCOUNTER — Encounter: Payer: Self-pay | Admitting: Obstetrics

## 2015-06-25 NOTE — Progress Notes (Addendum)
  Subjective:    Emily Saunders is a 40 y.o. female being seen today for her obstetrical visit. She is at [redacted]w[redacted]d gestation. Patient reports: urinary frequency.  Problem List Items Addressed This Visit    None    Visit Diagnoses    Encounter for supervision of other normal pregnancy in second trimester    -  Primary    Relevant Orders    POCT urinalysis dipstick (Completed)    GERD without esophagitis        Relevant Medications    omeprazole (PRILOSEC) 20 MG capsule    Urinary frequency        Relevant Orders    Urine Culture      Patient Active Problem List   Diagnosis Date Noted  . HTN (hypertension) 02/27/2015  . Unintended weight loss 10/27/2014  . Allergic rhinitis 06/23/2014  . Elevated BP 06/23/2014  . 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 Vitamin D insufficiency 03/03/2014  . Pilonidal cyst 03/03/2014  . Broken teeth 03/03/2014  . Poor sleep pattern 03/03/2014  . Obesity (BMI 30-39.9) 03/03/2014  . Current smoker 03/03/2014  . Anemia 05/27/2013    Objective:     BP 126/82 mmHg  Pulse 80  Wt 229 lb (103.874 kg)  LMP 03/18/2015 (LMP Unknown) Uterine Size: Below umbilicus     Assessment:    Pregnancy @ [redacted]w[redacted]d  weeks Doing well    Plan:    Problem list reviewed and updated. Labs reviewed.  Follow up in 4 weeks. FIRST/CF mutation testing/NIPT/QUAD SCREEN/fragile X/Ashkenazi Jewish population testing/Spinal muscular atrophy discussed: requested. Role of ultrasound in pregnancy discussed; fetal survey: requested. Amniocentesis discussed: undecided.

## 2015-06-29 ENCOUNTER — Other Ambulatory Visit: Payer: Self-pay | Admitting: Obstetrics

## 2015-06-29 DIAGNOSIS — N390 Urinary tract infection, site not specified: Secondary | ICD-10-CM

## 2015-06-29 LAB — URINE CULTURE

## 2015-06-29 MED ORDER — CEFUROXIME AXETIL 500 MG PO TABS: 500.0000 mg | ORAL_TABLET | Freq: Two times a day (BID) | ORAL | Status: DC

## 2015-06-30 ENCOUNTER — Telehealth (HOSPITAL_COMMUNITY): Payer: Self-pay | Admitting: MS"

## 2015-06-30 ENCOUNTER — Other Ambulatory Visit (HOSPITAL_COMMUNITY): Payer: Self-pay

## 2015-06-30 NOTE — Telephone Encounter (Signed)
Called [name] to discuss her prenatal cell free DNA test results.  [name] had Panor[facility] testing through [facility].  Testing was offered because of maternal age.   The patient was identified by name and DOB.  We reviewed that these are within normal limits, showing a less than 1 in 10,000 risk for trisomies 21, 18 and 13, and monosomy X (Turner syndrome).  In addition, the risk for triploidy and sex chromosome trisomies (47,XXX and 47,XXY) was also low risk.  We reviewed that this testing identifies > 99% of pregnancies with trisomy 24, trisomy 51, sex chromosome trisomies (47,XXX and 47,XXY), and triploidy. The detection rate for trisomy 18 is 96%.  The detection rate for monosomy X is ~92%.  The false positive rate is <0.1% for all conditions. The patient did not wish to know fetal sex at this time. She requested for a copy of the report to be mailed to her.  She understands that this testing does not identify all genetic conditions.  All questions were answered to her satisfaction, she was encouraged to call with additional questions or concerns.  Chipper Oman, MS Certified Genetic Counselor 06/30/2015 10:48 AM

## 2015-07-01 ENCOUNTER — Encounter (HOSPITAL_COMMUNITY): Payer: Self-pay | Admitting: *Deleted

## 2015-07-01 ENCOUNTER — Inpatient Hospital Stay (HOSPITAL_COMMUNITY)
Admission: AD | Admit: 2015-07-01 | Discharge: 2015-07-01 | Disposition: A | Discharge status: Home / Self Care | Payer: Medicaid Other | Source: Ambulatory Visit | Attending: Obstetrics | Admitting: Obstetrics

## 2015-07-01 DIAGNOSIS — B9689 Other specified bacterial agents as the cause of diseases classified elsewhere: Secondary | ICD-10-CM | POA: Diagnosis not present

## 2015-07-01 DIAGNOSIS — Z3A15 15 weeks gestation of pregnancy: Secondary | ICD-10-CM | POA: Diagnosis not present

## 2015-07-01 DIAGNOSIS — J019 Acute sinusitis, unspecified: Secondary | ICD-10-CM

## 2015-07-01 DIAGNOSIS — R0981 Nasal congestion: Secondary | ICD-10-CM | POA: Diagnosis present

## 2015-07-01 DIAGNOSIS — O99512 Diseases of the respiratory system complicating pregnancy, second trimester: Secondary | ICD-10-CM | POA: Insufficient documentation

## 2015-07-01 DIAGNOSIS — Z87891 Personal history of nicotine dependence: Secondary | ICD-10-CM | POA: Insufficient documentation

## 2015-07-01 DIAGNOSIS — J0191 Acute recurrent sinusitis, unspecified: Secondary | ICD-10-CM | POA: Insufficient documentation

## 2015-07-01 MED ORDER — AMOXICILLIN-POT CLAVULANATE 875-125 MG PO TABS
1.0000 | ORAL_TABLET | Freq: Two times a day (BID) | ORAL | Status: DC
Start: 2015-07-01 — End: 2015-08-05

## 2015-07-01 NOTE — MAU Provider Note (Signed)
History     CSN: [id number]  Arrival date and time: 07/01/15 2031   First Provider Initiated Contact with Patient 07/01/15 2122      Chief Complaint  Patient presents with  . Nasal Congestion  . Sore Throat   HPI [name] is a 40 y.o. [id number] at [redacted]w[redacted]d who presents to MAU today with complaint of nasal congestion x 2 weeks. She states that she thought it was allergies and has been taking Claritan without relief. She states occasional sore throat, but denies cough, chest pain, ear pain, abdominal pain, vaginal bleeding or fever. She has had a lot of sinus pressure.   OB History    Gravida Para Term Preterm AB TAB SAB Ectopic Multiple Living   6 3 3  2  1 1  3       Past Medical History  Diagnosis Date  . Vitamin D deficiency 2015   . H/O pilonidal cyst 2002   . PID (acute pelvic inflammatory disease) 2006     Past Surgical History  Procedure Laterality Date  . Cesarean section    . Cyst excision Right 2003    hand    Family History  Problem Relation Age of Onset  . Hypertension Mother   . Diabetes Mother   . Asthma Mother   . COPD Mother   . Depression Mother   . Miscarriages / [place] Mother   . Vision loss Mother   . Parkinson's disease Father   . Diabetes Maternal Aunt   . Diabetes Maternal Uncle   . Diabetes Cousin     Social History  Substance Use Topics  . Smoking status: Former Smoker -- 0.75 packs/day    Types: Cigarettes  . Smokeless tobacco: Never Used     Comment: Smoking .5 ppd  . Alcohol Use: No    Allergies: No Known Allergies  Prescriptions prior to admission  Medication Sig Dispense Refill Last Dose  . amoxicillin-clavulanate (AUGMENTIN) 500-125 MG tablet Take 1 tablet (500 mg total) by mouth 2 (two) times daily. 1 tablet po BID for 7 days. (Patient not taking: Reported on 06/18/2015) 14 tablet 0 Not Taking  . butalbital-acetaminophen-caffeine (FIORICET) 50-325-40 MG tablet Take 2 tablets by mouth every 6 (six) hours as  needed for headache. 40 tablet 2 Taking  . calcium carbonate (TUMS - DOSED IN MG ELEMENTAL CALCIUM) 500 MG chewable tablet Chew 2 tablets by mouth 2 (two) times daily as needed for indigestion or heartburn. Reported on 06/18/2015   Taking  . cefUROXime (CEFTIN) 500 MG tablet Take 1 tablet (500 mg total) by mouth 2 (two) times daily with a meal. 14 tablet 0   . clindamycin (CLEOCIN) 300 MG capsule Take 1 capsule (300 mg total) by mouth 3 (three) times daily. 21 capsule 0   . doxylamine, Sleep, (UNISOM) 25 MG tablet Take 1 tablet (25 mg total) by mouth at bedtime. 30 tablet 5 Taking  . labetalol (NORMODYNE) 200 MG tablet Take 1 tablet (200 mg total) by mouth 3 (three) times daily. 90 tablet 3 Taking  . loratadine (CLARITIN) 10 MG tablet Take 1 tablet (10 mg total) by mouth daily. 30 tablet 11 Taking  . omeprazole (PRILOSEC) 20 MG capsule Take 1 capsule (20 mg total) by mouth 2 (two) times daily before a meal. 60 capsule 5   . Prenatal-Fe Fum-Methf-FA w/o A (VITAFOL-NANO) 18-0.6-0.4 MG TABS Take 1 tablet by mouth daily before breakfast. 90 tablet 3 Taking  . pyridOXINE (VITAMIN B-6) 100 MG  tablet Take 1 tablet (100 mg total) by mouth at bedtime. 30 tablet 5 Taking    Review of Systems  Constitutional: Negative for fever and malaise/fatigue.  HENT: Positive for congestion and sore throat. Negative for ear pain and nosebleeds.   Respiratory: Negative for cough, sputum production and shortness of breath.   Cardiovascular: Negative for chest pain.  Gastrointestinal: Negative for abdominal pain.  Genitourinary:       Neg - vaginal bleeding  Neurological: Positive for headaches.   Physical Exam   Blood pressure 132/89, pulse 87, temperature 98.3 F (36.8 C), temperature source Oral, resp. rate 18, last menstrual period 03/18/2015.  Physical Exam  Nursing note and vitals reviewed. Constitutional: She is oriented to person, place, and time. She appears well-developed and well-nourished. No distress.   HENT:  Head: Normocephalic and atraumatic.  Right Ear: External ear normal.  Left Ear: External ear normal.  Nose: Mucosal edema and rhinorrhea present. Right sinus exhibits maxillary sinus tenderness and frontal sinus tenderness. Left sinus exhibits maxillary sinus tenderness and frontal sinus tenderness.  Mouth/Throat: Oropharynx is clear and moist. No oropharyngeal exudate.  Eyes: EOM are normal.  Neck: Normal range of motion. Neck supple.  Cardiovascular: Normal rate, regular rhythm and normal heart sounds.   Respiratory: Effort normal and breath sounds normal. No respiratory distress. She has no wheezes.  GI: Soft. She exhibits no distension.  Lymphadenopathy:       Head (right side): No submental, no submandibular and no tonsillar adenopathy present.       Head (left side): No submental, no submandibular and no tonsillar adenopathy present.    She has no cervical adenopathy.  Neurological: She is alert and oriented to person, place, and time.  Skin: Skin is warm and dry. No erythema.  Psychiatric: She has a normal mood and affect.    MAU Course  Procedures None  MDM O2 sat > 98%  Due to significant sinus tenderness to palpation and symptoms > 2 weeks will treat for presumed bacterial sinusitis Assessment and Plan  A: Acute bacterial rhinosinusitis  P: Discharge home Rx for Augmentin given to patient  Warning signs for worsening condition discussed List of OTC medications for symptomatic relief given  Patient advised to follow-up with [facility] as scheduled for routine prenatal care or sooner PRN Patient may return to MAU as needed or if her condition were to change or worsen  Luvenia Redden, PA-C  07/01/2015, 9:22 PM

## 2015-07-01 NOTE — Discharge Instructions (Signed)
Sinusitis, Adult  Sinusitis is redness, soreness, and puffiness (inflammation) of the air pockets in the bones of your face (sinuses). The redness, soreness, and puffiness can cause air and mucus to get trapped in your sinuses. This can allow germs to grow and cause an infection.   HOME CARE    Drink enough fluids to keep your pee (urine) clear or pale yellow.   Use a humidifier in your home.   Run a hot shower to create steam in the bathroom. Sit in the bathroom with the door closed. Breathe in the steam 3-4 times a day.   Put a warm, moist washcloth on your face 3-4 times a day, or as told by your doctor.   Use salt water sprays (saline sprays) to wet the thick fluid in your nose. This can help the sinuses drain.   Only take medicine as told by your doctor.  GET HELP RIGHT AWAY IF:    Your pain gets worse.   You have very bad headaches.   You are sick to your stomach (nauseous).   You throw up (vomit).   You are very sleepy (drowsy) all the time.   Your face is puffy (swollen).   Your vision changes.   You have a stiff neck.   You have trouble breathing.  MAKE SURE YOU:    Understand these instructions.   Will watch your condition.   Will get help right away if you are not doing well or get worse.     This information is not intended to replace advice given to you by your health care provider. Make sure you discuss any questions you have with your health care provider.     Document Released: 07/13/2007 Document Revised: 02/14/2014 Document Reviewed: 08/30/2011  Elsevier Interactive Patient Education 2016 Elsevier Inc.

## 2015-07-01 NOTE — MAU Note (Signed)
Pt presents complaining of not being able to breathe out of her nose. Taking allergy medicine but not helping. Has not tried anything else. Denies vaginal bleeding or leaking. Denies pain.

## 2015-07-08 ENCOUNTER — Encounter: Payer: Self-pay | Admitting: Obstetrics

## 2015-07-08 ENCOUNTER — Ambulatory Visit (INDEPENDENT_AMBULATORY_CARE_PROVIDER_SITE_OTHER): Payer: Medicaid Other | Admitting: Obstetrics

## 2015-07-08 VITALS — BP 122/78 | HR 75 | Wt 228.0 lb

## 2015-07-08 DIAGNOSIS — Z3492 Encounter for supervision of normal pregnancy, unspecified, second trimester: Secondary | ICD-10-CM

## 2015-07-08 DIAGNOSIS — O09521 Supervision of elderly multigravida, first trimester: Secondary | ICD-10-CM

## 2015-07-08 LAB — POCT URINALYSIS DIPSTICK
Bilirubin, UA: NEGATIVE
Blood, UA: NEGATIVE
GLUCOSE UA: NEGATIVE
Ketones, UA: NEGATIVE
LEUKOCYTES UA: NEGATIVE
Nitrite, UA: NEGATIVE
PROTEIN UA: NEGATIVE
Spec Grav, UA: 1.025
UROBILINOGEN UA: NEGATIVE
pH, UA: 5

## 2015-07-08 NOTE — Progress Notes (Signed)
  Subjective:    Emily Saunders is a 40 y.o. female being seen today for her obstetrical visit. She is at [redacted]w[redacted]d gestation. Patient reports: no complaints.  Problem List Items Addressed This Visit    None    Visit Diagnoses    Prenatal care, second trimester    -  Primary    Relevant Orders    POCT urinalysis dipstick (Completed)      Patient Active Problem List   Diagnosis Date Noted  . HTN (hypertension) 02/27/2015  . Unintended weight loss 10/27/2014  . Allergic rhinitis 06/23/2014  . Elevated BP 06/23/2014  . 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 Vitamin D insufficiency 03/03/2014  . Pilonidal cyst 03/03/2014  . Broken teeth 03/03/2014  . Poor sleep pattern 03/03/2014  . Obesity (BMI 30-39.9) 03/03/2014  . Current smoker 03/03/2014  . Anemia 05/27/2013    Objective:     BP 122/78 mmHg  Pulse 75  Wt 228 lb (103.42 kg)  LMP 03/18/2015 (LMP Unknown) Uterine Size: Below umbilicus     Assessment:    Pregnancy @ [redacted]w[redacted]d  weeks Doing well    Plan:    Problem list reviewed and updated. Labs reviewed.  Follow up in 4 weeks. FIRST/CF mutation testing/NIPT/QUAD SCREEN/fragile X/Ashkenazi Jewish population testing/Spinal muscular atrophy discussed: requested. Role of ultrasound in pregnancy discussed; fetal survey: requested. Amniocentesis discussed: declined.

## 2015-07-27 ENCOUNTER — Other Ambulatory Visit (HOSPITAL_COMMUNITY): Payer: Self-pay | Admitting: Maternal and Fetal Medicine

## 2015-07-27 ENCOUNTER — Ambulatory Visit (HOSPITAL_COMMUNITY)
Admission: RE | Admit: 2015-07-27 | Discharge: 2015-07-27 | Disposition: A | Discharge status: Home / Self Care | Payer: Medicaid Other | Source: Ambulatory Visit | Attending: Obstetrics | Admitting: Obstetrics

## 2015-07-27 ENCOUNTER — Encounter (HOSPITAL_COMMUNITY): Payer: Self-pay

## 2015-07-27 DIAGNOSIS — O99212 Obesity complicating pregnancy, second trimester: Secondary | ICD-10-CM

## 2015-07-27 DIAGNOSIS — O10012 Pre-existing essential hypertension complicating pregnancy, second trimester: Secondary | ICD-10-CM | POA: Insufficient documentation

## 2015-07-27 DIAGNOSIS — O09522 Supervision of elderly multigravida, second trimester: Secondary | ICD-10-CM | POA: Insufficient documentation

## 2015-07-27 DIAGNOSIS — Z3A18 18 weeks gestation of pregnancy: Secondary | ICD-10-CM | POA: Insufficient documentation

## 2015-07-27 DIAGNOSIS — O162 Unspecified maternal hypertension, second trimester: Secondary | ICD-10-CM

## 2015-07-27 DIAGNOSIS — O34219 Maternal care for unspecified type scar from previous cesarean delivery: Secondary | ICD-10-CM | POA: Diagnosis not present

## 2015-08-05 ENCOUNTER — Ambulatory Visit (INDEPENDENT_AMBULATORY_CARE_PROVIDER_SITE_OTHER): Payer: Medicaid Other | Admitting: Obstetrics

## 2015-08-05 ENCOUNTER — Encounter: Payer: Self-pay | Admitting: Obstetrics

## 2015-08-05 VITALS — BP 114/75 | HR 74 | Wt 233.0 lb

## 2015-08-05 DIAGNOSIS — Z3492 Encounter for supervision of normal pregnancy, unspecified, second trimester: Secondary | ICD-10-CM

## 2015-08-05 LAB — POCT URINALYSIS DIPSTICK
Bilirubin, UA: NEGATIVE
Blood, UA: NEGATIVE
Glucose, UA: NEGATIVE
KETONES UA: NEGATIVE
LEUKOCYTES UA: NEGATIVE
Nitrite, UA: NEGATIVE
PH UA: 7
PROTEIN UA: NEGATIVE
SPEC GRAV UA: 1.01
UROBILINOGEN UA: NEGATIVE

## 2015-08-05 NOTE — Progress Notes (Signed)
Subjective:    Emily Saunders is a 40 y.o. female being seen today for her obstetrical visit. She is at [redacted]w[redacted]d gestation. Patient reports: heartburn . Fetal movement: normal.  Problem List Items Addressed This Visit    None    Visit Diagnoses    Prenatal care, second trimester    -  Primary    Relevant Orders    POCT urinalysis dipstick (Completed)      Patient Active Problem List   Diagnosis Date Noted  . HTN (hypertension) 02/27/2015  . Unintended weight loss 10/27/2014  . Allergic rhinitis 06/23/2014  . Elevated BP 06/23/2014  . 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 Vitamin D insufficiency 03/03/2014  . Pilonidal cyst 03/03/2014  . Broken teeth 03/03/2014  . Poor sleep pattern 03/03/2014  . Obesity (BMI 30-39.9) 03/03/2014  . Current smoker 03/03/2014  . Anemia 05/27/2013   Objective:    BP 114/75 mmHg  Pulse 74  Wt 233 lb (105.688 kg)  LMP 03/18/2015 (LMP Unknown) FHT: 150 BPM  Uterine Size: size equals dates     Assessment:    Pregnancy @ [redacted]w[redacted]d    Plan:    OBGCT: discussed. Signs and symptoms of preterm labor: discussed.  Labs, problem list reviewed and updated 2 hr GTT planned Follow up in 4 weeks.

## 2015-09-02 ENCOUNTER — Ambulatory Visit (INDEPENDENT_AMBULATORY_CARE_PROVIDER_SITE_OTHER): Payer: Medicaid Other | Admitting: Obstetrics

## 2015-09-02 VITALS — BP 114/72 | HR 84 | Wt 236.0 lb

## 2015-09-02 DIAGNOSIS — Z3482 Encounter for supervision of other normal pregnancy, second trimester: Secondary | ICD-10-CM

## 2015-09-02 DIAGNOSIS — O09892 Supervision of other high risk pregnancies, second trimester: Secondary | ICD-10-CM

## 2015-09-02 LAB — POCT URINALYSIS DIPSTICK
Bilirubin, UA: NEGATIVE
GLUCOSE UA: NEGATIVE
Ketones, UA: NEGATIVE
LEUKOCYTES UA: NEGATIVE
Nitrite, UA: NEGATIVE
PH UA: 6.5
PROTEIN UA: NEGATIVE
RBC UA: NEGATIVE
SPEC GRAV UA: 1.015
UROBILINOGEN UA: NEGATIVE

## 2015-09-04 ENCOUNTER — Encounter (HOSPITAL_COMMUNITY): Payer: Self-pay

## 2015-09-07 ENCOUNTER — Ambulatory Visit (HOSPITAL_COMMUNITY)
Admission: RE | Admit: 2015-09-07 | Discharge: 2015-09-07 | Disposition: A | Discharge status: Home / Self Care | Payer: Medicaid Other | Source: Ambulatory Visit | Attending: Obstetrics | Admitting: Obstetrics

## 2015-09-07 ENCOUNTER — Other Ambulatory Visit (HOSPITAL_COMMUNITY): Payer: Self-pay | Admitting: Maternal and Fetal Medicine

## 2015-09-07 ENCOUNTER — Encounter (HOSPITAL_COMMUNITY): Payer: Self-pay

## 2015-09-07 DIAGNOSIS — Z3A24 24 weeks gestation of pregnancy: Secondary | ICD-10-CM | POA: Insufficient documentation

## 2015-09-07 DIAGNOSIS — O09522 Supervision of elderly multigravida, second trimester: Secondary | ICD-10-CM | POA: Diagnosis present

## 2015-09-07 DIAGNOSIS — O09523 Supervision of elderly multigravida, third trimester: Secondary | ICD-10-CM

## 2015-09-07 DIAGNOSIS — O99213 Obesity complicating pregnancy, third trimester: Secondary | ICD-10-CM

## 2015-09-07 DIAGNOSIS — O10919 Unspecified pre-existing hypertension complicating pregnancy, unspecified trimester: Secondary | ICD-10-CM

## 2015-09-07 DIAGNOSIS — O162 Unspecified maternal hypertension, second trimester: Secondary | ICD-10-CM

## 2015-09-07 DIAGNOSIS — O99212 Obesity complicating pregnancy, second trimester: Secondary | ICD-10-CM | POA: Insufficient documentation

## 2015-09-07 DIAGNOSIS — O34219 Maternal care for unspecified type scar from previous cesarean delivery: Secondary | ICD-10-CM | POA: Insufficient documentation

## 2015-09-07 DIAGNOSIS — Z3A28 28 weeks gestation of pregnancy: Secondary | ICD-10-CM

## 2015-09-07 DIAGNOSIS — O10012 Pre-existing essential hypertension complicating pregnancy, second trimester: Secondary | ICD-10-CM | POA: Diagnosis not present

## 2015-09-07 NOTE — Progress Notes (Signed)
Patient ID: Emily Saunders, female   DOB: 08-11-1975, 40 y.o.   MRN: [id number] Subjective:    Emily Saunders is a 40 y.o. female being seen today for her obstetrical visit. She is at [redacted]w[redacted]d gestation. Patient reports: no complaints . Fetal movement: normal.  Problem List Items Addressed This Visit    None    Visit Diagnoses    Encounter for supervision of other normal pregnancy in second trimester    -  Primary   Relevant Orders   POCT urinalysis dipstick (Completed)     Patient Active Problem List   Diagnosis Date Noted  . HTN (hypertension) 02/27/2015  . Unintended weight loss 10/27/2014  . Allergic rhinitis 06/23/2014  . Elevated BP 06/23/2014  . 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 Vitamin D insufficiency 03/03/2014  . Pilonidal cyst 03/03/2014  . Broken teeth 03/03/2014  . Poor sleep pattern 03/03/2014  . Obesity (BMI 30-39.9) 03/03/2014  . Current smoker 03/03/2014  . Anemia 05/27/2013   Objective:    BP 114/72   Pulse 84   Wt 236 lb (107 kg)   LMP 03/18/2015 (LMP Unknown)   BMI 38.09 kg/m  FHT: 150 BPM  Uterine Size: size equals dates     Assessment:    Pregnancy @ [redacted]w[redacted]d    Plan:    OBGCT: ordered for next visit. Signs and symptoms of preterm labor: discussed. VBAC: discussed and planned.  Labs, problem list reviewed and updated 2 hr GTT planned Follow up in 4 weeks.

## 2015-09-08 ENCOUNTER — Inpatient Hospital Stay (HOSPITAL_COMMUNITY)
Admission: AD | Admit: 2015-09-08 | Discharge: 2015-09-08 | Disposition: A | Discharge status: Home / Self Care | Payer: Medicaid Other | Source: Ambulatory Visit | Attending: Obstetrics & Gynecology | Admitting: Obstetrics & Gynecology

## 2015-09-08 ENCOUNTER — Encounter (HOSPITAL_COMMUNITY): Payer: Self-pay | Admitting: *Deleted

## 2015-09-08 DIAGNOSIS — J029 Acute pharyngitis, unspecified: Secondary | ICD-10-CM | POA: Insufficient documentation

## 2015-09-08 DIAGNOSIS — O26892 Other specified pregnancy related conditions, second trimester: Secondary | ICD-10-CM | POA: Diagnosis not present

## 2015-09-08 DIAGNOSIS — Z3A24 24 weeks gestation of pregnancy: Secondary | ICD-10-CM | POA: Insufficient documentation

## 2015-09-08 LAB — RAPID STREP SCREEN (MED CTR MEBANE ONLY): Streptococcus, Group A Screen (Direct): NEGATIVE

## 2015-09-08 NOTE — MAU Note (Signed)
Pt states she woke up Sunday with a sore throat, now feels like her tonsil are swollen. Had a cough yesterday, none today.  Denies abd pain, bleeding, or LOF.

## 2015-09-08 NOTE — Discharge Instructions (Signed)
Strep Throat °Strep throat is an infection of the throat. It is caused by germs. Strep throat spreads from person to person because of coughing, sneezing, or close contact. °HOME CARE °Medicines  °· Take over-the-counter and prescription medicines only as told by your doctor. °· Take your antibiotic medicine as told by your doctor. Do not stop taking the medicine even if you feel better. °· Have family members who also have a sore throat or fever go to a doctor. °Eating and Drinking  °· Do not share food, drinking cups, or personal items. °· Try eating soft foods until your sore throat feels better. °· Drink enough fluid to keep your pee (urine) clear or pale yellow. °General Instructions °· Rinse your mouth (gargle) with a salt-water mixture 3-4 times per day or as needed. To make a salt-water mixture, stir ½-1 tsp of salt into 1 cup of warm water. °· Make sure that all people in your house wash their hands well. °· Rest. °· Stay home from school or work until you have been taking antibiotics for 24 hours. °· Keep all follow-up visits as told by your doctor. This is important. °GET HELP IF: °· Your neck keeps getting bigger. °· You get a rash, cough, or earache. °· You cough up thick liquid that is green, yellow-brown, or bloody. °· You have pain that does not get better with medicine. °· Your problems get worse instead of getting better. °· You have a fever. °GET HELP RIGHT AWAY IF: °· You throw up (vomit). °· You get a very bad headache. °· You neck hurts or it feels stiff. °· You have chest pain or you are short of breath. °· You have drooling, very bad throat pain, or changes in your voice. °· Your neck is swollen or the skin gets red and tender. °· Your mouth is dry or you are peeing less than normal. °· You keep feeling more tired or it is hard to wake up. °· Your joints are red or they hurt. °  °This information is not intended to replace advice given to you by your health care provider. Make sure you  discuss any questions you have with your health care provider. °  °Document Released: 07/13/2007 Document Revised: 10/15/2014 Document Reviewed: 05/19/2014 °Elsevier Interactive Patient Education ©2016 Elsevier Inc. ° °

## 2015-09-10 LAB — CULTURE, GROUP A STREP (THRC)

## 2015-10-01 ENCOUNTER — Other Ambulatory Visit: Payer: Medicaid Other

## 2015-10-01 ENCOUNTER — Encounter: Payer: Self-pay | Admitting: Obstetrics

## 2015-10-01 ENCOUNTER — Ambulatory Visit (INDEPENDENT_AMBULATORY_CARE_PROVIDER_SITE_OTHER): Payer: Medicaid Other | Admitting: Obstetrics

## 2015-10-01 VITALS — BP 125/77 | HR 87 | Temp 98.0°F | Wt 238.1 lb

## 2015-10-01 DIAGNOSIS — Z3493 Encounter for supervision of normal pregnancy, unspecified, third trimester: Secondary | ICD-10-CM

## 2015-10-01 DIAGNOSIS — Z3483 Encounter for supervision of other normal pregnancy, third trimester: Secondary | ICD-10-CM

## 2015-10-01 LAB — POCT URINALYSIS DIPSTICK
Bilirubin, UA: NEGATIVE
GLUCOSE UA: NEGATIVE
Ketones, UA: NEGATIVE
Leukocytes, UA: NEGATIVE
NITRITE UA: NEGATIVE
RBC UA: NEGATIVE
SPEC GRAV UA: 1.01
UROBILINOGEN UA: 0.2
pH, UA: 6

## 2015-10-01 NOTE — Progress Notes (Signed)
Subjective:    Emily Saunders is a 40 y.o. female being seen today for her obstetrical visit. She is at [redacted]w[redacted]d gestation. Patient reports no complaints. Fetal movement: normal.  Problem List Items Addressed This Visit    None    Visit Diagnoses   None.    Patient Active Problem List   Diagnosis Date Noted  . HTN (hypertension) 02/27/2015  . Unintended weight loss 10/27/2014  . Allergic rhinitis 06/23/2014  . Elevated BP 06/23/2014  . 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 Vitamin D insufficiency 03/03/2014  . Pilonidal cyst 03/03/2014  . Broken teeth 03/03/2014  . Poor sleep pattern 03/03/2014  . Obesity (BMI 30-39.9) 03/03/2014  . Current smoker 03/03/2014  . Anemia 05/27/2013   Objective:    BP 125/77   Pulse 87   Temp 98 F (36.7 C)   Wt 238 lb 1.6 oz (108 kg)   LMP 03/18/2015 (LMP Unknown)   BMI 38.43 kg/m   FHT:  150 BPM  Uterine Size: size equals dates  Presentation: unsure     Assessment:    Pregnancy @ [redacted]w[redacted]d weeks   Plan:     labs reviewed, problem list updated Consent signed. GBS sent TDAP offered  Rhogam given for RH negative Pediatrician: discussed. Infant feeding: plans to breastfeed. Maternity leave: discussed. Cigarette smokinformer smokerformer smoker. No orders of the defined types were placed in this encounter.  No orders of the defined types were placed in this encounter.  Follow up in 2 Weeks.

## 2015-10-01 NOTE — Addendum Note (Signed)
Addended by: Lewie Loron D on: 10/01/2015 11:22 AM   Modules accepted: Orders

## 2015-10-01 NOTE — Addendum Note (Signed)
Addended by: Dorothyann Gibbs on: 10/01/2015 10:35 AM   Modules accepted: Orders

## 2015-10-01 NOTE — Progress Notes (Signed)
Pt c/o throat pain and cough

## 2015-10-02 LAB — GLUCOSE TOLERANCE, 2 HOURS W/ 1HR
GLUCOSE, FASTING: 77 mg/dL (ref 65–91)
Glucose, 1 hour: 135 mg/dL (ref 65–179)
Glucose, 2 hour: 83 mg/dL (ref 65–152)

## 2015-10-02 LAB — CBC
HEMOGLOBIN: 11 g/dL — AB (ref 11.1–15.9)
Hematocrit: 33.4 % — ABNORMAL LOW (ref 34.0–46.6)
MCH: 30.5 pg (ref 26.6–33.0)
MCHC: 32.9 g/dL (ref 31.5–35.7)
MCV: 93 fL (ref 79–97)
PLATELETS: 171 10*3/uL (ref 150–379)
RBC: 3.61 x10E6/uL — AB (ref 3.77–5.28)
RDW: 14.8 % (ref 12.3–15.4)
WBC: 7.6 10*3/uL (ref 3.4–10.8)

## 2015-10-02 LAB — HIV ANTIBODY (ROUTINE TESTING W REFLEX): HIV SCREEN 4TH GENERATION: NONREACTIVE

## 2015-10-02 LAB — RPR: RPR Ser Ql: NONREACTIVE

## 2015-10-06 ENCOUNTER — Other Ambulatory Visit (HOSPITAL_COMMUNITY): Payer: Self-pay | Admitting: Maternal and Fetal Medicine

## 2015-10-06 ENCOUNTER — Ambulatory Visit (HOSPITAL_COMMUNITY)
Admission: RE | Admit: 2015-10-06 | Discharge: 2015-10-06 | Disposition: A | Discharge status: Home / Self Care | Payer: Medicaid Other | Source: Ambulatory Visit | Attending: Family Medicine | Admitting: Family Medicine

## 2015-10-06 ENCOUNTER — Encounter (HOSPITAL_COMMUNITY): Payer: Self-pay

## 2015-10-06 ENCOUNTER — Other Ambulatory Visit (HOSPITAL_COMMUNITY): Payer: Self-pay

## 2015-10-06 DIAGNOSIS — O99213 Obesity complicating pregnancy, third trimester: Secondary | ICD-10-CM | POA: Diagnosis not present

## 2015-10-06 DIAGNOSIS — O403XX0 Polyhydramnios, third trimester, not applicable or unspecified: Secondary | ICD-10-CM

## 2015-10-06 DIAGNOSIS — O09523 Supervision of elderly multigravida, third trimester: Secondary | ICD-10-CM

## 2015-10-06 DIAGNOSIS — E669 Obesity, unspecified: Secondary | ICD-10-CM | POA: Diagnosis not present

## 2015-10-06 DIAGNOSIS — Z3A28 28 weeks gestation of pregnancy: Secondary | ICD-10-CM | POA: Diagnosis not present

## 2015-10-06 DIAGNOSIS — O10919 Unspecified pre-existing hypertension complicating pregnancy, unspecified trimester: Secondary | ICD-10-CM

## 2015-10-14 ENCOUNTER — Encounter: Payer: Self-pay | Admitting: Obstetrics

## 2015-10-14 ENCOUNTER — Ambulatory Visit (INDEPENDENT_AMBULATORY_CARE_PROVIDER_SITE_OTHER): Payer: Medicaid Other | Admitting: Obstetrics

## 2015-10-14 VITALS — BP 109/74 | HR 73 | Wt 238.0 lb

## 2015-10-14 DIAGNOSIS — O163 Unspecified maternal hypertension, third trimester: Secondary | ICD-10-CM

## 2015-10-14 DIAGNOSIS — Z3483 Encounter for supervision of other normal pregnancy, third trimester: Secondary | ICD-10-CM

## 2015-10-14 DIAGNOSIS — O09523 Supervision of elderly multigravida, third trimester: Secondary | ICD-10-CM

## 2015-10-14 DIAGNOSIS — O403XX1 Polyhydramnios, third trimester, fetus 1: Secondary | ICD-10-CM

## 2015-10-14 DIAGNOSIS — O09893 Supervision of other high risk pregnancies, third trimester: Secondary | ICD-10-CM

## 2015-10-14 LAB — POCT URINALYSIS DIPSTICK
Bilirubin, UA: NEGATIVE
Blood, UA: NEGATIVE
Glucose, UA: NEGATIVE
Nitrite, UA: NEGATIVE
SPEC GRAV UA: 1.015
UROBILINOGEN UA: NEGATIVE
pH, UA: 6.5

## 2015-10-14 NOTE — Progress Notes (Signed)
Patient ID: Emily Saunders, female   DOB: Mar 03, 1975, 40 y.o.   MRN: [id number] Subjective:    Emily Saunders is a 39 y.o. female being seen today for her obstetrical visit. She is at [redacted]w[redacted]d gestation. Patient reports no complaints. Fetal movement: normal.  Problem List Items Addressed This Visit    None    Visit Diagnoses    Encounter for supervision of other normal pregnancy in third trimester    -  Primary   Relevant Orders   POCT urinalysis dipstick (Completed)     Patient Active Problem List   Diagnosis Date Noted  . HTN (hypertension) 02/27/2015  . Unintended weight loss 10/27/2014  . Allergic rhinitis 06/23/2014  . Elevated BP 06/23/2014  . 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 Vitamin D insufficiency 03/03/2014  . Pilonidal cyst 03/03/2014  . Broken teeth 03/03/2014  . Poor sleep pattern 03/03/2014  . Obesity (BMI 30-39.9) 03/03/2014  . Current smoker 03/03/2014  . Anemia 05/27/2013   Objective:    BP 109/74   Pulse 73   Wt 238 lb (108 kg)   LMP 03/18/2015 (LMP Unknown)   BMI 38.41 kg/m  FHT:  150 BPM  Uterine Size: size greater than dates  Presentation: unsure     Assessment:    Pregnancy @ [redacted]w[redacted]d weeks    AMA  Polyhydramnios  Plan:    Interval growth q 4 weeks  BPP's weekly starting at 32 weeks    labs reviewed, problem list updated Consent signed. GBS sent TDAP offered  Rhogam given for RH negative Pediatrician: discussed. Infant feeding: plans to breastfeed. Maternity leave: discussed. Cigarette smoking: former smoker.  Orders Placed This Encounter  Procedures  . POCT urinalysis dipstick   No orders of the defined types were placed in this encounter.  Follow up in 2 Weeks.

## 2015-10-27 ENCOUNTER — Encounter (HOSPITAL_COMMUNITY): Payer: Self-pay

## 2015-10-27 ENCOUNTER — Other Ambulatory Visit (HOSPITAL_COMMUNITY): Payer: Self-pay | Admitting: Obstetrics and Gynecology

## 2015-10-27 ENCOUNTER — Ambulatory Visit (HOSPITAL_COMMUNITY)
Admission: RE | Admit: 2015-10-27 | Discharge: 2015-10-27 | Disposition: A | Discharge status: Home / Self Care | Payer: Medicaid Other | Source: Ambulatory Visit | Attending: Family Medicine | Admitting: Family Medicine

## 2015-10-27 DIAGNOSIS — O34219 Maternal care for unspecified type scar from previous cesarean delivery: Secondary | ICD-10-CM

## 2015-10-27 DIAGNOSIS — O163 Unspecified maternal hypertension, third trimester: Secondary | ICD-10-CM | POA: Insufficient documentation

## 2015-10-27 DIAGNOSIS — Z3A31 31 weeks gestation of pregnancy: Secondary | ICD-10-CM | POA: Insufficient documentation

## 2015-10-27 DIAGNOSIS — O09523 Supervision of elderly multigravida, third trimester: Secondary | ICD-10-CM | POA: Diagnosis not present

## 2015-10-27 DIAGNOSIS — O10919 Unspecified pre-existing hypertension complicating pregnancy, unspecified trimester: Secondary | ICD-10-CM

## 2015-10-29 ENCOUNTER — Encounter: Payer: Self-pay | Admitting: Obstetrics

## 2015-10-29 ENCOUNTER — Ambulatory Visit (INDEPENDENT_AMBULATORY_CARE_PROVIDER_SITE_OTHER): Payer: Medicaid Other | Admitting: Obstetrics

## 2015-10-29 DIAGNOSIS — O09523 Supervision of elderly multigravida, third trimester: Secondary | ICD-10-CM

## 2015-10-29 DIAGNOSIS — O09893 Supervision of other high risk pregnancies, third trimester: Secondary | ICD-10-CM

## 2015-10-29 NOTE — Progress Notes (Signed)
Subjective:    Emily Saunders is a 40 y.o. female being seen today for her obstetrical visit. She is at [redacted]w[redacted]d gestation. Patient reports no complaints. Fetal movement: normal.  Problem List Items Addressed This Visit    None    Visit Diagnoses   None.    Patient Active Problem List   Diagnosis Date Noted  . HTN (hypertension) 02/27/2015  . Unintended weight loss 10/27/2014  . Allergic rhinitis 06/23/2014  . Elevated BP 06/23/2014  . 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 Vitamin D insufficiency 03/03/2014  . Pilonidal cyst 03/03/2014  . Broken teeth 03/03/2014  . Poor sleep pattern 03/03/2014  . Obesity (BMI 30-39.9) 03/03/2014  . Current smoker 03/03/2014  . Anemia 05/27/2013   Objective:    BP 123/78   Pulse 80   Wt 241 lb (109.3 kg)   LMP 03/18/2015 (LMP Unknown)   BMI 38.90 kg/m  FHT:  150 BPM  Uterine Size: size equals dates  Presentation: unsure     Assessment:    Pregnancy @ [redacted]w[redacted]d weeks   Plan:     labs reviewed, problem list updated Consent signed. GBS sent TDAP offered  Rhogam given for RH negative Pediatrician: discussed. Infant feeding: plans to breastfeed. Maternity leave: discussed. Cigarette smoking: former smoker. No orders of the defined types were placed in this encounter.  No orders of the defined types were placed in this encounter.  Follow up in 2 Weeks.   Patient ID: Emily Saunders, female   DOB: May 25, 1975, 40 y.o.   MRN: [id number]

## 2015-10-29 NOTE — Progress Notes (Signed)
Pt is having some LE swelling.

## 2015-11-03 ENCOUNTER — Ambulatory Visit (HOSPITAL_COMMUNITY)
Admission: RE | Admit: 2015-11-03 | Discharge: 2015-11-03 | Disposition: A | Discharge status: Home / Self Care | Payer: Medicaid Other | Source: Ambulatory Visit | Attending: Family Medicine | Admitting: Family Medicine

## 2015-11-03 ENCOUNTER — Encounter (HOSPITAL_COMMUNITY): Payer: Self-pay

## 2015-11-03 ENCOUNTER — Other Ambulatory Visit (HOSPITAL_COMMUNITY): Payer: Self-pay | Admitting: Obstetrics and Gynecology

## 2015-11-03 DIAGNOSIS — O10013 Pre-existing essential hypertension complicating pregnancy, third trimester: Secondary | ICD-10-CM | POA: Diagnosis not present

## 2015-11-03 DIAGNOSIS — Z3A32 32 weeks gestation of pregnancy: Secondary | ICD-10-CM | POA: Diagnosis not present

## 2015-11-03 DIAGNOSIS — O34219 Maternal care for unspecified type scar from previous cesarean delivery: Secondary | ICD-10-CM

## 2015-11-03 DIAGNOSIS — O09523 Supervision of elderly multigravida, third trimester: Secondary | ICD-10-CM | POA: Insufficient documentation

## 2015-11-03 DIAGNOSIS — O99213 Obesity complicating pregnancy, third trimester: Secondary | ICD-10-CM

## 2015-11-03 DIAGNOSIS — O10919 Unspecified pre-existing hypertension complicating pregnancy, unspecified trimester: Secondary | ICD-10-CM

## 2015-11-06 ENCOUNTER — Encounter: Payer: Self-pay | Admitting: Obstetrics

## 2015-11-06 ENCOUNTER — Ambulatory Visit (INDEPENDENT_AMBULATORY_CARE_PROVIDER_SITE_OTHER): Payer: Medicaid Other | Admitting: Obstetrics

## 2015-11-06 DIAGNOSIS — Z3493 Encounter for supervision of normal pregnancy, unspecified, third trimester: Secondary | ICD-10-CM | POA: Diagnosis not present

## 2015-11-06 DIAGNOSIS — O403XX1 Polyhydramnios, third trimester, fetus 1: Secondary | ICD-10-CM

## 2015-11-06 NOTE — Progress Notes (Signed)
Subjective:    Emily Saunders is a 40 y.o. female being seen today for her obstetrical visit. She is at [redacted]w[redacted]d gestation. Patient reports no complaints. Fetal movement: normal.  Problem List Items Addressed This Visit    None    Visit Diagnoses   None.    Patient Active Problem List   Diagnosis Date Noted  . HTN (hypertension) 02/27/2015  . Unintended weight loss 10/27/2014  . Allergic rhinitis 06/23/2014  . Elevated BP 06/23/2014  . 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 Vitamin D insufficiency 03/03/2014  . Pilonidal cyst 03/03/2014  . Broken teeth 03/03/2014  . Poor sleep pattern 03/03/2014  . Obesity (BMI 30-39.9) 03/03/2014  . Current smoker 03/03/2014  . Anemia 05/27/2013   Objective:    BP 125/86   Pulse 76   LMP 03/18/2015 (LMP Unknown)  FHT:  150 BPM  Uterine Size: size equals dates  Presentation: unsure     Assessment:    Pregnancy @ [redacted]w[redacted]d weeks   Plan:     labs reviewed, problem list updated Consent signed. GBS sent TDAP offered  Rhogam given for RH negative Pediatrician: discussed. Infant feeding: plans to breastfeed. Maternity leave: discussed. Cigarette smokinformer smokerformer smoker. No orders of the defined types were placed in this encounter.  No orders of the defined types were placed in this encounter.  Follow up in 2 Weeks.   Patient ID: Emily Saunders, female   DOB: 08-12-1975, 40 y.o.   MRN: [id number]

## 2015-11-10 ENCOUNTER — Encounter (HOSPITAL_COMMUNITY): Payer: Self-pay

## 2015-11-10 ENCOUNTER — Ambulatory Visit (HOSPITAL_COMMUNITY)
Admission: RE | Admit: 2015-11-10 | Discharge: 2015-11-10 | Disposition: A | Discharge status: Home / Self Care | Payer: Medicaid Other | Source: Ambulatory Visit | Attending: Family Medicine | Admitting: Family Medicine

## 2015-11-10 DIAGNOSIS — O10919 Unspecified pre-existing hypertension complicating pregnancy, unspecified trimester: Secondary | ICD-10-CM

## 2015-11-10 DIAGNOSIS — Z3A33 33 weeks gestation of pregnancy: Secondary | ICD-10-CM | POA: Insufficient documentation

## 2015-11-10 DIAGNOSIS — O09523 Supervision of elderly multigravida, third trimester: Secondary | ICD-10-CM | POA: Diagnosis present

## 2015-11-10 DIAGNOSIS — O99213 Obesity complicating pregnancy, third trimester: Secondary | ICD-10-CM | POA: Insufficient documentation

## 2015-11-10 DIAGNOSIS — O10013 Pre-existing essential hypertension complicating pregnancy, third trimester: Secondary | ICD-10-CM | POA: Diagnosis not present

## 2015-11-10 DIAGNOSIS — O34219 Maternal care for unspecified type scar from previous cesarean delivery: Secondary | ICD-10-CM | POA: Diagnosis not present

## 2015-11-13 ENCOUNTER — Ambulatory Visit (INDEPENDENT_AMBULATORY_CARE_PROVIDER_SITE_OTHER): Payer: Medicaid Other | Admitting: Obstetrics

## 2015-11-13 ENCOUNTER — Encounter: Payer: Self-pay | Admitting: *Deleted

## 2015-11-13 VITALS — BP 141/87 | HR 87 | Wt 245.0 lb

## 2015-11-13 DIAGNOSIS — O403XX0 Polyhydramnios, third trimester, not applicable or unspecified: Secondary | ICD-10-CM | POA: Diagnosis not present

## 2015-11-13 DIAGNOSIS — O09521 Supervision of elderly multigravida, first trimester: Secondary | ICD-10-CM | POA: Diagnosis not present

## 2015-11-13 DIAGNOSIS — O403XX1 Polyhydramnios, third trimester, fetus 1: Secondary | ICD-10-CM

## 2015-11-13 DIAGNOSIS — Z3493 Encounter for supervision of normal pregnancy, unspecified, third trimester: Secondary | ICD-10-CM

## 2015-11-13 NOTE — Progress Notes (Signed)
Patient reports swelling in her feet and ankles and she is having trouble standing at work. She would like to go ahead and start her leave

## 2015-11-16 ENCOUNTER — Encounter: Payer: Self-pay | Admitting: Obstetrics

## 2015-11-16 NOTE — Progress Notes (Signed)
Subjective:    Emily Saunders is a 40 y.o. female being seen today for her obstetrical visit. She is at [redacted]w[redacted]d gestation. Patient reports backache and occasional contractions. Fetal movement: normal.  Problem List Items Addressed This Visit    None    Visit Diagnoses   None.    Patient Active Problem List   Diagnosis Date Noted  . HTN (hypertension) 02/27/2015  . Unintended weight loss 10/27/2014  . Allergic rhinitis 06/23/2014  . Elevated BP 06/23/2014  . 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 Vitamin D insufficiency 03/03/2014  . Pilonidal cyst 03/03/2014  . Broken teeth 03/03/2014  . Poor sleep pattern 03/03/2014  . Obesity (BMI 30-39.9) 03/03/2014  . Current smoker 03/03/2014  . Anemia 05/27/2013   Objective:    BP (!) 141/87   Pulse 87   Wt 245 lb (111.1 kg)   LMP 03/18/2015 (LMP Unknown)   BMI 39.54 kg/m  FHT:  150 BPM  Uterine Size: Size>Dates  Presentation: unsure     Assessment:    Pregnancy @ [redacted]w[redacted]d weeks   Plan:     labs reviewed, problem list updated Consent signed. GBS sent TDAP offered  Rhogam given for RH negative Pediatrician: discussed. Infant feeding: plans to breastfeed. Maternity leave: discussed. Cigarette smoking: former smoker. No orders of the defined types were placed in this encounter.  No orders of the defined types were placed in this encounter.  Follow up in 1 Week.   Patient ID: Emily Saunders, female   DOB: 02/20/1975, 40 y.o.   MRN: [id number]

## 2015-11-17 ENCOUNTER — Encounter (HOSPITAL_COMMUNITY): Payer: Self-pay

## 2015-11-17 ENCOUNTER — Other Ambulatory Visit (HOSPITAL_COMMUNITY): Payer: Self-pay | Admitting: *Deleted

## 2015-11-17 ENCOUNTER — Ambulatory Visit (HOSPITAL_COMMUNITY)
Admission: RE | Admit: 2015-11-17 | Discharge: 2015-11-17 | Disposition: A | Discharge status: Home / Self Care | Payer: Medicaid Other | Source: Ambulatory Visit | Attending: Family Medicine | Admitting: Family Medicine

## 2015-11-17 DIAGNOSIS — O10013 Pre-existing essential hypertension complicating pregnancy, third trimester: Secondary | ICD-10-CM | POA: Insufficient documentation

## 2015-11-17 DIAGNOSIS — O34219 Maternal care for unspecified type scar from previous cesarean delivery: Secondary | ICD-10-CM | POA: Diagnosis not present

## 2015-11-17 DIAGNOSIS — O99213 Obesity complicating pregnancy, third trimester: Secondary | ICD-10-CM | POA: Insufficient documentation

## 2015-11-17 DIAGNOSIS — Z3A34 34 weeks gestation of pregnancy: Secondary | ICD-10-CM | POA: Diagnosis not present

## 2015-11-17 DIAGNOSIS — O10919 Unspecified pre-existing hypertension complicating pregnancy, unspecified trimester: Secondary | ICD-10-CM

## 2015-11-17 DIAGNOSIS — O09523 Supervision of elderly multigravida, third trimester: Secondary | ICD-10-CM | POA: Insufficient documentation

## 2015-11-20 ENCOUNTER — Encounter: Payer: Self-pay | Admitting: *Deleted

## 2015-11-20 ENCOUNTER — Encounter: Payer: Self-pay | Admitting: Obstetrics

## 2015-11-20 ENCOUNTER — Ambulatory Visit (INDEPENDENT_AMBULATORY_CARE_PROVIDER_SITE_OTHER): Payer: Medicaid Other | Admitting: Obstetrics

## 2015-11-20 VITALS — BP 129/90 | HR 83 | Temp 98.4°F | Wt 250.3 lb

## 2015-11-20 DIAGNOSIS — O403XX0 Polyhydramnios, third trimester, not applicable or unspecified: Secondary | ICD-10-CM

## 2015-11-20 DIAGNOSIS — O09523 Supervision of elderly multigravida, third trimester: Secondary | ICD-10-CM | POA: Diagnosis not present

## 2015-11-20 DIAGNOSIS — Z3493 Encounter for supervision of normal pregnancy, unspecified, third trimester: Secondary | ICD-10-CM

## 2015-11-20 DIAGNOSIS — O163 Unspecified maternal hypertension, third trimester: Secondary | ICD-10-CM

## 2015-11-20 DIAGNOSIS — O403XX1 Polyhydramnios, third trimester, fetus 1: Secondary | ICD-10-CM

## 2015-11-20 LAB — OB RESULTS CONSOLE GBS: STREP GROUP B AG: NEGATIVE

## 2015-11-20 NOTE — Progress Notes (Signed)
Subjective:    Emily Saunders is a 40 y.o. female being seen today for her obstetrical visit. She is at [redacted]w[redacted]d gestation. Patient reports no complaints. Fetal movement: normal.  Problem List Items Addressed This Visit    None    Visit Diagnoses    Prenatal care, third trimester    -  Primary     Patient Active Problem List   Diagnosis Date Noted  . HTN (hypertension) 02/27/2015  . Unintended weight loss 10/27/2014  . Allergic rhinitis 06/23/2014  . Elevated BP 06/23/2014  . 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 Vitamin D insufficiency 03/03/2014  . Pilonidal cyst 03/03/2014  . Broken teeth 03/03/2014  . Poor sleep pattern 03/03/2014  . Obesity (BMI 30-39.9) 03/03/2014  . Current smoker 03/03/2014  . Anemia 05/27/2013   Objective:    BP 129/90   Pulse 83   Temp 98.4 F (36.9 C)   Wt 250 lb 4.8 oz (113.5 kg)   LMP 03/18/2015 (LMP Unknown)   BMI 40.40 kg/m  FHT:  150 BPM  Uterine Size: size greater than dates  Presentation: unsure     Assessment:    Pregnancy @ [redacted]w[redacted]d weeks   Plan:     labs reviewed, problem list updated Consent signed. GBS sent TDAP offered  Rhogam given for RH negative Pediatrician: discussed. Infant feeding: plans to breastfeed. Maternity leave: discussed. Cigarette smoking: former smoker. No orders of the defined types were placed in this encounter.  No orders of the defined types were placed in this encounter.  Follow up in 1 Week.

## 2015-11-20 NOTE — Addendum Note (Signed)
Addended by: Manuela Schwartz C on: 11/20/2015 11:54 AM   Modules accepted: Orders

## 2015-11-22 LAB — STREP GP B NAA: Strep Gp B NAA: NEGATIVE

## 2015-11-24 ENCOUNTER — Other Ambulatory Visit (HOSPITAL_COMMUNITY): Payer: Self-pay | Admitting: Maternal and Fetal Medicine

## 2015-11-24 ENCOUNTER — Ambulatory Visit (HOSPITAL_COMMUNITY)
Admission: RE | Admit: 2015-11-24 | Discharge: 2015-11-24 | Disposition: A | Discharge status: Home / Self Care | Payer: Medicaid Other | Source: Ambulatory Visit | Attending: Obstetrics | Admitting: Obstetrics

## 2015-11-24 ENCOUNTER — Encounter (HOSPITAL_COMMUNITY): Payer: Self-pay

## 2015-11-24 DIAGNOSIS — O10919 Unspecified pre-existing hypertension complicating pregnancy, unspecified trimester: Secondary | ICD-10-CM

## 2015-11-24 DIAGNOSIS — O283 Abnormal ultrasonic finding on antenatal screening of mother: Secondary | ICD-10-CM | POA: Diagnosis not present

## 2015-11-24 DIAGNOSIS — Z3A35 35 weeks gestation of pregnancy: Secondary | ICD-10-CM | POA: Diagnosis not present

## 2015-11-24 DIAGNOSIS — O10013 Pre-existing essential hypertension complicating pregnancy, third trimester: Secondary | ICD-10-CM | POA: Insufficient documentation

## 2015-11-24 HISTORY — DX: Essential (primary) hypertension: I10

## 2015-11-27 ENCOUNTER — Inpatient Hospital Stay (HOSPITAL_COMMUNITY)
Admission: AD | Admit: 2015-11-27 | Discharge: 2015-11-27 | Disposition: A | Discharge status: Home / Self Care | Payer: Medicaid Other | Source: Ambulatory Visit | Attending: Obstetrics & Gynecology | Admitting: Obstetrics & Gynecology

## 2015-11-27 ENCOUNTER — Other Ambulatory Visit: Payer: Medicaid Other

## 2015-11-27 ENCOUNTER — Inpatient Hospital Stay (HOSPITAL_COMMUNITY): Payer: Medicaid Other

## 2015-11-27 ENCOUNTER — Encounter (HOSPITAL_COMMUNITY): Payer: Self-pay | Admitting: *Deleted

## 2015-11-27 DIAGNOSIS — Z87891 Personal history of nicotine dependence: Secondary | ICD-10-CM | POA: Diagnosis not present

## 2015-11-27 DIAGNOSIS — O10913 Unspecified pre-existing hypertension complicating pregnancy, third trimester: Secondary | ICD-10-CM | POA: Diagnosis not present

## 2015-11-27 DIAGNOSIS — O288 Other abnormal findings on antenatal screening of mother: Secondary | ICD-10-CM | POA: Diagnosis present

## 2015-11-27 DIAGNOSIS — O10013 Pre-existing essential hypertension complicating pregnancy, third trimester: Secondary | ICD-10-CM | POA: Insufficient documentation

## 2015-11-27 DIAGNOSIS — Z3A36 36 weeks gestation of pregnancy: Secondary | ICD-10-CM | POA: Diagnosis not present

## 2015-11-27 DIAGNOSIS — O10919 Unspecified pre-existing hypertension complicating pregnancy, unspecified trimester: Secondary | ICD-10-CM

## 2015-11-27 DIAGNOSIS — O403XX0 Polyhydramnios, third trimester, not applicable or unspecified: Secondary | ICD-10-CM | POA: Diagnosis not present

## 2015-11-27 DIAGNOSIS — I10 Essential (primary) hypertension: Secondary | ICD-10-CM | POA: Diagnosis not present

## 2015-11-27 DIAGNOSIS — Z3689 Encounter for other specified antenatal screening: Secondary | ICD-10-CM

## 2015-11-27 NOTE — Discharge Instructions (Signed)
Hypertension During Pregnancy Hypertension is also called high blood pressure. Blood pressure moves blood in your body. Sometimes, the force that moves the blood becomes too strong. When you are pregnant, this condition should be watched carefully. It can cause problems for you and your baby. HOME CARE   Make and keep all of your doctor visits.  Take medicine as told by your doctor. Tell your doctor about all medicines you take.  Eat very little salt.  Exercise regularly.  Do not drink alcohol.  Do not smoke.  Do not have drinks with caffeine.  Lie on your left side when resting.  Your health care provider may ask you to take one low-dose aspirin (81mg ) each day. GET HELP RIGHT AWAY IF:  You have bad belly (abdominal) pain.  You have sudden puffiness (swelling) in the hands, ankles, or face.  You gain 4 pounds (1.8 kilograms) or more in 1 week.  You throw up (vomit) repeatedly.  You have bleeding from the vagina.  You do not feel the baby moving as much.  You have a headache.  You have blurred or double vision.  You have muscle twitching or spasms.  You have shortness of breath.  You have blue fingernails and lips.  You have blood in your pee (urine). MAKE SURE YOU:  Understand these instructions.  Will watch your condition.  Will get help right away if you are not doing well or get worse.   This information is not intended to replace advice given to you by your health care provider. Make sure you discuss any questions you have with your health care provider.   Document Released: 02/26/2010 Document Revised: 02/14/2014 Document Reviewed: 08/23/2012 Elsevier Interactive Patient Education 2016 [place].  Fetal Movement Counts Patient Name: __________________________________________________ Patient Due Date: ____________________ Performing a fetal movement count is highly recommended in high-risk pregnancies, but it is good for every pregnant woman to  do. Your health care provider may ask you to start counting fetal movements at 28 weeks of the pregnancy. Fetal movements often increase:  After eating a full meal.  After physical activity.  After eating or drinking something sweet or cold.  At rest. Pay attention to when you feel the baby is most active. This will help you notice a pattern of your baby's sleep and wake cycles and what factors contribute to an increase in fetal movement. It is important to perform a fetal movement count at the same time each day when your baby is normally most active.  HOW TO COUNT FETAL MOVEMENTS 1. Find a quiet and comfortable area to sit or lie down on your left side. Lying on your left side provides the best blood and oxygen circulation to your baby. 2. Write down the day and time on a sheet of paper or in a journal. 3. Start counting kicks, flutters, swishes, rolls, or jabs in a 2-hour period. You should feel at least 10 movements within 2 hours. 4. If you do not feel 10 movements in 2 hours, wait 2-3 hours and count again. Look for a change in the pattern or not enough counts in 2 hours. SEEK MEDICAL CARE IF:  You feel less than 10 counts in 2 hours, tried twice.  There is no movement in over an hour.  The pattern is changing or taking longer each day to reach 10 counts in 2 hours.  You feel the baby is not moving as he or she usually does. Date: ____________ Movements: ____________ Start time:  ____________ Elizebeth Koller time: ____________  Date: ____________ Movements: ____________ Start time: ____________ Elizebeth Koller time: ____________ Date: ____________ Movements: ____________ Start time: ____________ Elizebeth Koller time: ____________ Date: ____________ Movements: ____________ Start time: ____________ Elizebeth Koller time: ____________ Date: ____________ Movements: ____________ Start time: ____________ Elizebeth Koller time: ____________ Date: ____________ Movements: ____________ Start time: ____________ Elizebeth Koller time:  ____________ Date: ____________ Movements: ____________ Start time: ____________ Elizebeth Koller time: ____________ Date: ____________ Movements: ____________ Start time: ____________ Elizebeth Koller time: ____________  Date: ____________ Movements: ____________ Start time: ____________ Elizebeth Koller time: ____________ Date: ____________ Movements: ____________ Start time: ____________ Elizebeth Koller time: ____________ Date: ____________ Movements: ____________ Start time: ____________ Elizebeth Koller time: ____________ Date: ____________ Movements: ____________ Start time: ____________ Elizebeth Koller time: ____________ Date: ____________ Movements: ____________ Start time: ____________ Elizebeth Koller time: ____________ Date: ____________ Movements: ____________ Start time: ____________ Elizebeth Koller time: ____________ Date: ____________ Movements: ____________ Start time: ____________ Elizebeth Koller time: ____________  Date: ____________ Movements: ____________ Start time: ____________ Elizebeth Koller time: ____________ Date: ____________ Movements: ____________ Start time: ____________ Elizebeth Koller time: ____________ Date: ____________ Movements: ____________ Start time: ____________ Elizebeth Koller time: ____________ Date: ____________ Movements: ____________ Start time: ____________ Elizebeth Koller time: ____________ Date: ____________ Movements: ____________ Start time: ____________ Elizebeth Koller time: ____________ Date: ____________ Movements: ____________ Start time: ____________ Elizebeth Koller time: ____________ Date: ____________ Movements: ____________ Start time: ____________ Elizebeth Koller time: ____________  Date: ____________ Movements: ____________ Start time: ____________ Elizebeth Koller time: ____________ Date: ____________ Movements: ____________ Start time: ____________ Elizebeth Koller time: ____________ Date: ____________ Movements: ____________ Start time: ____________ Elizebeth Koller time: ____________ Date: ____________ Movements: ____________ Start time: ____________ Elizebeth Koller time: ____________ Date: ____________ Movements:  ____________ Start time: ____________ Elizebeth Koller time: ____________ Date: ____________ Movements: ____________ Start time: ____________ Elizebeth Koller time: ____________ Date: ____________ Movements: ____________ Start time: ____________ Elizebeth Koller time: ____________  Date: ____________ Movements: ____________ Start time: ____________ Elizebeth Koller time: ____________ Date: ____________ Movements: ____________ Start time: ____________ Elizebeth Koller time: ____________ Date: ____________ Movements: ____________ Start time: ____________ Elizebeth Koller time: ____________ Date: ____________ Movements: ____________ Start time: ____________ Elizebeth Koller time: ____________ Date: ____________ Movements: ____________ Start time: ____________ Elizebeth Koller time: ____________ Date: ____________ Movements: ____________ Start time: ____________ Elizebeth Koller time: ____________ Date: ____________ Movements: ____________ Start time: ____________ Elizebeth Koller time: ____________  Date: ____________ Movements: ____________ Start time: ____________ Elizebeth Koller time: ____________ Date: ____________ Movements: ____________ Start time: ____________ Elizebeth Koller time: ____________ Date: ____________ Movements: ____________ Start time: ____________ Elizebeth Koller time: ____________ Date: ____________ Movements: ____________ Start time: ____________ Elizebeth Koller time: ____________ Date: ____________ Movements: ____________ Start time: ____________ Elizebeth Koller time: ____________ Date: ____________ Movements: ____________ Start time: ____________ Elizebeth Koller time: ____________ Date: ____________ Movements: ____________ Start time: ____________ Elizebeth Koller time: ____________  Date: ____________ Movements: ____________ Start time: ____________ Elizebeth Koller time: ____________ Date: ____________ Movements: ____________ Start time: ____________ Elizebeth Koller time: ____________ Date: ____________ Movements: ____________ Start time: ____________ Elizebeth Koller time: ____________ Date: ____________ Movements: ____________ Start time: ____________ Elizebeth Koller  time: ____________ Date: ____________ Movements: ____________ Start time: ____________ Elizebeth Koller time: ____________ Date: ____________ Movements: ____________ Start time: ____________ Elizebeth Koller time: ____________ Date: ____________ Movements: ____________ Start time: ____________ Elizebeth Koller time: ____________  Date: ____________ Movements: ____________ Start time: ____________ Elizebeth Koller time: ____________ Date: ____________ Movements: ____________ Start time: ____________ Elizebeth Koller time: ____________ Date: ____________ Movements: ____________ Start time: ____________ Elizebeth Koller time: ____________ Date: ____________ Movements: ____________ Start time: ____________ Elizebeth Koller time: ____________ Date: ____________ Movements: ____________ Start time: ____________ Elizebeth Koller time: ____________ Date: ____________ Movements: ____________ Start time: ____________ Elizebeth Koller time: ____________   This information is not intended to replace advice given to you by your health care provider. Make sure you discuss any questions you have with your health care provider.   Document Released:  02/23/2006 Document Revised: 02/14/2014 Document Reviewed: 11/21/2011 Elsevier Interactive Patient Education [employer].

## 2015-11-27 NOTE — MAU Note (Signed)
Patient was seen in Dr. Jacelyn Grip office this morning strip non reactive NST and sent for monitoring.

## 2015-11-27 NOTE — Progress Notes (Signed)
History     CSN: [id number]  Arrival date and time: 11/27/15 1039   None     No chief complaint on file.  Patient presented to the MAU today after non reassuring NST's were obtained in clinic this morning. Patient denies any N/V, SOB, Cp, abdominal pain, cramping, vaginal bleeding, change in vaginal discharge, or new calf or ankle edema.   Contraception: Would like Depo but is considering sterilization   Past Medical History:  Diagnosis Date  . H/O pilonidal cyst 2002   . Hypertension   . PID (acute pelvic inflammatory disease) 2006   . Vitamin D deficiency 2015     Past Surgical History:  Procedure Laterality Date  . CESAREAN SECTION    . CYST EXCISION Right 2003   hand    Family History  Problem Relation Age of Onset  . Hypertension Mother   . Diabetes Mother   . Asthma Mother   . COPD Mother   . Depression Mother   . Miscarriages / [place] Mother   . Vision loss Mother   . Parkinson's disease Father   . Diabetes Maternal Aunt   . Diabetes Maternal Uncle   . Diabetes Cousin     Social History  Substance Use Topics  . Smoking status: Former Smoker    Packs/day: 0.75    Types: Cigarettes  . Smokeless tobacco: Never Used     Comment: Smoking .5 ppd  . Alcohol use No    Allergies: No Known Allergies  Prescriptions Prior to Admission  Medication Sig Dispense Refill Last Dose  . Prenatal Vit-Fe Fumarate-FA (PRENATAL VITAMIN PO) Take 1 tablet by mouth daily.    11/26/2015 at Unknown time    Review of Systems  Constitutional: Negative for chills and fever.  Eyes: Negative for blurred vision.  Respiratory: Negative for shortness of breath.   Cardiovascular: Negative for chest pain and palpitations.  Gastrointestinal: Negative for abdominal pain, constipation, diarrhea, nausea and vomiting.  Genitourinary: Negative for dysuria.  Neurological: Negative for dizziness and headaches.  Psychiatric/Behavioral: Negative for depression.   Physical Exam    Blood pressure 136/84, pulse 79, temperature 97.9 F (36.6 C), temperature source Oral, resp. rate 16, height 5\' 6"  (1.676 m), weight 242 lb (109.8 kg), last menstrual period 03/18/2015.  Physical Exam  Constitutional: She is oriented to person, place, and time. She appears well-developed.  HENT:  Head: Normocephalic and atraumatic.  Mouth/Throat: Oropharynx is clear and moist.  Eyes: Conjunctivae and EOM are normal. Pupils are equal, round, and reactive to light.  Neck: Normal range of motion.  Cardiovascular: Normal rate, regular rhythm and intact distal pulses.   Respiratory: No respiratory distress.  GI: Bowel sounds are normal. There is no tenderness.  Musculoskeletal: Normal range of motion.  Trace lower extremity edema  Neurological: She is alert and oriented to person, place, and time. No cranial nerve deficit.  Skin: Skin is warm and dry.  Psychiatric: She has a normal mood and affect. Her behavior is normal. Judgment and thought content normal.    MAU Course  Procedures  MDM Patient seen for non reassuring NST. Fetal heart tracings have been reassuring at category 1. BPP with non stress was 8/8. Patient is to attend follow up appointment that is scheduled for next week  Assessment and Plan  Non-reassuring NST: Fetal heart tracings are category 1 on evaluation with an 8/8 BPP. Patient will be discharged from the MAU and reminded to keep current scheduled follow up appointments. - Return to  MAU if pain, discomfort, vaginal bleeding, cramping, or changes in discharge develop  Josue D Santos 11/27/2015, 1:32 PM

## 2015-11-27 NOTE — MAU Provider Note (Signed)
History     CSN: [id number]  Arrival date and time: 11/27/15 1039   CC:  Non-reactive NST in office today.  Patient presented to the MAU today after non reassuring NST's were obtained in clinic this morning. Patient denies any N/V, SOB, Cp, abdominal pain, cramping, vaginal bleeding, change in vaginal discharge, or new calf or ankle edema. Good fetal movement. Denies contractions.  Contraception: Would like Depo but is considering sterilization       Past Medical History:  Diagnosis Date  . H/O pilonidal cyst 2002   . Hypertension   . PID (acute pelvic inflammatory disease) 2006   . Vitamin D deficiency 2015          Past Surgical History:  Procedure Laterality Date  . CESAREAN SECTION    . CYST EXCISION Right 2003   hand         Family History  Problem Relation Age of Onset  . Hypertension Mother   . Diabetes Mother   . Asthma Mother   . COPD Mother   . Depression Mother   . Miscarriages / [place] Mother   . Vision loss Mother   . Parkinson's disease Father   . Diabetes Maternal Aunt   . Diabetes Maternal Uncle   . Diabetes Cousin           Social History  Substance Use Topics  . Smoking status: Former Smoker    Packs/day: 0.75    Types: Cigarettes  . Smokeless tobacco: Never Used     Comment: Smoking .5 ppd  . Alcohol use No    Allergies: No Known Allergies  Prescriptions Prior to Admission  Medication Sig Dispense Refill Last Dose  . Prenatal Vit-Fe Fumarate-FA (PRENATAL VITAMIN PO) Take 1 tablet by mouth daily.    11/26/2015 at Unknown time    Review of Systems  Constitutional: Negative for chills and fever.  Eyes: Negative for blurred vision.  Respiratory: Negative for shortness of breath.   Cardiovascular: Negative for chest pain and palpitations.  Gastrointestinal: Negative for abdominal pain, constipation, diarrhea, nausea and vomiting.  Genitourinary: Negative for dysuria.  Neurological:  Negative for dizziness and headaches.  Psychiatric/Behavioral: Negative for depression.   Physical Exam   Blood pressure 136/84, pulse 79, temperature 97.9 F (36.6 C), temperature source Oral, resp. rate 16, height 5\' 6"  (1.676 m), weight 242 lb (109.8 kg), last menstrual period 03/18/2015.  Physical Exam  Constitutional: She is oriented to person, place, and time. She appears well-developed.  HENT:  Head: Normocephalic and atraumatic.  Mouth/Throat: Oropharynx is clear and moist.  Eyes: Conjunctivae and EOM are normal. Pupils are equal, round, and reactive to light.  Neck: Normal range of motion.  Cardiovascular: Normal rate, regular rhythm and intact distal pulses.   Respiratory: No respiratory distress.  GI: Bowel sounds are normal. There is no tenderness.  Musculoskeletal: Normal range of motion.  Trace lower extremity edema  Neurological: She is alert and oriented to person, place, and time. No cranial nerve deficit.  Skin: Skin is warm and dry.  Psychiatric: She has a normal mood and affect. Her behavior is normal. Judgment and thought content normal.    MAU Course  Procedures  MDM Patient seen for non reassuring NST. Fetal heart tracings have been reassuring at category 1. BPP with non stress was 8/8. Patient is to attend follow up appointment that is scheduled for next week  Assessment and Plan  Reassuring NST: Fetal heart tracings are category 1 on evaluation with an  8/8 BPP. Patient will be discharged from the MAU and reminded to keep current scheduled follow up appointments. - Return to MAU if pain, discomfort, vaginal bleeding, cramping, or changes in discharge develop  Josue D Santos 11/27/2015, 1:32 PM    OB FELLOW MAU DISCHARGE ATTESTATION  I have seen and examined this patient; I agree with above documentation in the resident's note.   Patient also has known CHTN, not currently on medications. Her initial BP was slightly elevated but on multiple  repeat vitals, her BP was normal. She had no signs/symptoms of preE. Follow up in office for BP monitoring. Preeclampsia signs/symptoms reviewed with patient.    Katherine Basset, DO OB Fellow

## 2015-12-01 ENCOUNTER — Encounter (HOSPITAL_COMMUNITY): Payer: Self-pay

## 2015-12-01 ENCOUNTER — Other Ambulatory Visit (HOSPITAL_COMMUNITY): Payer: Self-pay | Admitting: Obstetrics and Gynecology

## 2015-12-01 ENCOUNTER — Ambulatory Visit (HOSPITAL_COMMUNITY)
Admission: RE | Admit: 2015-12-01 | Discharge: 2015-12-01 | Disposition: A | Discharge status: Home / Self Care | Payer: Medicaid Other | Source: Ambulatory Visit | Attending: Obstetrics | Admitting: Obstetrics

## 2015-12-01 DIAGNOSIS — O10919 Unspecified pre-existing hypertension complicating pregnancy, unspecified trimester: Secondary | ICD-10-CM

## 2015-12-01 DIAGNOSIS — O10013 Pre-existing essential hypertension complicating pregnancy, third trimester: Secondary | ICD-10-CM | POA: Diagnosis not present

## 2015-12-01 DIAGNOSIS — Z3A36 36 weeks gestation of pregnancy: Secondary | ICD-10-CM | POA: Insufficient documentation

## 2015-12-02 ENCOUNTER — Other Ambulatory Visit: Payer: Self-pay | Admitting: Obstetrics

## 2015-12-04 ENCOUNTER — Other Ambulatory Visit (INDEPENDENT_AMBULATORY_CARE_PROVIDER_SITE_OTHER): Payer: Medicaid Other

## 2015-12-04 ENCOUNTER — Ambulatory Visit (INDEPENDENT_AMBULATORY_CARE_PROVIDER_SITE_OTHER): Payer: Medicaid Other | Admitting: Certified Nurse Midwife

## 2015-12-04 ENCOUNTER — Encounter: Payer: Self-pay | Admitting: *Deleted

## 2015-12-04 ENCOUNTER — Other Ambulatory Visit: Payer: Medicaid Other

## 2015-12-04 VITALS — BP 125/90 | HR 71 | Temp 98.7°F | Wt 237.0 lb

## 2015-12-04 DIAGNOSIS — O0993 Supervision of high risk pregnancy, unspecified, third trimester: Secondary | ICD-10-CM

## 2015-12-04 DIAGNOSIS — Z23 Encounter for immunization: Secondary | ICD-10-CM | POA: Diagnosis not present

## 2015-12-04 DIAGNOSIS — O10913 Unspecified pre-existing hypertension complicating pregnancy, third trimester: Secondary | ICD-10-CM

## 2015-12-04 DIAGNOSIS — O288 Other abnormal findings on antenatal screening of mother: Secondary | ICD-10-CM

## 2015-12-04 DIAGNOSIS — O0990 Supervision of high risk pregnancy, unspecified, unspecified trimester: Secondary | ICD-10-CM | POA: Insufficient documentation

## 2015-12-04 MED ORDER — LABETALOL HCL 200 MG PO TABS: 200.0000 mg | ORAL_TABLET | Freq: Two times a day (BID) | ORAL | 0 refills | Status: DC

## 2015-12-04 NOTE — Progress Notes (Signed)
Subjective:    Emily Saunders is a 40 y.o. female being seen today for her obstetrical visit. She is at [redacted]w[redacted]d gestation. Patient reports no complaints. Fetal movement: normal.  Problem List Items Addressed This Visit      Other   Supervision of high-risk pregnancy   Relevant Medications   labetalol (NORMODYNE) 200 MG tablet   Other Relevant Orders   Fetal nonstress test   US FETAL BPP W/NONSTRESS   Tdap vaccine greater than or equal to 7yo IM (Completed)    Other Visit Diagnoses    Non-reactive NST (non-stress test)    -  Primary   Relevant Orders   US FETAL BPP W/NONSTRESS     Patient Active Problem List   Diagnosis Date Noted  . Supervision of high-risk pregnancy 12/04/2015  . HTN (hypertension) 02/27/2015  . Unintended weight loss 10/27/2014  . Allergic rhinitis 06/23/2014  . Elevated BP 06/23/2014  . 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 Vitamin D insufficiency 03/03/2014  . Pilonidal cyst 03/03/2014  . Broken teeth 03/03/2014  . Poor sleep pattern 03/03/2014  . Obesity (BMI 30-39.9) 03/03/2014  . Current smoker 03/03/2014  . Anemia 05/27/2013    Objective:    BP 125/90   Pulse 71   Temp 98.7 F (37.1 C)   Wt 237 lb (107.5 kg)   LMP 03/18/2015 (LMP Unknown)   BMI 38.25 kg/m  FHT: 137 BPM  Uterine Size: 38 cm and size equals dates  Presentations: cephalic  Pelvic Exam: deferred  NST: NST: +  10X10 accels, no decels, moderate variability, No contractions on toco.   Assessment:    Pregnancy @ [redacted]w[redacted]d weeks  CHTN: not on medications  Non-reactive NST  BPP 8/8 in office  Plan:   BPP in office:    Plans for delivery: VBAC planned; labs reviewed; problem list updated Counseling: Consent signed. Infant feeding: plans to breastfeed. Cigarette smoking: never smoked. L&D discussion: symptoms of labor, discussed when to call, discussed what number to call, anesthetic/analgesic options reviewed and delivering clinician:  plans no preference. Postpartum  supports and preparation: circumcision discussed and contraception plans discussed.  Follow up in 1 Week with NST.

## 2015-12-04 NOTE — Progress Notes (Signed)
Patient reports some discomfort in her back. Patient is having some headaches- come and go- Tylenol does not help

## 2015-12-07 ENCOUNTER — Encounter (HOSPITAL_COMMUNITY): Payer: Self-pay | Admitting: *Deleted

## 2015-12-07 ENCOUNTER — Encounter: Payer: Medicaid Other | Admitting: Obstetrics

## 2015-12-07 ENCOUNTER — Telehealth (HOSPITAL_COMMUNITY): Payer: Self-pay | Admitting: *Deleted

## 2015-12-07 NOTE — Telephone Encounter (Signed)
Preadmission screen  

## 2015-12-08 ENCOUNTER — Ambulatory Visit (HOSPITAL_COMMUNITY)
Admission: RE | Admit: 2015-12-08 | Discharge: 2015-12-08 | Disposition: A | Discharge status: Home / Self Care | Payer: Medicaid Other | Source: Ambulatory Visit | Attending: Obstetrics | Admitting: Obstetrics

## 2015-12-08 DIAGNOSIS — Z3A37 37 weeks gestation of pregnancy: Secondary | ICD-10-CM | POA: Insufficient documentation

## 2015-12-08 DIAGNOSIS — O09523 Supervision of elderly multigravida, third trimester: Secondary | ICD-10-CM | POA: Insufficient documentation

## 2015-12-08 DIAGNOSIS — O10913 Unspecified pre-existing hypertension complicating pregnancy, third trimester: Secondary | ICD-10-CM | POA: Insufficient documentation

## 2015-12-08 DIAGNOSIS — O34219 Maternal care for unspecified type scar from previous cesarean delivery: Secondary | ICD-10-CM | POA: Insufficient documentation

## 2015-12-08 DIAGNOSIS — O10919 Unspecified pre-existing hypertension complicating pregnancy, unspecified trimester: Secondary | ICD-10-CM

## 2015-12-08 DIAGNOSIS — O99213 Obesity complicating pregnancy, third trimester: Secondary | ICD-10-CM | POA: Insufficient documentation

## 2015-12-11 ENCOUNTER — Other Ambulatory Visit: Payer: Medicaid Other

## 2015-12-11 ENCOUNTER — Encounter: Payer: Self-pay | Admitting: *Deleted

## 2015-12-11 ENCOUNTER — Ambulatory Visit (INDEPENDENT_AMBULATORY_CARE_PROVIDER_SITE_OTHER): Payer: Medicaid Other | Admitting: Obstetrics

## 2015-12-11 VITALS — BP 147/93 | HR 76 | Wt 236.0 lb

## 2015-12-11 DIAGNOSIS — O09523 Supervision of elderly multigravida, third trimester: Secondary | ICD-10-CM | POA: Diagnosis not present

## 2015-12-11 DIAGNOSIS — O0993 Supervision of high risk pregnancy, unspecified, third trimester: Secondary | ICD-10-CM

## 2015-12-11 DIAGNOSIS — O09529 Supervision of elderly multigravida, unspecified trimester: Secondary | ICD-10-CM

## 2015-12-11 NOTE — Progress Notes (Signed)
Patient is having slight contractions- she reports she did not take her BP medication today. She is inquiring about her FMLA/Disability paperwork

## 2015-12-12 ENCOUNTER — Encounter: Payer: Self-pay | Admitting: Obstetrics

## 2015-12-12 NOTE — Progress Notes (Signed)
Subjective:    Esra A Huizenga is a 40 y.o. female being seen today for her obstetrical visit. She is at [redacted]w[redacted]d gestation. Patient reports backache and occasional contractions. Fetal movement: normal.  Problem List Items Addressed This Visit    None    Visit Diagnoses    High-risk pregnancy in third trimester    -  Primary   Relevant Orders   Fetal nonstress test     Patient Active Problem List   Diagnosis Date Noted  . Supervision of high-risk pregnancy 12/04/2015  . HTN (hypertension) 02/27/2015  . Unintended weight loss 10/27/2014  . Allergic rhinitis 06/23/2014  . Elevated BP 06/23/2014  . 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 Vitamin D insufficiency 03/03/2014  . Pilonidal cyst 03/03/2014  . Broken teeth 03/03/2014  . Poor sleep pattern 03/03/2014  . Obesity (BMI 30-39.9) 03/03/2014  . Current smoker 03/03/2014  . Anemia 05/27/2013    Objective:    BP (!) 147/93   Pulse 76   Wt 236 lb (107 kg)   LMP 03/18/2015 (LMP Unknown)   BMI 38.09 kg/m  FHT: 150 BPM  Uterine Size: size equals dates  Presentations: cephalic    Assessment:    Pregnancy @ [redacted]w[redacted]d weeks   Plan:   Plans for delivery: Vaginal anticipated; labs reviewed; problem list updated Counseling: Consent signed. Infant feeding: plans to breastfeed. Cigarette smoking: former smoker. L&D discussion: symptoms of labor, discussed when to call, discussed what number to call, anesthetic/analgesic options reviewed and delivering clinician:  plans no preference. Postpartum supports and preparation: circumcision discussed and contraception plans discussed.  Follow up in 1 Week.  Patient ID: [name], female   DOB: 17-Apr-1975, 40 y.o.   MRN: [id number]

## 2015-12-15 ENCOUNTER — Inpatient Hospital Stay (HOSPITAL_COMMUNITY)
Admission: AD | Admit: 2015-12-15 | Discharge: 2015-12-15 | Disposition: A | Discharge status: Home / Self Care | Payer: Medicaid Other | Source: Ambulatory Visit | Attending: Family Medicine | Admitting: Family Medicine

## 2015-12-15 ENCOUNTER — Encounter (HOSPITAL_COMMUNITY): Payer: Self-pay | Admitting: *Deleted

## 2015-12-15 DIAGNOSIS — Z87891 Personal history of nicotine dependence: Secondary | ICD-10-CM | POA: Insufficient documentation

## 2015-12-15 DIAGNOSIS — I10 Essential (primary) hypertension: Secondary | ICD-10-CM

## 2015-12-15 DIAGNOSIS — E876 Hypokalemia: Secondary | ICD-10-CM

## 2015-12-15 DIAGNOSIS — O163 Unspecified maternal hypertension, third trimester: Secondary | ICD-10-CM | POA: Insufficient documentation

## 2015-12-15 DIAGNOSIS — Z3A38 38 weeks gestation of pregnancy: Secondary | ICD-10-CM | POA: Insufficient documentation

## 2015-12-15 DIAGNOSIS — O09523 Supervision of elderly multigravida, third trimester: Secondary | ICD-10-CM | POA: Insufficient documentation

## 2015-12-15 DIAGNOSIS — O471 False labor at or after 37 completed weeks of gestation: Secondary | ICD-10-CM | POA: Insufficient documentation

## 2015-12-15 DIAGNOSIS — O0993 Supervision of high risk pregnancy, unspecified, third trimester: Secondary | ICD-10-CM

## 2015-12-15 LAB — COMPREHENSIVE METABOLIC PANEL
ALT: 8 U/L — ABNORMAL LOW (ref 14–54)
AST: 13 U/L — ABNORMAL LOW (ref 15–41)
Albumin: 3.1 g/dL — ABNORMAL LOW (ref 3.5–5.0)
Alkaline Phosphatase: 96 U/L (ref 38–126)
Anion gap: 8 (ref 5–15)
BUN: 5 mg/dL — ABNORMAL LOW (ref 6–20)
CO2: 20 mmol/L — ABNORMAL LOW (ref 22–32)
Calcium: 8.7 mg/dL — ABNORMAL LOW (ref 8.9–10.3)
Chloride: 109 mmol/L (ref 101–111)
Creatinine, Ser: 0.5 mg/dL (ref 0.44–1.00)
GFR calc Af Amer: >60 mL/min (ref >60)
GFR calc non Af Amer: >60 mL/min (ref >60)
Glucose, Bld: 88 mg/dL (ref 65–99)
Potassium: 2.7 mmol/L — CL (ref 3.5–5.1)
Sodium: 137 mmol/L (ref 135–145)
Total Bilirubin: 0.2 mg/dL — ABNORMAL LOW (ref 0.3–1.2)
Total Protein: 6.3 g/dL — ABNORMAL LOW (ref 6.5–8.1)

## 2015-12-15 LAB — CBC
HEMATOCRIT: 32.1 % — AB (ref 36.0–46.0)
HEMOGLOBIN: 10.8 g/dL — AB (ref 12.0–15.0)
MCH: 28.9 pg (ref 26.0–34.0)
MCHC: 33.6 g/dL (ref 30.0–36.0)
MCV: 85.8 fL (ref 78.0–100.0)
Platelets: 155 10*3/uL (ref 150–400)
RBC: 3.74 MIL/uL — AB (ref 3.87–5.11)
RDW: 15.2 % (ref 11.5–15.5)
WBC: 7.7 10*3/uL (ref 4.0–10.5)

## 2015-12-15 MED ORDER — LABETALOL HCL 100 MG PO TABS
200.0000 mg | ORAL_TABLET | Freq: Two times a day (BID) | ORAL | Status: DC
  Administered 2015-12-15: 200 mg via ORAL
  Filled 2015-12-15: qty 2

## 2015-12-15 MED ORDER — POTASSIUM CHLORIDE CRYS ER 20 MEQ PO TBCR
80.0000 meq | EXTENDED_RELEASE_TABLET | Freq: Once | ORAL | Status: AC
  Administered 2015-12-15: 80 meq via ORAL
  Filled 2015-12-15: qty 4

## 2015-12-15 NOTE — Progress Notes (Signed)
CRITICAL VALUE ALERT  Critical value received:  K 2.7  Date of notification:  12/15/2015  Time of notification:  [id number]  Critical value read back:Yes.    Nurse who received alert:  K.Kerney Hopfensperger,rn  MD notified (1st page):  DR. Vanetta Shawl  Time of first page:  [phone]   MD notified (2nd page):  Time of second page:  Responding MD:  Dr. Vanetta Shawl  Time MD responded:  [phone]

## 2015-12-15 NOTE — MAU Provider Note (Signed)
MAU HISTORY AND PHYSICAL  Chief Complaint:  Labor Eval   Emily Saunders is a 40 y.o.  [id number]  at [redacted]w[redacted]d presenting for Labor Eval . Patient states she has been having  irregular, every 5-7 minutes contractions, none vaginal bleeding, intact membranes, with active fetal movement.    Has been having contractions since last night, was unable to sleep. Is scheduled for induction tomorrow due to HTN. Did not take blood pressure medication this morning. Feels well, denies headache, RUQ pain, vision changes, swelling.   Past Medical History:  Diagnosis Date  . H/O pilonidal cyst 2002   . Hypertension   . PID (acute pelvic inflammatory disease) 2006   . Vitamin D deficiency 2015     Past Surgical History:  Procedure Laterality Date  . CESAREAN SECTION    . CYST EXCISION Right 2003   hand    Family History  Problem Relation Age of Onset  . Hypertension Mother   . Diabetes Mother   . Asthma Mother   . COPD Mother   . Depression Mother   . Miscarriages / [place] Mother   . Vision loss Mother   . Heart disease Mother   . Parkinson's disease Father   . Diabetes Maternal Aunt   . Diabetes Maternal Uncle   . Diabetes Cousin     Social History  Substance Use Topics  . Smoking status: Former Smoker    Packs/day: 0.75    Types: Cigarettes  . Smokeless tobacco: Never Used     Comment: Smoking .5 ppd  . Alcohol use No    No Known Allergies  Prescriptions Prior to Admission  Medication Sig Dispense Refill Last Dose  . labetalol (NORMODYNE) 200 MG tablet Take 1 tablet (200 mg total) by mouth 2 (two) times daily. 60 tablet 0 12/14/2015 at 1800  . Prenatal Vit-Fe Fumarate-FA (PRENATAL VITAMIN PO) Take 1 tablet by mouth daily.    12/14/2015 at Unknown time    Review of Systems - Negative except for what is mentioned in HPI.  Physical Exam  Blood pressure 140/79, pulse 73, temperature 97.8 F (36.6 C), temperature source Oral, resp. rate 18, last menstrual period  03/18/2015. GENERAL: Well-developed, well-nourished female in no acute distress.  LUNGS: No respiratory distress HEART: Regular rate ABDOMEN: Soft, nontender, nondistended, gravid abdomen.  EXTREMITIES: Nontender, no edema, 2+ distal pulses. Presentation: cephalic FHT:  Baseline [id number], moderate variability, accelerations present no decelerations noted Contractions: irregular every 10-15 minutes Dilation: 3 Effacement (%): 60 Cervical Position: Anterior Station: -2 Presentation: Vertex Exam by:: K. Andrey Campanile    Labs: Results for orders placed or performed during the hospital encounter of 12/15/15 (from the past 24 hour(s))  CBC   Collection Time: 12/15/15  2:00 PM  Result Value Ref Range   WBC 7.7 4.0 - 10.5 K/uL   RBC 3.74 (L) 3.87 - 5.11 MIL/uL   Hemoglobin 10.8 (L) 12.0 - 15.0 g/dL   HCT 32.1 (L) 36.0 - 46.0 %   MCV 85.8 78.0 - 100.0 fL   MCH 28.9 26.0 - 34.0 pg   MCHC 33.6 30.0 - 36.0 g/dL   RDW 15.2 11.5 - 15.5 %   Platelets 155 150 - 400 K/uL    Imaging Studies:  [place] Mfm Fetal Bpp Wo Non Stress  Result Date: 11/27/2015 OBSTETRICAL ULTRASOUND: This exam was performed within a [place] Ultrasound Department. The OB US report was generated in the AS system, and faxed to the ordering physician.  This report is available  in the [facility]. See the AS Obstetric US report via the Image Link.  [place] Mfm Fetal Bpp Wo Non Stress  Result Date: 11/24/2015 OBSTETRICAL ULTRASOUND: This exam was performed within a [place] Ultrasound Department. The OB US report was generated in the AS system, and faxed to the ordering physician.  This report is available in the [facility]. See the AS Obstetric US report via the Image Link.  [place] Mfm Fetal Bpp Wo Non Stress  Result Date: 11/17/2015 OBSTETRICAL ULTRASOUND: This exam was performed within a [place] Ultrasound Department. The OB US report was generated in the AS system, and faxed to the ordering physician.  This report is  available in the [facility]. See the AS Obstetric US report via the Image Link.  [place] Mfm Ob Follow Up  Result Date: 12/02/2015 OBSTETRICAL ULTRASOUND: This exam was performed within a [place] Ultrasound Department. The OB US report was generated in the AS system, and faxed to the ordering physician.  This report is available in the [facility]. See the AS Obstetric US report via the Image Link.  [place] Mfm Ob Limited  Result Date: 12/08/2015 OBSTETRICAL ULTRASOUND: This exam was performed within a  Ultrasound Department. The OB US report was generated in the AS system, and faxed to the ordering physician.  This report is available in the [facility]. See the AS Obstetric US report via the Image Link.   Assessment: Emily Saunders is  40 y.o. [phone] at [redacted]w[redacted]d presents with Labor Eval . MDM CMP, CBC Urine protein:creatinine ratio  Plan:  Labor eval- not currently in labor FHT reassuring PIH labs normal, blood pressures in normal range after home dose of Labetalol Potassium low- 80 mEq given in MAU  Stable for discharge home Return for IOL at 7:30 tomorrow morning as scheduled   [name], DO PGY-1 11/7/20172:49 PM   OB FELLOW MAU DISCHARGE ATTESTATION  I have seen and examined this patient; I agree with above documentation in the resident's note. Not in labor, nst reactive, no s/s severe disease, BPs mild to moderately elevated. IOL tomorrow   Desma Maxim, MD 3:27 PM

## 2015-12-15 NOTE — Discharge Instructions (Signed)
Before Baby Comes Home °Before your baby arrives it is important to: °· Have all of the supplies that you will need to care for your baby. °· Know where to go if there is an emergency. °· Discuss the baby's arrival with other family members. °WHAT SUPPLIES WILL I NEED? °It is recommended that you have the following supplies: °Large Items °· Crib. °· Crib mattress. °· Rear-facing infant car seat. If possible, have a trained professional check to make sure that it is installed correctly. °Feeding °· 6-8 bottles that are 4-5 oz in size. °· 6-8 nipples. °· Bottle brush. °· Sterilizer, or a large pan or kettle with a lid. °· A way to boil and cool water. °· If you will be breastfeeding: °¨ Breast pump. °¨ Nipple cream. °¨ Nursing bra. °¨ Breast pads. °¨ Breast shields. °· If you will be formula feeding: °¨ Formula. °¨ Measuring cups. °¨ Measuring spoons. °Bathing °· Mild baby soap and baby shampoo. °· Petroleum jelly. °· Soft cloth towel and washcloth. °· Hooded towel. °· Cotton balls. °· Bath basin. °Other Supplies °· Rectal thermometer. °· Bulb syringe. °· Baby wipes or washcloths for diaper changes. °· Diaper bag. °· Changing pad. °· Clothing, including one-piece outfits and pajamas. °· Baby nail clippers. °· Receiving blankets. °· Mattress pad and sheets for the crib. °· Night-light for the baby's room. °· Baby monitor. °· 2 or 3 pacifiers. °· Either 24-36 cloth diapers and waterproof diaper covers or a box of disposable diapers. You may need to use as many as 10-12 diapers per day. °HOW DO I PREPARE FOR AN EMERGENCY? °Prepare for an emergency by: °· Knowing how to get to the nearest hospital. °· Listing the phone numbers of your baby's health care providers near your home phone and in your cell phone. °HOW DO I PREPARE MY FAMILY? °· Decide how to handle visitors. °· If you have other children: °¨ Talk with them about the baby coming home. Ask them how they feel about it. °¨ Read a book together about being a new big  brother or sister. °¨ Find ways to let them help you prepare for the new baby. °¨ Have someone ready to care for them while you are in the hospital. °  °This information is not intended to replace advice given to you by your health care provider. Make sure you discuss any questions you have with your health care provider. °  °Document Released: 01/07/2008 Document Revised: 06/10/2014 Document Reviewed: 01/01/2014 °Elsevier Interactive Patient Education ©2016 [employer] ° °

## 2015-12-15 NOTE — MAU Note (Addendum)
Pt C/O uc's since yesterday @ 0400, have become more intense.  Denies bleeding or LOF.  Hx of chronic HTN, on labetalol, hasn't taken her med today.

## 2015-12-16 ENCOUNTER — Inpatient Hospital Stay (HOSPITAL_COMMUNITY)
Admission: RE | Admit: 2015-12-16 | Discharge: 2015-12-19 | DRG: 774 | Disposition: A | Discharge status: Home / Self Care | Payer: Medicaid Other | Source: Ambulatory Visit | Attending: Family Medicine | Admitting: Family Medicine

## 2015-12-16 ENCOUNTER — Encounter (HOSPITAL_COMMUNITY): Payer: Self-pay

## 2015-12-16 ENCOUNTER — Inpatient Hospital Stay (HOSPITAL_COMMUNITY): Payer: Medicaid Other | Admitting: Anesthesiology

## 2015-12-16 DIAGNOSIS — O34211 Maternal care for low transverse scar from previous cesarean delivery: Secondary | ICD-10-CM | POA: Diagnosis present

## 2015-12-16 DIAGNOSIS — Z6838 Body mass index (BMI) 38.0-38.9, adult: Secondary | ICD-10-CM

## 2015-12-16 DIAGNOSIS — Z8249 Family history of ischemic heart disease and other diseases of the circulatory system: Secondary | ICD-10-CM | POA: Diagnosis not present

## 2015-12-16 DIAGNOSIS — F17200 Nicotine dependence, unspecified, uncomplicated: Secondary | ICD-10-CM | POA: Diagnosis present

## 2015-12-16 DIAGNOSIS — O0993 Supervision of high risk pregnancy, unspecified, third trimester: Secondary | ICD-10-CM

## 2015-12-16 DIAGNOSIS — O10919 Unspecified pre-existing hypertension complicating pregnancy, unspecified trimester: Secondary | ICD-10-CM | POA: Diagnosis present

## 2015-12-16 DIAGNOSIS — N858 Other specified noninflammatory disorders of uterus: Secondary | ICD-10-CM

## 2015-12-16 DIAGNOSIS — Z87891 Personal history of nicotine dependence: Secondary | ICD-10-CM | POA: Diagnosis not present

## 2015-12-16 DIAGNOSIS — Z3A39 39 weeks gestation of pregnancy: Secondary | ICD-10-CM

## 2015-12-16 DIAGNOSIS — O1002 Pre-existing essential hypertension complicating childbirth: Principal | ICD-10-CM | POA: Diagnosis present

## 2015-12-16 DIAGNOSIS — O34219 Maternal care for unspecified type scar from previous cesarean delivery: Secondary | ICD-10-CM | POA: Diagnosis not present

## 2015-12-16 DIAGNOSIS — E669 Obesity, unspecified: Secondary | ICD-10-CM | POA: Diagnosis present

## 2015-12-16 DIAGNOSIS — O0990 Supervision of high risk pregnancy, unspecified, unspecified trimester: Secondary | ICD-10-CM

## 2015-12-16 DIAGNOSIS — Z833 Family history of diabetes mellitus: Secondary | ICD-10-CM | POA: Diagnosis not present

## 2015-12-16 DIAGNOSIS — O403XX0 Polyhydramnios, third trimester, not applicable or unspecified: Secondary | ICD-10-CM

## 2015-12-16 DIAGNOSIS — O99214 Obesity complicating childbirth: Secondary | ICD-10-CM | POA: Diagnosis present

## 2015-12-16 LAB — CBC
HEMATOCRIT: 34.4 % — AB (ref 36.0–46.0)
HEMATOCRIT: 33.1 % — AB (ref 36.0–46.0)
HEMOGLOBIN: 11.6 g/dL — AB (ref 12.0–15.0)
HEMOGLOBIN: 11.1 g/dL — AB (ref 12.0–15.0)
MCH: 29 pg (ref 26.0–34.0)
MCH: 29 pg (ref 26.0–34.0)
MCHC: 33.7 g/dL (ref 30.0–36.0)
MCHC: 33.5 g/dL (ref 30.0–36.0)
MCV: 86 fL (ref 78.0–100.0)
MCV: 86.4 fL (ref 78.0–100.0)
Platelets: 154 10*3/uL (ref 150–400)
Platelets: 132 10*3/uL — ABNORMAL LOW (ref 150–400)
RBC: 4 MIL/uL (ref 3.87–5.11)
RBC: 3.83 MIL/uL — AB (ref 3.87–5.11)
RDW: 15.3 % (ref 11.5–15.5)
RDW: 15.5 % (ref 11.5–15.5)
WBC: 7 10*3/uL (ref 4.0–10.5)
WBC: 6.3 10*3/uL (ref 4.0–10.5)

## 2015-12-16 LAB — PROTEIN / CREATININE RATIO, URINE
CREATININE, URINE: 134 mg/dL
Protein Creatinine Ratio: 0.13 mg/mg{Cre} (ref 0.00–0.15)
Total Protein, Urine: 17 mg/dL

## 2015-12-16 LAB — COMPREHENSIVE METABOLIC PANEL
ALBUMIN: 3.4 g/dL — AB (ref 3.5–5.0)
ALK PHOS: 106 U/L (ref 38–126)
ALT: 10 U/L — ABNORMAL LOW (ref 14–54)
AST: 17 U/L (ref 15–41)
Anion gap: 8 (ref 5–15)
BILIRUBIN TOTAL: 0.4 mg/dL (ref 0.3–1.2)
BUN: 5 mg/dL — AB (ref 6–20)
CALCIUM: 8.9 mg/dL (ref 8.9–10.3)
CO2: 21 mmol/L — ABNORMAL LOW (ref 22–32)
CREATININE: 0.58 mg/dL (ref 0.44–1.00)
Chloride: 108 mmol/L (ref 101–111)
GFR calc Af Amer: >60 mL/min (ref >60)
GLUCOSE: 86 mg/dL (ref 65–99)
POTASSIUM: 3.5 mmol/L (ref 3.5–5.1)
Sodium: 137 mmol/L (ref 135–145)
TOTAL PROTEIN: 6.6 g/dL (ref 6.5–8.1)

## 2015-12-16 LAB — TYPE AND SCREEN
ABO/RH(D): A POS
Antibody Screen: NEGATIVE

## 2015-12-16 LAB — RPR: RPR Ser Ql: NONREACTIVE

## 2015-12-16 MED ORDER — ONDANSETRON HCL 4 MG PO TABS: 4.0000 mg | ORAL_TABLET | ORAL | Status: DC | PRN

## 2015-12-16 MED ORDER — OXYTOCIN 40 UNITS IN LACTATED RINGERS INFUSION - SIMPLE MED: 1.0000 m[IU]/min | INTRAVENOUS | Status: DC

## 2015-12-16 MED ORDER — ACETAMINOPHEN 325 MG PO TABS: 650.0000 mg | ORAL_TABLET | ORAL | Status: DC | PRN

## 2015-12-16 MED ORDER — EPHEDRINE 5 MG/ML INJ
10.0000 mg | INTRAVENOUS | Status: DC | PRN
  Filled 2015-12-16: qty 4

## 2015-12-16 MED ORDER — LIDOCAINE HCL (PF) 1 % IJ SOLN
30.0000 mL | INTRAMUSCULAR | Status: DC | PRN
  Filled 2015-12-16: qty 30

## 2015-12-16 MED ORDER — FENTANYL 2.5 MCG/ML BUPIVACAINE 1/10 % EPIDURAL INFUSION (WH - ANES)
14.0000 mL/h | INTRAMUSCULAR | Status: DC | PRN
  Administered 2015-12-16 (×2): 14 mL/h via EPIDURAL

## 2015-12-16 MED ORDER — LACTATED RINGERS IV SOLN
500.0000 mL | Freq: Once | INTRAVENOUS | Status: AC
  Administered 2015-12-16: 500 mL via INTRAVENOUS

## 2015-12-16 MED ORDER — OXYTOCIN BOLUS FROM INFUSION
500.0000 mL | Freq: Once | INTRAVENOUS | Status: AC
  Administered 2015-12-16: 500 mL via INTRAVENOUS

## 2015-12-16 MED ORDER — ONDANSETRON HCL 4 MG/2ML IJ SOLN: 4.0000 mg | INTRAMUSCULAR | Status: DC | PRN

## 2015-12-16 MED ORDER — DIPHENHYDRAMINE HCL 25 MG PO CAPS: 25.0000 mg | ORAL_CAPSULE | Freq: Four times a day (QID) | ORAL | Status: DC | PRN

## 2015-12-16 MED ORDER — SENNOSIDES-DOCUSATE SODIUM 8.6-50 MG PO TABS
2.0000 | ORAL_TABLET | ORAL | Status: DC
  Administered 2015-12-16 – 2015-12-18 (×2): 2 via ORAL
  Filled 2015-12-16 (×3): qty 2

## 2015-12-16 MED ORDER — LACTATED RINGERS IV SOLN
500.0000 mL | INTRAVENOUS | Status: DC | PRN
  Administered 2015-12-16: 500 mL via INTRAVENOUS

## 2015-12-16 MED ORDER — PHENYLEPHRINE 40 MCG/ML (10ML) SYRINGE FOR IV PUSH (FOR BLOOD PRESSURE SUPPORT)
80.0000 ug | PREFILLED_SYRINGE | INTRAVENOUS | Status: DC | PRN
  Filled 2015-12-16: qty 5

## 2015-12-16 MED ORDER — BENZOCAINE-MENTHOL 20-0.5 % EX AERO: 1.0000 "application " | INHALATION_SPRAY | CUTANEOUS | Status: DC | PRN

## 2015-12-16 MED ORDER — FENTANYL 2.5 MCG/ML BUPIVACAINE 1/10 % EPIDURAL INFUSION (WH - ANES)
INTRAMUSCULAR | Status: AC
  Filled 2015-12-16: qty 100

## 2015-12-16 MED ORDER — WITCH HAZEL-GLYCERIN EX PADS: 1.0000 "application " | MEDICATED_PAD | CUTANEOUS | Status: DC | PRN

## 2015-12-16 MED ORDER — ONDANSETRON HCL 4 MG/2ML IJ SOLN: 4.0000 mg | Freq: Four times a day (QID) | INTRAMUSCULAR | Status: DC | PRN

## 2015-12-16 MED ORDER — FENTANYL 2.5 MCG/ML BUPIVACAINE 1/10 % EPIDURAL INFUSION (WH - ANES): 14.0000 mL/h | INTRAMUSCULAR | Status: DC | PRN

## 2015-12-16 MED ORDER — OXYTOCIN 40 UNITS IN LACTATED RINGERS INFUSION - SIMPLE MED
2.5000 [IU]/h | INTRAVENOUS | Status: DC
  Filled 2015-12-16: qty 1000

## 2015-12-16 MED ORDER — LACTATED RINGERS IV SOLN
INTRAVENOUS | Status: DC
  Administered 2015-12-16 (×2): via INTRAVENOUS

## 2015-12-16 MED ORDER — LIDOCAINE HCL (PF) 1 % IJ SOLN
INTRAMUSCULAR | Status: DC | PRN
  Administered 2015-12-16 (×2): 4 mL

## 2015-12-16 MED ORDER — OXYTOCIN 40 UNITS IN LACTATED RINGERS INFUSION - SIMPLE MED
1.0000 m[IU]/min | INTRAVENOUS | Status: DC
  Administered 2015-12-16: 2 m[IU]/min via INTRAVENOUS

## 2015-12-16 MED ORDER — IBUPROFEN 600 MG PO TABS
600.0000 mg | ORAL_TABLET | Freq: Four times a day (QID) | ORAL | Status: DC
  Administered 2015-12-16 – 2015-12-19 (×11): 600 mg via ORAL
  Filled 2015-12-16 (×11): qty 1

## 2015-12-16 MED ORDER — FENTANYL CITRATE (PF) 100 MCG/2ML IJ SOLN: 100.0000 ug | INTRAMUSCULAR | Status: DC | PRN

## 2015-12-16 MED ORDER — PHENYLEPHRINE 40 MCG/ML (10ML) SYRINGE FOR IV PUSH (FOR BLOOD PRESSURE SUPPORT)
PREFILLED_SYRINGE | INTRAVENOUS | Status: AC
  Filled 2015-12-16: qty 20

## 2015-12-16 MED ORDER — TERBUTALINE SULFATE 1 MG/ML IJ SOLN
0.2500 mg | Freq: Once | INTRAMUSCULAR | Status: DC | PRN
  Filled 2015-12-16: qty 1

## 2015-12-16 MED ORDER — DIPHENHYDRAMINE HCL 50 MG/ML IJ SOLN
12.5000 mg | INTRAMUSCULAR | Status: DC | PRN
  Administered 2015-12-16 (×2): 12.5 mg via INTRAVENOUS
  Filled 2015-12-16: qty 1

## 2015-12-16 MED ORDER — SOD CITRATE-CITRIC ACID 500-334 MG/5ML PO SOLN: 30.0000 mL | ORAL | Status: DC | PRN

## 2015-12-16 MED ORDER — PRENATAL MULTIVITAMIN CH
1.0000 | ORAL_TABLET | Freq: Every day | ORAL | Status: DC
  Administered 2015-12-17 – 2015-12-19 (×3): 1 via ORAL
  Filled 2015-12-16 (×3): qty 1

## 2015-12-16 MED ORDER — ZOLPIDEM TARTRATE 5 MG PO TABS: 5.0000 mg | ORAL_TABLET | Freq: Every evening | ORAL | Status: DC | PRN

## 2015-12-16 MED ORDER — TETANUS-DIPHTH-ACELL PERTUSSIS 5-2.5-18.5 LF-MCG/0.5 IM SUSP: 0.5000 mL | Freq: Once | INTRAMUSCULAR | Status: DC

## 2015-12-16 MED ORDER — LABETALOL HCL 200 MG PO TABS
200.0000 mg | ORAL_TABLET | Freq: Two times a day (BID) | ORAL | Status: DC
  Administered 2015-12-16 – 2015-12-19 (×7): 200 mg via ORAL
  Filled 2015-12-16 (×9): qty 1

## 2015-12-16 MED ORDER — COCONUT OIL OIL
1.0000 "application " | TOPICAL_OIL | Status: DC | PRN
  Administered 2015-12-18: 1 via TOPICAL
  Filled 2015-12-16 (×2): qty 120

## 2015-12-16 MED ORDER — DIBUCAINE 1 % RE OINT: 1.0000 "application " | TOPICAL_OINTMENT | RECTAL | Status: DC | PRN

## 2015-12-16 MED ORDER — SIMETHICONE 80 MG PO CHEW: 80.0000 mg | CHEWABLE_TABLET | ORAL | Status: DC | PRN

## 2015-12-16 NOTE — Anesthesia Postprocedure Evaluation (Signed)
Anesthesia Post Note  Patient: Emily Saunders  Procedure(s) Performed: * No procedures listed *  Patient location during evaluation: Mother Baby Anesthesia Type: Epidural Level of consciousness: awake and alert Pain management: pain level controlled Vital Signs Assessment: post-procedure vital signs reviewed and stable Respiratory status: spontaneous breathing, nonlabored ventilation and respiratory function stable Cardiovascular status: stable Postop Assessment: no headache, no backache and epidural receding Anesthetic complications: no     Last Vitals:  Vitals:   12/16/15 1755 12/16/15 1927  BP: 140/80 139/81  Pulse: 65 66  Resp: 16 (!) 22  Temp: 36.9 C 36.7 C    Last Pain:  Vitals:   12/16/15 1927  TempSrc: Oral  PainSc: 0-No pain   Pain Goal: Patients Stated Pain Goal: 8 (12/16/15 1130)               Gilmer Mor

## 2015-12-16 NOTE — Anesthesia Procedure Notes (Signed)
Epidural Patient location during procedure: OB  Staffing Anesthesiologist: Nolon Nations Performed: anesthesiologist   Preanesthetic Checklist Completed: patient identified, pre-op evaluation, timeout performed, IV checked, risks and benefits discussed and monitors and equipment checked  Epidural Patient position: sitting Prep: site prepped and draped and DuraPrep Patient monitoring: heart rate Approach: midline Location: L3-L4 Injection technique: LOR air and LOR saline  Needle:  Needle type: Tuohy  Needle gauge: 17 G Needle length: 9 cm Needle insertion depth: 7 cm Catheter type: closed end flexible Catheter size: 19 Gauge Catheter at skin depth: 13 cm Test dose: negative  Assessment Sensory level: T8 Events: blood not aspirated, injection not painful, no injection resistance, negative IV test and no paresthesia  Additional Notes Reason for block:procedure for pain

## 2015-12-16 NOTE — Lactation Note (Signed)
This note was copied from a baby's chart. Lactation Consultation Note  Patient Name: Emily Saunders [id number] Date: 12/16/2015 Reason for consult: Initial assessment   Initial assessment with Exp BF mom of > 1 hour old infant. Mom reports she did not BF her 1st and 3rd child by choice, she BF her 2nd child for 22 months. Infant was latched STS and feeding when I went into room.  Mom with large pendulous compressible breasts with semi-compressible areola and everted nipples. Infant was latched in the cradle hold to the left breast. Mom was giving breathing space. Worked with mom to reposition infant so that she does not have to give breathing space. Mom continued to give breathing space voicing she is concerned she will suffocate infant. Mom kept wanting to take infant off the breast even though infant was still feeding. Enc her to allow infant to feed as often and as long as she wants. Discussed colostrum and milk coming to volume with mom.  BF basics reviewed with mom. Enc her to feed infant 8-12 x in 24 hours at first feeding cues. Enc mom to massage/compress breast with feeding. Showed mom how to hand express, no colostrum was noted to left breast. Enc mom to hand express prior to latch and after feeding to apply to nipples. Enc mom to use pillow and head support with feeding.  Feeding log given with instructions for use. BF Resources Handout and [employer] Brochure given, mom informed of IP/OP Services, BF Support Groups and [facility] [place] phone #. Enc mom to call out to desk for feeding assistance as needed. Mom is a [facility] client and does not have a pump at home. Follow up tomorrow and prn.        Maternal Data Formula Feeding for Exclusion: No Has patient been taught Hand Expression?: Yes Does the patient have breastfeeding experience prior to this delivery?: Yes  Feeding Feeding Type: Breast Fed Length of feed: 15 min  LATCH Score/Interventions Latch: Grasps breast easily, tongue down, lips flanged,  rhythmical sucking. Intervention(s): Adjust position;Assist with latch;Breast massage;Breast compression  Audible Swallowing: Spontaneous and intermittent  Type of Nipple: Everted at rest and after stimulation  Comfort (Breast/Nipple): Soft / non-tender     Hold (Positioning): Assistance needed to correctly position infant at breast and maintain latch. Intervention(s): Breastfeeding basics reviewed;Support Pillows;Position options;Skin to skin  LATCH Score: 9  Lactation Tools Discussed/Used WIC Program: Yes   Consult Status Consult Status: Follow-up Date: 12/17/15 Follow-up type: In-patient    Debby Freiberg Naz Denunzio 12/16/2015, 4:05 PM

## 2015-12-16 NOTE — Anesthesia Preprocedure Evaluation (Signed)
Anesthesia Evaluation  Patient identified by MRN, date of birth, ID band Patient awake    Reviewed: Allergy & Precautions, NPO status , Patient's Chart, lab work & pertinent test results  Airway Mallampati: II  TM Distance: >3 FB Neck ROM: Full    Dental no notable dental hx.    Pulmonary neg pulmonary ROS, former smoker,    Pulmonary exam normal breath sounds clear to auscultation       Cardiovascular hypertension, Normal cardiovascular exam Rhythm:Regular Rate:Normal     Neuro/Psych negative neurological ROS  negative psych ROS   GI/Hepatic negative GI ROS, Neg liver ROS,   Endo/Other  negative endocrine ROS  Renal/GU negative Renal ROS     Musculoskeletal negative musculoskeletal ROS (+)   Abdominal (+) + obese,   Peds  Hematology  (+) anemia ,   Anesthesia Other Findings   Reproductive/Obstetrics (+) Pregnancy                             Anesthesia Physical Anesthesia Plan  ASA: III  Anesthesia Plan: Epidural   Post-op Pain Management:    Induction:   Airway Management Planned:   Additional Equipment:   Intra-op Plan:   Post-operative Plan:   Informed Consent: I have reviewed the patients History and Physical, chart, labs and discussed the procedure including the risks, benefits and alternatives for the proposed anesthesia with the patient or authorized representative who has indicated his/her understanding and acceptance.     Plan Discussed with:   Anesthesia Plan Comments:         Anesthesia Quick Evaluation

## 2015-12-16 NOTE — H&P (Signed)
LABOR AND DELIVERY ADMISSION HISTORY AND PHYSICAL NOTE  Emily Saunders is a 40 y.o. female [phone] with IUP at [redacted]w[redacted]d by LMP presenting for IOL for chronic hypertension and polyhydramnios.   She reports positive fetal movement. She denies leakage of fluid or vaginal bleeding.  Has been contracting steadily for past 36 hours. Is mildly uncomfortable with contractions. No other complaints at this time.   Prenatal History/Complications:  Past Medical History: Past Medical History:  Diagnosis Date  . H/O pilonidal cyst 2002   . Hypertension   . PID (acute pelvic inflammatory disease) 2006   . Vitamin D deficiency 2015     Past Surgical History: Past Surgical History:  Procedure Laterality Date  . CESAREAN SECTION    . CYST EXCISION Right 2003   hand    Obstetrical History: OB History    Gravida Para Term Preterm AB Living   6 3 3   2 3    SAB TAB Ectopic Multiple Live Births   1   1   3       Social History: Social History   Social History  . Marital status: Single    Spouse name: N/A  . Number of children: N/A  . Years of education: N/A   Social History Main Topics  . Smoking status: Former Smoker    Packs/day: 0.75    Types: Cigarettes  . Smokeless tobacco: Never Used     Comment: Smoking .5 ppd  . Alcohol use No  . Drug use: No  . Sexual activity: Yes    Partners: Male    Birth control/ protection: None     Comment: not preventing pregnancy   Other Topics Concern  . Not on file   Social History Narrative   Works at [employer].   Up at 6 AM.    Gets 4 hs of sleep off and on.     Family History: Family History  Problem Relation Age of Onset  . Hypertension Mother   . Diabetes Mother   . Asthma Mother   . COPD Mother   . Depression Mother   . Miscarriages / [place] Mother   . Vision loss Mother   . Heart disease Mother   . Parkinson's disease Father   . Diabetes Maternal Aunt   . Diabetes Maternal Uncle   . Diabetes Cousin      Allergies: No Known Allergies  Prescriptions Prior to Admission  Medication Sig Dispense Refill Last Dose  . diphenhydramine-acetaminophen (TYLENOL PM) 25-500 MG TABS tablet Take 2 tablets by mouth at bedtime as needed (sleep).   12/15/2015 at Unknown time  . labetalol (NORMODYNE) 200 MG tablet Take 1 tablet (200 mg total) by mouth 2 (two) times daily. 60 tablet 0 12/15/2015 at 1400  . Prenatal Vit-Fe Fumarate-FA (PRENATAL VITAMIN PO) Take 1 tablet by mouth daily.    12/15/2015 at Unknown time     Review of Systems   All systems reviewed and negative except as stated in HPI  Blood pressure (!) 149/93, pulse 68, temperature 98 F (36.7 C), temperature source Oral, resp. rate 18, height 5\' 6"  (1.676 m), weight 107.5 kg (237 lb), last menstrual period 03/18/2015. General appearance: alert, cooperative and no distress Lungs: no respiratory distress Heart: regular rate Abdomen: soft, non-tender Extremities: No calf swelling or tenderness Presentation: cephalic by nurse exam Fetal monitoring: baseline 140, moderate variability, accelerations present no decelerations Uterine activity: none by toco    Prenatal labs: ABO, Rh: A/Positive/-- (05/11 1535) Antibody:  Negative (05/11 1535) Rubella: immune RPR: Non Reactive (08/24 1054)  HBsAg: Negative (05/11 1535)  HIV: Non Reactive (08/24 1054)  GBS: Negative (10/13 1156)   Prenatal Transfer Tool  Maternal Diabetes: No Genetic Screening: Normal Maternal Ultrasounds/Referrals: Normal Fetal Ultrasounds or other Referrals:  Referred to Materal Fetal Medicine  Maternal Substance Abuse:  Yes:  Type: Smoker Significant Maternal Medications:  Meds include: Other: Labetalol 200 mg BID Significant Maternal Lab Results: None  Results for orders placed or performed during the hospital encounter of 12/16/15 (from the past 24 hour(s))  CBC   Collection Time: 12/16/15  8:00 AM  Result Value Ref Range   WBC 7.0 4.0 - 10.5 K/uL   RBC 4.00  3.87 - 5.11 MIL/uL   Hemoglobin 11.6 (L) 12.0 - 15.0 g/dL   HCT 34.4 (L) 36.0 - 46.0 %   MCV 86.0 78.0 - 100.0 fL   MCH 29.0 26.0 - 34.0 pg   MCHC 33.7 30.0 - 36.0 g/dL   RDW 15.3 11.5 - 15.5 %   Platelets 154 150 - 400 K/uL  Results for orders placed or performed during the hospital encounter of 12/15/15 (from the past 24 hour(s))  CBC   Collection Time: 12/15/15  2:00 PM  Result Value Ref Range   WBC 7.7 4.0 - 10.5 K/uL   RBC 3.74 (L) 3.87 - 5.11 MIL/uL   Hemoglobin 10.8 (L) 12.0 - 15.0 g/dL   HCT 32.1 (L) 36.0 - 46.0 %   MCV 85.8 78.0 - 100.0 fL   MCH 28.9 26.0 - 34.0 pg   MCHC 33.6 30.0 - 36.0 g/dL   RDW 15.2 11.5 - 15.5 %   Platelets 155 150 - 400 K/uL  Comprehensive metabolic panel   Collection Time: 12/15/15  2:00 PM  Result Value Ref Range   Sodium 137 135 - 145 mmol/L   Potassium 2.7 (LL) 3.5 - 5.1 mmol/L   Chloride 109 101 - 111 mmol/L   CO2 20 (L) 22 - 32 mmol/L   Glucose, Bld 88 65 - 99 mg/dL   BUN 5 (L) 6 - 20 mg/dL   Creatinine, Ser 0.50 0.44 - 1.00 mg/dL   Calcium 8.7 (L) 8.9 - 10.3 mg/dL   Total Protein 6.3 (L) 6.5 - 8.1 g/dL   Albumin 3.1 (L) 3.5 - 5.0 g/dL   AST 13 (L) 15 - 41 U/L   ALT 8 (L) 14 - 54 U/L   Alkaline Phosphatase 96 38 - 126 U/L   Total Bilirubin 0.2 (L) 0.3 - 1.2 mg/dL   GFR calc non Af Amer >60 >60 mL/min   GFR calc Af Amer >60 >60 mL/min   Anion gap 8 5 - 15    Patient Active Problem List   Diagnosis Date Noted  . Chronic hypertension affecting pregnancy 12/16/2015  . Supervision of high-risk pregnancy 12/04/2015  . HTN (hypertension) 02/27/2015  . Unintended weight loss 10/27/2014  . Allergic rhinitis 06/23/2014  . Elevated BP 06/23/2014  . 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 Vitamin D insufficiency 03/03/2014  . Pilonidal cyst 03/03/2014  . Broken teeth 03/03/2014  . Poor sleep pattern 03/03/2014  . Obesity (BMI 30-39.9) 03/03/2014  . Current smoker 03/03/2014  . Anemia 05/27/2013    Assessment: Emily Saunders is  a 40 y.o. [id number] at [redacted]w[redacted]d here for IOL for chronic hypertension and polyhydramnios, she is AMA. She is a VBAC (has had 2 successful vaginal births after cesarean).  #Labor: IOL with pitocin, will augment as needed  as labor progresses #Pain: Desires epidural #FWB: Category 1 tracing #ID:  GBS negative #MOF: breast #MOC: depo then BTL #Circ:  n/a  [name], DO PGY-1 11/8/20179:15 AM  OB FELLOW HISTORY AND PHYSICAL ATTESTATION  I have seen and examined this patient; I agree with above documentation in the resident's note.    Katherine Basset, DO OB Fellow 12/16/2015, 9:30 AM

## 2015-12-17 NOTE — Lactation Note (Signed)
This note was copied from a baby's chart. Lactation Consultation Note  Patient Name: Emily Saunders [id number] Date: 12/17/2015 Reason for consult: Follow-up assessment Baby at 24 hr of life. Mom is worried that baby is not "getting anything" from the L breast. She is wearing a shell on the L breast only. She stated that baby does latch well to the L. She stated that her breast "are larger than average" and she is having a hard time holding the baby and the breast. She denies breast or nipple pain. Baby was sleeping sts and mom stated baby "just ate". Left lactation number on the board for mom to call at next feeding. Discussed how mom can monitor swallows while baby is latched and reviewed some positioning/support pillow options. Discussed baby behavior, feeding frequency, baby belly size, voids, wt loss, breast changes, and nipple care. Mom is aware of lactation services and support group. She will call as needed.     Maternal Data    Feeding    LATCH Score/Interventions                      Lactation Tools Discussed/Used     Consult Status Consult Status: Follow-up Date: 12/18/15 Follow-up type: In-patient    Denzil Hughes 12/17/2015, 4:13 PM

## 2015-12-17 NOTE — Lactation Note (Signed)
This note was copied from a baby's chart. Lactation Consultation Note  Patient Name: Emily Saunders [id number] Date: 12/17/2015 Reason for consult: Follow-up assessment Baby at 26 hr of life. Mom requested latch help. Upon entry baby was latched comfortably to the R breast. Mom was reluctant but did accept help latching baby the the L breast when she came off the R breast. Had mom lean back in the bed. Showed mom how to bring baby to the breat and support the breat so that baby is not laying under the weight of her breast tissue. Baby latched easily and comfortably. Mom stated this is not how the L side normally goes. Mom has little confidence that she can latch baby to the L side well without help. Praised mom's efforts and encouraged her to continue using the L breast. Reviewed engorgement treatment/prevention and the option of pumping if baby stops latching to the L side. Mom will offer both breast on demand 8+/24hr and call for help as needed.     Maternal Data    Feeding Feeding Type: Breast Fed Length of feed: 20 min  LATCH Score/Interventions Latch: Grasps breast easily, tongue down, lips flanged, rhythmical sucking. Intervention(s): Adjust position;Assist with latch;Breast massage;Breast compression  Audible Swallowing: A few with stimulation Intervention(s): Alternate breast massage  Type of Nipple: Everted at rest and after stimulation  Comfort (Breast/Nipple): Soft / non-tender     Hold (Positioning): Full assist, staff holds infant at breast Intervention(s): Support Pillows;Position options  LATCH Score: 7  Lactation Tools Discussed/Used     Consult Status Consult Status: Follow-up Date: 12/18/15 Follow-up type: In-patient    Denzil Hughes 12/17/2015, 5:31 PM

## 2015-12-17 NOTE — Progress Notes (Signed)
Post Partum Day #1 Subjective: no complaints, up ad lib, voiding and tolerating PO  Objective: Blood pressure 106/83, pulse 68, temperature 98.1 F (36.7 C), temperature source Oral, resp. rate 18, height 5\' 6"  (1.676 m), weight 237 lb (107.5 kg), last menstrual period 03/18/2015, SpO2 100 %, unknown if currently breastfeeding.  Physical Exam:  General: alert, cooperative and no distress Lochia: appropriate Uterine Fundus: firm Incision: none DVT Evaluation: No evidence of DVT seen on physical exam. No cords or calf tenderness. No significant calf/ankle edema.   Recent Labs  12/16/15 0800 12/16/15 1611  HGB 11.6* 11.1*  HCT 34.4* 33.1*    Assessment/Plan: Plan for discharge tomorrow, Breastfeeding, Lactation consult and Contraception depo and interval BTL   LOS: 1 day   Morene Crocker, CNM 12/17/2015, 8:45 AM

## 2015-12-17 NOTE — Progress Notes (Signed)
UR chart review completed.  

## 2015-12-18 ENCOUNTER — Encounter: Payer: Medicaid Other | Admitting: Obstetrics

## 2015-12-18 LAB — BIRTH TISSUE RECOVERY COLLECTION (PLACENTA DONATION)

## 2015-12-18 MED ORDER — SENNOSIDES-DOCUSATE SODIUM 8.6-50 MG PO TABS: 2.0000 | ORAL_TABLET | Freq: Two times a day (BID) | ORAL | 2 refills | Status: DC

## 2015-12-18 MED ORDER — AMLODIPINE BESYLATE 10 MG PO TABS: 10.0000 mg | ORAL_TABLET | Freq: Every day | ORAL | 6 refills | Status: DC

## 2015-12-18 MED ORDER — IBUPROFEN 600 MG PO TABS: 600.0000 mg | ORAL_TABLET | Freq: Four times a day (QID) | ORAL | 2 refills | Status: DC

## 2015-12-18 MED ORDER — AMLODIPINE BESYLATE 10 MG PO TABS
10.0000 mg | ORAL_TABLET | Freq: Every day | ORAL | Status: DC
  Administered 2015-12-18 – 2015-12-19 (×2): 10 mg via ORAL
  Filled 2015-12-18 (×4): qty 1

## 2015-12-18 MED ORDER — OXYCODONE-ACETAMINOPHEN 5-325 MG PO TABS: 1.0000 | ORAL_TABLET | ORAL | 0 refills | Status: DC | PRN

## 2015-12-18 NOTE — Progress Notes (Signed)
Post Partum Day #2 Subjective: no complaints, up ad lib, voiding and tolerating PO  Objective: Blood pressure (!) 158/80, pulse 62, temperature 98.2 F (36.8 C), temperature source Oral, resp. rate 16, height 5\' 6"  (1.676 m), weight 237 lb (107.5 kg), last menstrual period 03/18/2015, SpO2 100 %, unknown if currently breastfeeding.  Physical Exam:  General: alert, cooperative and no distress Lochia: appropriate Uterine Fundus: firm Incision: none DVT Evaluation: No evidence of DVT seen on physical exam. No cords or calf tenderness. No significant calf/ankle edema.   Recent Labs  12/16/15 0800 12/16/15 1611  HGB 11.6* 11.1*  HCT 34.4* 33.1*    Assessment/Plan: Breastfeeding and Contraception planning Depo then interval BTL  Elevated blood pressures: hx of CHTN.  Norvasc started for elevated blood pressures.     LOS: 2 days   Morene Crocker, CNM 12/18/2015, 8:49 AM

## 2015-12-18 NOTE — Lactation Note (Signed)
This note was copied from a baby's chart. Lactation Consultation Note  Patient Name: Emily Saunders [id number] Date: 12/18/2015 Reason for consult: Follow-up assessment Baby at 1 hr of life. Mom is worried that baby is not "getting full". Mom reports baby has been cluster feeding and fussing all day. Instead of a bottle mom was agreeable to using a 57Fr with Alimentum at the breast. Baby took 10 ml then fell asleep. Encouraged mom to keep bf, this will get better has her milk continues to change. Showed mom how to clean the tubing so that she can use it again if she needs. Encouraged her to stop using the pacifier and bf on demand 8+/24hr. Discussed baby behavior, feeding frequency, baby belly size, voids, wt loss, pumping, and breast changes. Mom has Harmony in the room and can manually express. Suggested that she offer her expressed milk if baby is sleepy at the breast. She is aware of lactation services and support group. She will call as needed.     Maternal Data    Feeding Feeding Type: Breast Fed  LATCH Score/Interventions Latch: Grasps breast easily, tongue down, lips flanged, rhythmical sucking. Intervention(s): Breast compression;Breast massage  Audible Swallowing: Spontaneous and intermittent  Type of Nipple: Everted at rest and after stimulation  Comfort (Breast/Nipple): Soft / non-tender     Hold (Positioning): Assistance needed to correctly position infant at breast and maintain latch. Intervention(s): Position options  LATCH Score: 9  Lactation Tools Discussed/Used Tools: 57F feeding tube / Syringe   Consult Status Consult Status: Follow-up Date: 12/19/15 Follow-up type: In-patient    Denzil Hughes 12/18/2015, 7:16 PM

## 2015-12-18 NOTE — Lactation Note (Signed)
This note was copied from a baby's chart. Lactation Consultation Note: Assisted mom with latch to left breast. She has been wearing her shell on that breast and nipple is more erect. Baby easily latched and nursed for 5 min then off to sleep. Several visitors present and they want to hold baby. Encouraged to continue with shells. No further questions Patient Name: Emily Saunders [id number] Date: 12/18/2015 Reason for consult: Follow-up assessment   Maternal Data Formula Feeding for Exclusion: No Has patient been taught Hand Expression?: Yes Does the patient have breastfeeding experience prior to this delivery?: Yes  Feeding Feeding Type: Breast Fed Length of feed: 5 min  LATCH Score/Interventions Latch: Grasps breast easily, tongue down, lips flanged, rhythmical sucking.  Audible Swallowing: A few with stimulation  Type of Nipple: Everted at rest and after stimulation  Comfort (Breast/Nipple): Soft / non-tender     Hold (Positioning): Assistance needed to correctly position infant at breast and maintain latch. Intervention(s): Breastfeeding basics reviewed  LATCH Score: 8  Lactation Tools Discussed/Used WIC Program: Yes   Consult Status Consult Status: Complete    Truddie Crumble 12/18/2015, 11:41 AM

## 2015-12-18 NOTE — Discharge Summary (Signed)
Obstetric Discharge Summary Reason for Admission: induction of labor and for polyhydramnios and CHTN, at 39 wks Prenatal Procedures: NST and ultrasound Intrapartum Procedures: spontaneous vaginal delivery Postpartum Procedures: none Complications-Operative and Postpartum: none Hemoglobin  Date Value Ref Range Status  12/16/2015 11.1 (L) 12.0 - 15.0 g/dL Final   HCT  Date Value Ref Range Status  12/16/2015 33.1 (L) 36.0 - 46.0 % Final   Hematocrit  Date Value Ref Range Status  10/01/2015 33.4 (L) 34.0 - 46.6 % Final    Physical Exam:  General: alert, cooperative and no distress Lochia: appropriate Uterine Fundus: firm Incision: none DVT Evaluation: No evidence of DVT seen on physical exam. No cords or calf tenderness. No significant calf/ankle edema.  Discharge Diagnoses: Term Pregnancy-delivered and AMA, CHTN  Discharge Information: Date: 12/18/2015 Activity: pelvic rest Diet: routine Medications: PNV, Ibuprofen, Colace and Percocet Condition: stable Instructions: refer to practice specific booklet Discharge to: home Follow-up Information    Emily Saunders A Emily Saunders, CNM Follow up in 1 week(s).   Specialty:  Certified Nurse Midwife Why:  blood pressure check Contact information: [place] [address] [phone]           Newborn Data: Live born female  Birth Weight: 6 lb 15.6 oz (3165 g) APGAR: 8, 9  Home with mother.  Emily Saunders, CNM 12/18/2015, 8:54 AM

## 2015-12-18 NOTE — Lactation Note (Signed)
This note was copied from a baby's chart. Lactation Consultation Note: experienced BF mom- that baby is 40 years old. Reports baby is latching well to right breast but is having some trouble getting latched to left breast. Baby last fed about 1 hour ago. Reports she fed a lot through the night. Reassurance given. Left nipple is not as erect as right. Reports wearing shells and they are helping. Has not had them on this morning. Baby waking- attempted to latch to left breast- mom wants to use cradle hold. Baby got fussy and mom switched her to right breast. Nursed for 10 min then going off to sleep. Attempted to latch to left but too sleepy. Mom reports she has latched to left breast a few times. Encouraged to continue trying. Has manual pump to erect nipple- has not used it. Reviewed engorgement prevention and treatment. Encouraged to page for assist with left breast when baby wakes. Asking about pumping for going back to work. Reviewed milk storage guidelines and to pump after nursing. No further questions at present. To call prn  Patient Name: Emily Saunders [id number] Date: 12/18/2015 Reason for consult: Follow-up assessment   Maternal Data Formula Feeding for Exclusion: No Has patient been taught Hand Expression?: Yes Does the patient have breastfeeding experience prior to this delivery?: Yes  Feeding Feeding Type: Breast Fed Length of feed: 10 min  LATCH Score/Interventions Latch: Grasps breast easily, tongue down, lips flanged, rhythmical sucking.  Audible Swallowing: A few with stimulation  Type of Nipple: Everted at rest and after stimulation  Comfort (Breast/Nipple): Soft / non-tender     Hold (Positioning): Assistance needed to correctly position infant at breast and maintain latch. Intervention(s): Breastfeeding basics reviewed  LATCH Score: 8  Lactation Tools Discussed/Used WIC Program: Yes   Consult Status Consult Status: Complete    Truddie Crumble 12/18/2015,  10:40 AM

## 2015-12-19 NOTE — Discharge Summary (Signed)
OB Discharge Summary     Patient Name: Emily Saunders DOB: Aug 12, 1975 MRN: [id number]  Date of admission: 12/16/2015 Delivering MD: Lucila Maine C   Date of discharge: 12/19/2015  Admitting diagnosis: INDUCTION Intrauterine pregnancy: [redacted]w[redacted]d     Secondary diagnosis:  Principal Problem:   VBAC (vaginal birth after Cesarean) Active Problems:   Obesity (BMI 30-39.9)   Current smoker   Supervision of high-risk pregnancy   Chronic hypertension affecting pregnancy  Additional problems: None     Discharge diagnosis: Term Pregnancy Delivered, VBAC and CHTN                                                                                                Post partum procedures:None  Augmentation: Pitocin  Complications: None  Hospital course:  Induction of Labor With Vaginal Delivery   40 y.o. yo [id number] at [redacted]w[redacted]d was admitted to the hospital 12/16/2015 for induction of labor.  Indication for induction: chronic hypertension.  Patient had an uncomplicated labor course as follows: Membrane Rupture Time/Date: 3:16 PM ,12/16/2015   Intrapartum Procedures: Episiotomy: None [1]                                         Lacerations:  None [1]  Patient had delivery of a Viable infant.  Information for the patient's newborn:  Monseratt, [place] Girl Avaline [id number]  Delivery Method: VBAC, Spontaneous (Filed from Delivery Summary)   12/16/2015  Details of delivery can be found in separate delivery note.  Patient had a routine postpartum course. Patient is discharged home 12/19/15.  On day of discharge, denies any signs/symptoms of preeclampsia including: no CP/SOB, no RUQ/epigastric pain, no changes in vision, no headaches. Reviewed these signs/symptoms with patient and when to return to ED or office.   Physical exam Vitals:   12/18/15 2300 12/19/15 0315 12/19/15 0520 12/19/15 1037  BP: (!) 122/56 134/80 (!) 153/79 (!) 153/76  Pulse: 71 75 69 68  Resp: 18  18   Temp:   98.3 F (36.8 C)   TempSrc:    Oral   SpO2:      Weight:      Height:       General: alert, cooperative and no distress Lochia: appropriate Uterine Fundus: firm Incision: N/A DVT Evaluation: No evidence of DVT seen on physical exam. Negative Homan's sign. No cords or calf tenderness. Calf/Ankle edema is present  Labs: Lab Results  Component Value Date   WBC 6.3 12/16/2015   HGB 11.1 (L) 12/16/2015   HCT 33.1 (L) 12/16/2015   MCV 86.4 12/16/2015   PLT 132 (L) 12/16/2015   CMP Latest Ref Rng & Units 12/16/2015  Glucose 65 - 99 mg/dL 86  BUN 6 - 20 mg/dL 5(L)  Creatinine 0.44 - 1.00 mg/dL 0.58  Sodium 135 - 145 mmol/L 137  Potassium 3.5 - 5.1 mmol/L 3.5  Chloride 101 - 111 mmol/L 108  CO2 22 - 32 mmol/L 21(L)  Calcium 8.9 - 10.3 mg/dL 8.9  Total Protein  6.5 - 8.1 g/dL 6.6  Total Bilirubin 0.3 - 1.2 mg/dL 0.4  Alkaline Phos 38 - 126 U/L 106  AST 15 - 41 U/L 17  ALT 14 - 54 U/L 10(L)    Discharge instruction: per After Visit Summary and "Baby and Me Booklet".  After visit meds:    Medication List    STOP taking these medications   labetalol 200 MG tablet Commonly known as:  NORMODYNE     TAKE these medications   amLODipine 10 MG tablet Commonly known as:  NORVASC Take 1 tablet (10 mg total) by mouth daily.   diphenhydramine-acetaminophen 25-500 MG Tabs tablet Commonly known as:  TYLENOL PM Take 2 tablets by mouth at bedtime as needed (sleep).   ibuprofen 600 MG tablet Commonly known as:  ADVIL,MOTRIN Take 1 tablet (600 mg total) by mouth every 6 (six) hours.   PRENATAL VITAMIN PO Take 1 tablet by mouth daily.   senna-docusate 8.6-50 MG tablet Commonly known as:  Senokot-S Take 2 tablets by mouth 2 (two) times daily.       Diet: low salt diet  Activity: Advance as tolerated. Pelvic rest for 6 weeks.   Outpatient follow up:6 weeks for postpartum visit; 2 weeks for BP recheck; Baby love has been consulted to visit patient for BP checks weekly Follow up Appt:No future  appointments. Follow up Visit:No Follow-up on file.  Postpartum contraception: Depo Provera and Tubal Ligation, interval  Newborn Data: Live born female  Birth Weight: 6 lb 15.6 oz (3165 g) APGAR: 8, 9  Baby Feeding: Breast Disposition:home with mother   BP meds sent to pharmacy. Elevated BPs still today, but no signs/symptoms of preE (reviewed these with the patient). Needs close follow up for better BP control, may consider thiazide diuretic if Norvasc is not sufficient for decreasing BP at follow up visit.   12/19/2015 Katherine Basset, DO  OB Fellow

## 2015-12-19 NOTE — Progress Notes (Signed)
Per NT, pt was in bed and asleep with the baby and was reminded that the baby needs to be in the crib and not in the bed while mom is sleeping; mom said "we're okay." NT told her that it is policy and for the baby's safety to be in the crib, mom said that she was going to get up and put baby in crib

## 2015-12-19 NOTE — Lactation Note (Signed)
This note was copied from a baby's chart. Lactation Consultation Note  Patient Name: Emily Saunders [id number] Date: 12/19/2015 Reason for consult: Follow-up assessment;Infant weight loss (8% weight loss )  Baby is at 8% weight loss, per mom I switched to formula( bottles)  LC explored the reasons why and per mom due to the baby not being satisfied  At the after breast feeding an hour. Mom willing to listen to BF options. LC discussed  8% weight loss and how babies act with weight loss, cluster feeding normal.  LC also mentioned supplementing with be recommended. Baby was to the breast last at 2300,  And had bottles all night. LC recommended if mom has a desire to breast feed breast feed 1st  And then supplement up to 30 ml , when breast are fuller and hearing more swallows, volume is increasing  And baby may not take has much of the supplement. Also could offer 2nd breast at the same feeding.  Sore nipple and engorgement prevention and tx reviewed and LC stressed the importance of releasing  The 2nd breast down if the baby only feeds 1st breast to protect milk supply. Per mom was already given a hand pump.  LC referred to the Baby referred to the Baby and me booklet pages 67 -25.  Mother informed of post-discharge support and given phone number to the lactation department, including services for phone call assistance; out-patient appointments; and breastfeeding support group. List of other breastfeeding resources in the community given in the handout. Encouraged mother to call for problems or concerns related to breastfeeding.   Maternal Data    Feeding Feeding Type: Bottle Fed - Formula Nipple Type: Slow - flow  LATCH Score/Interventions                Intervention(s): Breastfeeding basics reviewed     Lactation Tools Discussed/Used Tools: Pump (per mom already has han pump ) Breast pump type: Manual WIC Program: Yes   Consult Status Consult Status: Complete Date:  12/19/15    Jerlyn Ly Bhavya Grand 12/19/2015, 9:02 AM

## 2015-12-19 NOTE — Discharge Instructions (Signed)
Vaginal Delivery, Care After °Refer to this sheet in the next few weeks. These discharge instructions provide you with information on caring for yourself after delivery. Your health care provider may also give you specific instructions. Your treatment has been planned according to the most current medical practices available, but problems sometimes occur. Call your health care provider if you have any problems or questions after you go home. °HOME CARE INSTRUCTIONS °· Take over-the-counter or prescription medicines only as directed by your health care provider or pharmacist. °· Do not drink alcohol, especially if you are breastfeeding or taking medicine to relieve pain. °· Do not chew or smoke tobacco. °· Do not use illegal drugs. °· Continue to use good perineal care. Good perineal care includes: °¨ Wiping your perineum from front to back. °¨ Keeping your perineum clean. °· Do not use tampons or douche until your health care provider says it is okay. °· Shower, wash your hair, and take tub baths as directed by your health care provider. °· Wear a well-fitting bra that provides breast support. °· Eat healthy foods. °· Drink enough fluids to keep your urine clear or pale yellow. °· Eat high-fiber foods such as whole grain cereals and breads, brown rice, beans, and fresh fruits and vegetables every day. These foods may help prevent or relieve constipation. °· Follow your health care provider's recommendations regarding resumption of activities such as climbing stairs, driving, lifting, exercising, or traveling. °· Talk to your health care provider about resuming sexual activities. Resumption of sexual activities is dependent upon your risk of infection, your rate of healing, and your comfort and desire to resume sexual activity. °· Try to have someone help you with your household activities and your newborn for at least a few days after you leave the hospital. °· Rest as much as possible. Try to rest or take a nap  when your newborn is sleeping. °· Increase your activities gradually. °· Keep all of your scheduled postpartum appointments. It is very important to keep your scheduled follow-up appointments. At these appointments, your health care provider will be checking to make sure that you are healing physically and emotionally. °SEEK MEDICAL CARE IF:  °· You are passing large clots from your vagina. Save any clots to show your health care provider. °· You have a foul smelling discharge from your vagina. °· You have trouble urinating. °· You are urinating frequently. °· You have pain when you urinate. °· You have a change in your bowel movements. °· You have increasing redness, pain, or swelling near your vaginal incision (episiotomy) or vaginal tear. °· You have pus draining from your episiotomy or vaginal tear. °· Your episiotomy or vaginal tear is separating. °· You have painful, hard, or reddened breasts. °· You have a severe headache. °· You have blurred vision or see spots. °· You feel sad or depressed. °· You have thoughts of hurting yourself or your newborn. °· You have questions about your care, the care of your newborn, or medicines. °· You are dizzy or light-headed. °· You have a rash. °· You have nausea or vomiting. °· You were breastfeeding and have not had a menstrual period within 12 weeks after you stopped breastfeeding. °· You are not breastfeeding and have not had a menstrual period by the 12th week after delivery. °· You have a fever. °SEEK IMMEDIATE MEDICAL CARE IF:  °· You have persistent pain. °· You have chest pain. °· You have shortness of breath. °· You faint. °· You   have leg pain.  You have stomach pain.  Your vaginal bleeding saturates two or more sanitary pads in 1 hour.   This information is not intended to replace advice given to you by your health care provider. Make sure you discuss any questions you have with your health care provider.   Document Released: 01/22/2000 Document Revised:  10/15/2014 Document Reviewed: 09/21/2011 Elsevier Interactive Patient Education 2016 Elsevier Inc.   Preeclampsia and Eclampsia Preeclampsia is a serious condition that develops only during pregnancy. It is also called toxemia of pregnancy. This condition causes high blood pressure along with other symptoms, such as swelling and headaches. These may develop as the condition gets worse. Preeclampsia may occur 20 weeks or later into your pregnancy.  Diagnosing and treating preeclampsia early is very important. If not treated early, it can cause serious problems for you and your baby. One problem it can lead to is eclampsia, which is a condition that causes muscle jerking or shaking (convulsions) in the mother. Delivering your baby is the best treatment for preeclampsia or eclampsia.  RISK FACTORS The cause of preeclampsia is not known. You may be more likely to develop preeclampsia if you have certain risk factors. These include:   Being pregnant for the first time.  Having preeclampsia in a past pregnancy.  Having a family history of preeclampsia.  Having high blood pressure.  Being pregnant with twins or triplets.  Being 65 or older.  Being African American.  Having kidney disease or diabetes.  Having medical conditions such as lupus or blood diseases.  Being very overweight (obese). SIGNS AND SYMPTOMS  The earliest signs of preeclampsia are:  High blood pressure.  Increased protein in your urine. Your health care provider will check for this at every prenatal visit. Other symptoms that can develop include:   Severe headaches.  Sudden weight gain.  Swelling of your hands, face, legs, and feet.  Feeling sick to your stomach (nauseous) and throwing up (vomiting).  Vision problems (blurred or double vision).  Numbness in your face, arms, legs, and feet.  Dizziness.  Slurred speech.  Sensitivity to bright lights.  Abdominal pain. DIAGNOSIS  There are no screening  tests for preeclampsia. Your health care provider will ask you about symptoms and check for signs of preeclampsia during your prenatal visits. You may also have tests, including:  Urine testing.  Blood testing.  Checking your baby's heart rate.  Checking the health of your baby and your placenta using images created with sound waves (ultrasound). TREATMENT  You can work out the best treatment approach together with your health care provider. It is very important to keep all prenatal appointments. If you have an increased risk of preeclampsia, you may need more frequent prenatal exams.  Your health care provider may prescribe bed rest.  You may have to eat as little salt as possible.  You may need to take medicine to lower your blood pressure if the condition does not respond to more conservative measures.  You may need to stay in the hospital if your condition is severe. There, treatment will focus on controlling your blood pressure and fluid retention. You may also need to take medicine to prevent seizures.  If the condition gets worse, your baby may need to be delivered early to protect you and the baby. You may have your labor started with medicine (be induced), or you may have a cesarean delivery.  Preeclampsia usually goes away after the baby is born. HOME CARE INSTRUCTIONS  Only take over-the-counter or prescription medicines as directed by your health care provider.  Lie on your left side while resting. This keeps pressure off your baby.  Elevate your feet while resting.  Get regular exercise. Ask your health care provider what type of exercise is safe for you.  Avoid caffeine and alcohol.  Do not smoke.  Drink 6-8 glasses of water every day.  Eat a balanced diet that is low in salt. Do not add salt to your food.  Avoid stressful situations as much as possible.  Get plenty of rest and sleep.  Keep all prenatal appointments and tests as scheduled. SEEK MEDICAL  CARE IF:  You are gaining more weight than expected.  You have any headaches, abdominal pain, or nausea.  You are bruising more than usual.  You feel dizzy or light-headed. SEEK IMMEDIATE MEDICAL CARE IF:   You develop sudden or severe swelling anywhere in your body. This usually happens in the legs.  You gain 5 lb (2.3 kg) or more in a week.  You have a severe headache, dizziness, problems with your vision, or confusion.  You have severe abdominal pain.  You have lasting nausea or vomiting.  You have a seizure.  You have trouble moving any part of your body.  You develop numbness in your body.  You have trouble speaking.  You have any abnormal bleeding.  You develop a stiff neck.  You pass out. MAKE SURE YOU:   Understand these instructions.  Will watch your condition.  Will get help right away if you are not doing well or get worse.   This information is not intended to replace advice given to you by your health care provider. Make sure you discuss any questions you have with your health care provider.   Document Released: 01/22/2000 Document Revised: 01/29/2013 Document Reviewed: 11/16/2012 Elsevier Interactive Patient Education 2016 Elsevier Inc.   Storing Breast Milk Breast milk is a living fluid that contains infection-fighting cells (antibodies). Pre-pumped (expressed) breast milk needs to be stored in a certain way so that it remains effective in protecting your baby against infections. The following guidelines are for storing breast milk for a healthy, full-term infant.  HOW LONG CAN BREAST MILK BE STORED?  Milk can be stored for up to 4 hours at room temperature, or 60-1F (15.6-19.4C). However, it is acceptable to allow the milk to sit for 6-8 hours if the pump parts and containers are well cleaned.  Milk can be stored for 3 days in a refrigerator at less than 77F (3.9C). However, it is acceptable to allow the milk to sit for up to 8 days if the  pump parts and containers are well cleaned.  Milk can be stored for 2 weeks in a freezer compartment inside a refrigerator.  Milk can be stored for 3-4 months in a freezer unit with a separate door.  Milk can be stored for 6-12 months in a deep freezer at -46F (-20C). A deep freezer is a chest or stand-alone freezer that is not opened very often and stays at a colder temperature. HOW SHOULD I STORE BREAST MILK?  Milk may be stored in a:  Glass container.  Hard plastic container.  Plastic bag specially designed for storing milk. Many women like these because they take up less space and can be attached directly to the breast pump.  Store your milk in 2-4 oz (60-120 mL) servings. This makes it easier to thaw the milk. It also helps you avoid  having to throw out milk your baby does not drink.  Leave an inch or so at the top of the bag or bottle so the milk has room to expand as it freezes.  Label each container with the date and time the milk was pumped so that you use the milk in the order it was pumped.  If you will be freezing the milk, store it in the back of the freezer. This prevents the milk from being affected by temperature changes due to the freezer door being opened. THAWING FROZEN BREAST MILK  Frozen milk can be thawed:  In a refrigerator.  Under warm-running tap water.  In a pan of warm water that has been heated on the stove.  Do not heat milk directly on the stove or in a microwave as this will destroy some of its infection-fighting properties.  Thawed milk can be stored in the refrigerator for up to 24 hours but should not be refrozen.  If you want to add freshly pumped milk to the frozen milk, make sure to add less milk than what is already frozen. Chill the fresh milk in your refrigerator for 30 minutes before adding it to the milk in the freezer.   This information is not intended to replace advice given to you by your health care provider. Make sure you  discuss any questions you have with your health care provider.   Document Released: 11/21/2008 Document Revised: 01/29/2013 Document Reviewed: 11/05/2012 Elsevier Interactive Patient Education [employer].

## 2015-12-23 ENCOUNTER — Ambulatory Visit: Payer: Medicaid Other | Admitting: Certified Nurse Midwife

## 2015-12-23 VITALS — BP 138/103 | HR 89

## 2015-12-23 DIAGNOSIS — O165 Unspecified maternal hypertension, complicating the puerperium: Secondary | ICD-10-CM

## 2015-12-23 DIAGNOSIS — O288 Other abnormal findings on antenatal screening of mother: Secondary | ICD-10-CM

## 2015-12-23 NOTE — Progress Notes (Signed)
Per Rachelle : Patient to increase her dosing to 20 mg daily and come in for recheck on Monday. Patient is asymptomatic without headache or vision changes. No swelling. Pharmacy notified.

## 2015-12-25 ENCOUNTER — Encounter: Payer: Medicaid Other | Admitting: Certified Nurse Midwife

## 2015-12-28 ENCOUNTER — Ambulatory Visit: Payer: Medicaid Other

## 2015-12-28 ENCOUNTER — Ambulatory Visit: Payer: Medicaid Other | Admitting: Certified Nurse Midwife

## 2015-12-28 VITALS — BP 131/91 | HR 97

## 2015-12-28 DIAGNOSIS — N3000 Acute cystitis without hematuria: Secondary | ICD-10-CM

## 2015-12-28 DIAGNOSIS — O165 Unspecified maternal hypertension, complicating the puerperium: Secondary | ICD-10-CM

## 2015-12-28 LAB — POCT URINALYSIS DIPSTICK
BILIRUBIN UA: NEGATIVE
Blood, UA: 50
Glucose, UA: NEGATIVE
Ketones, UA: NEGATIVE
NITRITE UA: POSITIVE
Protein, UA: 1
Spec Grav, UA: 1.015
UROBILINOGEN UA: NEGATIVE
pH, UA: 6

## 2015-12-28 MED ORDER — NITROFURANTOIN MONOHYD MACRO 100 MG PO CAPS: 100.0000 mg | ORAL_CAPSULE | Freq: Two times a day (BID) | ORAL | 0 refills | Status: DC

## 2015-12-28 NOTE — Progress Notes (Signed)
Per Rachelle- BP is stable- patient is good to go and will schedule to come back to see provider for PP and consult for BTL. Patient treated for UTI- culture sent to lab.

## 2015-12-30 LAB — URINE CULTURE

## 2016-01-04 ENCOUNTER — Other Ambulatory Visit: Payer: Self-pay | Admitting: Certified Nurse Midwife

## 2016-01-04 DIAGNOSIS — N3000 Acute cystitis without hematuria: Secondary | ICD-10-CM

## 2016-01-04 MED ORDER — CEPHALEXIN 500 MG PO CAPS: 500.0000 mg | ORAL_CAPSULE | Freq: Three times a day (TID) | ORAL | 0 refills | Status: DC

## 2016-01-11 ENCOUNTER — Telehealth: Payer: Self-pay

## 2016-01-11 NOTE — Telephone Encounter (Signed)
Called pt to advise of lab results and rx sent, no answer, left vm to call.

## 2016-01-11 NOTE — Telephone Encounter (Signed)
Patient called back, went over lab results, rx sent and reminded of appt on 01-13-16.

## 2016-01-12 ENCOUNTER — Telehealth: Payer: Self-pay

## 2016-01-12 DIAGNOSIS — N3000 Acute cystitis without hematuria: Secondary | ICD-10-CM

## 2016-01-12 MED ORDER — CEPHALEXIN 500 MG PO CAPS: 500.0000 mg | ORAL_CAPSULE | Freq: Three times a day (TID) | ORAL | 0 refills | Status: DC

## 2016-01-12 NOTE — Telephone Encounter (Signed)
Returned call and pt stated that pharmacy did not receive her rx, advised pt that we would re-submit.

## 2016-01-13 ENCOUNTER — Encounter: Payer: Self-pay | Admitting: Obstetrics & Gynecology

## 2016-01-13 ENCOUNTER — Ambulatory Visit (INDEPENDENT_AMBULATORY_CARE_PROVIDER_SITE_OTHER): Payer: Medicaid Other | Admitting: Obstetrics & Gynecology

## 2016-01-13 NOTE — Patient Instructions (Signed)
Laparoscopic Tubal Ligation Introduction Laparoscopic tubal ligation is a procedure to close the fallopian tubes. This is done so that you cannot get pregnant. When the fallopian tubes are closed, the eggs that your ovaries release cannot enter the uterus, and sperm cannot reach the released eggs. A laparoscopic tubal ligation is sometimes called "getting your tubes tied." You should not have this procedure if you want to get pregnant someday or if you are unsure about having more children. Tell a health care provider about:  Any allergies you have.  All medicines you are taking, including vitamins, herbs, eye drops, creams, and over-the-counter medicines.  Any problems you or family members have had with anesthetic medicines.  Any blood disorders you have.  Any surgeries you have had.  Any medical conditions you have.  Whether you are pregnant or may be pregnant.  Any past pregnancies. What are the risks? Generally, this is a safe procedure. However, problems may occur, including:  Infection.  Bleeding.  Injury to surrounding organs.  Side effects from anesthetics.  Failure of the procedure. This procedure can increase your risk of a kind of pregnancy in which a fertilized egg attaches to the outside of the uterus (ectopic pregnancy). What happens before the procedure?  Ask your health care provider about:  Changing or stopping your regular medicines. This is especially important if you are taking diabetes medicines or blood thinners.  Taking medicines such as aspirin and ibuprofen. These medicines can thin your blood. Do not take these medicines before your procedure if your health care provider instructs you not to.  Follow instructions from your health care provider about eating and drinking restrictions.  Plan to have someone take you home after the procedure.  If you go home right after the procedure, plan to have someone with you for 24 hours. What happens during  the procedure?  You will be given one or more of the following:  A medicine to help you relax (sedative).  A medicine to numb the area (local anesthetic).  A medicine to make you fall asleep (general anesthetic).  A medicine that is injected into an area of your body to numb everything below the injection site (regional anesthetic).  An IV tube will be inserted into one of your veins. It will be used to give you medicines and fluids during the procedure.  Your bladder may be emptied with a small tube (catheter).  If you have been given a general anesthetic, a tube will be put down your throat to help you breathe.  Two small cuts (incisions) will be made in your lower abdomen and near your belly button.  Your abdomen will be inflated with a gas. This will let the surgeon see better and will give the surgeon room to work.  A thin, lighted tube (laparoscope) with a camera attached will be inserted into your abdomen through one of the incisions. Small instruments will be inserted through the other incision.  The fallopian tubes will be tied off, burned (cauterized), or blocked with a clip, ring, or clamp. A small portion in the center of each fallopian tube may be removed.  The gas will be released from the abdomen.  The incisions will be closed with stitches (sutures).  A bandage (dressing) will be placed over the incisions. The procedure may vary among health care providers and hospitals. What happens after the procedure?  Your blood pressure, heart rate, breathing rate, and blood oxygen level will be monitored often until the medicines you  were given have worn off.  You will be given medicine to help with pain, nausea, and vomiting as needed. This information is not intended to replace advice given to you by your health care provider. Make sure you discuss any questions you have with your health care provider. Document Released: 05/02/2000 Document Revised: 07/02/2015 Document  Reviewed: 01/04/2015  2017 Elsevier

## 2016-01-13 NOTE — Progress Notes (Signed)
Subjective:wants sterilization     Emily Saunders is a 40 y.o. female who presents for a postpartum visit. She is 4 weeks postpartum following a spontaneous vaginal delivery. I have fully reviewed the prenatal and intrapartum course. The delivery was at 73 gestational weeks. Outcome: spontaneous vaginal delivery. Anesthesia: epidural. Postpartum course has been good. Baby's course has been normal. Baby is feeding by breast. Bleeding no bleeding. Bowel function is normal. Bladder function is normal. Patient is not sexually active. Contraception method is tubal ligation. Postpartum depression screening: negative. Patient is 40 y.o. [id number] [redacted]w[redacted]d admitted for IOL due to chronic hypertension, hx of c-section   Delivery Note At 3:16 PM a viable female was delivered via VBAC, Spontaneous (Presentation: vertex; LOA).  APGAR: 8, 9; weight pending.   Placenta status: spontaneously delivered with gentle traction, intact.  Cord: 3vc with the following complications: none.  Cord pH: not sent  Anesthesia:  epidural Episiotomy: None Lacerations: None Suture Repair: none Est. Blood Loss (mL):  50 mL  Mom to postpartum.  Baby to Couplet care / Skin to Skin.  [name] 12/16/2015, 3:29 PM  Upon arrival patient was complete and pushing. She pushed with good maternal effort to deliver a healthy baby girl. Baby delivered without difficulty, was noted to have good tone and place on maternal abdomen for oral suctioning, drying and stimulation. Delayed cord clamping performed. Placenta delivered intact with 3V cord. Vaginal canal and perineum was inspected and without lacerations; hemostatic. Pitocin was started and uterus massaged until bleeding slowed. Counts of sharps, instruments, and lap pads were all correct.   [name], DO PGY-1 11/8/20173:29 PM   OB FELLOW DELIVERY ATTESTATION  I was gloved and present for the delivery in its entirety, and I agree with the above resident's  note.  This was a successful VBAC delivery.   Katherine Basset, DO OB Fellow  The following portions of the patient's history were reviewed and updated as appropriate: allergies, current medications, past family history, past medical history, past social history, past surgical history and problem list.  Review of Systems Pertinent items are noted in HPI.   Objective:    BP 125/86   Pulse 85   Wt 205 lb (93 kg)   BMI 33.09 kg/m   General:  alert, cooperative and no distress           Abdomen: soft, non-tender; bowel sounds normal; no masses,  no organomegaly   Vulva:  not evaluated  Vagina: not evaluated   Assessment:     normal 4 week postpartum exam. Pap smear not done at today's visit.   Plan:    1. Contraception: tubal ligation 2. Will schedule LBTL.  The risks of surgery were discussed in detail with the patient including but not limited to: bleeding which may require transfusion or reoperation; infection which may require prolonged hospitalization or re-hospitalization and antibiotic therapy; injury to bowel, bladder, ureters and major vessels or other surrounding organs; need for additional procedures including laparotomy; thromboembolic phenomenon, incisional problems and other postoperative or anesthesia complications.  Patient was told that the likelihood that her condition and symptoms will be treated effectively with this surgical management was very high; the postoperative expectations were also discussed in detail. The patient also understands the alternative treatment options which were discussed in full. All questions were answered.  She was told that she will be contacted by our surgical scheduler regarding the time and date of her surgery; routine preoperative instructions of having nothing  to eat or drink after midnight on the day prior to surgery and also coming to the hospital 1.5 hours prior to her time of surgery were also emphasized.  She was told she may be  called for a preoperative appointment about a week prior to surgery and will be given further preoperative instructions at that visit. Printed patient education handouts about the procedure were given to the patient to review at home.   3. Follow up  as needed.    Woodroe Mode, MD 01/13/2016

## 2016-01-15 ENCOUNTER — Telehealth: Payer: Self-pay

## 2016-01-15 NOTE — Telephone Encounter (Signed)
Returned patient call and patient stated that she got everything worked out already.

## 2016-01-25 NOTE — Patient Instructions (Addendum)
Your procedure is scheduled on:  Wednesday, Dec. 27, 2017  Enter through the [employer] [facility] at:  6:30 AM  Pick up the phone at the desk and dial [phone].  Call this number if you have problems the morning of surgery: [phone].  Remember: Do NOT eat food or drink after:  Midnight Tuesday  Take these medicines the morning of surgery with a SIP OF WATER:  Amlodipine  Stop ALL herbal medications at this time  DO NOT SMOKE DAY OF SURGERY  Do NOT wear jewelry (body piercing), metal hair clips/bobby pins, make-up, or nail polish. Do NOT wear lotions, powders, or perfumes.  You may wear deodorant. Do NOT shave for 48 hours prior to surgery. Do NOT bring valuables to the hospital. Contacts, dentures, or bridgework may not be worn into surgery.  Have a responsible adult drive you home and stay with you for 24 hours after your procedure

## 2016-01-26 ENCOUNTER — Encounter (HOSPITAL_COMMUNITY): Payer: Self-pay

## 2016-01-26 ENCOUNTER — Encounter (HOSPITAL_COMMUNITY)
Admission: RE | Admit: 2016-01-26 | Discharge: 2016-01-26 | Disposition: A | Discharge status: Home / Self Care | Payer: Medicaid Other | Source: Ambulatory Visit | Attending: Obstetrics & Gynecology | Admitting: Obstetrics & Gynecology

## 2016-01-26 ENCOUNTER — Other Ambulatory Visit: Payer: Self-pay | Admitting: Obstetrics & Gynecology

## 2016-01-26 DIAGNOSIS — Z01812 Encounter for preprocedural laboratory examination: Secondary | ICD-10-CM | POA: Insufficient documentation

## 2016-01-26 DIAGNOSIS — E559 Vitamin D deficiency, unspecified: Secondary | ICD-10-CM | POA: Diagnosis not present

## 2016-01-26 DIAGNOSIS — N739 Female pelvic inflammatory disease, unspecified: Secondary | ICD-10-CM | POA: Diagnosis not present

## 2016-01-26 DIAGNOSIS — D649 Anemia, unspecified: Secondary | ICD-10-CM | POA: Insufficient documentation

## 2016-01-26 DIAGNOSIS — E669 Obesity, unspecified: Secondary | ICD-10-CM | POA: Diagnosis not present

## 2016-01-26 DIAGNOSIS — I10 Essential (primary) hypertension: Secondary | ICD-10-CM | POA: Diagnosis not present

## 2016-01-26 DIAGNOSIS — F17200 Nicotine dependence, unspecified, uncomplicated: Secondary | ICD-10-CM | POA: Diagnosis not present

## 2016-01-26 DIAGNOSIS — L0591 Pilonidal cyst without abscess: Secondary | ICD-10-CM | POA: Diagnosis not present

## 2016-01-26 HISTORY — DX: Pneumonia, unspecified organism: J18.9

## 2016-01-26 LAB — BASIC METABOLIC PANEL
ANION GAP: 10 (ref 5–15)
BUN: 10 mg/dL (ref 6–20)
CALCIUM: 8.9 mg/dL (ref 8.9–10.3)
CO2: 24 mmol/L (ref 22–32)
Chloride: 104 mmol/L (ref 101–111)
Creatinine, Ser: 0.78 mg/dL (ref 0.44–1.00)
GLUCOSE: 89 mg/dL (ref 65–99)
POTASSIUM: 3 mmol/L — AB (ref 3.5–5.1)
Sodium: 138 mmol/L (ref 135–145)

## 2016-01-26 LAB — CBC
HCT: 41.4 % (ref 36.0–46.0)
Hemoglobin: 14.2 g/dL (ref 12.0–15.0)
MCH: 29.6 pg (ref 26.0–34.0)
MCHC: 34.3 g/dL (ref 30.0–36.0)
MCV: 86.3 fL (ref 78.0–100.0)
PLATELETS: 156 10*3/uL (ref 150–400)
RBC: 4.8 MIL/uL (ref 3.87–5.11)
RDW: 15 % (ref 11.5–15.5)
WBC: 4.8 10*3/uL (ref 4.0–10.5)

## 2016-01-26 NOTE — Pre-Procedure Instructions (Signed)
Sent Dr. Roselie Awkward an inbox in reference to low potassium.  I also spoke with Ivin Booty at Dr. Jordan Hawks office.

## 2016-01-27 ENCOUNTER — Other Ambulatory Visit: Payer: Medicaid Other

## 2016-01-27 DIAGNOSIS — H938X1 Other specified disorders of right ear: Secondary | ICD-10-CM

## 2016-01-27 DIAGNOSIS — R8290 Unspecified abnormal findings in urine: Secondary | ICD-10-CM

## 2016-01-31 LAB — URINE CULTURE

## 2016-02-03 ENCOUNTER — Ambulatory Visit (HOSPITAL_COMMUNITY)
Admission: RE | Admit: 2016-02-03 | Payer: Medicaid Other | Source: Ambulatory Visit | Admitting: Obstetrics & Gynecology

## 2016-02-03 ENCOUNTER — Encounter (HOSPITAL_COMMUNITY): Admission: RE | Payer: Self-pay | Source: Ambulatory Visit

## 2016-02-03 SURGERY — LIGATION, FALLOPIAN TUBE, LAPAROSCOPIC
Anesthesia: Choice | Site: Abdomen | Laterality: Bilateral

## 2016-02-18 ENCOUNTER — Other Ambulatory Visit: Payer: Self-pay | Admitting: Obstetrics

## 2016-02-19 ENCOUNTER — Telehealth: Payer: Self-pay | Admitting: *Deleted

## 2016-02-19 NOTE — Telephone Encounter (Signed)
Call placed to pt regarding Rx request for pain medication for cramping. Pt request ?Hydromorphone. Dr Jodi Mourning has denied request for this medication.    Left detailed message on pt VM making her aware of Dr Jodi Mourning decision on Rx.  Pt was advised to take Ibuprofen 800mg  every 6 hours and/or Aleve. Pt advised to call office if any other questions.

## 2016-03-03 ENCOUNTER — Ambulatory Visit (INDEPENDENT_AMBULATORY_CARE_PROVIDER_SITE_OTHER): Payer: Medicaid Other | Admitting: Obstetrics

## 2016-03-03 ENCOUNTER — Encounter: Payer: Self-pay | Admitting: *Deleted

## 2016-03-03 ENCOUNTER — Encounter: Payer: Self-pay | Admitting: Obstetrics

## 2016-03-03 VITALS — BP 120/80 | HR 80 | Temp 98.7°F

## 2016-03-03 DIAGNOSIS — Z30430 Encounter for insertion of intrauterine contraceptive device: Secondary | ICD-10-CM | POA: Diagnosis not present

## 2016-03-03 DIAGNOSIS — N946 Dysmenorrhea, unspecified: Secondary | ICD-10-CM

## 2016-03-03 LAB — POCT URINE PREGNANCY: Preg Test, Ur: NEGATIVE

## 2016-03-03 MED ORDER — HYDROCODONE-IBUPROFEN 7.5-200 MG PO TABS: 1.0000 | ORAL_TABLET | Freq: Three times a day (TID) | ORAL | 0 refills | Status: DC | PRN

## 2016-03-03 MED ORDER — PARAGARD INTRAUTERINE COPPER IU IUD
INTRAUTERINE_SYSTEM | Freq: Once | INTRAUTERINE | Status: AC
  Administered 2016-03-03: 12:00:00 via INTRAUTERINE

## 2016-03-03 MED ORDER — IBUPROFEN 600 MG PO TABS: 600.0000 mg | ORAL_TABLET | Freq: Three times a day (TID) | ORAL | 5 refills | Status: DC | PRN

## 2016-03-03 NOTE — Progress Notes (Signed)
IUD Insertion Procedure Note  Pre-operative Diagnosis: Desires Contraception  Post-operative Diagnosis: same  Indications: contraception  Procedure Details  Urine pregnancy test was done in office and result was negeative.  The risks (including infection, bleeding, pain, and uterine perforation) and benefits of the procedure were explained to the patient and Written informed consent was obtained.    Cervix cleansed with Betadine. Uterus sounded to 9 cm. IUD inserted without difficulty. String visible and trimmed. Patient tolerated procedure well.  IUD Information: ParaGard, Lot # [id number], Expiration date APR 2024.  Condition: Stable  Complications: None  Plan:  The patient was advised to call for any fever or for prolonged or severe pain or bleeding. She was advised to use NSAID and Vicoprofen as needed for mild to moderate pain.   Attending Physician Documentation: I was present for or participated in the entire procedure, including opening and closing.

## 2016-03-03 NOTE — Addendum Note (Signed)
Addended by: Manuela Schwartz C on: 03/03/2016 11:39 AM   Modules accepted: Orders

## 2016-03-03 NOTE — Addendum Note (Signed)
Addended by: Manuela Schwartz C on: 03/03/2016 11:31 AM   Modules accepted: Orders

## 2016-03-08 ENCOUNTER — Other Ambulatory Visit: Payer: Self-pay | Admitting: *Deleted

## 2016-03-08 NOTE — Telephone Encounter (Signed)
PA for Vicoprofen received from pharmacy.   Spoke with pharmacy, ins will only cover a 7 day supply. Rx was changed to reflect 7 day supply verbally to pharm staff.  LM on pt VM making her aware in change to Rx.

## 2016-04-14 ENCOUNTER — Encounter: Payer: Self-pay | Admitting: Obstetrics

## 2016-04-14 ENCOUNTER — Ambulatory Visit (INDEPENDENT_AMBULATORY_CARE_PROVIDER_SITE_OTHER): Payer: Medicaid Other | Admitting: Obstetrics

## 2016-04-14 VITALS — BP 131/94 | HR 73 | Ht 67.0 in | Wt 215.0 lb

## 2016-04-14 DIAGNOSIS — Z30431 Encounter for routine checking of intrauterine contraceptive device: Secondary | ICD-10-CM

## 2016-04-14 NOTE — Progress Notes (Signed)
Subjective:    Emily Saunders is a 41 y.o. female who presents for 6 week check up after IUD insertion. The patient has had heavier periods since delivery. The patient is sexually active. Pertinent past medical history: none.  The information documented in the HPI was reviewed and verified.  Menstrual History: OB History    Gravida Para Term Preterm AB Living   6 4 4   2 4    SAB TAB Ectopic Multiple Live Births   1   1 0 4      Patient's last menstrual period was 04/05/2016.   Patient Active Problem List   Diagnosis Date Noted  . HTN (hypertension) 02/27/2015  . Unintended weight loss 10/27/2014  . Allergic rhinitis 06/23/2014  . Elevated BP 06/23/2014  . Vitamin D insufficiency 03/03/2014  . Pilonidal cyst 03/03/2014  . Broken teeth 03/03/2014  . Poor sleep pattern 03/03/2014  . Obesity (BMI 30-39.9) 03/03/2014  . Current smoker 03/03/2014  . Anemia 05/27/2013   Past Medical History:  Diagnosis Date  . H/O pilonidal cyst 2002   . Hypertension   . PID (acute pelvic inflammatory disease) 2006   . Pneumonia   . Vitamin D deficiency 2015     Past Surgical History:  Procedure Laterality Date  . CESAREAN SECTION    . CYST EXCISION Right 2003   hand     Current Outpatient Prescriptions:  .  amLODipine (NORVASC) 10 MG tablet, Take 1 tablet (10 mg total) by mouth daily., Disp: 30 tablet, Rfl: 6 .  cephALEXin (KEFLEX) 500 MG capsule, Take 1 capsule (500 mg total) by mouth 3 (three) times daily. (Patient not taking: Reported on 01/21/2016), Disp: 21 capsule, Rfl: 0 .  HYDROcodone-ibuprofen (VICOPROFEN) 7.5-200 MG tablet, Take 1 tablet by mouth every 8 (eight) hours as needed for moderate pain. (Patient not taking: Reported on 04/14/2016), Disp: 30 tablet, Rfl: 0 .  ibuprofen (ADVIL,MOTRIN) 600 MG tablet, Take 1 tablet (600 mg total) by mouth every 8 (eight) hours as needed for cramping. (Patient not taking: Reported on 04/14/2016), Disp: 30 tablet, Rfl: 5 .  Prenatal Vit-Fe  Fumarate-FA (PRENATAL VITAMIN PO), Take 1 tablet by mouth daily. , Disp: , Rfl:  .  senna-docusate (SENOKOT-S) 8.6-50 MG tablet, Take 2 tablets by mouth 2 (two) times daily. (Patient not taking: Reported on 01/13/2016), Disp: 90 tablet, Rfl: 2 No Known Allergies  Social History  Substance Use Topics  . Smoking status: Current Some Day Smoker    Packs/day: 0.25    Years: 20.00    Types: Cigarettes  . Smokeless tobacco: Never Used     Comment: Smoking .5 ppd  . Alcohol use No    Family History  Problem Relation Age of Onset  . Hypertension Mother   . Diabetes Mother   . Asthma Mother   . COPD Mother   . Depression Mother   . Miscarriages / [place] Mother   . Vision loss Mother   . Heart disease Mother   . Parkinson's disease Father   . Diabetes Maternal Aunt   . Diabetes Maternal Uncle   . Diabetes Cousin        Review of Systems Constitutional: negative for weight loss Genitourinary:negative for abnormal menstrual periods and vaginal discharge   Objective:   BP (!) 131/94   Pulse 73   Ht 5\' 7"  (1.702 m)   Wt 215 lb (97.5 kg)   LMP 04/05/2016   BMI 33.67 kg/m  General:  Alert and no distress       Abdomen:  normal findings: no organomegaly, soft, non-tender and no hernia  Pelvis:  External genitalia: normal general appearance Urinary system: urethral meatus normal and bladder without fullness, nontender Vaginal: normal without tenderness, induration or masses Cervix: normal appearance.  IUD string visible and normal length Adnexa: normal bimanual exam Uterus: anteverted and non-tender, normal size   Lab Review Urine pregnancy test Labs reviewed yes Radiologic studies reviewed no  50% of 15 min visit spent on counseling and coordination of care.    Assessment:    41 y.o., continuing IUD, no contraindications.   Plan:    All questions answered. Contraception: IUD. Discussed healthy lifestyle modifications. Follow up in 3 months.  No  orders of the defined types were placed in this encounter.  No orders of the defined types were placed in this encounter.

## 2016-04-14 NOTE — Progress Notes (Signed)
Pt presents for IUD check c/o heavy bleeding only during menses since vaginal delivery. No complaints with IUD per pt.

## 2016-05-01 ENCOUNTER — Emergency Department (HOSPITAL_COMMUNITY)
Admission: EM | Admit: 2016-05-01 | Discharge: 2016-05-01 | Disposition: A | Discharge status: Home / Self Care | Payer: Self-pay | Attending: Emergency Medicine | Admitting: Emergency Medicine

## 2016-05-01 ENCOUNTER — Encounter (HOSPITAL_COMMUNITY): Payer: Self-pay | Admitting: Emergency Medicine

## 2016-05-01 DIAGNOSIS — B9789 Other viral agents as the cause of diseases classified elsewhere: Secondary | ICD-10-CM

## 2016-05-01 DIAGNOSIS — Z79899 Other long term (current) drug therapy: Secondary | ICD-10-CM | POA: Insufficient documentation

## 2016-05-01 DIAGNOSIS — J069 Acute upper respiratory infection, unspecified: Secondary | ICD-10-CM | POA: Insufficient documentation

## 2016-05-01 DIAGNOSIS — F17210 Nicotine dependence, cigarettes, uncomplicated: Secondary | ICD-10-CM | POA: Insufficient documentation

## 2016-05-01 DIAGNOSIS — I10 Essential (primary) hypertension: Secondary | ICD-10-CM | POA: Insufficient documentation

## 2016-05-01 MED ORDER — FLUTICASONE PROPIONATE 50 MCG/ACT NA SUSP: 1.0000 | Freq: Every day | NASAL | 2 refills | Status: DC

## 2016-05-01 MED ORDER — BENZONATATE 100 MG PO CAPS: 100.0000 mg | ORAL_CAPSULE | Freq: Three times a day (TID) | ORAL | 0 refills | Status: DC

## 2016-05-01 NOTE — Discharge Instructions (Signed)
Please read attached information. If you experience any new or worsening signs or symptoms please return to the emergency room for evaluation. Please follow-up with your primary care provider or specialist as discussed. Please use medication prescribed only as directed and discontinue taking if you have any concerning signs or symptoms.   °

## 2016-05-01 NOTE — ED Triage Notes (Signed)
Patient c/o cough, body aches, and nasal congestion since yesterday. Denies fevers, chest pain, and SOB. Ambulatory to triage.

## 2016-05-01 NOTE — ED Provider Notes (Signed)
[facility] DEPT Provider Note   CSN: [id number] Arrival date & time: 05/01/16  2144  By signing my name below, I, Hansel Feinstein, attest that this documentation has been prepared under the direction and in the presence of [employer], PA-C. Electronically Signed: Hansel Feinstein, ED Scribe. 05/01/16. 10:53 PM.    History   Chief Complaint Chief Complaint  Patient presents with  . Cough    HPI Emily Saunders is a 41 y.o. female who presents to the Emergency Department complaining of moderate, sudden onset nasal congestion that began yesterday with associated rhinorrhea, cough, HA. No worsening factors noted. No known recent sick contact with similar symptoms, but pt drives a school bus. No h/o asthma, DM. She denies fever, sore throat, ear pain, SOB.    The history is provided by the patient. No language interpreter was used.    Past Medical History:  Diagnosis Date  . H/O pilonidal cyst 2002   . Hypertension   . PID (acute pelvic inflammatory disease) 2006   . Pneumonia   . Vitamin D deficiency 2015     Patient Active Problem List   Diagnosis Date Noted  . HTN (hypertension) 02/27/2015  . Unintended weight loss 10/27/2014  . Allergic rhinitis 06/23/2014  . Elevated BP 06/23/2014  . Vitamin D insufficiency 03/03/2014  . Pilonidal cyst 03/03/2014  . Broken teeth 03/03/2014  . Poor sleep pattern 03/03/2014  . Obesity (BMI 30-39.9) 03/03/2014  . Current smoker 03/03/2014  . Anemia 05/27/2013    Past Surgical History:  Procedure Laterality Date  . CESAREAN SECTION    . CYST EXCISION Right 2003   hand    OB History    Gravida Para Term Preterm AB Living   6 4 4   2 4    SAB TAB Ectopic Multiple Live Births   1   1 0 4       Home Medications    Prior to Admission medications   Medication Sig Start Date End Date Taking? Authorizing Provider  amLODipine (NORVASC) 10 MG tablet Take 1 tablet (10 mg total) by mouth daily. 12/18/15   Rachelle A Denney, CNM    benzonatate (TESSALON) 100 MG capsule Take 1 capsule (100 mg total) by mouth every 8 (eight) hours. 05/01/16   Okey Regal, PA-C  cephALEXin (KEFLEX) 500 MG capsule Take 1 capsule (500 mg total) by mouth 3 (three) times daily. Patient not taking: Reported on 01/21/2016 01/12/16   Rachelle A Denney, CNM  fluticasone (FLONASE) 50 MCG/ACT nasal spray Place 1 spray into both nostrils daily. 05/01/16   Dellis Filbert Anderson Coppock, PA-C  HYDROcodone-ibuprofen (VICOPROFEN) 7.5-200 MG tablet Take 1 tablet by mouth every 8 (eight) hours as needed for moderate pain. Patient not taking: Reported on 04/14/2016 03/03/16   Shelly Bombard, MD  ibuprofen (ADVIL,MOTRIN) 600 MG tablet Take 1 tablet (600 mg total) by mouth every 8 (eight) hours as needed for cramping. Patient not taking: Reported on 04/14/2016 03/03/16   Shelly Bombard, MD  Prenatal Vit-Fe Fumarate-FA (PRENATAL VITAMIN PO) Take 1 tablet by mouth daily.     Historical Provider, MD  senna-docusate (SENOKOT-S) 8.6-50 MG tablet Take 2 tablets by mouth 2 (two) times daily. Patient not taking: Reported on 01/13/2016 12/18/15   Morene Crocker, CNM    Family History Family History  Problem Relation Age of Onset  . Hypertension Mother   . Diabetes Mother   . Asthma Mother   . COPD Mother   . Depression Mother   .  Miscarriages / [place] Mother   . Vision loss Mother   . Heart disease Mother   . Parkinson's disease Father   . Diabetes Maternal Aunt   . Diabetes Maternal Uncle   . Diabetes Cousin     Social History Social History  Substance Use Topics  . Smoking status: Current Some Day Smoker    Packs/day: 0.25    Years: 20.00    Types: Cigarettes  . Smokeless tobacco: Never Used     Comment: Smoking .5 ppd  . Alcohol use No     Allergies   Patient has no known allergies.   Review of Systems Review of Systems A complete 10 system review of systems was obtained and all systems are negative except as noted in the HPI and PMH.    Physical  Exam Updated Vital Signs BP 116/90 (BP Location: Left Arm)   Pulse 99   Temp 98.5 F (36.9 C) (Oral)   Resp 20   Ht 5\' 6"  (1.676 m)   Wt 97.5 kg   LMP 04/05/2016   SpO2 99%   BMI 34.70 kg/m   Physical Exam  Constitutional: She appears well-developed and well-nourished.  HENT:  Head: Normocephalic.  Right Ear: Tympanic membrane normal.  Left Ear: Tympanic membrane normal.  Nose: Mucosal edema and rhinorrhea present.  Mouth/Throat: Uvula is midline and mucous membranes are normal. No oropharyngeal exudate, posterior oropharyngeal edema or posterior oropharyngeal erythema.  Eyes: Conjunctivae are normal.  Cardiovascular: Normal rate, regular rhythm and normal heart sounds.   Pulmonary/Chest: Effort normal and breath sounds normal. No respiratory distress. She has no wheezes. She has no rales.  Abdominal: She exhibits no distension.  Musculoskeletal: Normal range of motion.  Neurological: She is alert.  Skin: Skin is warm and dry.  Psychiatric: She has a normal mood and affect. Her behavior is normal.  Nursing note and vitals reviewed.  ED Treatments / Results   DIAGNOSTIC STUDIES: Oxygen Saturation is 99% on RA, normal by my interpretation.    COORDINATION OF CARE: 10:47 PM Discussed treatment plan with pt at bedside which includes symptomatic treatment and pt agreed to plan.    Labs (all labs ordered are listed, but only abnormal results are displayed) Labs Reviewed - No data to display  EKG  EKG Interpretation None       Radiology No results found.  Procedures Procedures (including critical care time)  Medications Ordered in ED Medications - No data to display   Initial Impression / Assessment and Plan / ED Course  I have reviewed the triage vital signs and the nursing notes.  Pertinent labs & imaging results that were available during my care of the patient were reviewed by me and considered in my medical decision making (see chart for details).      Labs: none indicated  Imaging: none indicated  Consults: none  Therapeutics:  Discharge Meds: Tessalon perles, Flonase  Assessment/Plan: 41 year old female presents today with likely viral URI.  Short duration of symptoms, afebrile with no signs of acute bacterial infection.  She will be given symptomatic care instructions and return precautions.  She verbalized understanding and agreement to today's plan had no further questions or concerns  Final Clinical Impressions(s) / ED Diagnoses   Final diagnoses:  Viral URI with cough    New Prescriptions Discharge Medication List as of 05/01/2016 11:20 PM    START taking these medications   Details  benzonatate (TESSALON) 100 MG capsule Take 1 capsule (100 mg total) by  mouth every 8 (eight) hours., Starting Sun 05/01/2016, Print    fluticasone (FLONASE) 50 MCG/ACT nasal spray Place 1 spray into both nostrils daily., Starting Sun 05/01/2016, Print        I personally performed the services described in this documentation, which was scribed in my presence. The recorded information has been reviewed and is accurate.    Okey Regal, PA-C 49/61/16 [id number]    Delora Fuel, MD 91/22/58 [id number]

## 2016-05-07 ENCOUNTER — Encounter (HOSPITAL_COMMUNITY): Payer: Self-pay | Admitting: Emergency Medicine

## 2016-05-07 ENCOUNTER — Emergency Department (HOSPITAL_COMMUNITY)
Admission: EM | Admit: 2016-05-07 | Discharge: 2016-05-08 | Disposition: A | Discharge status: Home / Self Care | Payer: Self-pay | Attending: Emergency Medicine | Admitting: Emergency Medicine

## 2016-05-07 ENCOUNTER — Emergency Department (HOSPITAL_COMMUNITY): Payer: Self-pay

## 2016-05-07 DIAGNOSIS — J40 Bronchitis, not specified as acute or chronic: Secondary | ICD-10-CM | POA: Insufficient documentation

## 2016-05-07 DIAGNOSIS — R05 Cough: Secondary | ICD-10-CM

## 2016-05-07 DIAGNOSIS — Z79899 Other long term (current) drug therapy: Secondary | ICD-10-CM | POA: Insufficient documentation

## 2016-05-07 DIAGNOSIS — R062 Wheezing: Secondary | ICD-10-CM | POA: Insufficient documentation

## 2016-05-07 DIAGNOSIS — R059 Cough, unspecified: Secondary | ICD-10-CM

## 2016-05-07 DIAGNOSIS — F17210 Nicotine dependence, cigarettes, uncomplicated: Secondary | ICD-10-CM | POA: Insufficient documentation

## 2016-05-07 DIAGNOSIS — I10 Essential (primary) hypertension: Secondary | ICD-10-CM | POA: Insufficient documentation

## 2016-05-07 MED ORDER — AEROCHAMBER PLUS FLO-VU MEDIUM MISC
1.0000 | Freq: Once | Status: AC
  Administered 2016-05-07: 1
  Filled 2016-05-07: qty 1

## 2016-05-07 MED ORDER — IPRATROPIUM BROMIDE 0.02 % IN SOLN
0.5000 mg | Freq: Once | RESPIRATORY_TRACT | Status: AC
  Administered 2016-05-07: 0.5 mg via RESPIRATORY_TRACT
  Filled 2016-05-07: qty 2.5

## 2016-05-07 MED ORDER — ALBUTEROL SULFATE HFA 108 (90 BASE) MCG/ACT IN AERS
2.0000 | INHALATION_SPRAY | RESPIRATORY_TRACT | Status: DC | PRN
  Administered 2016-05-07: 2 via RESPIRATORY_TRACT
  Filled 2016-05-07: qty 6.7

## 2016-05-07 MED ORDER — PREDNISONE 20 MG PO TABS
60.0000 mg | ORAL_TABLET | Freq: Once | ORAL | Status: AC
  Administered 2016-05-07: 60 mg via ORAL
  Filled 2016-05-07: qty 3

## 2016-05-07 MED ORDER — PREDNISONE 20 MG PO TABS: 40.0000 mg | ORAL_TABLET | Freq: Every day | ORAL | 0 refills | Status: DC

## 2016-05-07 MED ORDER — ALBUTEROL SULFATE (2.5 MG/3ML) 0.083% IN NEBU
5.0000 mg | INHALATION_SOLUTION | Freq: Once | RESPIRATORY_TRACT | Status: AC
  Administered 2016-05-07: 5 mg via RESPIRATORY_TRACT
  Filled 2016-05-07: qty 6

## 2016-05-07 NOTE — ED Notes (Signed)
PT DISCHARGED. INSTRUCTIONS AND PRESCRIPTIONS GIVEN. AAOX4. PT IN NO APPARENT DISTRESS OR PAIN. THE OPPORTUNITY TO ASK QUESTIONS WAS PROVIDED. 

## 2016-05-07 NOTE — Discharge Instructions (Signed)
1. Medications: albuterol, prednisone, usual home medications °2. Treatment: rest, drink plenty of fluids, begin OTC antihistamine (Zyrtec or Claritin)  °3. Follow Up: Please followup with your primary doctor in 2-3 days for discussion of your diagnoses and further evaluation after today's visit; if you do not have a primary care doctor use the resource guide provided to find one; Please return to the ER for difficulty breathing, high fevers or worsening symptoms. ° °

## 2016-05-07 NOTE — ED Triage Notes (Signed)
Pt reports having nonproductive cough for the last week. Pt was seen and dx with URI. Pt reports no relief with OTC medications for cough.

## 2016-05-07 NOTE — ED Provider Notes (Signed)
[facility] DEPT Provider Note   CSN: [id number] Arrival date & time: 05/07/16  2018   By signing my name below, I, Delton Prairie, attest that this documentation has been prepared under the direction and in the presence of  [employer], PA-C. Electronically Signed: Delton Prairie, ED Scribe. 05/07/16. 10:44 PM.   History   Chief Complaint Chief Complaint  Patient presents with  . Cough    HPI Comments:  Emily Saunders is a 41 y.o. female who presents to the Emergency Department complaining of gradual onset, persistent dry cough x 1 week. Pt also reports associated SOB and chest tightness. She notes her symptoms began about a week ago with URI symptoms which have since resolved. No alleviating or modifying factors noted. Pt denies fevers, leg swelling, congestion or any other associated symptoms. Pt also denies a hx of asthma, a hx of COPD, any recent travel or any recent sick contacts. Pt is a smoker and notes she smokes about half a pack a day. No other complaints noted.  The history is provided by the patient and medical records. No language interpreter was used.    Past Medical History:  Diagnosis Date  . H/O pilonidal cyst 2002   . Hypertension   . PID (acute pelvic inflammatory disease) 2006   . Pneumonia   . Vitamin D deficiency 2015     Patient Active Problem List   Diagnosis Date Noted  . HTN (hypertension) 02/27/2015  . Unintended weight loss 10/27/2014  . Allergic rhinitis 06/23/2014  . Elevated BP 06/23/2014  . Vitamin D insufficiency 03/03/2014  . Pilonidal cyst 03/03/2014  . Broken teeth 03/03/2014  . Poor sleep pattern 03/03/2014  . Obesity (BMI 30-39.9) 03/03/2014  . Current smoker 03/03/2014  . Anemia 05/27/2013    Past Surgical History:  Procedure Laterality Date  . CESAREAN SECTION    . CYST EXCISION Right 2003   hand    OB History    Gravida Para Term Preterm AB Living   6 4 4   2 4    SAB TAB Ectopic Multiple Live Births   1   1 0 4        Home Medications    Prior to Admission medications   Medication Sig Start Date End Date Taking? Authorizing Provider  amLODipine (NORVASC) 10 MG tablet Take 1 tablet (10 mg total) by mouth daily. 12/18/15   Rachelle A Denney, CNM  benzonatate (TESSALON) 100 MG capsule Take 1 capsule (100 mg total) by mouth every 8 (eight) hours. 05/01/16   Okey Regal, PA-C  cephALEXin (KEFLEX) 500 MG capsule Take 1 capsule (500 mg total) by mouth 3 (three) times daily. Patient not taking: Reported on 01/21/2016 01/12/16   Rachelle A Denney, CNM  fluticasone (FLONASE) 50 MCG/ACT nasal spray Place 1 spray into both nostrils daily. 05/01/16   Dellis Filbert Hedges, PA-C  HYDROcodone-ibuprofen (VICOPROFEN) 7.5-200 MG tablet Take 1 tablet by mouth every 8 (eight) hours as needed for moderate pain. Patient not taking: Reported on 04/14/2016 03/03/16   Shelly Bombard, MD  ibuprofen (ADVIL,MOTRIN) 600 MG tablet Take 1 tablet (600 mg total) by mouth every 8 (eight) hours as needed for cramping. Patient not taking: Reported on 04/14/2016 03/03/16   Shelly Bombard, MD  predniSONE (DELTASONE) 20 MG tablet Take 2 tablets (40 mg total) by mouth daily. 05/07/16   Jaquana Geiger, PA-C  Prenatal Vit-Fe Fumarate-FA (PRENATAL VITAMIN PO) Take 1 tablet by mouth daily.     Historical Provider, MD  senna-docusate (SENOKOT-S) 8.6-50 MG tablet Take 2 tablets by mouth 2 (two) times daily. Patient not taking: Reported on 01/13/2016 12/18/15   Morene Crocker, CNM    Family History Family History  Problem Relation Age of Onset  . Hypertension Mother   . Diabetes Mother   . Asthma Mother   . COPD Mother   . Depression Mother   . Miscarriages / [place] Mother   . Vision loss Mother   . Heart disease Mother   . Parkinson's disease Father   . Diabetes Maternal Aunt   . Diabetes Maternal Uncle   . Diabetes Cousin     Social History Social History  Substance Use Topics  . Smoking status: Current Some Day Smoker     Packs/day: 0.50    Years: 20.00    Types: Cigarettes  . Smokeless tobacco: Never Used  . Alcohol use No     Allergies   Patient has no known allergies.   Review of Systems Review of Systems  Constitutional: Negative for fever.  HENT: Negative for congestion.   Respiratory: Positive for cough, chest tightness and shortness of breath.   Cardiovascular: Negative for leg swelling.     Physical Exam Updated Vital Signs BP 117/82 (BP Location: Left Arm)   Pulse 78   Temp 98.2 F (36.8 C) (Oral)   Resp 16   Ht 5\' 6"  (1.676 m)   Wt 212 lb (96.2 kg)   LMP 04/23/2016   SpO2 96%   BMI 34.22 kg/m   Physical Exam  Constitutional: She appears well-developed and well-nourished. No distress.  HENT:  Head: Normocephalic and atraumatic.  Right Ear: Tympanic membrane, external ear and ear canal normal.  Left Ear: Tympanic membrane, external ear and ear canal normal.  Nose: No mucosal edema or rhinorrhea. No epistaxis. Right sinus exhibits no maxillary sinus tenderness and no frontal sinus tenderness. Left sinus exhibits no maxillary sinus tenderness and no frontal sinus tenderness.  Mouth/Throat: Uvula is midline and mucous membranes are normal. Mucous membranes are not pale and not cyanotic. No oropharyngeal exudate, posterior oropharyngeal edema, posterior oropharyngeal erythema or tonsillar abscesses.  Eyes: Conjunctivae are normal. Pupils are equal, round, and reactive to light.  Neck: Normal range of motion and full passive range of motion without pain.  Cardiovascular: Normal rate and intact distal pulses.   Pulmonary/Chest: Effort normal. No stridor. She has decreased breath sounds. She has wheezes. She has rhonchi.  Congested cough   Abdominal: Soft. There is no tenderness.  Musculoskeletal: Normal range of motion.  Lymphadenopathy:    She has no cervical adenopathy.  Neurological: She is alert.  Skin: Skin is warm and dry. No rash noted. She is not diaphoretic.    Psychiatric: She has a normal mood and affect.  Nursing note and vitals reviewed.    ED Treatments / Results  DIAGNOSTIC STUDIES:  Oxygen Saturation is 96% on RA, normal by my interpretation.    COORDINATION OF CARE:  10:36 PM Discussed treatment plan with pt at bedside and pt agreed to plan.  11:36 PM Rhonchi persists but wheezing has resolved. Improved tidal volume. Home with albuterol and prednisone.    Radiology Dg Chest 2 View  Result Date: 05/07/2016 CLINICAL DATA:  Acute onset of nonproductive cough. Initial encounter. EXAM: CHEST  2 VIEW COMPARISON:  Chest radiograph performed 12/06/2013 FINDINGS: The lungs are well-aerated and clear. There is no evidence of focal opacification, pleural effusion or pneumothorax. The heart is normal in size; the mediastinal contour  is within normal limits. No acute osseous abnormalities are seen. IMPRESSION: No acute cardiopulmonary process seen. Electronically Signed   By: Garald Balding M.D.   On: 05/07/2016 23:23    Procedures Procedures (including critical care time)  Medications Ordered in ED Medications  albuterol (PROVENTIL HFA;VENTOLIN HFA) 108 (90 Base) MCG/ACT inhaler 2 puff (2 puffs Inhalation Given 05/07/16 2357)  albuterol (PROVENTIL) (2.5 MG/3ML) 0.083% nebulizer solution 5 mg (5 mg Nebulization Given 05/07/16 2258)  ipratropium (ATROVENT) nebulizer solution 0.5 mg (0.5 mg Nebulization Given 05/07/16 2258)  predniSONE (DELTASONE) tablet 60 mg (60 mg Oral Given 05/07/16 2258)  AEROCHAMBER PLUS FLO-VU MEDIUM MISC 1 each (1 each Other Given 05/07/16 2358)     Initial Impression / Assessment and Plan / ED Course  I have reviewed the triage vital signs and the nursing notes.  Pertinent labs & imaging results that were available during my care of the patient were reviewed by me and considered in my medical decision making (see chart for details).     Patient presents with wheezing and persistent congestion after URI. Suspect  viral bronchitis. Chest x-ray is without evidence of pneumonia. Patient is afebrile. She has no history of asthma however was given albuterol here in the emergency department with significant improvement. Will be discharged home with same and prednisone. No hypoxia.    Final Clinical Impressions(s) / ED Diagnoses   Final diagnoses:  Cough  Bronchitis  Wheezing    New Prescriptions Discharge Medication List as of 05/07/2016 11:39 PM    START taking these medications   Details  predniSONE (DELTASONE) 20 MG tablet Take 2 tablets (40 mg total) by mouth daily., Starting Sat 05/07/2016, Print        I personally performed the services described in this documentation, which was scribed in my presence. The recorded information has been reviewed and is accurate.    Jarrett Soho Jackelynn Hosie, PA-C 05/08/16 0245    Virgel Manifold, MD 05/09/16 [phone]

## 2016-05-14 ENCOUNTER — Emergency Department (HOSPITAL_COMMUNITY)
Admission: EM | Admit: 2016-05-14 | Discharge: 2016-05-14 | Disposition: A | Discharge status: Home / Self Care | Payer: Self-pay | Attending: Emergency Medicine | Admitting: Emergency Medicine

## 2016-05-14 ENCOUNTER — Encounter (HOSPITAL_COMMUNITY): Payer: Self-pay

## 2016-05-14 DIAGNOSIS — J40 Bronchitis, not specified as acute or chronic: Secondary | ICD-10-CM | POA: Insufficient documentation

## 2016-05-14 DIAGNOSIS — F17210 Nicotine dependence, cigarettes, uncomplicated: Secondary | ICD-10-CM | POA: Insufficient documentation

## 2016-05-14 DIAGNOSIS — I10 Essential (primary) hypertension: Secondary | ICD-10-CM | POA: Insufficient documentation

## 2016-05-14 DIAGNOSIS — Z720 Tobacco use: Secondary | ICD-10-CM

## 2016-05-14 MED ORDER — PREDNISONE 10 MG PO TABS: ORAL_TABLET | ORAL | 0 refills | Status: DC

## 2016-05-14 MED ORDER — BENZONATATE 100 MG PO CAPS: 100.0000 mg | ORAL_CAPSULE | Freq: Three times a day (TID) | ORAL | 0 refills | Status: DC

## 2016-05-14 MED ORDER — AZITHROMYCIN 250 MG PO TABS
250.0000 mg | ORAL_TABLET | Freq: Every day | ORAL | 0 refills | Status: DC
Start: 2016-05-14 — End: 2017-02-21

## 2016-05-14 NOTE — ED Provider Notes (Signed)
[place] DEPT Provider Note   CSN: [id number] Arrival date & time: 05/14/16  0147     History   Chief Complaint Chief Complaint  Patient presents with  . URI    HPI Emily Saunders is a 41 y.o. female.  Patient returns to the emergency room with persistent cough. Seen and evaluated on 05/01/16 and 05/07/16 for dry cough, chest soreness from coughing without fever, congestion, vomiting. She was placed on steroids without relief. She is a continuous smoker. No history of asthma or use of inhalers.    The history is provided by the patient. No language interpreter was used.  URI   Associated symptoms include cough. Pertinent negatives include no chest pain, no vomiting, no congestion and no rash.    Past Medical History:  Diagnosis Date  . H/O pilonidal cyst 2002   . Hypertension   . PID (acute pelvic inflammatory disease) 2006   . Pneumonia   . Vitamin D deficiency 2015     Patient Active Problem List   Diagnosis Date Noted  . HTN (hypertension) 02/27/2015  . Unintended weight loss 10/27/2014  . Allergic rhinitis 06/23/2014  . Elevated BP 06/23/2014  . Vitamin D insufficiency 03/03/2014  . Pilonidal cyst 03/03/2014  . Broken teeth 03/03/2014  . Poor sleep pattern 03/03/2014  . Obesity (BMI 30-39.9) 03/03/2014  . Current smoker 03/03/2014  . Anemia 05/27/2013    Past Surgical History:  Procedure Laterality Date  . CESAREAN SECTION    . CYST EXCISION Right 2003   hand    OB History    Gravida Para Term Preterm AB Living   6 4 4   2 4    SAB TAB Ectopic Multiple Live Births   1   1 0 4       Home Medications    Prior to Admission medications   Medication Sig Start Date End Date Taking? Authorizing Provider  amLODipine (NORVASC) 10 MG tablet Take 1 tablet (10 mg total) by mouth daily. 12/18/15   Rachelle A Denney, CNM  benzonatate (TESSALON) 100 MG capsule Take 1 capsule (100 mg total) by mouth every 8 (eight) hours. 05/01/16   Okey Regal, PA-C    cephALEXin (KEFLEX) 500 MG capsule Take 1 capsule (500 mg total) by mouth 3 (three) times daily. Patient not taking: Reported on 01/21/2016 01/12/16   Rachelle A Denney, CNM  fluticasone (FLONASE) 50 MCG/ACT nasal spray Place 1 spray into both nostrils daily. 05/01/16   Dellis Filbert Hedges, PA-C  HYDROcodone-ibuprofen (VICOPROFEN) 7.5-200 MG tablet Take 1 tablet by mouth every 8 (eight) hours as needed for moderate pain. Patient not taking: Reported on 04/14/2016 03/03/16   Shelly Bombard, MD  ibuprofen (ADVIL,MOTRIN) 600 MG tablet Take 1 tablet (600 mg total) by mouth every 8 (eight) hours as needed for cramping. Patient not taking: Reported on 04/14/2016 03/03/16   Shelly Bombard, MD  predniSONE (DELTASONE) 20 MG tablet Take 2 tablets (40 mg total) by mouth daily. 05/07/16   Hannah Muthersbaugh, PA-C  Prenatal Vit-Fe Fumarate-FA (PRENATAL VITAMIN PO) Take 1 tablet by mouth daily.     Historical Provider, MD  senna-docusate (SENOKOT-S) 8.6-50 MG tablet Take 2 tablets by mouth 2 (two) times daily. Patient not taking: Reported on 01/13/2016 12/18/15   Morene Crocker, CNM    Family History Family History  Problem Relation Age of Onset  . Hypertension Mother   . Diabetes Mother   . Asthma Mother   . COPD Mother   .  Depression Mother   . Miscarriages / [place] Mother   . Vision loss Mother   . Heart disease Mother   . Parkinson's disease Father   . Diabetes Maternal Aunt   . Diabetes Maternal Uncle   . Diabetes Cousin     Social History Social History  Substance Use Topics  . Smoking status: Current Some Day Smoker    Packs/day: 0.50    Years: 20.00    Types: Cigarettes  . Smokeless tobacco: Never Used  . Alcohol use No     Allergies   Patient has no known allergies.   Review of Systems Review of Systems  Constitutional: Negative for chills and fever.  HENT: Negative.  Negative for congestion and trouble swallowing.   Respiratory: Positive for cough and chest tightness.    Cardiovascular: Negative.  Negative for chest pain.  Gastrointestinal: Negative.  Negative for vomiting.  Musculoskeletal: Negative.  Negative for myalgias.  Skin: Negative.  Negative for rash.  Neurological: Negative.  Negative for weakness.     Physical Exam Updated Vital Signs BP (!) 147/87 (BP Location: Right Arm)   Pulse 85   Temp 98.6 F (37 C) (Oral)   Resp 20   LMP 04/23/2016   SpO2 96%   Physical Exam  Constitutional: She is oriented to person, place, and time. She appears well-developed and well-nourished.  HENT:  Head: Normocephalic.  Neck: Normal range of motion. Neck supple.  Cardiovascular: Normal rate and regular rhythm.   Pulmonary/Chest: Effort normal and breath sounds normal. She has no wheezes.  Actively coughing dry, wheezing cough. Diffuse rales.  Abdominal: Soft. Bowel sounds are normal. There is no tenderness. There is no rebound and no guarding.  Musculoskeletal: Normal range of motion.  Neurological: She is alert and oriented to person, place, and time.  Skin: Skin is warm and dry. No rash noted.  Psychiatric: She has a normal mood and affect.     ED Treatments / Results  Labs (all labs ordered are listed, but only abnormal results are displayed) Labs Reviewed - No data to display  EKG  EKG Interpretation None       Radiology No results found.  Procedures Procedures (including critical care time)  Medications Ordered in ED Medications - No data to display   Initial Impression / Assessment and Plan / ED Course  I have reviewed the triage vital signs and the nursing notes.  Pertinent labs & imaging results that were available during my care of the patient were reviewed by me and considered in my medical decision making (see chart for details).     Patient with persistent cough, without fever. Continues to smoke. VSS, no hypoxia. Lungs with diffuse rhonchi. Negative CXR on 05/07/16.   She does not appear ill and toxic. Will  provide taper dose steroids, abx at this point. Strongly encouraged PCP follow up.  Final Clinical Impressions(s) / ED Diagnoses   Final diagnoses:  None   1. Bronchitis  New Prescriptions New Prescriptions   No medications on file     Dennie Bible 05/14/16 0322    Lacretia Leigh, MD 05/14/16 [phone]

## 2016-05-14 NOTE — ED Triage Notes (Signed)
Pt returns w/ worsening URI s/s. Pt reports no relief with OTC medications for cough.

## 2016-05-16 ENCOUNTER — Telehealth: Payer: Self-pay | Admitting: *Deleted

## 2016-05-16 NOTE — Telephone Encounter (Signed)
Pt called to office needing refill on BP medication, Norvasc. Pt states she has no more refills at pharmacy. Pt currently has no insurance but will pay cash, please use [facility]. Pt advised to follow up regarding insurance in order to establish appt with PCP for BP med management. Pt made aware request will be sent to provider today for refills.     Please advise on Refill.

## 2016-05-17 ENCOUNTER — Other Ambulatory Visit: Payer: Self-pay | Admitting: Obstetrics

## 2016-05-17 DIAGNOSIS — I10 Essential (primary) hypertension: Secondary | ICD-10-CM

## 2016-05-17 MED ORDER — AMLODIPINE BESYLATE 10 MG PO TABS: 10.0000 mg | ORAL_TABLET | Freq: Every day | ORAL | 11 refills | Status: DC

## 2016-05-17 MED ORDER — HYDROCHLOROTHIAZIDE 25 MG PO TABS: 25.0000 mg | ORAL_TABLET | Freq: Every day | ORAL | 11 refills | Status: DC

## 2016-05-17 NOTE — Telephone Encounter (Signed)
Norvasc and HCTZ Rx

## 2016-05-17 NOTE — Telephone Encounter (Signed)
LM on VM making pt aware Rx has been sent by provider.

## 2016-05-27 ENCOUNTER — Ambulatory Visit: Payer: Self-pay | Admitting: Obstetrics

## 2016-06-22 ENCOUNTER — Ambulatory Visit: Payer: Self-pay | Admitting: Obstetrics

## 2016-06-23 ENCOUNTER — Ambulatory Visit: Payer: Self-pay | Admitting: Obstetrics

## 2016-07-18 ENCOUNTER — Ambulatory Visit (INDEPENDENT_AMBULATORY_CARE_PROVIDER_SITE_OTHER): Payer: Self-pay | Admitting: Obstetrics

## 2016-07-18 ENCOUNTER — Encounter: Payer: Self-pay | Admitting: Obstetrics

## 2016-07-18 VITALS — BP 136/101 | HR 81 | Wt 226.8 lb

## 2016-07-18 DIAGNOSIS — N939 Abnormal uterine and vaginal bleeding, unspecified: Secondary | ICD-10-CM

## 2016-07-18 DIAGNOSIS — I10 Essential (primary) hypertension: Secondary | ICD-10-CM

## 2016-07-18 DIAGNOSIS — N946 Dysmenorrhea, unspecified: Secondary | ICD-10-CM

## 2016-07-18 NOTE — Progress Notes (Signed)
Subjective:    Emily Saunders is a 41 y.o. female who presents for contraception counseling.  History of irregular vagina bleeding after IUD Insertion.  The bleeding has ceased.l The patient has no complaints today. The patient is not sexually active. Pertinent past medical history: current smoker and hypertension.  The information documented in the HPI was reviewed and verified.  Menstrual History: OB History    Gravida Para Term Preterm AB Living   6 4 4   2 4    SAB TAB Ectopic Multiple Live Births   1   1 0 4       Patient's last menstrual period was 06/13/2016.   Patient Active Problem List   Diagnosis Date Noted  . HTN (hypertension) 02/27/2015  . Unintended weight loss 10/27/2014  . Allergic rhinitis 06/23/2014  . Elevated BP 06/23/2014  . Vitamin D insufficiency 03/03/2014  . Pilonidal cyst 03/03/2014  . Broken teeth 03/03/2014  . Poor sleep pattern 03/03/2014  . Obesity (BMI 30-39.9) 03/03/2014  . Current smoker 03/03/2014  . Anemia 05/27/2013   Past Medical History:  Diagnosis Date  . H/O pilonidal cyst 2002   . Hypertension   . PID (acute pelvic inflammatory disease) 2006   . Pneumonia   . Vitamin D deficiency 2015     Past Surgical History:  Procedure Laterality Date  . CESAREAN SECTION    . CYST EXCISION Right 2003   hand     Current Outpatient Prescriptions:  .  amLODipine (NORVASC) 10 MG tablet, Take 1 tablet (10 mg total) by mouth daily., Disp: 30 tablet, Rfl: 11 .  azithromycin (ZITHROMAX) 250 MG tablet, Take 1 tablet (250 mg total) by mouth daily. Take first 2 tablets together, then 1 every day until finished., Disp: 6 tablet, Rfl: 0 .  benzonatate (TESSALON) 100 MG capsule, Take 1 capsule (100 mg total) by mouth every 8 (eight) hours., Disp: 21 capsule, Rfl: 0 .  cephALEXin (KEFLEX) 500 MG capsule, Take 1 capsule (500 mg total) by mouth 3 (three) times daily. (Patient not taking: Reported on 01/21/2016), Disp: 21 capsule, Rfl: 0 .  fluticasone  (FLONASE) 50 MCG/ACT nasal spray, Place 1 spray into both nostrils daily., Disp: 9.9 g, Rfl: 2 .  hydrochlorothiazide (HYDRODIURIL) 25 MG tablet, Take 1 tablet (25 mg total) by mouth daily., Disp: 30 tablet, Rfl: 11 .  HYDROcodone-ibuprofen (VICOPROFEN) 7.5-200 MG tablet, Take 1 tablet by mouth every 8 (eight) hours as needed for moderate pain. (Patient not taking: Reported on 04/14/2016), Disp: 30 tablet, Rfl: 0 .  ibuprofen (ADVIL,MOTRIN) 600 MG tablet, Take 1 tablet (600 mg total) by mouth every 8 (eight) hours as needed for cramping. (Patient not taking: Reported on 04/14/2016), Disp: 30 tablet, Rfl: 5 .  predniSONE (DELTASONE) 10 MG tablet, Take 6 on days 1 and 2 Take 5 on days 3 and 4 Take 4 on days 5 and 6 Take 3 on days 7 and 8 Take 2 on days 9 and 10 Take 1 on days 11 and 12, Disp: 42 tablet, Rfl: 0 .  Prenatal Vit-Fe Fumarate-FA (PRENATAL VITAMIN PO), Take 1 tablet by mouth daily. , Disp: , Rfl:  .  senna-docusate (SENOKOT-S) 8.6-50 MG tablet, Take 2 tablets by mouth 2 (two) times daily. (Patient not taking: Reported on 01/13/2016), Disp: 90 tablet, Rfl: 2 No Known Allergies  Social History  Substance Use Topics  . Smoking status: Current Some Day Smoker    Packs/day: 0.50    Years: 20.00  Types: Cigarettes  . Smokeless tobacco: Never Used  . Alcohol use No    Family History  Problem Relation Age of Onset  . Hypertension Mother   . Diabetes Mother   . Asthma Mother   . COPD Mother   . Depression Mother   . Miscarriages / [place] Mother   . Vision loss Mother   . Heart disease Mother   . Parkinson's disease Father   . Diabetes Maternal Aunt   . Diabetes Maternal Uncle   . Diabetes Cousin        Review of Systems Constitutional: negative for weight loss Genitourinary:negative for abnormal menstrual periods and vaginal discharge   Objective:   BP (!) 136/101   Pulse 81   Wt 226 lb 12.8 oz (102.9 kg)   LMP 06/13/2016   Breastfeeding? No   BMI 36.61 kg/m     General:   alert  Skin:   no rash or abnormalities  Lungs:   clear to auscultation bilaterally  Heart:   regular rate and rhythm, S1, S2 normal, no murmur, click, rub or gallop  Breasts:   normal without suspicious masses, skin or nipple changes or axillary nodes  Abdomen:  normal findings: no organomegaly, soft, non-tender and no hernia  Pelvis:  External genitalia: normal general appearance Urinary system: urethral meatus normal and bladder without fullness, nontender Vaginal: normal without tenderness, induration or masses Cervix: normal appearance Adnexa: normal bimanual exam Uterus: anteverted and non-tender, normal size   Lab Review Urine pregnancy test Labs reviewed yes Radiologic studies reviewed no  >50% of 10 min visit spent on counseling and coordination of care.    Assessment:    41 y.o., continuing ParaGuard IUD, no contraindications.   Plan:    All questions answered. Contraception: IUD. Discussed healthy lifestyle modifications. Follow up in 3 months.  No orders of the defined types were placed in this encounter.  No orders of the defined types were placed in this encounter.

## 2016-07-18 NOTE — Progress Notes (Signed)
Patient presents for abnormal bleeding 5/7-07/10/2016 with IUD.

## 2016-07-20 IMAGING — US US MFM OB DETAIL+14 WK
1 series · 14 of 28 positions shown · non-contrast
Comparison: none

[Series 1: us mfm ob detail+14 wk · 78 acquisitions, 14 frames shown]
[im 3/78]
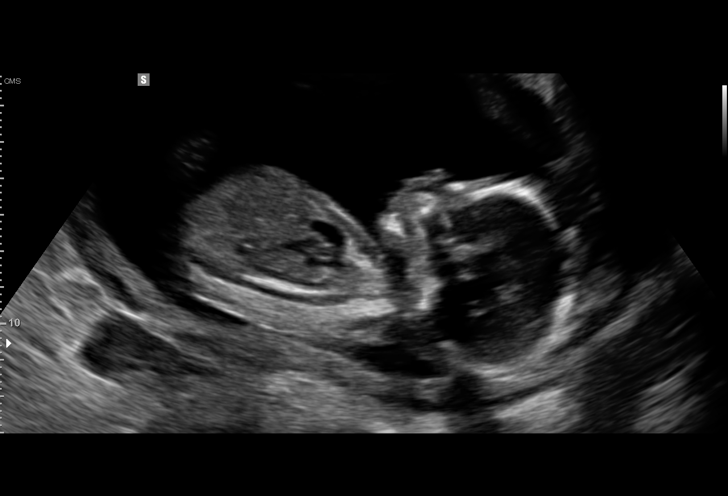
[im 9/78]
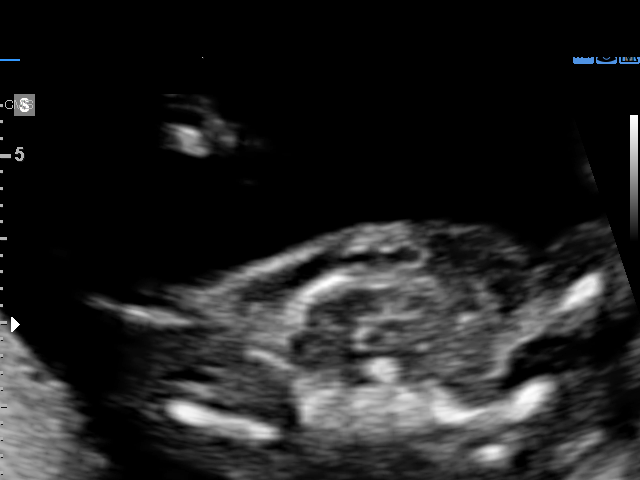
[im 15/78]
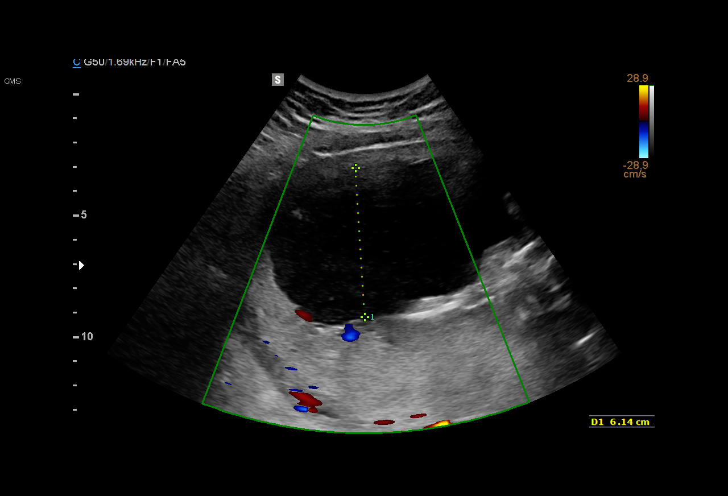
[im 20/78]
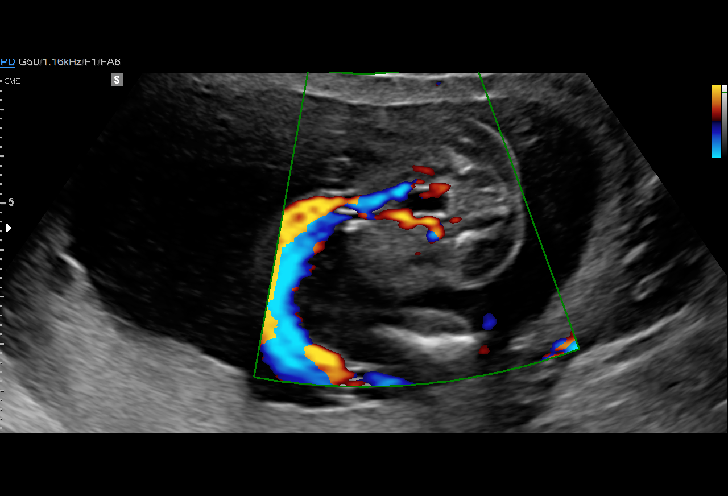
[im 26/78]
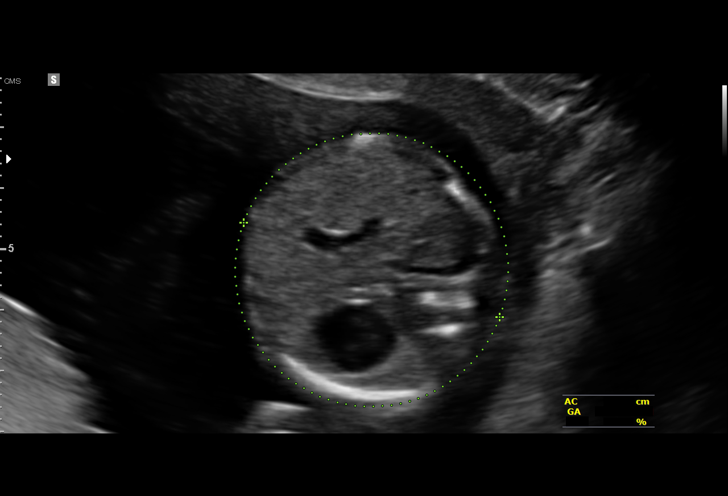
[im 32/78]
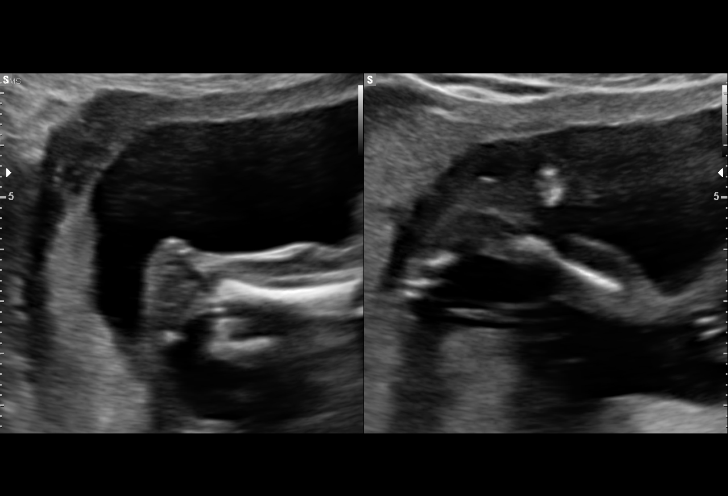
[im 38/78]
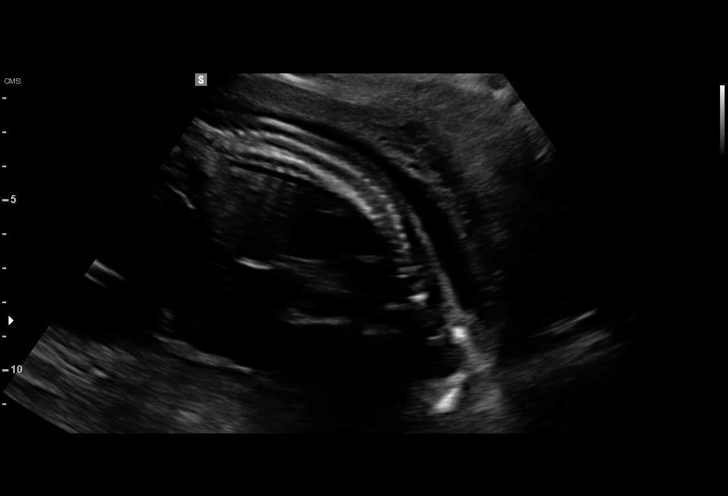
[im 43/78]
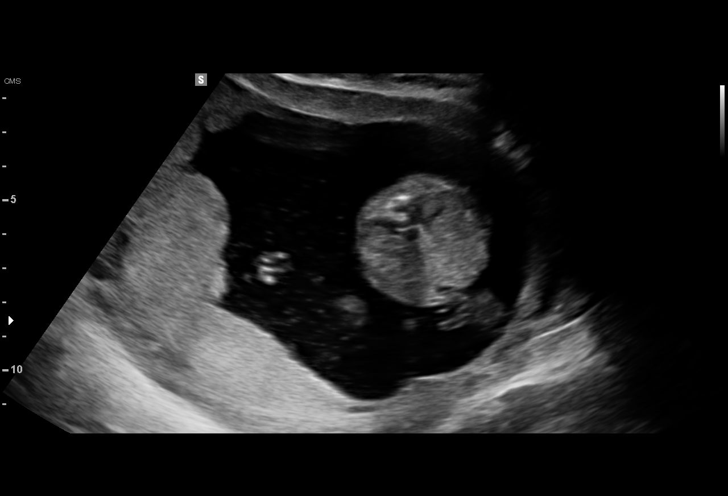
[im 49/78]
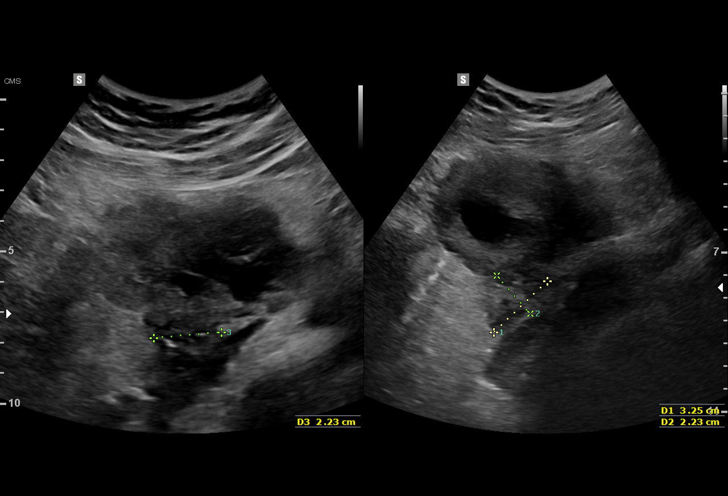
[im 55/78]
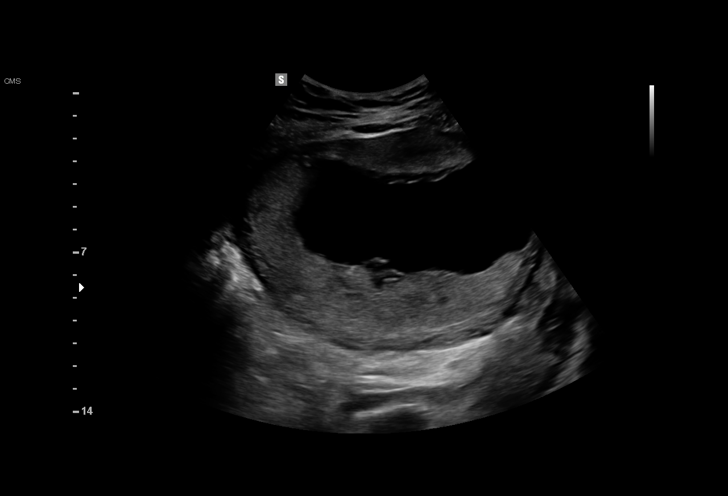
[im 60/78]
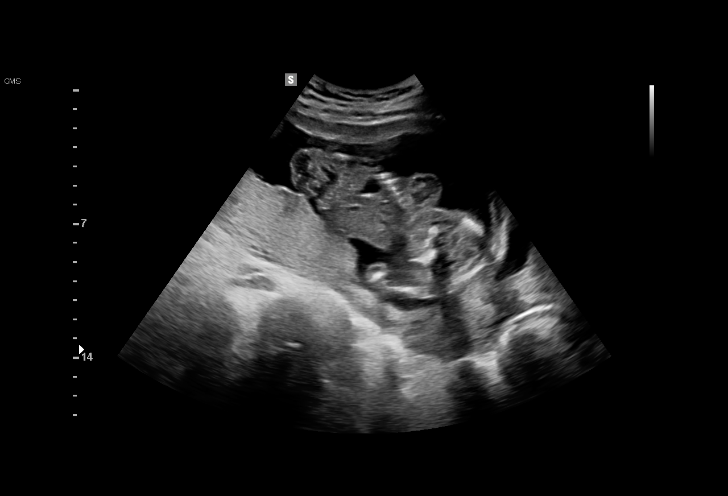
[im 66/78]
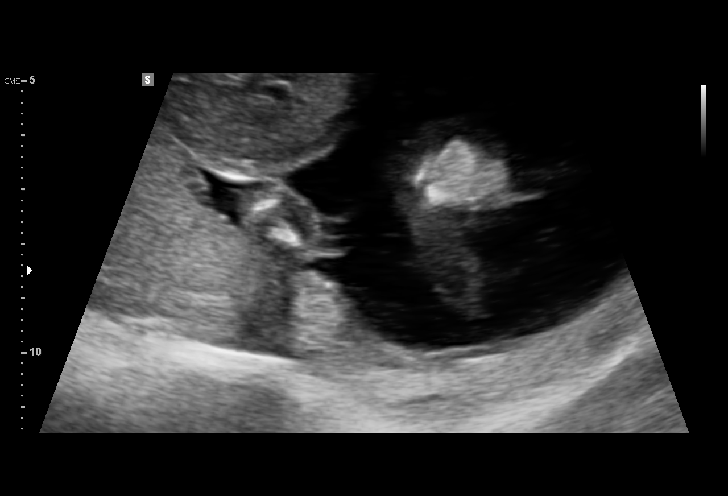
[im 72/78]
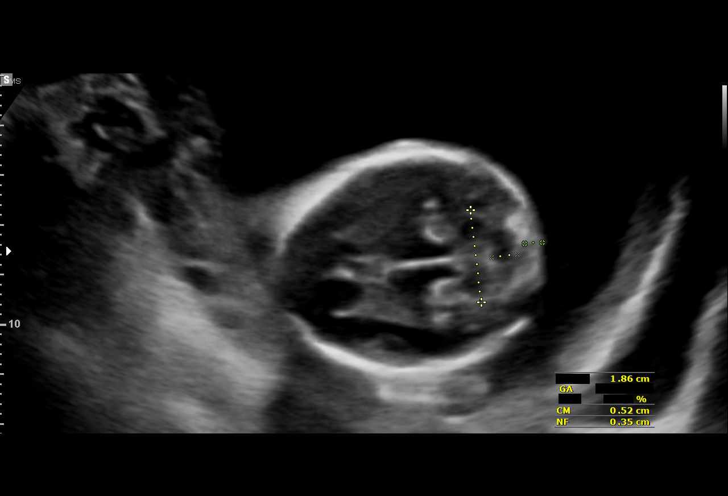
[im 78/78]
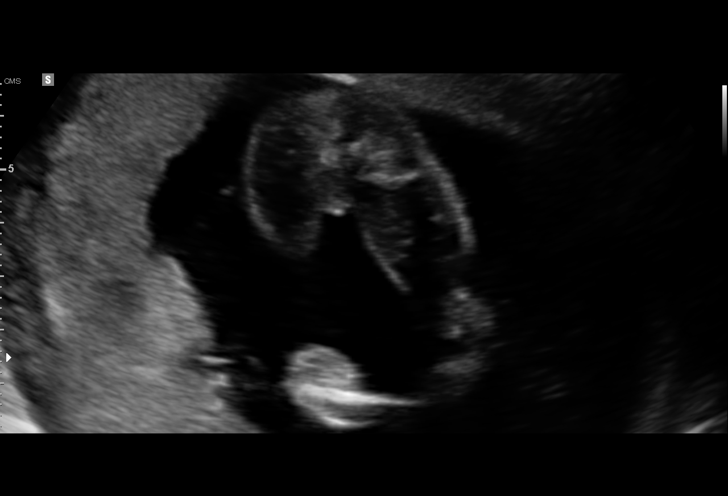

[14 of 28 positions shown; findings below may reference images not displayed]

[REDACTED]. [HOSPITAL],

1  ASDDAS APAN            [id number]      [id number]     [id number]
Indications

18 weeks gestation of pregnancy
Advanced maternal age multigravida, 40,
second trimester (low risk Panorama)
Hypertension - Chronic/Pre-existing
Previous cesarean delivery, antepartum
Obesity complicating pregnancy, second
trimester
OB History

Blood Type:            Height:  5'6"   Weight (lb):  230      BMI:
Gravidity:    6         Term:   3         SAB:   2
Ectopic:      1        Living:  3
Fetal Evaluation

Num Of Fetuses:     1
Fetal Heart         127
Rate(bpm):
Cardiac Activity:   Observed
Presentation:       Cephalic
Placenta:           Posterior, above cervical os
P. Cord Insertion:  Visualized, central

Amniotic Fluid
AFI FV:      Subjectively within normal limits

Largest Pocket(cm)
6.14
Biometry
BPD:      43.1  mm     G. Age:  19w 0d         65  %    CI:        70.54   %   70 - 86
FL/HC:      16.7   %   16.1 -
HC:      163.6  mm     G. Age:  19w 1d         62  %    HC/AC:      1.18       1.09 -
AC:      138.9  mm     G. Age:  19w 2d         65  %    FL/BPD:     63.3   %
FL:       27.3  mm     G. Age:  18w 2d         31  %    FL/AC:      19.7   %   20 - 24
CER:      18.6  mm     G. Age:  18w 2d         36  %
NFT:       3.5  mm

CM:        5.2  mm
Est. FW:     262  gm      0 lb 9 oz     49  %
Gestational Age

LMP:           18w 5d       Date:   03/18/15                 EDD:   12/23/15
U/S Today:     19w 0d                                        EDD:   12/21/15
Best:          18w 5d    Det. By:   LMP  (03/18/15)          EDD:   12/23/15
Anatomy

Cranium:               Appears normal         Aortic Arch:            Appears normal
Cavum:                 Appears normal         Ductal Arch:            Appears normal
Ventricles:            Appears normal         Diaphragm:              Appears normal
Choroid Plexus:        Appears normal         Stomach:                Appears normal, left
sided
Cerebellum:            Appears normal         Abdomen:                Appears normal
Posterior Fossa:       Appears normal         Abdominal Wall:         Appears nml (cord
insert, abd wall)
Nuchal Fold:           Appears normal         Cord Vessels:           Appears normal (3
vessel cord)
Face:                  Appears normal         Kidneys:                Appear normal
(orbits and profile)
Lips:                  Appears normal         Bladder:                Appears normal
Thoracic:              Appears normal         Spine:                  Appears normal
Heart:                 Appears normal         Upper Extremities:      Appears normal
(4CH, axis, and
situs)
RVOT:                  Appears normal         Lower Extremities:      Appears normal
LVOT:                  Appears normal

Other:  Parents do not wish to know sex of fetus today. Fetus appears to be a
female. Heels and 5th digit visualized. Nasal bone visualized.
Cervix Uterus Adnexa

Cervix
Length:            3.1  cm.
Normal appearance by transabdominal scan.

Uterus
No abnormality visualized.

Left Ovary
Within normal limits.

Right Ovary
Within normal limits.

Cul De Sac:   No free fluid seen.

Adnexa:       No abnormality visualized.
Impression

Single IUP at 18w 5d
AMA, Chronic hypertension - cell free fetal DNA low risk for
aneuploidy
Normal fetal anatomic survey
No markers associated with aneuploidy noted
Ultrasound measurements are consistent with LMP
Normal amniotic fluid volume
Recommendations

Recommend follow-up ultrasound examination in 6 weeks for
growth

## 2016-09-02 ENCOUNTER — Emergency Department (HOSPITAL_COMMUNITY): Payer: Medicaid Other

## 2016-09-02 ENCOUNTER — Encounter (HOSPITAL_COMMUNITY): Payer: Self-pay | Admitting: Emergency Medicine

## 2016-09-02 ENCOUNTER — Emergency Department (HOSPITAL_COMMUNITY)
Admission: EM | Admit: 2016-09-02 | Discharge: 2016-09-02 | Disposition: A | Discharge status: Home / Self Care | Payer: Medicaid Other | Attending: Emergency Medicine | Admitting: Emergency Medicine

## 2016-09-02 DIAGNOSIS — I10 Essential (primary) hypertension: Secondary | ICD-10-CM | POA: Insufficient documentation

## 2016-09-02 DIAGNOSIS — R6884 Jaw pain: Secondary | ICD-10-CM

## 2016-09-02 DIAGNOSIS — F17210 Nicotine dependence, cigarettes, uncomplicated: Secondary | ICD-10-CM | POA: Insufficient documentation

## 2016-09-02 DIAGNOSIS — Z79899 Other long term (current) drug therapy: Secondary | ICD-10-CM | POA: Insufficient documentation

## 2016-09-02 LAB — BASIC METABOLIC PANEL
Anion gap: 9 (ref 5–15)
BUN: 14 mg/dL (ref 6–20)
CALCIUM: 9 mg/dL (ref 8.9–10.3)
CO2: 23 mmol/L (ref 22–32)
CREATININE: 0.81 mg/dL (ref 0.44–1.00)
Chloride: 109 mmol/L (ref 101–111)
GFR calc Af Amer: >60 mL/min (ref >60)
GLUCOSE: 88 mg/dL (ref 65–99)
Potassium: 3.7 mmol/L (ref 3.5–5.1)
SODIUM: 141 mmol/L (ref 135–145)

## 2016-09-02 LAB — CBC WITH DIFFERENTIAL/PLATELET
Basophils Absolute: 0 10*3/uL (ref 0.0–0.1)
Basophils Relative: 0 %
EOS ABS: 0.3 10*3/uL (ref 0.0–0.7)
EOS PCT: 4 %
HCT: 36.8 % (ref 36.0–46.0)
Hemoglobin: 12.6 g/dL (ref 12.0–15.0)
Lymphocytes Relative: 39 %
Lymphs Abs: 3 10*3/uL (ref 0.7–4.0)
MCH: 30.4 pg (ref 26.0–34.0)
MCHC: 34.2 g/dL (ref 30.0–36.0)
MCV: 88.7 fL (ref 78.0–100.0)
MONO ABS: 0.3 10*3/uL (ref 0.1–1.0)
MONOS PCT: 4 %
Neutro Abs: 4 10*3/uL (ref 1.7–7.7)
Neutrophils Relative %: 53 %
PLATELETS: 209 10*3/uL (ref 150–400)
RBC: 4.15 MIL/uL (ref 3.87–5.11)
RDW: 14 % (ref 11.5–15.5)
WBC: 7.6 10*3/uL (ref 4.0–10.5)

## 2016-09-02 MED ORDER — AMOXICILLIN-POT CLAVULANATE 875-125 MG PO TABS
1.0000 | ORAL_TABLET | Freq: Once | ORAL | Status: AC
  Administered 2016-09-02: 1 via ORAL
  Filled 2016-09-02: qty 1

## 2016-09-02 MED ORDER — OXYCODONE-ACETAMINOPHEN 5-325 MG PO TABS
1.0000 | ORAL_TABLET | Freq: Once | ORAL | Status: AC
Start: 2016-09-02 — End: 2016-09-02
  Administered 2016-09-02: 1 via ORAL
  Filled 2016-09-02: qty 1

## 2016-09-02 MED ORDER — AMOXICILLIN-POT CLAVULANATE 875-125 MG PO TABS: 1.0000 | ORAL_TABLET | Freq: Two times a day (BID) | ORAL | 0 refills | Status: AC

## 2016-09-02 NOTE — ED Triage Notes (Signed)
Pt reports upper jaw pain. Pt reports it hurts to open jaw.

## 2016-09-02 NOTE — ED Provider Notes (Signed)
[facility] DEPT Provider Note   CSN: [id number] Arrival date & time: 09/02/16  1520  By signing my name below, I, Marcello Moores, attest that this documentation has been prepared under the direction and in the presence of Khadeja Abt, PA-C. Electronically Signed: Marcello Moores, ED Scribe. 09/02/16. 5:06 PM.  History   Chief Complaint Chief Complaint  Patient presents with  . Jaw Pain   The history is provided by the patient. No language interpreter was used.   HPI Comments: Emily Saunders is a 41 y.o. female who presents to the Emergency Department complaining of persistent "shooting" upper left jaw pain with associated mild swelling and  HA that began a week ago. Opening her mouth exacerbates her pain. She denies recent injury and fall. She also denies drainage.The pt has not had similar pain in the past. She reports that she is edentulous in the area of her pain. She currently wears dentures. The pt has a PMHx of HTN and PNA. The pt denies activity change, chills, fever, sore throat, SOB, chest pain, abdominal pain, rash, and back pain.   Past Medical History:  Diagnosis Date  . H/O pilonidal cyst 2002   . Hypertension   . PID (acute pelvic inflammatory disease) 2006   . Pneumonia   . Vitamin D deficiency 2015     Patient Active Problem List   Diagnosis Date Noted  . HTN (hypertension) 02/27/2015  . Unintended weight loss 10/27/2014  . Allergic rhinitis 06/23/2014  . Elevated BP 06/23/2014  . Vitamin D insufficiency 03/03/2014  . Pilonidal cyst 03/03/2014  . Broken teeth 03/03/2014  . Poor sleep pattern 03/03/2014  . Obesity (BMI 30-39.9) 03/03/2014  . Current smoker 03/03/2014  . Anemia 05/27/2013    Past Surgical History:  Procedure Laterality Date  . CESAREAN SECTION    . CYST EXCISION Right 2003   hand    OB History    Gravida Para Term Preterm AB Living   6 4 4   2 4    SAB TAB Ectopic Multiple Live Births   1   1 0 4       Home  Medications    Prior to Admission medications   Medication Sig Start Date End Date Taking? Authorizing Provider  amLODipine (NORVASC) 10 MG tablet Take 1 tablet (10 mg total) by mouth daily. 05/17/16   Shelly Bombard, MD  amoxicillin-clavulanate (AUGMENTIN) 875-125 MG tablet Take 1 tablet by mouth every 12 (twelve) hours. 09/02/16 09/12/16  Timesha Cervantez A, PA-C  azithromycin (ZITHROMAX) 250 MG tablet Take 1 tablet (250 mg total) by mouth daily. Take first 2 tablets together, then 1 every day until finished. 05/14/16   Charlann Lange, PA-C  benzonatate (TESSALON) 100 MG capsule Take 1 capsule (100 mg total) by mouth every 8 (eight) hours. 05/14/16   Charlann Lange, PA-C  cephALEXin (KEFLEX) 500 MG capsule Take 1 capsule (500 mg total) by mouth 3 (three) times daily. Patient not taking: Reported on 01/21/2016 01/12/16   Kandis Cocking A, CNM  fluticasone (FLONASE) 50 MCG/ACT nasal spray Place 1 spray into both nostrils daily. 05/01/16   Hedges, Dellis Filbert, PA-C  hydrochlorothiazide (HYDRODIURIL) 25 MG tablet Take 1 tablet (25 mg total) by mouth daily. 05/17/16   Shelly Bombard, MD  HYDROcodone-ibuprofen (VICOPROFEN) 7.5-200 MG tablet Take 1 tablet by mouth every 8 (eight) hours as needed for moderate pain. Patient not taking: Reported on 04/14/2016 03/03/16   Shelly Bombard, MD  ibuprofen (ADVIL,MOTRIN) 600 MG tablet Take  1 tablet (600 mg total) by mouth every 8 (eight) hours as needed for cramping. Patient not taking: Reported on 04/14/2016 03/03/16   Shelly Bombard, MD  predniSONE (DELTASONE) 10 MG tablet Take 6 on days 1 and 2 Take 5 on days 3 and 4 Take 4 on days 5 and 6 Take 3 on days 7 and 8 Take 2 on days 9 and 10 Take 1 on days 11 and 12 05/14/16   Charlann Lange, PA-C  Prenatal Vit-Fe Fumarate-FA (PRENATAL VITAMIN PO) Take 1 tablet by mouth daily.     [provider]  senna-docusate (SENOKOT-S) 8.6-50 MG tablet Take 2 tablets by mouth 2 (two) times daily. Patient not taking: Reported  on 01/13/2016 12/18/15   Morene Crocker, CNM    Family History Family History  Problem Relation Age of Onset  . Hypertension Mother   . Diabetes Mother   . Asthma Mother   . COPD Mother   . Depression Mother   . Miscarriages / [place] Mother   . Vision loss Mother   . Heart disease Mother   . Parkinson's disease Father   . Diabetes Maternal Aunt   . Diabetes Maternal Uncle   . Diabetes Cousin     Social History Social History  Substance Use Topics  . Smoking status: Current Some Day Smoker    Packs/day: 0.50    Years: 20.00    Types: Cigarettes  . Smokeless tobacco: Never Used  . Alcohol use No     Allergies   Patient has no known allergies.  Review of Systems Review of Systems  Constitutional: Negative for activity change, chills and fever.  HENT: Positive for facial swelling. Negative for dental problem, sore throat and trouble swallowing.   Respiratory: Negative for shortness of breath.   Cardiovascular: Negative for chest pain.  Gastrointestinal: Negative for abdominal pain, nausea and vomiting.  Musculoskeletal: Negative for back pain.  Skin: Negative for rash.  Neurological: Positive for headaches.   Physical Exam Updated Vital Signs BP 130/76 (BP Location: Right Arm)   Pulse 79   Temp 98 F (36.7 C) (Oral)   Resp 18   SpO2 99%   Physical Exam  Constitutional: She appears well-developed and well-nourished. No distress.  HENT:  Head: Normocephalic.  Right Ear: A middle ear effusion is present.  Left Ear: Ear canal normal. No mastoid tenderness. Tympanic membrane is bulging. Tympanic membrane is not injected and not erythematous.  Nose: Right sinus exhibits no maxillary sinus tenderness and no frontal sinus tenderness. Left sinus exhibits no maxillary sinus tenderness and no frontal sinus tenderness.  Mouth/Throat: Uvula is midline, oropharynx is clear and moist and mucous membranes are normal. No oral lesions. There is trismus in the jaw.  Abnormal dentition. No dental abscesses or uvula swelling.  Maxillary teeth are edentulous. Dentures in place. No gross abscess. TTP over the left maxilla, lateral to the maxillary sinus. No focal maxillary sinus tenderness.   Eyes: Conjunctivae are normal.  Neck: Neck supple.  Cardiovascular: Normal rate, regular rhythm and normal heart sounds.  Exam reveals no gallop and no friction rub.   No murmur heard. Pulmonary/Chest: Effort normal. No respiratory distress. She has no wheezes. She has no rales.  Abdominal: Soft. She exhibits no distension.  Neurological: She is alert.  Skin: Skin is warm. No rash noted. She is not diaphoretic.  Psychiatric: Her behavior is normal.  Nursing note and vitals reviewed.  ED Treatments / Results   DIAGNOSTIC STUDIES: Oxygen Saturation  is 100% on RA, normal by my interpretation.   COORDINATION OF CARE: 8:16 PM-Discussed next steps with pt including a CT maxillofacial. Pt verbalized understanding and is agreeable with the plan.   Labs (all labs ordered are listed, but only abnormal results are displayed) Labs Reviewed  CBC WITH DIFFERENTIAL/PLATELET  BASIC METABOLIC PANEL    EKG  EKG Interpretation None       Radiology Ct Maxillofacial Wo Contrast  Result Date: 09/02/2016 CLINICAL DATA:  Left jaw pain. Persistent shooting upper left jaw pain associated with mild swelling and headache beginning a week ago. Opening the mouth exacerbates the pain. EXAM: CT MAXILLOFACIAL WITHOUT CONTRAST TECHNIQUE: Multidetector CT imaging of the maxillofacial structures was performed. Multiplanar CT image reconstructions were also generated. COMPARISON:  CT head 03/18/2004 FINDINGS: Osseous: There is a small focal bone erosion in the posterolateral wall of the left maxillary antrum just above the left maxilla. This is well-circumscribed and appears chronic. This could represent inflammatory process such is osteomyelitis. No expansile bone lesion or bone sclerosis  is identified. The frontal bones, nasal bones, orbital rims, zygomatic arches, pterygoid plates, mandibles, and temporomandibular joints appear intact. Multiple previous tooth extractions with previous dissection of all of the maxillary teeth. Orbits: The globes and extraocular muscles appear intact and symmetrical. Sinuses: Mucosal thickening in the left maxillary antrum. No acute air-fluid levels. Paranasal sinuses are otherwise clear. Visualized portions of mastoid air cells are clear. Soft tissues: No significant soft tissue swelling or infiltration. No loculated fluid collections. Soft tissue calcification beneath the left mandibular angle may represent a salivary gland or salivary duct stone. Salivary glands appear symmetrical. Limited intracranial: No acute abnormalities identified. IMPRESSION: 1. Focal bone erosion in the posterolateral wall of the left maxillary antrum may represent inflammatory process. Osteomyelitis not excluded. No specific bone lesion otherwise identified. 2. Mucosal thickening in the left maxillary antrum consistent with chronic sinusitis. No acute air-fluid levels. 3. Soft tissue calcification beneath the left mandibular angle may represent a salivary stone. Electronically Signed   By: Lucienne Capers M.D.   On: 09/02/2016 18:15    Procedures Procedures (including critical care time)  Medications Ordered in ED Medications  oxyCODONE-acetaminophen (PERCOCET/ROXICET) 5-325 MG per tablet 1 tablet (1 tablet Oral Given 09/02/16 1840)  amoxicillin-clavulanate (AUGMENTIN) 875-125 MG per tablet 1 tablet (1 tablet Oral Given 09/02/16 1957)     Initial Impression / Assessment and Plan / ED Course  I have reviewed the triage vital signs and the nursing notes.  Pertinent labs & imaging results that were available during my care of the patient were reviewed by me and considered in my medical decision making (see chart for details).     41 year old female for evaluation of left  maxillary pain and trismus. Afebrile. VSS. No glossal elevation, unilateral tonsillar swelling. No maxillary sinus tenderness. No evidence of retropharyngeal or peritonsillar abscess or Ludwig angina. The patient was discussed with Dr. Sherry Ruffing, attending physician. Consulted ENT after CT maxillofacial showed focal bony erosion of the posterolateral wall of the left maxillary antrum. Osteomyelitis not excluded. No leukocytosis. BMP not concerning. Spoke with Dr. Redmond Baseman who recommended Augmentin for likely chronic sinusitis and having the patient follow up in his office next week. Pain controlled in the ED with improvement of trismus and jaw ROM. Given prescription for antibiotics. Overall, appears well, nontoxic and appropriate for discharge.  Final Clinical Impressions(s) / ED Diagnoses   Final diagnoses:  Maxillary pain    New Prescriptions Discharge Medication List as of 09/02/2016  7:56 PM    START taking these medications   Details  amoxicillin-clavulanate (AUGMENTIN) 875-125 MG tablet Take 1 tablet by mouth every 12 (twelve) hours., Starting Fri 09/02/2016, Until Mon 09/12/2016, Print       I personally performed the services described in this documentation, which was scribed in my presence. The recorded information has been reviewed and is accurate.     Joanne Gavel, PA-C 09/02/16 2016    Tegeler, Gwenyth Allegra, MD 09/03/16 [phone]

## 2016-09-02 NOTE — Discharge Instructions (Signed)
Please take Augmentin twice daily for the next 10 days. If your symptoms do not improve, you can follow up with Dr. Redmond Baseman office in the mid to later part of next week. If you develop new or worsening symptoms including fever, chills, or if the skin of the face becomes hot and red, please return to the emergency department for reevaluation.  You can apply ice to any areas that are sore for 15-20 minutes up to 3-4 times a day. You can also take 800 mg of ibuprofen every 8 hours with food as needed for pain and inflammation control.

## 2016-09-06 ENCOUNTER — Telehealth: Payer: Self-pay

## 2016-09-06 NOTE — Telephone Encounter (Signed)
Pt states that her bp medication amlodipine 10 mg was only sent in for her to take 1 pill a day. She states that she should be taking 2 tabs a day. She states that she is running out of medication. Per Dr. Jodi Mourning pt is to only take 1 tab 10 mg daily. Pt needs to follow up with a pcp. Pt informed

## 2016-09-26 ENCOUNTER — Encounter: Payer: Medicaid Other | Admitting: Obstetrics

## 2016-12-01 IMAGING — US US MFM OB LIMITED
1 series · 13 of 13 positions shown · non-contrast
Comparison: none

[Series 1: us mfm ob limited · 13 acquisitions, 13 frames shown]
[im 1/13]
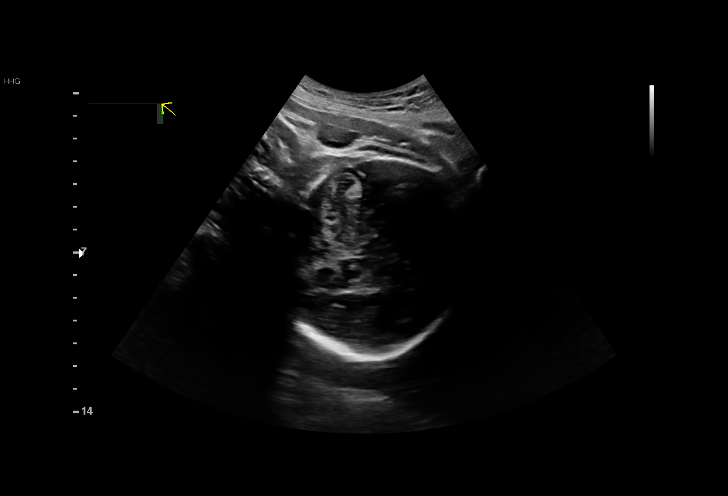
[im 2/13]
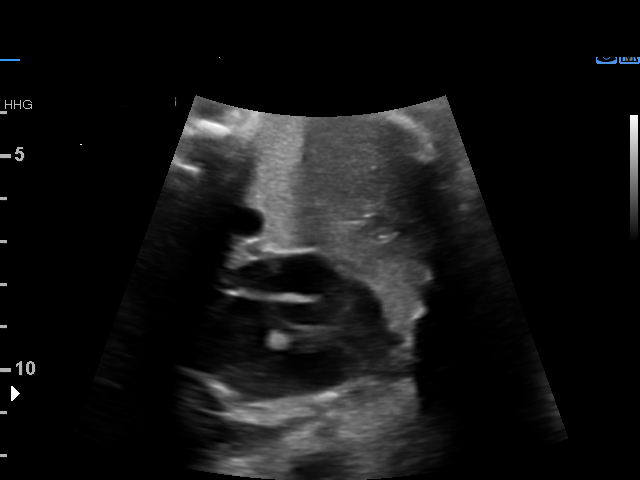
[im 3/13]
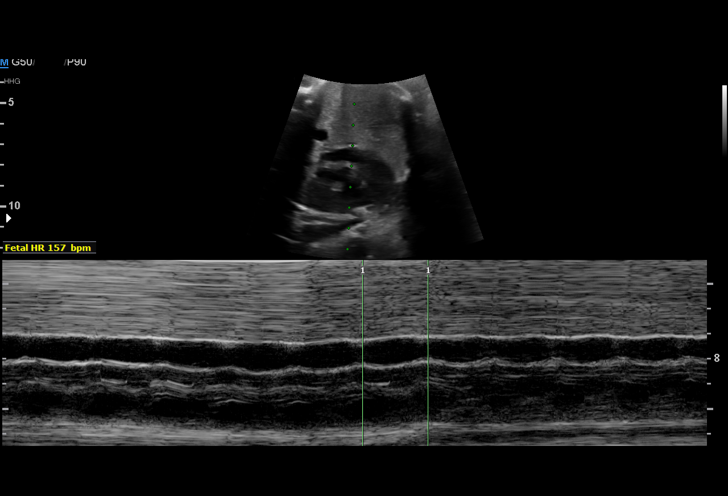
[im 4/13]
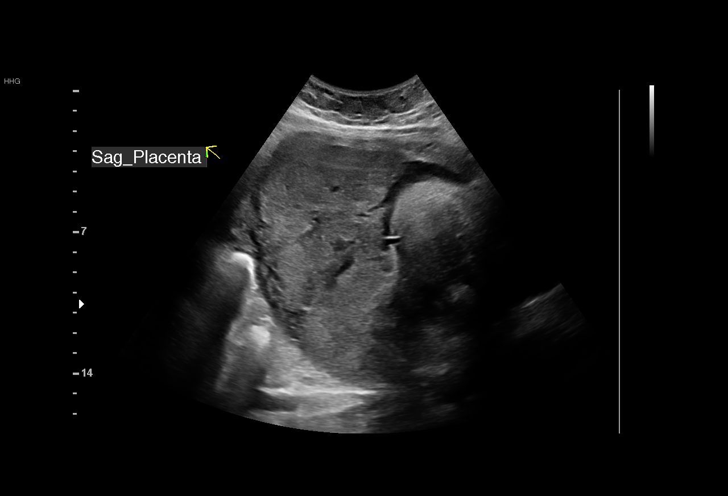
[im 5/13]
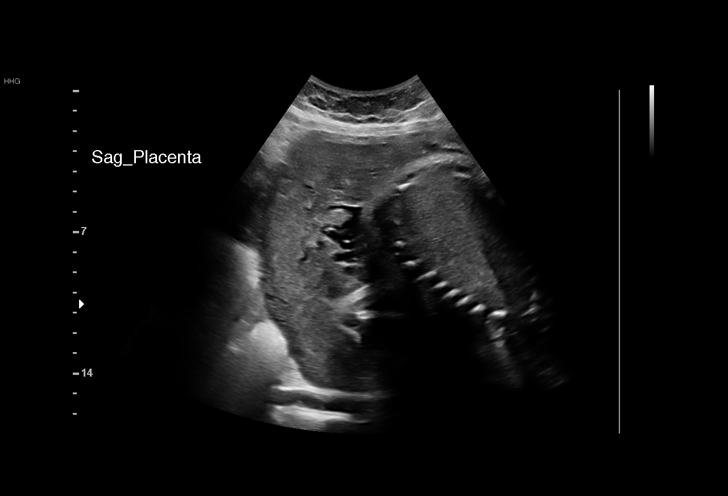
[im 6/13]
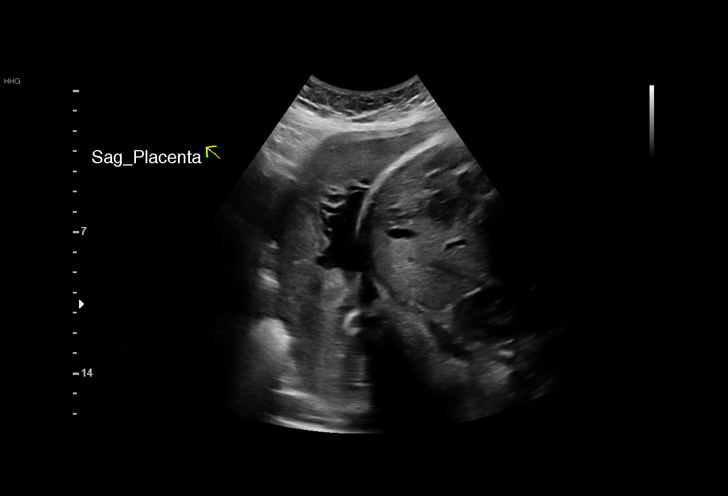
[im 7/13]
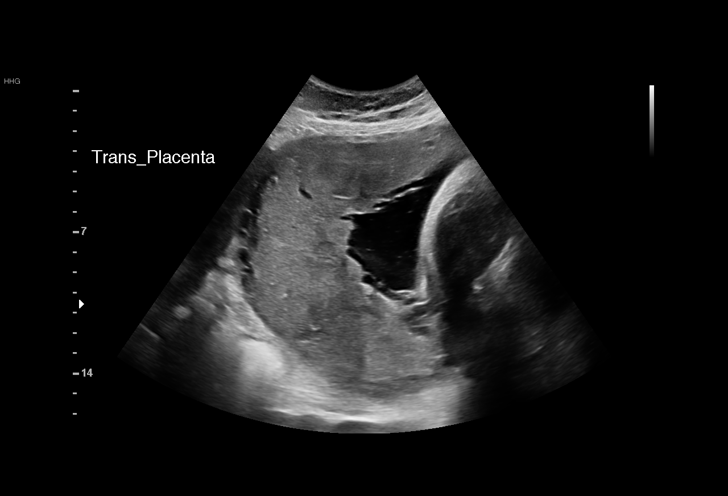
[im 8/13]
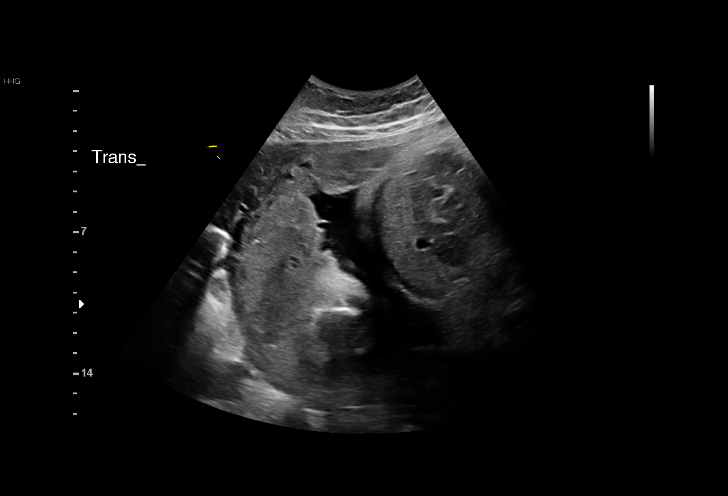
[im 9/13]
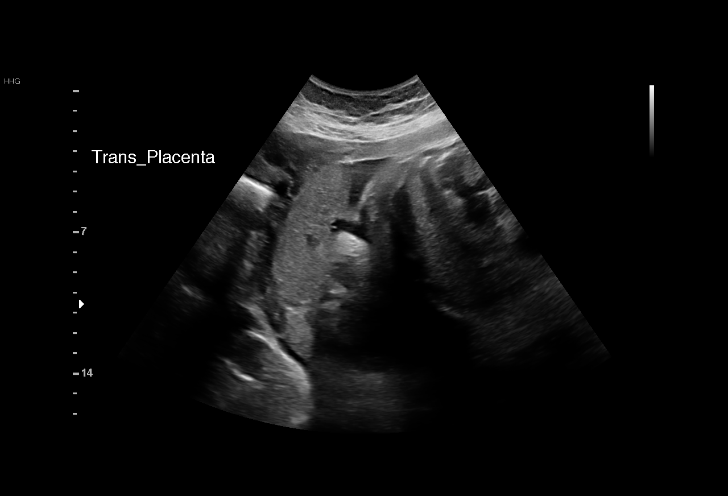
[im 10/13]
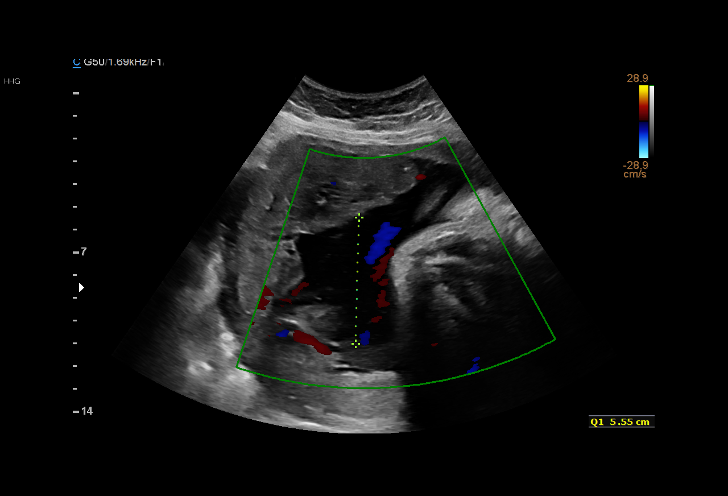
[im 11/13]
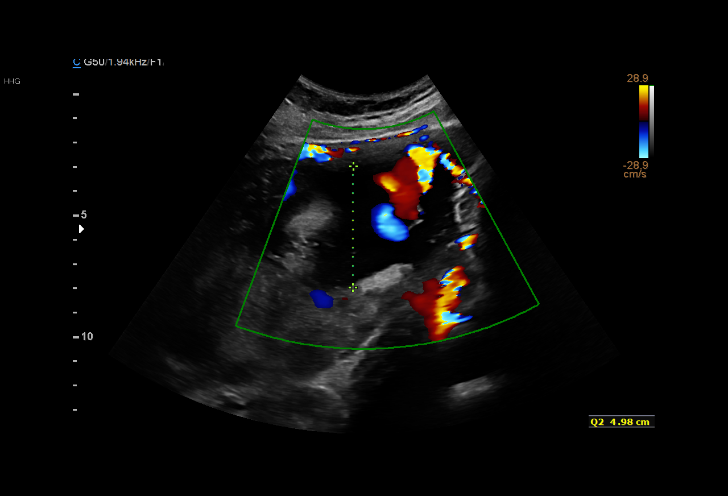
[im 12/13]
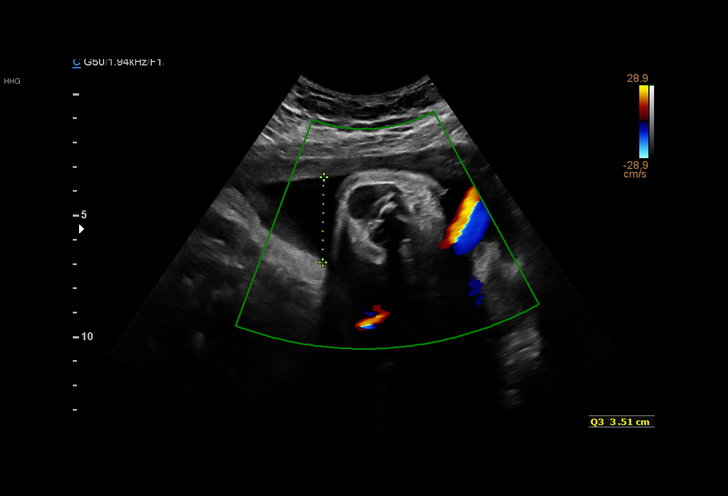
[im 13/13]
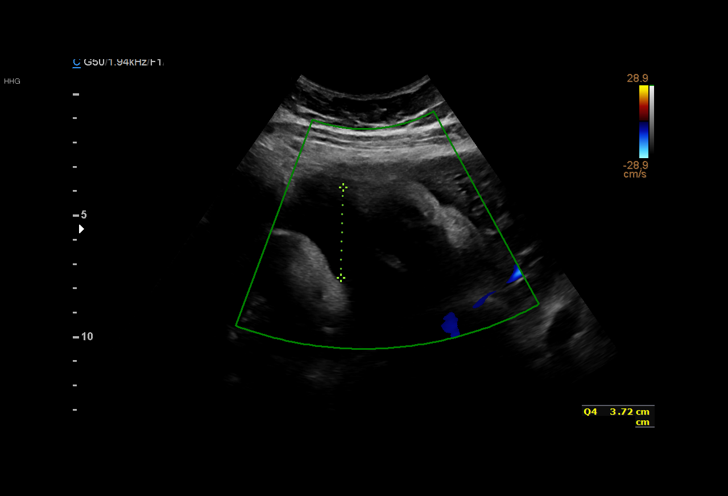

[13 of 13 positions shown; findings below may reference images not displayed]

[facility]

1  HOMAR FERDINAND            [id number]      [id number]     [id number]
Indications

37 weeks gestation of pregnancy
Pre-existing essential hypertension
complicating pregnancy, third trimester
Obesity complicating pregnancy, third
trimester
Advanced maternal age multigravida 35+,
third trimester (low risk Panorama)
Previous cesarean delivery, antepartum
OB History

Blood Type:            Height:  5'6"   Weight (lb):  230       BMI:
Gravidity:    6         Term:   3         SAB:   2
Ectopic:      1        Living:  3
Fetal Evaluation

Num Of Fetuses:     1
Fetal Heart         157
Rate(bpm):
Cardiac Activity:   Observed
Presentation:       Cephalic
Placenta:           Fundal, above cervical os
Amniotic Fluid
AFI FV:      Subjectively within normal limits

AFI Sum(cm)     %Tile       Largest Pocket(cm)
17.76           69

RUQ(cm)       RLQ(cm)       LUQ(cm)        LLQ(cm)
5.55
Gestational Age

LMP:           37w 6d        Date:  03/18/15                 EDD:   12/23/15
Best:          37w 6d     Det. By:  LMP  (03/18/15)          EDD:   12/23/15
Cervix Uterus Adnexa

Left Ovary
Not visualized.

Right Ovary
Not visualized.
Impression

IUP at 37+6 weeks with chronic hypertension and AMA
REactive NSt
AFI 17.8 cm, normal
Recommendations

She is scheduled for an NST 12/11/15 in your office, and will
be undergoing delivery on 12/16/15. No further visits are
scheduled here in [facility]

## 2017-02-03 ENCOUNTER — Emergency Department (HOSPITAL_COMMUNITY)
Admission: EM | Admit: 2017-02-03 | Discharge: 2017-02-03 | Discharge status: Against Medical Advice | Payer: Medicaid Other | Attending: Emergency Medicine | Admitting: Emergency Medicine

## 2017-02-03 ENCOUNTER — Encounter (HOSPITAL_COMMUNITY): Payer: Self-pay | Admitting: Emergency Medicine

## 2017-02-03 DIAGNOSIS — R1013 Epigastric pain: Secondary | ICD-10-CM | POA: Diagnosis not present

## 2017-02-03 DIAGNOSIS — Z5321 Procedure and treatment not carried out due to patient leaving prior to being seen by health care provider: Secondary | ICD-10-CM | POA: Insufficient documentation

## 2017-02-03 NOTE — ED Notes (Signed)
Pt left without being seen.

## 2017-02-03 NOTE — ED Notes (Signed)
Called for pt, no response

## 2017-02-03 NOTE — ED Triage Notes (Signed)
Pt c/o abdominal pain since this morning. Denies N/V, states last BM was yesterday.

## 2017-02-21 ENCOUNTER — Encounter (HOSPITAL_COMMUNITY): Payer: Self-pay | Admitting: Emergency Medicine

## 2017-02-21 ENCOUNTER — Ambulatory Visit (HOSPITAL_COMMUNITY)
Admission: EM | Admit: 2017-02-21 | Discharge: 2017-02-21 | Disposition: A | Discharge status: Home / Self Care | Payer: Medicaid Other | Attending: Family Medicine | Admitting: Family Medicine

## 2017-02-21 DIAGNOSIS — R51 Headache: Secondary | ICD-10-CM

## 2017-02-21 DIAGNOSIS — R42 Dizziness and giddiness: Secondary | ICD-10-CM

## 2017-02-21 DIAGNOSIS — R519 Headache, unspecified: Secondary | ICD-10-CM

## 2017-02-21 MED ORDER — CETIRIZINE HCL 10 MG PO TABS: 10.0000 mg | ORAL_TABLET | Freq: Every day | ORAL | 0 refills | Status: DC

## 2017-02-21 MED ORDER — DEXAMETHASONE SODIUM PHOSPHATE 10 MG/ML IJ SOLN
INTRAMUSCULAR | Status: AC
  Filled 2017-02-21: qty 1

## 2017-02-21 MED ORDER — METOCLOPRAMIDE HCL 5 MG/ML IJ SOLN
INTRAMUSCULAR | Status: AC
  Filled 2017-02-21: qty 2

## 2017-02-21 MED ORDER — METOCLOPRAMIDE HCL 5 MG/ML IJ SOLN
5.0000 mg | Freq: Once | INTRAMUSCULAR | Status: AC
  Administered 2017-02-21: 5 mg via INTRAMUSCULAR

## 2017-02-21 MED ORDER — KETOROLAC TROMETHAMINE 60 MG/2ML IM SOLN
60.0000 mg | Freq: Once | INTRAMUSCULAR | Status: AC
  Administered 2017-02-21: 60 mg via INTRAMUSCULAR

## 2017-02-21 MED ORDER — FLUTICASONE PROPIONATE 50 MCG/ACT NA SUSP: 2.0000 | Freq: Every day | NASAL | 0 refills | Status: DC

## 2017-02-21 MED ORDER — DEXAMETHASONE SODIUM PHOSPHATE 10 MG/ML IJ SOLN
10.0000 mg | Freq: Once | INTRAMUSCULAR | Status: AC
  Administered 2017-02-21: 10 mg via INTRAMUSCULAR

## 2017-02-21 MED ORDER — KETOROLAC TROMETHAMINE 60 MG/2ML IM SOLN
INTRAMUSCULAR | Status: AC
  Filled 2017-02-21: qty 2

## 2017-02-21 NOTE — ED Provider Notes (Signed)
[facility]    CSN: [id number] Arrival date & time: 02/21/17  1732     History   Chief Complaint Chief Complaint  Patient presents with  . Headache    HPI Emily Saunders is a 42 y.o. female.   42 year old female comes in for 4-day history of headache and dizziness.  Patient states headache is frontal, it can be intermittent, throbbing and pressure sensation.  States she feels like pain is behind her eye.  Denies red eyes, vision changes, photophobia, phonophobia.  Denies URI symptoms such as cough, congestion, sore throat.  Denies nausea, vomiting, confusion, weakness, syncope.  No obvious aggravating or alleviating factor.  Dizziness mostly with head movement.  She is tried Tylenol, ibuprofen without relief.  Denies worse headache in her life.      Past Medical History:  Diagnosis Date  . H/O pilonidal cyst 2002   . Hypertension   . PID (acute pelvic inflammatory disease) 2006   . Pneumonia   . Vitamin D deficiency 2015     Patient Active Problem List   Diagnosis Date Noted  . HTN (hypertension) 02/27/2015  . Unintended weight loss 10/27/2014  . Allergic rhinitis 06/23/2014  . Elevated BP 06/23/2014  . Vitamin D insufficiency 03/03/2014  . Pilonidal cyst 03/03/2014  . Broken teeth 03/03/2014  . Poor sleep pattern 03/03/2014  . Obesity (BMI 30-39.9) 03/03/2014  . Current smoker 03/03/2014  . Anemia 05/27/2013    Past Surgical History:  Procedure Laterality Date  . CESAREAN SECTION    . CYST EXCISION Right 2003   hand    OB History    Gravida Para Term Preterm AB Living   6 4 4   2 4    SAB TAB Ectopic Multiple Live Births   1   1 0 4       Home Medications    Prior to Admission medications   Medication Sig Start Date End Date Taking? Authorizing Provider  amLODipine (NORVASC) 10 MG tablet Take 1 tablet (10 mg total) by mouth daily. 05/17/16  Yes Shelly Bombard, MD  cetirizine (ZYRTEC) 10 MG tablet Take 1 tablet (10 mg total) by mouth  daily. 02/21/17   Tasia Catchings, Amy V, PA-C  fluticasone (FLONASE) 50 MCG/ACT nasal spray Place 2 sprays into both nostrils daily. 02/21/17   Tasia Catchings, Amy V, PA-C  hydrochlorothiazide (HYDRODIURIL) 25 MG tablet Take 1 tablet (25 mg total) by mouth daily. 05/17/16   Shelly Bombard, MD  Prenatal Vit-Fe Fumarate-FA (PRENATAL VITAMIN PO) Take 1 tablet by mouth daily.     [provider]  senna-docusate (SENOKOT-S) 8.6-50 MG tablet Take 2 tablets by mouth 2 (two) times daily. Patient not taking: Reported on 01/13/2016 12/18/15   Morene Crocker, CNM    Family History Family History  Problem Relation Age of Onset  . Hypertension Mother   . Diabetes Mother   . Asthma Mother   . COPD Mother   . Depression Mother   . Miscarriages / [place] Mother   . Vision loss Mother   . Heart disease Mother   . Parkinson's disease Father   . Diabetes Maternal Aunt   . Diabetes Maternal Uncle   . Diabetes Cousin     Social History Social History   Tobacco Use  . Smoking status: Current Some Day Smoker    Packs/day: 0.50    Years: 20.00    Pack years: 10.00    Types: Cigarettes  . Smokeless tobacco: Never Used  Substance Use Topics  . Alcohol use: No    Alcohol/week: 0.0 oz  . Drug use: No     Allergies   Patient has no known allergies.   Review of Systems Review of Systems  Reason unable to perform ROS: See HPI as above.     Physical Exam Triage Vital Signs ED Triage Vitals  Enc Vitals Group     BP 02/21/17 1818 (!) 140/95     Pulse Rate 02/21/17 1818 87     Resp 02/21/17 1818 16     Temp 02/21/17 1818 98.2 F (36.8 C)     Temp Source 02/21/17 1818 Oral     SpO2 02/21/17 1818 100 %     Weight 02/21/17 1816 220 lb (99.8 kg)     Height 02/21/17 1816 5\' 6"  (1.676 m)     Head Circumference --      Peak Flow --      Pain Score 02/21/17 1817 9     Pain Loc --      Pain Edu? --      Excl. in [place]? --    No data found.  Updated Vital Signs BP (!) 140/95   Pulse 87   Temp  98.2 F (36.8 C) (Oral)   Resp 16   Ht 5\' 6"  (1.676 m)   Wt 220 lb (99.8 kg)   LMP 02/14/2017   SpO2 100%   BMI 35.51 kg/m   Physical Exam  Constitutional: She is oriented to person, place, and time. She appears well-developed and well-nourished. No distress.  HENT:  Head: Normocephalic and atraumatic.  Right Ear: External ear and ear canal normal. Tympanic membrane is erythematous. Tympanic membrane is not bulging.  Left Ear: External ear and ear canal normal. Tympanic membrane is erythematous. Tympanic membrane is not bulging.  Nose: Right sinus exhibits frontal sinus tenderness. Right sinus exhibits no maxillary sinus tenderness. Left sinus exhibits frontal sinus tenderness. Left sinus exhibits no maxillary sinus tenderness.  Mouth/Throat: Uvula is midline, oropharynx is clear and moist and mucous membranes are normal.  Eyes: Conjunctivae and EOM are normal. Pupils are equal, round, and reactive to light.  Neck: Normal range of motion. Neck supple.  Cardiovascular: Normal rate, regular rhythm and normal heart sounds. Exam reveals no gallop and no friction rub.  No murmur heard. Pulmonary/Chest: Effort normal and breath sounds normal. She has no decreased breath sounds. She has no wheezes. She has no rhonchi. She has no rales.  Lymphadenopathy:    She has no cervical adenopathy.  Neurological: She is alert and oriented to person, place, and time. She has normal strength. She is not disoriented. No cranial nerve deficit or sensory deficit. She displays a negative Romberg sign. Coordination and gait normal. GCS eye subscore is 4. GCS verbal subscore is 5. GCS motor subscore is 6.  Normal rapid movement, finger to nose.  Skin: Skin is warm and dry.  Psychiatric: She has a normal mood and affect. Her behavior is normal. Judgment normal.     UC Treatments / Results  Labs (all labs ordered are listed, but only abnormal results are displayed) Labs Reviewed - No data to display  EKG   EKG Interpretation None       Radiology No results found.  Procedures Procedures (including critical care time)  Medications Ordered in UC Medications  ketorolac (TORADOL) injection 60 mg (60 mg Intramuscular Given 02/21/17 1853)  metoCLOPramide (REGLAN) injection 5 mg (5 mg Intramuscular Given 02/21/17 1857)  dexamethasone (  DECADRON) injection 10 mg (10 mg Intramuscular Given 02/21/17 1854)     Initial Impression / Assessment and Plan / UC Course  I have reviewed the triage vital signs and the nursing notes.  Pertinent labs & imaging results that were available during my care of the patient were reviewed by me and considered in my medical decision making (see chart for details).    No alarming signs on exam.  Discussed possible eustachian tube dysfunction/sinus pressure causing symptoms.  Start Flonase, Zyrtec for symptoms.  Toradol, Reglan, Decadron injection in office.  Return precautions given.  Patient expresses understanding and agrees to plan.  Final Clinical Impressions(s) / UC Diagnoses   Final diagnoses:  Acute intractable headache, unspecified headache type    ED Discharge Orders        Ordered    fluticasone (FLONASE) 50 MCG/ACT nasal spray  Daily     02/21/17 1837    cetirizine (ZYRTEC) 10 MG tablet  Daily     02/21/17 1837        Ok Edwards, PA-C 02/21/17 1945

## 2017-02-21 NOTE — ED Triage Notes (Signed)
PT reports headaches and dizziness for 4 days. PT has tried multiple OTC meds.

## 2017-02-21 NOTE — Discharge Instructions (Signed)
No alarming signs on exam.  Toradol, steroid injection in office today.  As we discussed, headache and dizziness could be due to sinus pressure.  Start Flonase and Zyrtec as directed.  Monitor for any worsening of symptoms, nausea, vomiting, sensitivity to light, one-sided weakness, passing out, worse headache in her life, go to the emergency department for further evaluation.

## 2017-03-03 ENCOUNTER — Ambulatory Visit (INDEPENDENT_AMBULATORY_CARE_PROVIDER_SITE_OTHER): Payer: Medicaid Other | Admitting: Obstetrics

## 2017-03-03 ENCOUNTER — Other Ambulatory Visit (HOSPITAL_COMMUNITY)
Admission: RE | Admit: 2017-03-03 | Discharge: 2017-03-03 | Disposition: A | Discharge status: Home / Self Care | Payer: Medicaid Other | Source: Ambulatory Visit | Attending: Obstetrics | Admitting: Obstetrics

## 2017-03-03 ENCOUNTER — Encounter: Payer: Self-pay | Admitting: Obstetrics

## 2017-03-03 VITALS — BP 133/91 | HR 88 | Ht 66.0 in | Wt 225.5 lb

## 2017-03-03 DIAGNOSIS — Z0000 Encounter for general adult medical examination without abnormal findings: Secondary | ICD-10-CM

## 2017-03-03 DIAGNOSIS — G43119 Migraine with aura, intractable, without status migrainosus: Secondary | ICD-10-CM | POA: Diagnosis not present

## 2017-03-03 DIAGNOSIS — N898 Other specified noninflammatory disorders of vagina: Secondary | ICD-10-CM

## 2017-03-03 DIAGNOSIS — I10 Essential (primary) hypertension: Secondary | ICD-10-CM

## 2017-03-03 DIAGNOSIS — Z975 Presence of (intrauterine) contraceptive device: Secondary | ICD-10-CM

## 2017-03-03 MED ORDER — BUTALBITAL-APAP-CAFF-COD 50-325-40-30 MG PO CAPS: 2.0000 | ORAL_CAPSULE | Freq: Four times a day (QID) | ORAL | 2 refills | Status: DC | PRN

## 2017-03-03 MED ORDER — SECNIDAZOLE 2 G PO PACK
1.0000 | PACK | Freq: Once | ORAL | 2 refills | Status: DC
Start: 2017-03-03 — End: 2017-03-03

## 2017-03-03 MED ORDER — METRONIDAZOLE 500 MG PO TABS: 500.0000 mg | ORAL_TABLET | Freq: Two times a day (BID) | ORAL | 2 refills | Status: DC

## 2017-03-03 MED ORDER — VITAFOL ULTRA 29-0.6-0.4-200 MG PO CAPS: 1.0000 | ORAL_CAPSULE | Freq: Every day | ORAL | 11 refills | Status: DC

## 2017-03-03 MED ORDER — AMLODIPINE BESYLATE 10 MG PO TABS: 10.0000 mg | ORAL_TABLET | Freq: Every day | ORAL | 11 refills | Status: DC

## 2017-03-03 MED ORDER — HYDROCHLOROTHIAZIDE 25 MG PO TABS: 25.0000 mg | ORAL_TABLET | Freq: Every day | ORAL | 11 refills | Status: DC

## 2017-03-03 NOTE — Progress Notes (Signed)
Patient ID: Emily Saunders, female   DOB: 10-Mar-1975, 42 y.o.   MRN: [id number]  Chief Complaint  Patient presents with  . Contraception    HPI Emily Saunders is a 42 y.o. female.  Malodorous vaginal discharge.  Denies vaginal itching or irritation.  Had headache for 4 days.  Went to Urgent Care. HPI  Past Medical History:  Diagnosis Date  . H/O pilonidal cyst 2002   . Hypertension   . PID (acute pelvic inflammatory disease) 2006   . Pneumonia   . Vitamin D deficiency 2015     Past Surgical History:  Procedure Laterality Date  . CESAREAN SECTION    . CYST EXCISION Right 2003   hand    Family History  Problem Relation Age of Onset  . Hypertension Mother   . Diabetes Mother   . Asthma Mother   . COPD Mother   . Depression Mother   . Miscarriages / [place] Mother   . Vision loss Mother   . Heart disease Mother   . Parkinson's disease Father   . Diabetes Maternal Aunt   . Diabetes Maternal Uncle   . Diabetes Cousin     Social History Social History   Tobacco Use  . Smoking status: Current Some Day Smoker    Packs/day: 0.50    Years: 20.00    Pack years: 10.00    Types: Cigarettes  . Smokeless tobacco: Never Used  Substance Use Topics  . Alcohol use: No    Alcohol/week: 0.0 oz  . Drug use: No    No Known Allergies  Current Outpatient Medications  Medication Sig Dispense Refill  . amLODipine (NORVASC) 10 MG tablet Take 1 tablet (10 mg total) by mouth daily. 30 tablet 11  . butalbital-acetaminophen-caffeine (FIORICET/CODEINE) 50-325-40-30 MG capsule Take 2 capsules by mouth every 6 (six) hours as needed for headache. 30 capsule 2  . cetirizine (ZYRTEC) 10 MG tablet Take 1 tablet (10 mg total) by mouth daily. (Patient not taking: Reported on 03/03/2017) 15 tablet 0  . fluticasone (FLONASE) 50 MCG/ACT nasal spray Place 2 sprays into both nostrils daily. (Patient not taking: Reported on 03/03/2017) 1 g 0  . hydrochlorothiazide (HYDRODIURIL) 25 MG tablet Take  1 tablet (25 mg total) by mouth daily. 30 tablet 11  . Prenat-Fe Poly-Methfol-FA-DHA (VITAFOL ULTRA) 29-0.6-0.4-200 MG CAPS Take 1 capsule by mouth daily before breakfast. 30 capsule 11  . Prenatal Vit-Fe Fumarate-FA (PRENATAL VITAMIN PO) Take 1 tablet by mouth daily.     . Secnidazole (SOLOSEC) 2 g PACK Take 1 packet by mouth once for 1 dose. Mix as directed with yogurt, pudding, applesauce, etc. 1 each 2  . senna-docusate (SENOKOT-S) 8.6-50 MG tablet Take 2 tablets by mouth 2 (two) times daily. (Patient not taking: Reported on 01/13/2016) 90 tablet 2   No current facility-administered medications for this visit.     Review of Systems Review of Systems Constitutional: negative for fatigue and weight loss Respiratory: negative for cough and wheezing Cardiovascular: negative for chest pain, fatigue and palpitations Gastrointestinal: negative for abdominal pain and change in bowel habits Genitourinary:positive for malodorous vaginal discharge Integument/breast: negative for nipple discharge Musculoskeletal:negative for myalgias Neurological: POSITIVE FOR MIGRAINE-LIKE HEADACHES Behavioral/Psych: negative for abusive relationship, depression Endocrine: negative for temperature intolerance      Blood pressure (!) 133/91, pulse 88, height 5\' 6"  (1.676 m), weight 225 lb 8 oz (102.3 kg), last menstrual period 02/14/2017, not currently breastfeeding.  Physical Exam Physical Exam  General: Alert and no distress          HEENT: negative Abdomen:  normal findings: no organomegaly, soft, non-tender and no hernia  Pelvis:  External genitalia: normal general appearance Urinary system: urethral meatus normal and bladder without fullness, nontender Vaginal: normal without tenderness, induration or masses Cervix: normal appearance Adnexa: normal bimanual exam Uterus: anteverted and non-tender, normal size    50% of 15 min visit spent on counseling and coordination of care.   Data  Reviewed Wet Prep  Assessment     1. Vaginal discharge Rx: - Cervicovaginal ancillary only - Secnidazole (SOLOSEC) 2 g PACK; Take 1 packet by mouth once for 1 dose. Mix as directed with yogurt, pudding, applesauce, etc.  Dispense: 1 each; Refill: 2  2. Intractable migraine with aura without status migrainosus Rx: - butalbital-acetaminophen-caffeine (FIORICET/CODEINE) 50-325-40-30 MG capsule; Take 2 capsules by mouth every 6 (six) hours as needed for headache.  Dispense: 30 capsule; Refill: 2  3. Routine adult health maintenance Rx: - Prenat-Fe Poly-Methfol-FA-DHA (VITAFOL ULTRA) 29-0.6-0.4-200 MG CAPS; Take 1 capsule by mouth daily before breakfast.  Dispense: 30 capsule; Refill: 11 - referred to Internal Medicine  4. HTN (hypertension), benign Rx: - hydrochlorothiazide (HYDRODIURIL) 25 MG tablet; Take 1 tablet (25 mg total) by mouth daily.  Dispense: 30 tablet; Refill: 11 - amLODipine (NORVASC) 10 MG tablet; Take 1 tablet (10 mg total) by mouth daily.  Dispense: 30 tablet; Refill: 11  5. IUD (intrauterine device) in place     Plan    Follow up prn   No orders of the defined types were placed in this encounter.  Meds ordered this encounter  Medications  . Secnidazole (SOLOSEC) 2 g PACK    Sig: Take 1 packet by mouth once for 1 dose. Mix as directed with yogurt, pudding, applesauce, etc.    Dispense:  1 each    Refill:  2  . butalbital-acetaminophen-caffeine (FIORICET/CODEINE) 50-325-40-30 MG capsule    Sig: Take 2 capsules by mouth every 6 (six) hours as needed for headache.    Dispense:  30 capsule    Refill:  2  . Prenat-Fe Poly-Methfol-FA-DHA (VITAFOL ULTRA) 29-0.6-0.4-200 MG CAPS    Sig: Take 1 capsule by mouth daily before breakfast.    Dispense:  30 capsule    Refill:  11  . hydrochlorothiazide (HYDRODIURIL) 25 MG tablet    Sig: Take 1 tablet (25 mg total) by mouth daily.    Dispense:  30 tablet    Refill:  11  . amLODipine (NORVASC) 10 MG tablet    Sig:  Take 1 tablet (10 mg total) by mouth daily.    Dispense:  30 tablet    Refill:  11    Shelly Bombard MD

## 2017-03-03 NOTE — Progress Notes (Signed)
Presents for IUD check; feels something poking her x 2 days.  Having malodorous discharge x 2 weeks and headaches 10/10 4 days.

## 2017-03-03 NOTE — Addendum Note (Signed)
Addended by: Baltazar Najjar A on: 03/03/2017 11:34 AM   Modules accepted: Orders

## 2017-03-06 ENCOUNTER — Other Ambulatory Visit: Payer: Self-pay | Admitting: Obstetrics

## 2017-03-06 ENCOUNTER — Telehealth: Payer: Self-pay | Admitting: Pediatrics

## 2017-03-06 DIAGNOSIS — R519 Headache, unspecified: Secondary | ICD-10-CM

## 2017-03-06 DIAGNOSIS — R51 Headache: Principal | ICD-10-CM

## 2017-03-06 LAB — CERVICOVAGINAL ANCILLARY ONLY
Bacterial vaginitis: POSITIVE — AB
Candida vaginitis: NEGATIVE
Chlamydia: NEGATIVE
Neisseria Gonorrhea: NEGATIVE
Trichomonas: NEGATIVE

## 2017-03-06 MED ORDER — BUTALBITAL-APAP-CAFFEINE 50-325-40 MG PO TABS: 2.0000 | ORAL_TABLET | Freq: Four times a day (QID) | ORAL | 0 refills | Status: DC | PRN

## 2017-03-06 NOTE — Telephone Encounter (Signed)
[facility] is not paying for Fioricet w/ codeine.  Would you like to change the rx?

## 2017-03-06 NOTE — Telephone Encounter (Signed)
Fioricet without codeine Rx

## 2017-03-17 ENCOUNTER — Telehealth: Payer: Self-pay

## 2017-03-17 NOTE — Telephone Encounter (Signed)
She needs to go to [facility]

## 2017-03-17 NOTE — Telephone Encounter (Signed)
TC from patient regarding HA's Pt states Rx we sent Fioricet is not helping with HA's. Pt denies any dizziness or visual changes. Please advise.

## 2017-03-26 ENCOUNTER — Encounter (HOSPITAL_COMMUNITY): Payer: Self-pay

## 2017-03-26 ENCOUNTER — Emergency Department (HOSPITAL_COMMUNITY)
Admission: EM | Admit: 2017-03-26 | Discharge: 2017-03-26 | Disposition: A | Discharge status: Home / Self Care | Payer: Medicaid Other | Attending: Emergency Medicine | Admitting: Emergency Medicine

## 2017-03-26 ENCOUNTER — Other Ambulatory Visit: Payer: Self-pay

## 2017-03-26 DIAGNOSIS — R6884 Jaw pain: Secondary | ICD-10-CM | POA: Diagnosis not present

## 2017-03-26 DIAGNOSIS — R51 Headache: Secondary | ICD-10-CM

## 2017-03-26 DIAGNOSIS — F17210 Nicotine dependence, cigarettes, uncomplicated: Secondary | ICD-10-CM | POA: Diagnosis not present

## 2017-03-26 DIAGNOSIS — I10 Essential (primary) hypertension: Secondary | ICD-10-CM | POA: Insufficient documentation

## 2017-03-26 DIAGNOSIS — Z79899 Other long term (current) drug therapy: Secondary | ICD-10-CM | POA: Insufficient documentation

## 2017-03-26 DIAGNOSIS — R519 Headache, unspecified: Secondary | ICD-10-CM

## 2017-03-26 MED ORDER — HYDROCODONE-ACETAMINOPHEN 5-325 MG PO TABS: 1.0000 | ORAL_TABLET | Freq: Four times a day (QID) | ORAL | 0 refills | Status: DC | PRN

## 2017-03-26 MED ORDER — NAPROXEN 500 MG PO TABS: 500.0000 mg | ORAL_TABLET | Freq: Two times a day (BID) | ORAL | 0 refills | Status: DC

## 2017-03-26 MED ORDER — METHOCARBAMOL 500 MG PO TABS: 500.0000 mg | ORAL_TABLET | Freq: Two times a day (BID) | ORAL | 0 refills | Status: DC

## 2017-03-26 MED ORDER — PREDNISONE 10 MG (21) PO TBPK: ORAL_TABLET | ORAL | 0 refills | Status: DC

## 2017-03-26 NOTE — ED Notes (Signed)
PA Shawn at chairside

## 2017-03-26 NOTE — ED Triage Notes (Signed)
She reports left TMJ area discomfort and "clicking and trouble opening my mouth" x 2 weeks. She denies injury/fever, nor any other current issues.

## 2017-03-26 NOTE — ED Provider Notes (Signed)
[facility] DEPT Provider Note   CSN: [id number] Arrival date & time: 03/26/17  1219     History   Chief Complaint Chief Complaint  Patient presents with  . Jaw Pain    HPI Emily Saunders is a 42 y.o. female.  HPI   Emily Saunders is a 42 y.o. female, with a history of TMJ syndrome and HTN, presenting to the ED with left-sided facial pain for the last 2 weeks. Pain is constant, waxes and wanes, aching, and is reported to include from the left posterior mandible to the zygomatic region.  Worse with mouth opening and chewing.  States it feels similar to her TMJ pain she had years ago.  She has been taking ibuprofen without improvement. Patient states she had all of her teeth removed a number of years ago because she "did not want to deal with them."  She has upper and lower dentures in place. Went to dentist last week to have dentures adjusted, but pain did not improve.   Denies fever/chills, nausea/vomiting, trauma, drooling, difficulty breathing or swallowing, facial swelling, ear pain, headache, facial spasms, congestion, rhinorrhea, eye pain or discharge, sinus pressure, or any other complaints.  Past Medical History:  Diagnosis Date  . H/O pilonidal cyst 2002   . Hypertension   . PID (acute pelvic inflammatory disease) 2006   . Pneumonia   . Vitamin D deficiency 2015     Patient Active Problem List   Diagnosis Date Noted  . HTN (hypertension) 02/27/2015  . Unintended weight loss 10/27/2014  . Allergic rhinitis 06/23/2014  . Elevated BP 06/23/2014  . Vitamin D insufficiency 03/03/2014  . Pilonidal cyst 03/03/2014  . Broken teeth 03/03/2014  . Poor sleep pattern 03/03/2014  . Obesity (BMI 30-39.9) 03/03/2014  . Current smoker 03/03/2014  . Anemia 05/27/2013    Past Surgical History:  Procedure Laterality Date  . CESAREAN SECTION    . CYST EXCISION Right 2003   hand    OB History    Gravida Para Term Preterm AB Living   6 4  4   2 4    SAB TAB Ectopic Multiple Live Births   1   1 0 4       Home Medications    Prior to Admission medications   Medication Sig Start Date End Date Taking? Authorizing Provider  amLODipine (NORVASC) 10 MG tablet Take 1 tablet (10 mg total) by mouth daily. 03/03/17   Shelly Bombard, MD  butalbital-acetaminophen-caffeine (FIORICET, ESGIC) [phone] MG tablet Take 2 tablets by mouth every 6 (six) hours as needed for headache. 03/06/17 03/06/18  Shelly Bombard, MD  cetirizine (ZYRTEC) 10 MG tablet Take 1 tablet (10 mg total) by mouth daily. Patient not taking: Reported on 03/03/2017 02/21/17   Ok Edwards, PA-C  fluticasone [facility]) 50 MCG/ACT nasal spray Place 2 sprays into both nostrils daily. Patient not taking: Reported on 03/03/2017 02/21/17   Ok Edwards, PA-C  hydrochlorothiazide (HYDRODIURIL) 25 MG tablet Take 1 tablet (25 mg total) by mouth daily. 03/03/17   Shelly Bombard, MD  HYDROcodone-acetaminophen (NORCO/VICODIN) 5-325 MG tablet Take 1-2 tablets by mouth every 6 (six) hours as needed for severe pain. 03/26/17   Joy, Shawn C, PA-C  methocarbamol (ROBAXIN) 500 MG tablet Take 1 tablet (500 mg total) by mouth 2 (two) times daily. 03/26/17   Joy, Shawn C, PA-C  metroNIDAZOLE (FLAGYL) 500 MG tablet Take 1 tablet (500 mg total) by mouth 2 (two)  times daily. 03/03/17   Shelly Bombard, MD  naproxen (NAPROSYN) 500 MG tablet Take 1 tablet (500 mg total) by mouth 2 (two) times daily. 03/26/17   Joy, Shawn C, PA-C  predniSONE (STERAPRED UNI-PAK 21 TAB) 10 MG (21) TBPK tablet Take 6 tabs by mouth daily  for 2 days, then 5 tabs for 2 days, then 4 tabs for 2 days, then 3 tabs for 2 days, 2 tabs for 2 days, then 1 tab by mouth daily for 2 days 03/26/17   Joy, Shawn C, PA-C  Prenat-Fe Poly-Methfol-FA-DHA (VITAFOL ULTRA) 29-0.6-0.4-200 MG CAPS Take 1 capsule by mouth daily before breakfast. 03/03/17   Shelly Bombard, MD  Prenatal Vit-Fe Fumarate-FA (PRENATAL VITAMIN PO) Take 1 tablet by mouth  daily.     [provider]  senna-docusate (SENOKOT-S) 8.6-50 MG tablet Take 2 tablets by mouth 2 (two) times daily. Patient not taking: Reported on 01/13/2016 12/18/15   Morene Crocker, CNM    Family History Family History  Problem Relation Age of Onset  . Hypertension Mother   . Diabetes Mother   . Asthma Mother   . COPD Mother   . Depression Mother   . Miscarriages / [place] Mother   . Vision loss Mother   . Heart disease Mother   . Parkinson's disease Father   . Diabetes Maternal Aunt   . Diabetes Maternal Uncle   . Diabetes Cousin     Social History Social History   Tobacco Use  . Smoking status: Current Some Day Smoker    Packs/day: 0.50    Years: 20.00    Pack years: 10.00    Types: Cigarettes  . Smokeless tobacco: Never Used  Substance Use Topics  . Alcohol use: No    Alcohol/week: 0.0 oz  . Drug use: No     Allergies   Patient has no known allergies.   Review of Systems Review of Systems  Constitutional: Negative for chills, diaphoresis and fever.  HENT: Negative for drooling, ear discharge, ear pain, facial swelling, rhinorrhea, sinus pressure, sinus pain, sore throat, trouble swallowing and voice change.        Left-sided facial pain  Eyes: Negative for pain and visual disturbance.  Respiratory: Negative for shortness of breath.   Cardiovascular: Negative for chest pain.  Gastrointestinal: Negative for nausea and vomiting.  Musculoskeletal: Negative for neck pain.  Neurological: Negative for headaches.  All other systems reviewed and are negative.    Physical Exam Updated Vital Signs BP (!) 149/96 (BP Location: Right Arm)   Pulse 75   Temp 98.1 F (36.7 C) (Oral)   Resp 16   Ht 5\' 6"  (1.676 m)   Wt 102.1 kg (225 lb)   LMP 03/12/2017 (Exact Date)   SpO2 93%   BMI 36.32 kg/m   Physical Exam  Constitutional: She appears well-developed and well-nourished. No distress.  HENT:  Head: Normocephalic and atraumatic.     Mouth/Throat: Oropharynx is clear and moist.  Some facial tenderness in the area indicated.  Patient has decreased jaw opening due to pain.  Handles oral secretions without difficulty.  No trismus.  No noted facial swelling, erythema, or other similar abnormality. Patient has no dentition present.  No noted wounds, swelling, or fluctuance to the gingiva.  Eyes: Conjunctivae and EOM are normal. Pupils are equal, round, and reactive to light.  Neck: Normal range of motion. Neck supple.  Cardiovascular: Normal rate, regular rhythm, normal heart sounds and intact distal pulses.  Pulmonary/Chest:  Effort normal and breath sounds normal. No respiratory distress.  Abdominal: Soft. There is no tenderness. There is no guarding.  Musculoskeletal: She exhibits no edema.  Lymphadenopathy:    She has no cervical adenopathy.  Neurological: She is alert.  No sensory deficits. Strength 5/5 in all extremities. No gait disturbance. Coordination intact including heel to shin and finger to nose. Cranial nerves III-XII grossly intact. No facial droop.   Skin: Skin is warm and dry. She is not diaphoretic.  Psychiatric: She has a normal mood and affect. Her behavior is normal.  Nursing note and vitals reviewed.    ED Treatments / Results  Labs (all labs ordered are listed, but only abnormal results are displayed) Labs Reviewed - No data to display  EKG  EKG Interpretation None       Radiology No results found.  Procedures Procedures (including critical care time)  Medications Ordered in ED Medications - No data to display   Initial Impression / Assessment and Plan / ED Course  I have reviewed the triage vital signs and the nursing notes.  Pertinent labs & imaging results that were available during my care of the patient were reviewed by me and considered in my medical decision making (see chart for details).     Patient presents with left-sided facial pain.  TMJ syndrome is higher on  the differential than an infectious cause or pathology such as trigeminal neuralgia.  PCP versus ENT follow-up.  Resources given. The patient was given instructions for home care as well as return precautions. Patient voices understanding of these instructions, accepts the plan, and is comfortable with discharge.      Final Clinical Impressions(s) / ED Diagnoses   Final diagnoses:  Left facial pain    ED Discharge Orders        Ordered    naproxen (NAPROSYN) 500 MG tablet  2 times daily     03/26/17 1732    methocarbamol (ROBAXIN) 500 MG tablet  2 times daily     03/26/17 1732    predniSONE (STERAPRED UNI-PAK 21 TAB) 10 MG (21) TBPK tablet     03/26/17 1732    HYDROcodone-acetaminophen (NORCO/VICODIN) 5-325 MG tablet  Every 6 hours PRN     03/26/17 1735       Lorayne Bender, PA-C 03/26/17 1746    Blanchie Dessert, MD 03/26/17 2358

## 2017-03-26 NOTE — Discharge Instructions (Signed)
For treatment of TMJ type pain, the treatment regimen is as follows: Naproxen: Take this medication twice a day consistently for 10 days.  Take this medication with food to avoid upset stomach.  A typical reason for failure of NSAIDs, like ibuprofen or naproxen, to improve the pain is in consistency.   Tylenol: Should you continue to have additional pain while taking the ibuprofen or naproxen, you may add in tylenol as needed. Your daily total maximum amount of tylenol from all sources should be limited to 4000mg /day for persons without liver problems, or 2000mg /day for those with liver problems. Vicodin: May take Vicodin as needed for severe pain.  Do not drive or perform other dangerous activities while taking the Vicodin.  Please note that each pill of Vicodin contains 325 mg of Tylenol and the above dosage limits apply. Prednisone: Take the prednisone, as prescribed, until finished.  There are a few other treatment options for TMJ type pain, however, the above regimen must be tried first.  Further evaluation and management should be performed by a primary care provider or ear nose and throat specialist.  This type of complaint cannot be efficiently managed in the emergency department.

## 2017-05-29 ENCOUNTER — Encounter (HOSPITAL_COMMUNITY): Payer: Self-pay | Admitting: Emergency Medicine

## 2017-05-29 ENCOUNTER — Emergency Department (HOSPITAL_COMMUNITY): Payer: Medicaid Other

## 2017-05-29 ENCOUNTER — Emergency Department (HOSPITAL_COMMUNITY)
Admission: EM | Admit: 2017-05-29 | Discharge: 2017-05-29 | Disposition: A | Discharge status: Home / Self Care | Payer: Medicaid Other | Attending: Emergency Medicine | Admitting: Emergency Medicine

## 2017-05-29 DIAGNOSIS — F17210 Nicotine dependence, cigarettes, uncomplicated: Secondary | ICD-10-CM | POA: Diagnosis not present

## 2017-05-29 DIAGNOSIS — I10 Essential (primary) hypertension: Secondary | ICD-10-CM | POA: Diagnosis not present

## 2017-05-29 DIAGNOSIS — R05 Cough: Secondary | ICD-10-CM | POA: Diagnosis present

## 2017-05-29 DIAGNOSIS — J069 Acute upper respiratory infection, unspecified: Secondary | ICD-10-CM

## 2017-05-29 DIAGNOSIS — Z79899 Other long term (current) drug therapy: Secondary | ICD-10-CM | POA: Insufficient documentation

## 2017-05-29 DIAGNOSIS — B9789 Other viral agents as the cause of diseases classified elsewhere: Secondary | ICD-10-CM

## 2017-05-29 MED ORDER — FLUTICASONE PROPIONATE 50 MCG/ACT NA SUSP: 2.0000 | Freq: Every day | NASAL | 0 refills | Status: DC

## 2017-05-29 MED ORDER — BENZONATATE 100 MG PO CAPS: 100.0000 mg | ORAL_CAPSULE | Freq: Three times a day (TID) | ORAL | 0 refills | Status: DC | PRN

## 2017-05-29 NOTE — ED Triage Notes (Signed)
Patient c/o nonproductive cough and congestion x1 week. Denies N/V/D.

## 2017-05-29 NOTE — Discharge Instructions (Addendum)
Your chest x-ray did not show any signs of pneumonia or fluid in the lungs.  Your symptoms are consistent with a viral upper respiratory infection.  Drink lots of water and get lots of rest.  Gargle warm salt water and spit it out for sore throat. May also use cough drops, warm teas, etc. Take flonase to decrease nasal congestion.  Over-the-counter allergy medicines and cold medicines for nasal congestion and scratchy throat.  Take Tessalon for cough up to 3 times daily.  Alternate 600 mg of ibuprofen and [phone] mg of Tylenol every 3 hours as needed for pain. Do not exceed 4000 mg of Tylenol daily.   Followup with your primary care doctor in 5-7 days for recheck of ongoing symptoms. Return to emergency department for emergent changing or worsening of symptoms such as throat tightness, facial swelling, fever not controlled by ibuprofen or Tylenol,difficulty breathing, or chest pain.

## 2017-05-29 NOTE — ED Provider Notes (Signed)
[facility] DEPT Provider Note   CSN: [id number] Arrival date & time: 05/29/17  1057     History   Chief Complaint Chief Complaint  Patient presents with  . Cough    HPI Emily Saunders is a 42 y.o. female with history of hypertension, vitamin D deficiency, obesity presents for evaluation of 1 week of nonproductive cough and nasal congestion.  She states that she feels a sensation of chest congestion but "nothing comes up ".  Shortness of breath occurs when she is laying flat on her back.  She denies chest pain.  No fevers or chills, no nausea, vomiting, or diarrhea.  She did notice a mild sore throat yesterday but denies facial swelling, throat tightness, or drooling.  She states that she feels a sensation of facial pressure which worsens when she coughs.  She has been taking Claritin, Alka-Seltzer cold medicine, and using a humidifier without significant relief of her symptoms.  She is a current smoker of approximately half a pack of cigarettes daily.  The history is provided by the patient.    Past Medical History:  Diagnosis Date  . H/O pilonidal cyst 2002   . Hypertension   . PID (acute pelvic inflammatory disease) 2006   . Pneumonia   . Vitamin D deficiency 2015     Patient Active Problem List   Diagnosis Date Noted  . HTN (hypertension) 02/27/2015  . Unintended weight loss 10/27/2014  . Allergic rhinitis 06/23/2014  . Elevated BP 06/23/2014  . Vitamin D insufficiency 03/03/2014  . Pilonidal cyst 03/03/2014  . Broken teeth 03/03/2014  . Poor sleep pattern 03/03/2014  . Obesity (BMI 30-39.9) 03/03/2014  . Current smoker 03/03/2014  . Anemia 05/27/2013    Past Surgical History:  Procedure Laterality Date  . CESAREAN SECTION    . CYST EXCISION Right 2003   hand     OB History    Gravida  6   Para  4   Term  4   Preterm      AB  2   Living  4     SAB  1   TAB      Ectopic  1   Multiple  0   Live Births  4           Home Medications    Prior to Admission medications   Medication Sig Start Date End Date Taking? Authorizing Provider  amLODipine (NORVASC) 10 MG tablet Take 1 tablet (10 mg total) by mouth daily. 03/03/17   Shelly Bombard, MD  benzonatate (TESSALON) 100 MG capsule Take 1 capsule (100 mg total) by mouth 3 (three) times daily as needed for cough. 05/29/17   Rodell Perna A, PA-C  butalbital-acetaminophen-caffeine (FIORICET, ESGIC) [phone] MG tablet Take 2 tablets by mouth every 6 (six) hours as needed for headache. 03/06/17 03/06/18  Shelly Bombard, MD  cetirizine (ZYRTEC) 10 MG tablet Take 1 tablet (10 mg total) by mouth daily. Patient not taking: Reported on 03/03/2017 02/21/17   Ok Edwards, PA-C  fluticasone [facility]) 50 MCG/ACT nasal spray Place 2 sprays into both nostrils daily. 05/29/17   Nils Flack, Nakeshia Waldeck A, PA-C  hydrochlorothiazide (HYDRODIURIL) 25 MG tablet Take 1 tablet (25 mg total) by mouth daily. 03/03/17   Shelly Bombard, MD  HYDROcodone-acetaminophen (NORCO/VICODIN) 5-325 MG tablet Take 1-2 tablets by mouth every 6 (six) hours as needed for severe pain. 03/26/17   Joy, Shawn C, PA-C  methocarbamol (ROBAXIN) 500 MG tablet  Take 1 tablet (500 mg total) by mouth 2 (two) times daily. 03/26/17   Joy, Shawn C, PA-C  metroNIDAZOLE (FLAGYL) 500 MG tablet Take 1 tablet (500 mg total) by mouth 2 (two) times daily. 03/03/17   Shelly Bombard, MD  naproxen (NAPROSYN) 500 MG tablet Take 1 tablet (500 mg total) by mouth 2 (two) times daily. 03/26/17   Joy, Shawn C, PA-C  predniSONE (STERAPRED UNI-PAK 21 TAB) 10 MG (21) TBPK tablet Take 6 tabs by mouth daily  for 2 days, then 5 tabs for 2 days, then 4 tabs for 2 days, then 3 tabs for 2 days, 2 tabs for 2 days, then 1 tab by mouth daily for 2 days 03/26/17   Joy, Shawn C, PA-C  Prenat-Fe Poly-Methfol-FA-DHA (VITAFOL ULTRA) 29-0.6-0.4-200 MG CAPS Take 1 capsule by mouth daily before breakfast. 03/03/17   Shelly Bombard, MD  Prenatal Vit-Fe  Fumarate-FA (PRENATAL VITAMIN PO) Take 1 tablet by mouth daily.     [provider]  senna-docusate (SENOKOT-S) 8.6-50 MG tablet Take 2 tablets by mouth 2 (two) times daily. Patient not taking: Reported on 01/13/2016 12/18/15   Morene Crocker, CNM    Family History Family History  Problem Relation Age of Onset  . Hypertension Mother   . Diabetes Mother   . Asthma Mother   . COPD Mother   . Depression Mother   . Miscarriages / [place] Mother   . Vision loss Mother   . Heart disease Mother   . Parkinson's disease Father   . Diabetes Maternal Aunt   . Diabetes Maternal Uncle   . Diabetes Cousin     Social History Social History   Tobacco Use  . Smoking status: Current Some Day Smoker    Packs/day: 0.50    Years: 20.00    Pack years: 10.00    Types: Cigarettes  . Smokeless tobacco: Never Used  Substance Use Topics  . Alcohol use: No    Alcohol/week: 0.0 oz  . Drug use: No     Allergies   Patient has no known allergies.   Review of Systems Review of Systems  Constitutional: Negative for chills and fever.  HENT: Positive for congestion, sinus pressure and sore throat.   Respiratory: Positive for cough and shortness of breath (laying flat only).   Cardiovascular: Negative for chest pain.  Gastrointestinal: Negative for abdominal pain, diarrhea, nausea and vomiting.     Physical Exam Updated Vital Signs BP 124/89 (BP Location: Left Arm)   Pulse 84   Temp 98.5 F (36.9 C) (Oral)   Resp 12   Ht 5\' 6"  (1.676 m)   Wt 99.3 kg (219 lb)   LMP 05/25/2017 (Approximate)   SpO2 99%   BMI 35.35 kg/m   Physical Exam  Constitutional: She appears well-developed and well-nourished. No distress.  HENT:  Head: Normocephalic and atraumatic.  Nasal septum midline.  Bilateral maxillary sinus tenderness noted.  Mucosal edema bilaterally, left worse than right.  TMs without erythema or bulging bilaterally.  Posterior oropharynx with postnasal drip but no  tonsillar hypertrophy, exudates, uvular deviation, or erythema.  No trismus or sublingual abnormalities.  Speaking in full sentences without difficulty.  Eyes: Conjunctivae are normal. Right eye exhibits no discharge. Left eye exhibits no discharge.  Neck: Normal range of motion. Neck supple. No JVD present. No tracheal deviation present.  Cardiovascular: Normal rate, regular rhythm, normal heart sounds and intact distal pulses.  Pulmonary/Chest: Effort normal. She exhibits no tenderness.  Scattered  expiratory rhonchi.  Equal rise and fall of chest, no increased work of breathing.  Speaking in full sentences without difficulty.  Actively coughing dry rhonchorous cough during my assessment.  Abdominal: Soft. Bowel sounds are normal. She exhibits no distension. There is no tenderness.  Musculoskeletal: She exhibits no edema.  Neurological: She is alert.  Skin: Skin is warm and dry. No erythema.  Psychiatric: She has a normal mood and affect. Her behavior is normal.  Nursing note and vitals reviewed.    ED Treatments / Results  Labs (all labs ordered are listed, but only abnormal results are displayed) Labs Reviewed - No data to display  EKG None  Radiology Dg Chest 2 View  Result Date: 05/29/2017 CLINICAL DATA:  Cough. EXAM: CHEST - 2 VIEW COMPARISON:  05/07/2016 FINDINGS: The heart size and mediastinal contours are within normal limits. There is no evidence of pulmonary edema, consolidation, pneumothorax, nodule or pleural fluid. The visualized skeletal structures are unremarkable. IMPRESSION: No active cardiopulmonary disease. Electronically Signed   By: Aletta Edouard M.D.   On: 05/29/2017 13:44    Procedures Procedures (including critical care time)  Medications Ordered in ED Medications - No data to display   Initial Impression / Assessment and Plan / ED Course  I have reviewed the triage vital signs and the nursing notes.  Pertinent labs & imaging results that were  available during my care of the patient were reviewed by me and considered in my medical decision making (see chart for details).     Pt CXR negative for acute infiltrate.  Patient is afebrile, vital signs are stable.  She is nontoxic in appearance.  Presentation is not consistent for meningitis, strep, PTA, or PE.  Patients symptoms are consistent with URI, likely viral etiology. Discussed that antibiotics are not indicated for viral infections. Pt will be discharged with symptomatic treatment.  Recommend follow-up with primary care physician for reevaluation of symptoms.  Discussed strict ED return precautions. Pt verbalized understanding of and agreement with plan and is safe for discharge home at this time.  No complaints prior to discharge.  Final Clinical Impressions(s) / ED Diagnoses   Final diagnoses:  Viral URI with cough    ED Discharge Orders        Ordered    fluticasone (FLONASE) 50 MCG/ACT nasal spray  Daily     05/29/17 1357    benzonatate (TESSALON) 100 MG capsule  3 times daily PRN     05/29/17 1357       Renita Papa, PA-C 05/29/17 1552    Margette Fast, MD 05/29/17 1659

## 2017-06-06 DIAGNOSIS — F17210 Nicotine dependence, cigarettes, uncomplicated: Secondary | ICD-10-CM | POA: Diagnosis not present

## 2017-06-06 DIAGNOSIS — I10 Essential (primary) hypertension: Secondary | ICD-10-CM | POA: Diagnosis not present

## 2017-06-06 DIAGNOSIS — R05 Cough: Secondary | ICD-10-CM | POA: Insufficient documentation

## 2017-06-06 DIAGNOSIS — Z79899 Other long term (current) drug therapy: Secondary | ICD-10-CM | POA: Diagnosis not present

## 2017-06-06 NOTE — ED Triage Notes (Signed)
Pt complains of a dry non productive cough for a month, she had a negative xray last week  She has tried several OTC meds with no relief

## 2017-06-07 ENCOUNTER — Emergency Department (HOSPITAL_COMMUNITY): Payer: Medicaid Other

## 2017-06-07 ENCOUNTER — Emergency Department (HOSPITAL_COMMUNITY)
Admission: EM | Admit: 2017-06-07 | Discharge: 2017-06-07 | Disposition: A | Discharge status: Home / Self Care | Payer: Medicaid Other | Attending: Emergency Medicine | Admitting: Emergency Medicine

## 2017-06-07 DIAGNOSIS — R05 Cough: Secondary | ICD-10-CM

## 2017-06-07 DIAGNOSIS — R059 Cough, unspecified: Secondary | ICD-10-CM

## 2017-06-07 MED ORDER — IPRATROPIUM-ALBUTEROL 0.5-2.5 (3) MG/3ML IN SOLN: 3.0000 mL | Freq: Once | RESPIRATORY_TRACT | Status: DC

## 2017-06-07 MED ORDER — PREDNISONE 20 MG PO TABS: 40.0000 mg | ORAL_TABLET | Freq: Every day | ORAL | 0 refills | Status: DC

## 2017-06-07 MED ORDER — ALBUTEROL SULFATE HFA 108 (90 BASE) MCG/ACT IN AERS
2.0000 | INHALATION_SPRAY | RESPIRATORY_TRACT | Status: DC | PRN
  Administered 2017-06-07: 2 via RESPIRATORY_TRACT
  Filled 2017-06-07: qty 6.7

## 2017-06-07 MED ORDER — AZITHROMYCIN 250 MG PO TABS: 250.0000 mg | ORAL_TABLET | Freq: Every day | ORAL | 0 refills | Status: DC

## 2017-06-07 MED ORDER — IPRATROPIUM-ALBUTEROL 0.5-2.5 (3) MG/3ML IN SOLN
3.0000 mL | Freq: Once | RESPIRATORY_TRACT | Status: AC
  Administered 2017-06-07: 3 mL via RESPIRATORY_TRACT
  Filled 2017-06-07: qty 3

## 2017-06-07 NOTE — ED Provider Notes (Signed)
[place] DEPT Provider Note   CSN: [id number] Arrival date & time: 06/06/17  2231     History   Chief Complaint Chief Complaint  Patient presents with  . Cough    HPI Panzy A Deshong is a 42 y.o. female.  Patient presents to the emergency department with a chief complaint of cough.  She reports having had a cough for the past month.  She reports having tried nasal sprays, cough suppressants, but nothing is working.  She has not tried antibiotics.  She denies any history of asthma.  She denies fevers or chills.  She denies any productive cough.  There are no other associated symptoms.  The history is provided by the patient. No language interpreter was used.    Past Medical History:  Diagnosis Date  . H/O pilonidal cyst 2002   . Hypertension   . PID (acute pelvic inflammatory disease) 2006   . Pneumonia   . Vitamin D deficiency 2015     Patient Active Problem List   Diagnosis Date Noted  . HTN (hypertension) 02/27/2015  . Unintended weight loss 10/27/2014  . Allergic rhinitis 06/23/2014  . Elevated BP 06/23/2014  . Vitamin D insufficiency 03/03/2014  . Pilonidal cyst 03/03/2014  . Broken teeth 03/03/2014  . Poor sleep pattern 03/03/2014  . Obesity (BMI 30-39.9) 03/03/2014  . Current smoker 03/03/2014  . Anemia 05/27/2013    Past Surgical History:  Procedure Laterality Date  . CESAREAN SECTION    . CYST EXCISION Right 2003   hand     OB History    Gravida  6   Para  4   Term  4   Preterm      AB  2   Living  4     SAB  1   TAB      Ectopic  1   Multiple  0   Live Births  4            Home Medications    Prior to Admission medications   Medication Sig Start Date End Date Taking? Authorizing Provider  amLODipine (NORVASC) 10 MG tablet Take 1 tablet (10 mg total) by mouth daily. 03/03/17   Shelly Bombard, MD  benzonatate (TESSALON) 100 MG capsule Take 1 capsule (100 mg total) by mouth 3 (three)  times daily as needed for cough. 05/29/17   Rodell Perna A, PA-C  butalbital-acetaminophen-caffeine (FIORICET, ESGIC) [phone] MG tablet Take 2 tablets by mouth every 6 (six) hours as needed for headache. 03/06/17 03/06/18  Shelly Bombard, MD  cetirizine (ZYRTEC) 10 MG tablet Take 1 tablet (10 mg total) by mouth daily. Patient not taking: Reported on 03/03/2017 02/21/17   Ok Edwards, PA-C  fluticasone [facility]) 50 MCG/ACT nasal spray Place 2 sprays into both nostrils daily. 05/29/17   Nils Flack, Mina A, PA-C  hydrochlorothiazide (HYDRODIURIL) 25 MG tablet Take 1 tablet (25 mg total) by mouth daily. 03/03/17   Shelly Bombard, MD  HYDROcodone-acetaminophen (NORCO/VICODIN) 5-325 MG tablet Take 1-2 tablets by mouth every 6 (six) hours as needed for severe pain. 03/26/17   Joy, Shawn C, PA-C  methocarbamol (ROBAXIN) 500 MG tablet Take 1 tablet (500 mg total) by mouth 2 (two) times daily. 03/26/17   Joy, Shawn C, PA-C  metroNIDAZOLE (FLAGYL) 500 MG tablet Take 1 tablet (500 mg total) by mouth 2 (two) times daily. 03/03/17   Shelly Bombard, MD  naproxen (NAPROSYN) 500 MG tablet Take 1 tablet (500  mg total) by mouth 2 (two) times daily. 03/26/17   Joy, Shawn C, PA-C  predniSONE (STERAPRED UNI-PAK 21 TAB) 10 MG (21) TBPK tablet Take 6 tabs by mouth daily  for 2 days, then 5 tabs for 2 days, then 4 tabs for 2 days, then 3 tabs for 2 days, 2 tabs for 2 days, then 1 tab by mouth daily for 2 days 03/26/17   Joy, Shawn C, PA-C  Prenat-Fe Poly-Methfol-FA-DHA (VITAFOL ULTRA) 29-0.6-0.4-200 MG CAPS Take 1 capsule by mouth daily before breakfast. 03/03/17   Shelly Bombard, MD  Prenatal Vit-Fe Fumarate-FA (PRENATAL VITAMIN PO) Take 1 tablet by mouth daily.     [provider]  senna-docusate (SENOKOT-S) 8.6-50 MG tablet Take 2 tablets by mouth 2 (two) times daily. Patient not taking: Reported on 01/13/2016 12/18/15   Morene Crocker, CNM    Family History Family History  Problem Relation Age of Onset  .  Hypertension Mother   . Diabetes Mother   . Asthma Mother   . COPD Mother   . Depression Mother   . Miscarriages / [place] Mother   . Vision loss Mother   . Heart disease Mother   . Parkinson's disease Father   . Diabetes Maternal Aunt   . Diabetes Maternal Uncle   . Diabetes Cousin     Social History Social History   Tobacco Use  . Smoking status: Current Some Day Smoker    Packs/day: 0.50    Years: 20.00    Pack years: 10.00    Types: Cigarettes  . Smokeless tobacco: Never Used  Substance Use Topics  . Alcohol use: No    Alcohol/week: 0.0 oz  . Drug use: No     Allergies   Patient has no known allergies.   Review of Systems Review of Systems  All other systems reviewed and are negative.    Physical Exam Updated Vital Signs BP (!) 140/95 (BP Location: Left Arm)   Pulse 86   Temp 98.1 F (36.7 C) (Oral)   Resp 15   LMP 05/25/2017 (Approximate)   SpO2 100%   Physical Exam  Constitutional: She is oriented to person, place, and time. She appears well-developed and well-nourished.  HENT:  Head: Normocephalic and atraumatic.  Eyes: Pupils are equal, round, and reactive to light. Conjunctivae and EOM are normal.  Neck: Normal range of motion. Neck supple.  Cardiovascular: Normal rate and regular rhythm. Exam reveals no gallop and no friction rub.  No murmur heard. Pulmonary/Chest: Effort normal. No respiratory distress. She has no wheezes. She has no rales. She exhibits no tenderness.  Rhonchi  Abdominal: Soft. Bowel sounds are normal. She exhibits no distension and no mass. There is no tenderness. There is no rebound and no guarding.  Musculoskeletal: Normal range of motion. She exhibits no edema or tenderness.  Neurological: She is alert and oriented to person, place, and time.  Skin: Skin is warm and dry.  Psychiatric: She has a normal mood and affect. Her behavior is normal. Judgment and thought content normal.  Nursing note and vitals  reviewed.    ED Treatments / Results  Labs (all labs ordered are listed, but only abnormal results are displayed) Labs Reviewed - No data to display  EKG None  Radiology Dg Chest 2 View  Result Date: 06/07/2017 CLINICAL DATA:  Cough and shortness of breath for 2 weeks. No improvement. EXAM: CHEST - 2 VIEW COMPARISON:  05/29/2017 FINDINGS: The heart size and mediastinal contours are within  normal limits. Both lungs are clear. The visualized skeletal structures are unremarkable. IMPRESSION: No active cardiopulmonary disease. Electronically Signed   By: Lucienne Capers M.D.   On: 06/07/2017 02:13    Procedures Procedures (including critical care time)  Medications Ordered in ED Medications  ipratropium-albuterol (DUONEB) 0.5-2.5 (3) MG/3ML nebulizer solution 3 mL (has no administration in time range)     Initial Impression / Assessment and Plan / ED Course  I have reviewed the triage vital signs and the nursing notes.  Pertinent labs & imaging results that were available during my care of the patient were reviewed by me and considered in my medical decision making (see chart for details).    Patient with cough x1 month.  Her vital signs are stable.  O2 saturation is normal.  She is afebrile.  She does have diffuse rhonchi.  We will give breathing treatment.  She may benefit from some prednisone, and inhaler, and possibly course of antibiotic.  Minimal improvement with breathing treatment.  Vital signs are stable.  Will discharge home per plan above.  Final Clinical Impressions(s) / ED Diagnoses   Final diagnoses:  Cough    ED Discharge Orders        Ordered    predniSONE (DELTASONE) 20 MG tablet  Daily     06/07/17 0323    azithromycin (ZITHROMAX) 250 MG tablet  Daily     06/07/17 0323       Montine Circle, PA-C 06/07/17 1572    Rolland Porter, MD 06/07/17 [phone]

## 2017-07-12 ENCOUNTER — Ambulatory Visit (INDEPENDENT_AMBULATORY_CARE_PROVIDER_SITE_OTHER): Payer: Medicaid Other | Admitting: Physician Assistant

## 2017-07-12 ENCOUNTER — Encounter (INDEPENDENT_AMBULATORY_CARE_PROVIDER_SITE_OTHER): Payer: Self-pay | Admitting: Physician Assistant

## 2017-07-12 ENCOUNTER — Other Ambulatory Visit: Payer: Self-pay

## 2017-07-12 ENCOUNTER — Telehealth: Payer: Self-pay | Admitting: Obstetrics

## 2017-07-12 ENCOUNTER — Other Ambulatory Visit (INDEPENDENT_AMBULATORY_CARE_PROVIDER_SITE_OTHER): Payer: Self-pay | Admitting: Physician Assistant

## 2017-07-12 VITALS — BP 139/99 | HR 69 | Temp 98.1°F | Ht 67.0 in | Wt 227.2 lb

## 2017-07-12 DIAGNOSIS — E119 Type 2 diabetes mellitus without complications: Secondary | ICD-10-CM

## 2017-07-12 DIAGNOSIS — M546 Pain in thoracic spine: Secondary | ICD-10-CM | POA: Diagnosis not present

## 2017-07-12 DIAGNOSIS — R229 Localized swelling, mass and lump, unspecified: Secondary | ICD-10-CM | POA: Diagnosis not present

## 2017-07-12 DIAGNOSIS — R5383 Other fatigue: Secondary | ICD-10-CM

## 2017-07-12 DIAGNOSIS — G8929 Other chronic pain: Secondary | ICD-10-CM

## 2017-07-12 DIAGNOSIS — Z131 Encounter for screening for diabetes mellitus: Secondary | ICD-10-CM | POA: Diagnosis not present

## 2017-07-12 DIAGNOSIS — N62 Hypertrophy of breast: Secondary | ICD-10-CM | POA: Diagnosis not present

## 2017-07-12 DIAGNOSIS — I10 Essential (primary) hypertension: Secondary | ICD-10-CM

## 2017-07-12 LAB — POCT GLYCOSYLATED HEMOGLOBIN (HGB A1C): Hemoglobin A1C: 5.6 % (ref 4.0–5.6)

## 2017-07-12 MED ORDER — AMLODIPINE BESYLATE 10 MG PO TABS: 10.0000 mg | ORAL_TABLET | Freq: Every day | ORAL | 11 refills | Status: DC

## 2017-07-12 MED ORDER — MELOXICAM 7.5 MG PO TABS: 7.5000 mg | ORAL_TABLET | Freq: Every day | ORAL | 1 refills | Status: DC

## 2017-07-12 MED ORDER — CYCLOBENZAPRINE HCL 10 MG PO TABS: 10.0000 mg | ORAL_TABLET | Freq: Every day | ORAL | 1 refills | Status: DC

## 2017-07-12 MED ORDER — HYDROCHLOROTHIAZIDE 25 MG PO TABS
25.0000 mg | ORAL_TABLET | Freq: Every day | ORAL | 11 refills | Status: DC
Start: 2017-07-12 — End: 2018-02-08

## 2017-07-12 NOTE — Patient Instructions (Signed)

## 2017-07-12 NOTE — Telephone Encounter (Signed)
Pt called stating she wants to speak to the nurse, she has questions. Pt did not want to disclose the reasons for her call and hung up abruptly when asked.

## 2017-07-12 NOTE — Progress Notes (Signed)
Subjective:  Patient ID: Emily Saunders, female    DOB: March 07, 1975  Age: 42 y.o. MRN: [id number]  CC: establish care  HPI Emily Saunders is a 42 y.o. female with a medical history of HTN, PID, PNA, pilonidal cyst, and vit D deficiency presents as a new patient with a "knot" in right axilla. Onset was two months ago. Not tender, no bleeding, no suppuration, and no impediment to arm function.    Would like a referral to a surgeon for breast reduction. Reports having chronic thoracic back pain due to weight of breasts.      BP noted to be elevated today. Pt admits to not taking HCTZ because she does not know if it would interact with Amlodipine.      Feels fatigued and does not know why. No bleeding, no sleep issues, or previous thyroid issues. Has a hx of vit D deficiency.    Outpatient Medications Prior to Visit  Medication Sig Dispense Refill  . amLODipine (NORVASC) 10 MG tablet Take 1 tablet (10 mg total) by mouth daily. 30 tablet 11  . butalbital-acetaminophen-caffeine (FIORICET, ESGIC) 50-325-40 MG tablet Take 2 tablets by mouth every 6 (six) hours as needed for headache. (Patient not taking: Reported on 07/12/2017) 30 tablet 0  . cetirizine (ZYRTEC) 10 MG tablet Take 1 tablet (10 mg total) by mouth daily. (Patient not taking: Reported on 03/03/2017) 15 tablet 0  . fluticasone (FLONASE) 50 MCG/ACT nasal spray Place 2 sprays into both nostrils daily. (Patient not taking: Reported on 07/12/2017) 16 g 0  . hydrochlorothiazide (HYDRODIURIL) 25 MG tablet Take 1 tablet (25 mg total) by mouth daily. (Patient not taking: Reported on 07/12/2017) 30 tablet 11  . methocarbamol (ROBAXIN) 500 MG tablet Take 1 tablet (500 mg total) by mouth 2 (two) times daily. (Patient not taking: Reported on 07/12/2017) 20 tablet 0  . Prenat-Fe Poly-Methfol-FA-DHA (VITAFOL ULTRA) 29-0.6-0.4-200 MG CAPS Take 1 capsule by mouth daily before breakfast. (Patient not taking: Reported on 07/12/2017) 30 capsule 11  . Prenatal Vit-Fe  Fumarate-FA (PRENATAL VITAMIN PO) Take 1 tablet by mouth daily.     Marland Kitchen senna-docusate (SENOKOT-S) 8.6-50 MG tablet Take 2 tablets by mouth 2 (two) times daily. (Patient not taking: Reported on 01/13/2016) 90 tablet 2  . azithromycin (ZITHROMAX) 250 MG tablet Take 1 tablet (250 mg total) by mouth daily. Take first 2 tablets together, then 1 every day until finished. 6 tablet 0  . benzonatate (TESSALON) 100 MG capsule Take 1 capsule (100 mg total) by mouth 3 (three) times daily as needed for cough. 21 capsule 0  . HYDROcodone-acetaminophen (NORCO/VICODIN) 5-325 MG tablet Take 1-2 tablets by mouth every 6 (six) hours as needed for severe pain. 10 tablet 0  . metroNIDAZOLE (FLAGYL) 500 MG tablet Take 1 tablet (500 mg total) by mouth 2 (two) times daily. 14 tablet 2  . naproxen (NAPROSYN) 500 MG tablet Take 1 tablet (500 mg total) by mouth 2 (two) times daily. 30 tablet 0  . predniSONE (DELTASONE) 20 MG tablet Take 2 tablets (40 mg total) by mouth daily. 10 tablet 0   No facility-administered medications prior to visit.      ROS Review of Systems  Constitutional: Positive for malaise/fatigue. Negative for chills and fever.  Eyes: Negative for blurred vision.  Respiratory: Negative for shortness of breath.   Cardiovascular: Negative for chest pain and palpitations.  Gastrointestinal: Negative for abdominal pain and nausea.  Genitourinary: Negative for dysuria and hematuria.  Musculoskeletal: Positive for  back pain. Negative for joint pain and myalgias.  Skin: Negative for rash.       Bump in right armpit  Neurological: Negative for tingling and headaches.  Psychiatric/Behavioral: Negative for depression. The patient is not nervous/anxious.     Objective:  Ht 5\' 7"  (1.702 m)   Wt 227 lb 3.2 oz (103.1 kg)   LMP 06/22/2017 (Exact Date)   Breastfeeding? No   BMI 35.58 kg/m   Vitals:   07/12/17 1030  BP: (!) 139/99  Pulse: 69  Temp: 98.1 F (36.7 C)  SpO2: 97%     Physical Exam   Constitutional: She is oriented to person, place, and time.  Well developed, overweight, NAD, polite, large breasted  HENT:  Head: Normocephalic and atraumatic.  Eyes: No scleral icterus.  Neck: Normal range of motion. Neck supple. No thyromegaly present.  Cardiovascular: Normal rate, regular rhythm, normal heart sounds and intact distal pulses. Exam reveals no gallop and no friction rub.  No murmur heard. Pulmonary/Chest: Effort normal and breath sounds normal.  Musculoskeletal: She exhibits no edema.  Mildly increased muscular tonicity of the trapezius bilaterally  Neurological: She is alert and oriented to person, place, and time.  Skin: Skin is warm and dry. No rash noted. No erythema. No pallor.  Small subdermal cyst in the right axilla. No suppuration, erythema, edema, or bleeding.  Psychiatric: She has a normal mood and affect. Her behavior is normal. Thought content normal.  Vitals reviewed.    Assessment & Plan:    1. Screening for diabetes mellitus - HgB A1c 5.6%  2. Fatigue, unspecified type - CBC with Differential - Comprehensive metabolic panel - TSH  3. Skin nodule - Ambulatory referral to Dermatology for axillary nodule.   4. Chronic thoracic back pain, unspecified back pain laterality - meloxicam (MOBIC) 7.5 MG tablet; Take 1 tablet (7.5 mg total) by mouth daily.  Dispense: 30 tablet; Refill: 1 - cyclobenzaprine (FLEXERIL) 10 MG tablet; Take 1 tablet (10 mg total) by mouth at bedtime.  Dispense: 15 tablet; Refill: 1  5. Hypertrophy of breast - Ambulatory referral to Plastic Surgery  6. Hypertension, unspecified type - Advised to take medications as directed; refill hydrochlorothiazide (HYDRODIURIL) 25 MG tablet; Take 1 tablet (25 mg total) by mouth daily.  Dispense: 30 tablet; Refill: 11 - Refill amLODipine (NORVASC) 10 MG tablet; Take 1 tablet (10 mg total) by mouth daily.  Dispense: 30 tablet; Refill: 11 - CBC with Differential - Comprehensive metabolic  panel - TSH   Meds ordered this encounter  Medications  . meloxicam (MOBIC) 7.5 MG tablet    Sig: Take 1 tablet (7.5 mg total) by mouth daily.    Dispense:  30 tablet    Refill:  1    Order Specific Question:   Supervising Provider    Answer:   Charlott Rakes [[id number]]  . cyclobenzaprine (FLEXERIL) 10 MG tablet    Sig: Take 1 tablet (10 mg total) by mouth at bedtime.    Dispense:  15 tablet    Refill:  1    Order Specific Question:   Supervising Provider    Answer:   Charlott Rakes [[id number]]  . hydrochlorothiazide (HYDRODIURIL) 25 MG tablet    Sig: Take 1 tablet (25 mg total) by mouth daily.    Dispense:  30 tablet    Refill:  11    Order Specific Question:   Supervising Provider    Answer:   Charlott Rakes [[id number]]  . amLODipine (NORVASC) 10 MG tablet  Sig: Take 1 tablet (10 mg total) by mouth daily.    Dispense:  30 tablet    Refill:  11    Order Specific Question:   Supervising Provider    Answer:   Charlott Rakes [[id number]]    Follow-up: Return in about 1 month (around 08/09/2017) for HTN.   Clent Demark PA

## 2017-07-13 ENCOUNTER — Telehealth (INDEPENDENT_AMBULATORY_CARE_PROVIDER_SITE_OTHER): Payer: Self-pay

## 2017-07-13 LAB — COMPREHENSIVE METABOLIC PANEL
ALT: 12 IU/L (ref 0–32)
AST: 14 IU/L (ref 0–40)
Albumin/Globulin Ratio: 2.1 (ref 1.2–2.2)
Albumin: 4.6 g/dL (ref 3.5–5.5)
Alkaline Phosphatase: 45 IU/L (ref 39–117)
BUN/Creatinine Ratio: 11 (ref 9–23)
BUN: 10 mg/dL (ref 6–24)
Bilirubin Total: <0.2 mg/dL (ref 0.0–1.2)
CALCIUM: 9.2 mg/dL (ref 8.7–10.2)
CO2: 22 mmol/L (ref 20–29)
CREATININE: 0.94 mg/dL (ref 0.57–1.00)
Chloride: 107 mmol/L — ABNORMAL HIGH (ref 96–106)
GFR calc Af Amer: 87 mL/min/{1.73_m2} (ref >59)
GFR, EST NON AFRICAN AMERICAN: 75 mL/min/{1.73_m2} (ref >59)
GLOBULIN, TOTAL: 2.2 g/dL (ref 1.5–4.5)
Glucose: 85 mg/dL (ref 65–99)
Potassium: 4.1 mmol/L (ref 3.5–5.2)
SODIUM: 142 mmol/L (ref 134–144)
Total Protein: 6.8 g/dL (ref 6.0–8.5)

## 2017-07-13 LAB — CBC WITH DIFFERENTIAL/PLATELET
Basophils Absolute: 0 10*3/uL (ref 0.0–0.2)
Basos: 0 %
EOS (ABSOLUTE): 0.3 10*3/uL (ref 0.0–0.4)
EOS: 5 %
HEMATOCRIT: 38.7 % (ref 34.0–46.6)
Hemoglobin: 12.8 g/dL (ref 11.1–15.9)
IMMATURE GRANULOCYTES: 0 %
Immature Grans (Abs): 0 10*3/uL (ref 0.0–0.1)
LYMPHS: 35 %
Lymphocytes Absolute: 2.2 10*3/uL (ref 0.7–3.1)
MCH: 30.3 pg (ref 26.6–33.0)
MCHC: 33.1 g/dL (ref 31.5–35.7)
MCV: 92 fL (ref 79–97)
MONOCYTES: 8 %
MONOS ABS: 0.5 10*3/uL (ref 0.1–0.9)
Neutrophils Absolute: 3.1 10*3/uL (ref 1.4–7.0)
Neutrophils: 52 %
Platelets: 228 10*3/uL (ref 150–450)
RBC: 4.23 x10E6/uL (ref 3.77–5.28)
RDW: 15.2 % (ref 12.3–15.4)
WBC: 6.1 10*3/uL (ref 3.4–10.8)

## 2017-07-13 LAB — TSH: TSH: 0.998 u[IU]/mL (ref 0.450–4.500)

## 2017-07-13 NOTE — Telephone Encounter (Signed)
-----   Message from Clent Demark, PA-C sent at 07/13/2017 12:37 PM EDT ----- Labs show she may be a little dehydrated. Drink more water. Otherwise labs are completely normal.

## 2017-07-13 NOTE — Telephone Encounter (Signed)
Patient is aware that labs indicate she is a little dehydrated advised to drink more water. Informed that all other labs were normal. Nat Christen, CMA

## 2017-08-08 DIAGNOSIS — H5213 Myopia, bilateral: Secondary | ICD-10-CM | POA: Diagnosis not present

## 2017-08-08 DIAGNOSIS — H524 Presbyopia: Secondary | ICD-10-CM | POA: Diagnosis not present

## 2017-08-08 DIAGNOSIS — R229 Localized swelling, mass and lump, unspecified: Secondary | ICD-10-CM | POA: Diagnosis not present

## 2017-08-11 ENCOUNTER — Encounter (INDEPENDENT_AMBULATORY_CARE_PROVIDER_SITE_OTHER): Payer: Self-pay | Admitting: Physician Assistant

## 2017-08-11 ENCOUNTER — Ambulatory Visit (INDEPENDENT_AMBULATORY_CARE_PROVIDER_SITE_OTHER): Payer: Medicaid Other | Admitting: Physician Assistant

## 2017-08-11 ENCOUNTER — Other Ambulatory Visit: Payer: Self-pay

## 2017-08-11 VITALS — BP 130/88 | HR 71 | Temp 98.3°F | Ht 67.0 in | Wt 222.2 lb

## 2017-08-11 DIAGNOSIS — I10 Essential (primary) hypertension: Secondary | ICD-10-CM

## 2017-08-11 NOTE — Progress Notes (Signed)
   Subjective:  Patient ID: Emily Saunders, female    DOB: [birth year]  Age: 42 y.o. MRN: [id number]  CC: f/u HTN  HPI Emily Saunders is a 42 y.o. female with a medical history of HTN, PID, PNA, pilonidal cyst, and vit D deficiency presents for HTN f/u. Last BP 139/99 mmHg. Had not been taking HCTZ at the time because she thought it may interact with Amlodipine. Advised to take HCTZ and amlodipine as directed. BP 130/88 mmHg today. Does not exercise regularly. Tries to abstain from adding salt to foods. Denies CP, palpitations, SOB, HA, tingling, numbness, abdominal pain, f/c/n/v, rash, swelling, or GI/GU sxs.       Outpatient Medications Prior to Visit  Medication Sig Dispense Refill  . amLODipine (NORVASC) 10 MG tablet Take 1 tablet (10 mg total) by mouth daily. 30 tablet 11  . cyclobenzaprine (FLEXERIL) 10 MG tablet Take 1 tablet (10 mg total) by mouth at bedtime. 15 tablet 1  . hydrochlorothiazide (HYDRODIURIL) 25 MG tablet Take 1 tablet (25 mg total) by mouth daily. 30 tablet 11  . meloxicam (MOBIC) 7.5 MG tablet Take 1 tablet (7.5 mg total) by mouth daily. 30 tablet 1  . Prenatal Vit-Fe Fumarate-FA (PRENATAL VITAMIN PO) Take 1 tablet by mouth daily.      No facility-administered medications prior to visit.      ROS Review of Systems  Constitutional: Negative for chills, fever and malaise/fatigue.  Eyes: Negative for blurred vision.  Respiratory: Negative for shortness of breath.   Cardiovascular: Negative for chest pain and palpitations.  Gastrointestinal: Negative for abdominal pain and nausea.  Genitourinary: Negative for dysuria and hematuria.  Musculoskeletal: Negative for joint pain and myalgias.  Skin: Negative for rash.  Neurological: Negative for tingling and headaches.  Psychiatric/Behavioral: Negative for depression. The patient is not nervous/anxious.     Objective:  Ht 5\' 7"  (1.702 m)   Wt 222 lb 3.2 oz (100.8 kg)   BMI 34.80 kg/m   Vitals:   08/11/17  1044  BP: 130/88  Pulse: 71  Temp: 98.3 F (36.8 C)  TempSrc: Oral  SpO2: 97%  Weight: 222 lb 3.2 oz (100.8 kg)  Height: 5\' 7"  (1.702 m)      Physical Exam  Constitutional: She is oriented to person, place, and time.  Well developed, well nourished, NAD, polite  HENT:  Head: Normocephalic and atraumatic.  Eyes: No scleral icterus.  Neck: Normal range of motion. Neck supple. No thyromegaly present.  Cardiovascular: Normal rate, regular rhythm and normal heart sounds.  Pulmonary/Chest: Effort normal and breath sounds normal.  Musculoskeletal: She exhibits no edema.  Neurological: She is alert and oriented to person, place, and time.  Skin: Skin is warm and dry. No rash noted. No erythema. No pallor.  Psychiatric: She has a normal mood and affect. Her behavior is normal. Thought content normal.  Vitals reviewed.    Assessment & Plan:   1. Hypertension, unspecified type - Continue on Amlodipine and HCTZ - Advised to review nutrition labels and to not surpass 2000 mg of sodium per day. Weight loss of 10% of her body weight will also naturally reduce blood pressure.  - Microalbumin / creatinine urine ratio - Lipid panel     Follow-up: Return in about 6 months (around 02/11/2018) for HTN.   Clent Demark PA

## 2017-08-11 NOTE — Patient Instructions (Signed)

## 2017-08-12 LAB — LIPID PANEL
CHOL/HDL RATIO: 3.9 ratio (ref 0.0–4.4)
Cholesterol, Total: 132 mg/dL (ref 100–199)
HDL: 34 mg/dL — ABNORMAL LOW (ref >39)
LDL CALC: 76 mg/dL (ref 0–99)
TRIGLYCERIDES: 108 mg/dL (ref 0–149)
VLDL Cholesterol Cal: 22 mg/dL (ref 5–40)

## 2017-08-12 LAB — MICROALBUMIN / CREATININE URINE RATIO
CREATININE, UR: 276 mg/dL
MICROALBUM., U, RANDOM: 25.8 ug/mL
Microalb/Creat Ratio: 9.3 mg/g creat (ref 0.0–30.0)

## 2017-08-16 ENCOUNTER — Telehealth (INDEPENDENT_AMBULATORY_CARE_PROVIDER_SITE_OTHER): Payer: Self-pay

## 2017-08-16 DIAGNOSIS — H5213 Myopia, bilateral: Secondary | ICD-10-CM | POA: Diagnosis not present

## 2017-08-16 NOTE — Telephone Encounter (Signed)
Patient aware of controlled lipids. And no excessive excretion of urinary proteins. Nat Christen, CMA

## 2017-08-16 NOTE — Telephone Encounter (Signed)
-----   Message from Clent Demark, PA-C sent at 08/14/2017  1:10 PM EDT ----- Lipids controlled. No excessive excretion of urinary proteins.

## 2017-08-17 DIAGNOSIS — G8929 Other chronic pain: Secondary | ICD-10-CM | POA: Diagnosis not present

## 2017-08-17 DIAGNOSIS — M549 Dorsalgia, unspecified: Secondary | ICD-10-CM | POA: Diagnosis not present

## 2017-08-17 DIAGNOSIS — M542 Cervicalgia: Secondary | ICD-10-CM | POA: Diagnosis not present

## 2017-08-17 DIAGNOSIS — N62 Hypertrophy of breast: Secondary | ICD-10-CM | POA: Diagnosis not present

## 2017-09-11 ENCOUNTER — Ambulatory Visit: Payer: Medicaid Other | Admitting: Obstetrics

## 2017-10-03 ENCOUNTER — Ambulatory Visit (INDEPENDENT_AMBULATORY_CARE_PROVIDER_SITE_OTHER): Payer: Medicaid Other | Admitting: Obstetrics

## 2017-10-03 ENCOUNTER — Encounter: Payer: Self-pay | Admitting: Obstetrics

## 2017-10-03 ENCOUNTER — Encounter: Payer: Self-pay | Admitting: *Deleted

## 2017-10-03 VITALS — BP 111/81 | HR 79 | Ht 67.0 in | Wt 222.2 lb

## 2017-10-03 DIAGNOSIS — Z872 Personal history of diseases of the skin and subcutaneous tissue: Secondary | ICD-10-CM

## 2017-10-03 DIAGNOSIS — Z30432 Encounter for removal of intrauterine contraceptive device: Secondary | ICD-10-CM | POA: Diagnosis not present

## 2017-10-03 MED ORDER — CLINDAMYCIN HCL 300 MG PO CAPS: 300.0000 mg | ORAL_CAPSULE | Freq: Three times a day (TID) | ORAL | 4 refills | Status: DC

## 2017-10-03 NOTE — Progress Notes (Signed)
Patient is in the office for IUD removal, inserted 03-03-16, complains of bad cramps since having the device. Pt is unsure about alternate BC options.

## 2017-10-03 NOTE — Progress Notes (Signed)
    GYNECOLOGY OFFICE PROCEDURE NOTE  Emily Saunders is a 42 y.o. [id number] here for [place] IUD removal. No GYN concerns.  Last pap smear was on 06-18-2015 and was normal.  IUD Removal  Patient identified, informed consent performed, consent signed.  Patient was in the dorsal lithotomy position, normal external genitalia was noted.  A speculum was placed in the patient's vagina, normal discharge was noted, no lesions. The cervix was visualized, no lesions, no abnormal discharge.  The strings of the IUD were grasped and pulled using dressing forceps. The IUD was removed in its entirety.   Patient tolerated the procedure well.    Patient will use condoms for contraception.  Routine preventative health maintenance measures emphasized.   Shelly Bombard, MD, [place] for [employer] [facility], [place]

## 2017-10-03 NOTE — Patient Instructions (Signed)
Pilonidal Cyst A pilonidal cyst is a fluid-filled sac. It forms beneath the skin near your tailbone, at the top of the crease of your buttocks. A pilonidal cyst that is not large or infected may not cause symptoms or problems. If the cyst becomes irritated or infected, it may fill with pus. This causes pain and swelling (pilonidal abscess). An infected cyst may need to be treated with medicine, drained, or removed. What are the causes? The cause of a pilonidal cyst is not known. One cause may be a hair that grows into your skin (ingrown hair). What increases the risk? Pilonidal cysts are more common in boys and men. Risk factors include:  Having lots of hair near the crease of the buttocks.  Being overweight.  Having a pilonidal dimple.  Wearing tight clothing.  Not bathing or showering frequently.  Sitting for long periods of time.  What are the signs or symptoms? Signs and symptoms of a pilonidal cyst may include:  Redness.  Pain and tenderness.  Warmth.  Swelling.  Pus.  Fever.  How is this diagnosed? Your health care provider may diagnose a pilonidal cyst based on your symptoms and a physical exam. The health care provider may do a blood test to check for infection. If your cyst is draining pus, your health care provider may take a sample of the drainage to be tested at a laboratory. How is this treated? Surgery is the usual treatment for an infected pilonidal cyst. You may also have to take medicines before surgery. The type of surgery you have depends on the size and severity of the infected cyst. The different kinds of surgery include:  Incision and drainage. This is a procedure to open and drain the cyst.  Marsupialization. In this procedure, a large cyst or abscess may be opened and kept open by stitching the edges of the skin to the cyst walls.  Cyst removal. This procedure involves opening the skin and removing all or part of the cyst.  Follow these  instructions at home:  Follow all of your surgeon's instructions carefully if you had surgery.  Take medicines only as directed by your health care provider.  If you were prescribed an antibiotic medicine, finish it all even if you start to feel better.  Keep the area around your pilonidal cyst clean and dry.  Clean the area as directed by your health care provider. Pat the area dry with a clean towel. Do not rub it as this may cause bleeding.  Remove hair from the area around the cyst as directed by your health care provider.  Do not wear tight clothing or sit in one place for long periods of time.  There are many different ways to close and cover an incision, including stitches, skin glue, and adhesive strips. Follow your health care provider's instructions on: ? Incision care. ? Bandage (dressing) changes and removal. ? Incision closure removal. Contact a health care provider if:  You have drainage, redness, swelling, or pain at the site of the cyst.  You have a fever. This information is not intended to replace advice given to you by your health care provider. Make sure you discuss any questions you have with your health care provider. Document Released: 01/22/2000 Document Revised: 07/02/2015 Document Reviewed: 06/13/2013 Elsevier Interactive Patient Education  2018 Elsevier Inc.  

## 2017-10-04 DIAGNOSIS — H524 Presbyopia: Secondary | ICD-10-CM | POA: Diagnosis not present

## 2017-11-02 ENCOUNTER — Telehealth: Payer: Self-pay

## 2017-11-02 NOTE — Telephone Encounter (Signed)
Returned call, no answer, left vm 

## 2017-11-22 ENCOUNTER — Other Ambulatory Visit: Payer: Self-pay

## 2017-11-22 ENCOUNTER — Encounter (HOSPITAL_COMMUNITY): Payer: Self-pay | Admitting: Emergency Medicine

## 2017-11-22 ENCOUNTER — Ambulatory Visit (HOSPITAL_COMMUNITY)
Admission: EM | Admit: 2017-11-22 | Discharge: 2017-11-22 | Disposition: A | Discharge status: Home / Self Care | Payer: Medicaid Other | Attending: Family Medicine | Admitting: Family Medicine

## 2017-11-22 DIAGNOSIS — L0501 Pilonidal cyst with abscess: Secondary | ICD-10-CM | POA: Diagnosis not present

## 2017-11-22 MED ORDER — IBUPROFEN 800 MG PO TABS
ORAL_TABLET | ORAL | Status: AC
  Filled 2017-11-22: qty 1

## 2017-11-22 MED ORDER — SULFAMETHOXAZOLE-TRIMETHOPRIM 800-160 MG PO TABS: 1.0000 | ORAL_TABLET | Freq: Two times a day (BID) | ORAL | 0 refills | Status: AC

## 2017-11-22 MED ORDER — IBUPROFEN 800 MG PO TABS
800.0000 mg | ORAL_TABLET | Freq: Once | ORAL | Status: AC
  Administered 2017-11-22: 800 mg via ORAL

## 2017-11-22 NOTE — ED Triage Notes (Signed)
Abscess to buttocks, noticed on Monday.  history of the same

## 2017-11-22 NOTE — Discharge Instructions (Signed)
Keep covered to avoid friction Take antibiotic as prescribed and to completion Return in 48 hours to have packing removed and wound reevaluated Return or go to the ED if you have any new or worsening symptoms such as fever, chills, nausea, vomiting, increased swelling, pain, redness or swelling, etc..Marland Kitchen

## 2017-11-22 NOTE — ED Provider Notes (Signed)
[facility]   [id number] 11/22/17 Arrival Time: 45   QT:[id number]  SUBJECTIVE:  Emily Saunders is a 42 y.o. female who presents with a possible abscess of her buttock. Onset abrupt, approximately 2 days ago. Has not tried OTC medications.  Symptoms made worse with sitting.  Reports previous symptoms in the past.  Denies fever, chills, nausea, vomiting, redness or swelling.    ROS: As per HPI.  Past Medical History:  Diagnosis Date  . H/O pilonidal cyst 2002   . Hypertension   . PID (acute pelvic inflammatory disease) 2006   . Pneumonia   . Vitamin D deficiency 2015    Past Surgical History:  Procedure Laterality Date  . CESAREAN SECTION    . CYST EXCISION Right 2003   hand   No Known Allergies No current facility-administered medications on file prior to encounter.    Current Outpatient Medications on File Prior to Encounter  Medication Sig Dispense Refill  . amLODipine (NORVASC) 10 MG tablet Take 1 tablet (10 mg total) by mouth daily. 30 tablet 11  . clindamycin (CLEOCIN) 300 MG capsule Take 1 capsule (300 mg total) by mouth 3 (three) times daily. 30 capsule 4  . hydrochlorothiazide (HYDRODIURIL) 25 MG tablet Take 1 tablet (25 mg total) by mouth daily. 30 tablet 11  . Prenatal Vit-Fe Fumarate-FA (PRENATAL VITAMIN PO) Take by mouth.     Social History   Socioeconomic History  . Marital status: Single    Spouse name: Not on file  . Number of children: Not on file  . Years of education: Not on file  . Highest education level: Not on file  Occupational History  . Not on file  Social Needs  . Financial resource strain: Not on file  . Food insecurity:    Worry: Not on file    Inability: Not on file  . Transportation needs:    Medical: Not on file    Non-medical: Not on file  Tobacco Use  . Smoking status: Current Some Day Smoker    Packs/day: 0.50    Years: 20.00    Pack years: 10.00    Types: Cigarettes  . Smokeless tobacco: Never Used    Substance and Sexual Activity  . Alcohol use: No    Alcohol/week: 0.0 standard drinks  . Drug use: No  . Sexual activity: Not Currently    Partners: Male    Birth control/protection: IUD  Lifestyle  . Physical activity:    Days per week: Not on file    Minutes per session: Not on file  . Stress: Not on file  Relationships  . Social connections:    Talks on phone: Not on file    Gets together: Not on file    Attends religious service: Not on file    Active member of club or organization: Not on file    Attends meetings of clubs or organizations: Not on file    Relationship status: Not on file  . Intimate partner violence:    Fear of current or ex partner: Not on file    Emotionally abused: Not on file    Physically abused: Not on file    Forced sexual activity: Not on file  Other Topics Concern  . Not on file  Social History Narrative   Works at [employer].   Up at 6 AM.    Gets 4 hs of sleep off and on.    Family History  Problem Relation Age of Onset  .  Hypertension Mother   . Diabetes Mother   . Asthma Mother   . COPD Mother   . Depression Mother   . Miscarriages / [place] Mother   . Vision loss Mother   . Heart disease Mother   . Parkinson's disease Father   . Diabetes Maternal Aunt   . Diabetes Maternal Uncle   . Diabetes Cousin     OBJECTIVE:  Vitals:   11/22/17 1809  BP: 114/83  Pulse: 99  Resp: 20  Temp: 98.3 F (36.8 C)  TempSrc: Oral  SpO2: 99%     General appearance: alert; no distress Skin: 4x4 cm area of induration with pustule 1 cm in diameter of her superior gluteal cleft, tender to touch; no active drainage Psychological: alert and cooperative; normal mood and affect  Procedure: Verbal consent obtained. Area over induration cleaned with betadine. Lidocaine 2% without epinephrine used to obtain local anesthesia. The most fluctuant portion of the abscess was incised with a #11 blade scalpel. Abscess cavity explored and  evacuated. Loculations broken up with a curved hemostat as best as possible given patient discomfort. Cavity packed with packing material and dressed with a clean gauze dressing. Minimal bleeding. No complications.  ASSESSMENT & PLAN:  1. Pilonidal abscess     Meds ordered this encounter  Medications  . sulfamethoxazole-trimethoprim (BACTRIM DS,SEPTRA DS) 800-160 MG tablet    Sig: Take 1 tablet by mouth 2 (two) times daily for 7 days.    Dispense:  14 tablet    Refill:  0    Order Specific Question:   Supervising Provider    Answer:   Wynona Luna [phone]  . ibuprofen (ADVIL,MOTRIN) tablet 800 mg   Keep covered to avoid friction Take antibiotic as prescribed and to completion Return in 48 hours to have packing removed and wound reevaluated Return or go to the ED if you have any new or worsening symptoms such as fever, chills, nausea, vomiting, increased swelling, pain, redness or swelling, etc...  Reviewed expectations re: course of current medical issues. Questions answered. Outlined signs and symptoms indicating need for more acute intervention. Patient verbalized understanding. After Visit Summary given.          Lestine Box, PA-C 11/22/17 2481

## 2017-11-26 ENCOUNTER — Encounter (HOSPITAL_COMMUNITY): Payer: Self-pay | Admitting: Emergency Medicine

## 2017-11-26 ENCOUNTER — Other Ambulatory Visit: Payer: Self-pay

## 2017-11-26 ENCOUNTER — Ambulatory Visit (HOSPITAL_COMMUNITY)
Admission: EM | Admit: 2017-11-26 | Discharge: 2017-11-26 | Disposition: A | Discharge status: Home / Self Care | Payer: Medicaid Other | Attending: Internal Medicine | Admitting: Internal Medicine

## 2017-11-26 DIAGNOSIS — L0501 Pilonidal cyst with abscess: Secondary | ICD-10-CM

## 2017-11-26 DIAGNOSIS — Z5189 Encounter for other specified aftercare: Secondary | ICD-10-CM

## 2017-11-26 NOTE — Discharge Instructions (Signed)
Continue antibiotic until completion Warm compresses Follow up if symptoms worsening

## 2017-11-26 NOTE — ED Triage Notes (Signed)
10/16 had I/d, packing placed.  Patient here today for packing removal.  Patient says she feels much better

## 2017-11-27 NOTE — ED Provider Notes (Signed)
[facility]    CSN: [id number] Arrival date & time: 11/26/17  1654     History   Chief Complaint No chief complaint on file.   HPI Emily Saunders is a 42 y.o. female history of HTN, tobacco use presenting today to have packing removed from abscess. Patient had pilonidal abscess drained 4 days ago on 10/16. She has been taking Bactrim and had significant improvement in her symptoms.   HPI  Past Medical History:  Diagnosis Date  . H/O pilonidal cyst 2002   . Hypertension   . PID (acute pelvic inflammatory disease) 2006   . Pneumonia   . Vitamin D deficiency 2015     Patient Active Problem List   Diagnosis Date Noted  . HTN (hypertension) 02/27/2015  . Unintended weight loss 10/27/2014  . Allergic rhinitis 06/23/2014  . Elevated BP 06/23/2014  . Vitamin D insufficiency 03/03/2014  . Pilonidal cyst 03/03/2014  . Broken teeth 03/03/2014  . Poor sleep pattern 03/03/2014  . Obesity (BMI 30-39.9) 03/03/2014  . Current smoker 03/03/2014  . Anemia 05/27/2013    Past Surgical History:  Procedure Laterality Date  . CESAREAN SECTION    . CYST EXCISION Right 2003   hand    OB History    Gravida  6   Para  4   Term  4   Preterm      AB  2   Living  4     SAB  1   TAB      Ectopic  1   Multiple  0   Live Births  4            Home Medications    Prior to Admission medications   Medication Sig Start Date End Date Taking? Authorizing Provider  amLODipine (NORVASC) 10 MG tablet Take 1 tablet (10 mg total) by mouth daily. 07/12/17   Clent Demark, PA-C  clindamycin (CLEOCIN) 300 MG capsule Take 1 capsule (300 mg total) by mouth 3 (three) times daily. 10/03/17   Shelly Bombard, MD  hydrochlorothiazide (HYDRODIURIL) 25 MG tablet Take 1 tablet (25 mg total) by mouth daily. 07/12/17   Clent Demark, PA-C  Prenatal Vit-Fe Fumarate-FA (PRENATAL VITAMIN PO) Take by mouth.    [provider]  sulfamethoxazole-trimethoprim  (BACTRIM DS,SEPTRA DS) 800-160 MG tablet Take 1 tablet by mouth 2 (two) times daily for 7 days. 11/22/17 11/29/17  Lestine Box, PA-C    Family History Family History  Problem Relation Age of Onset  . Hypertension Mother   . Diabetes Mother   . Asthma Mother   . COPD Mother   . Depression Mother   . Miscarriages / [place] Mother   . Vision loss Mother   . Heart disease Mother   . Parkinson's disease Father   . Diabetes Maternal Aunt   . Diabetes Maternal Uncle   . Diabetes Cousin     Social History Social History   Tobacco Use  . Smoking status: Current Some Day Smoker    Packs/day: 0.50    Years: 20.00    Pack years: 10.00    Types: Cigarettes  . Smokeless tobacco: Never Used  Substance Use Topics  . Alcohol use: No    Alcohol/week: 0.0 standard drinks  . Drug use: No     Allergies   Patient has no known allergies.   Review of Systems Review of Systems  Constitutional: Negative for fatigue and fever.  Eyes: Negative for visual disturbance.  Respiratory: Negative for shortness of breath.   Cardiovascular: Negative for chest pain.  Gastrointestinal: Negative for abdominal pain, nausea and vomiting.  Musculoskeletal: Negative for arthralgias and joint swelling.  Skin: Positive for wound. Negative for color change and rash.  Neurological: Negative for dizziness, weakness, light-headedness and headaches.     Physical Exam Triage Vital Signs ED Triage Vitals  Enc Vitals Group     BP 11/26/17 1730 132/89     Pulse Rate 11/26/17 1730 79     Resp 11/26/17 1730 16     Temp 11/26/17 1730 98.4 F (36.9 C)     Temp Source 11/26/17 1730 Oral     SpO2 11/26/17 1730 100 %     Weight --      Height --      Head Circumference --      Peak Flow --      Pain Score 11/26/17 1738 0     Pain Loc --      Pain Edu? --      Excl. in Derma? --    No data found.  Updated Vital Signs BP 132/89 (BP Location: Left Arm)   Pulse 79   Temp 98.4 F (36.9 C) (Oral)    Resp 16   SpO2 100%   Visual Acuity Right Eye Distance:   Left Eye Distance:   Bilateral Distance:    Right Eye Near:   Left Eye Near:    Bilateral Near:     Physical Exam  Constitutional: She is oriented to person, place, and time. She appears well-developed and well-nourished.  No acute distress  HENT:  Head: Normocephalic and atraumatic.  Nose: Nose normal.  Eyes: Conjunctivae are normal.  Neck: Neck supple.  Cardiovascular: Normal rate.  Pulmonary/Chest: Effort normal. No respiratory distress.  Abdominal: She exhibits no distension.  Genitourinary:  Genitourinary Comments: Well healing pilonidal abscess, incision almost closed, no packing in place, mild amount of induration around this area  Musculoskeletal: Normal range of motion.  Neurological: She is alert and oriented to person, place, and time.  Skin: Skin is warm and dry.  Psychiatric: She has a normal mood and affect.  Nursing note and vitals reviewed.    UC Treatments / Results  Labs (all labs ordered are listed, but only abnormal results are displayed) Labs Reviewed - No data to display  EKG None  Radiology No results found.  Procedures Procedures (including critical care time)  Medications Ordered in UC Medications - No data to display  Initial Impression / Assessment and Plan / UC Course  I have reviewed the triage vital signs and the nursing notes.  Pertinent labs & imaging results that were available during my care of the patient were reviewed by me and considered in my medical decision making (see chart for details).     Abscess healing, no packing, likely fell out on its own. Patient improving clinically. Continue to monitor, complete ABX. Discussed strict return precautions. Patient verbalized understanding and is agreeable with plan.  Final Clinical Impressions(s) / UC Diagnoses   Final diagnoses:  Wound check, abscess     Discharge Instructions     Continue antibiotic until  completion Warm compresses Follow up if symptoms worsening   ED Prescriptions    None     Controlled Substance Prescriptions [place] Controlled Substance Registry consulted? Not Applicable   Janith Lima, [place] 11/27/17 3536

## 2017-12-21 ENCOUNTER — Inpatient Hospital Stay (INDEPENDENT_AMBULATORY_CARE_PROVIDER_SITE_OTHER): Payer: Medicaid Other | Admitting: Physician Assistant

## 2018-01-03 ENCOUNTER — Encounter: Payer: Self-pay | Admitting: Obstetrics

## 2018-01-03 ENCOUNTER — Ambulatory Visit: Payer: Medicaid Other | Admitting: Obstetrics

## 2018-01-03 VITALS — BP 119/81 | HR 88 | Wt 220.2 lb

## 2018-01-03 DIAGNOSIS — F17210 Nicotine dependence, cigarettes, uncomplicated: Secondary | ICD-10-CM

## 2018-01-03 DIAGNOSIS — Z3169 Encounter for other general counseling and advice on procreation: Secondary | ICD-10-CM

## 2018-01-03 MED ORDER — VARENICLINE TARTRATE 1 MG PO TABS: 1.0000 mg | ORAL_TABLET | Freq: Two times a day (BID) | ORAL | 1 refills | Status: DC

## 2018-01-03 MED ORDER — VARENICLINE TARTRATE 0.5 MG X 11 & 1 MG X 42 PO MISC: ORAL | 0 refills | Status: DC

## 2018-01-03 MED ORDER — VITAFOL ULTRA 29-0.6-0.4-200 MG PO CAPS: 1.0000 | ORAL_CAPSULE | Freq: Every day | ORAL | 11 refills | Status: DC

## 2018-01-03 NOTE — Progress Notes (Signed)
Pt presents to f/u trying to conceive.

## 2018-01-03 NOTE — Progress Notes (Signed)
Patient ID: Emily Saunders, female   DOB: 11/08/1975, 42 y.o.   MRN: [id number]  Chief Complaint  Patient presents with  . TRYING TO CONCEIVE    HPI Emily Saunders is a 42 y.o. female.  Trying to conceive since removal of IUD ~ 3 months ago. HPI  Past Medical History:  Diagnosis Date  . H/O pilonidal cyst 2002   . Hypertension   . PID (acute pelvic inflammatory disease) 2006   . Pneumonia   . Vitamin D deficiency 2015     Past Surgical History:  Procedure Laterality Date  . CESAREAN SECTION    . CYST EXCISION Right 2003   hand    Family History  Problem Relation Age of Onset  . Hypertension Mother   . Diabetes Mother   . Asthma Mother   . COPD Mother   . Depression Mother   . Miscarriages / [place] Mother   . Vision loss Mother   . Heart disease Mother   . Parkinson's disease Father   . Diabetes Maternal Aunt   . Diabetes Maternal Uncle   . Diabetes Cousin     Social History Social History   Tobacco Use  . Smoking status: Current Some Day Smoker    Packs/day: 0.50    Years: 20.00    Pack years: 10.00    Types: Cigarettes  . Smokeless tobacco: Never Used  Substance Use Topics  . Alcohol use: No    Alcohol/week: 0.0 standard drinks  . Drug use: No    No Known Allergies  Current Outpatient Medications  Medication Sig Dispense Refill  . amLODipine (NORVASC) 10 MG tablet Take 1 tablet (10 mg total) by mouth daily. 30 tablet 11  . hydrochlorothiazide (HYDRODIURIL) 25 MG tablet Take 1 tablet (25 mg total) by mouth daily. 30 tablet 11  . Prenatal Vit-Fe Fumarate-FA (PRENATAL VITAMIN PO) Take by mouth.    . clindamycin (CLEOCIN) 300 MG capsule Take 1 capsule (300 mg total) by mouth 3 (three) times daily. (Patient not taking: Reported on 01/03/2018) 30 capsule 4  . Prenat-Fe Poly-Methfol-FA-DHA (VITAFOL ULTRA) 29-0.6-0.4-200 MG CAPS Take 1 capsule by mouth daily before breakfast. 30 capsule 11  . varenicline (CHANTIX CONTINUING MONTH PAK) 1 MG tablet Take  1 tablet (1 mg total) by mouth 2 (two) times daily. 60 tablet 1  . varenicline (CHANTIX STARTING MONTH PAK) 0.5 MG X 11 & 1 MG X 42 tablet Take one 0.5 mg tablet by mouth once daily for 3 days, then increase to one 0.5 mg tablet twice daily for 4 days, then increase to one 1 mg tablet twice daily. 53 tablet 0   No current facility-administered medications for this visit.     Review of Systems Review of Systems Constitutional: negative for fatigue and weight loss Respiratory: negative for cough and wheezing Cardiovascular: negative for chest pain, fatigue and palpitations Gastrointestinal: negative for abdominal pain and change in bowel habits Genitourinary:negative Integument/breast: negative for nipple discharge Musculoskeletal:negative for myalgias Neurological: negative for gait problems and tremors Behavioral/Psych: negative for abusive relationship, depression Endocrine: negative for temperature intolerance      Blood pressure 119/81, pulse 88, weight 220 lb 3.2 oz (99.9 kg), last menstrual period 12/17/2017.  Physical Exam Physical Exam:  Deferred  >50% of 15 min visit spent on counseling and coordination of care.   Data Reviewed Office notes  Assessment     1. Encounter for preconception consultation Rx: - Prenat-Fe Poly-Methfol-FA-DHA (VITAFOL ULTRA) 29-0.6-0.4-200 MG CAPS; Take 1  capsule by mouth daily before breakfast.  Dispense: 30 capsule; Refill: 11 - schedule intercourse midcycle   2. Tobacco dependence due to cigarettes Rx: - varenicline (CHANTIX STARTING MONTH PAK) 0.5 MG X 11 & 1 MG X 42 tablet; Take one 0.5 mg tablet by mouth once daily for 3 days, then increase to one 0.5 mg tablet twice daily for 4 days, then increase to one 1 mg tablet twice daily.  Dispense: 53 tablet; Refill: 0 - varenicline (CHANTIX CONTINUING MONTH PAK) 1 MG tablet; Take 1 tablet (1 mg total) by mouth 2 (two) times daily.  Dispense: 60 tablet; Refill: 1   Plan    Follow up in 3  months.  Consider Clomid.  No orders of the defined types were placed in this encounter.  Meds ordered this encounter  Medications  . varenicline (CHANTIX STARTING MONTH PAK) 0.5 MG X 11 & 1 MG X 42 tablet    Sig: Take one 0.5 mg tablet by mouth once daily for 3 days, then increase to one 0.5 mg tablet twice daily for 4 days, then increase to one 1 mg tablet twice daily.    Dispense:  53 tablet    Refill:  0  . varenicline (CHANTIX CONTINUING MONTH PAK) 1 MG tablet    Sig: Take 1 tablet (1 mg total) by mouth 2 (two) times daily.    Dispense:  60 tablet    Refill:  1  . Prenat-Fe Poly-Methfol-FA-DHA (VITAFOL ULTRA) 29-0.6-0.4-200 MG CAPS    Sig: Take 1 capsule by mouth daily before breakfast.    Dispense:  30 capsule    Refill:  11      A.  MD 01-03-2018

## 2018-01-11 ENCOUNTER — Inpatient Hospital Stay (INDEPENDENT_AMBULATORY_CARE_PROVIDER_SITE_OTHER): Payer: Medicaid Other | Admitting: Physician Assistant

## 2018-01-29 ENCOUNTER — Inpatient Hospital Stay (INDEPENDENT_AMBULATORY_CARE_PROVIDER_SITE_OTHER): Payer: Medicaid Other | Admitting: Nurse Practitioner

## 2018-02-08 ENCOUNTER — Encounter (INDEPENDENT_AMBULATORY_CARE_PROVIDER_SITE_OTHER): Payer: Self-pay | Admitting: Physician Assistant

## 2018-02-08 ENCOUNTER — Other Ambulatory Visit: Payer: Self-pay

## 2018-02-08 ENCOUNTER — Ambulatory Visit (INDEPENDENT_AMBULATORY_CARE_PROVIDER_SITE_OTHER): Payer: Medicaid Other | Admitting: Physician Assistant

## 2018-02-08 VITALS — BP 123/88 | HR 78 | Temp 98.3°F | Ht 67.0 in | Wt 215.2 lb

## 2018-02-08 DIAGNOSIS — F17210 Nicotine dependence, cigarettes, uncomplicated: Secondary | ICD-10-CM

## 2018-02-08 DIAGNOSIS — S90852A Superficial foreign body, left foot, initial encounter: Secondary | ICD-10-CM

## 2018-02-08 DIAGNOSIS — R2242 Localized swelling, mass and lump, left lower limb: Secondary | ICD-10-CM

## 2018-02-08 DIAGNOSIS — I10 Essential (primary) hypertension: Secondary | ICD-10-CM

## 2018-02-08 MED ORDER — HYDROCHLOROTHIAZIDE 25 MG PO TABS: 25.0000 mg | ORAL_TABLET | Freq: Every day | ORAL | 1 refills | Status: DC

## 2018-02-08 MED ORDER — VARENICLINE TARTRATE 1 MG PO TABS: 1.0000 mg | ORAL_TABLET | Freq: Two times a day (BID) | ORAL | 1 refills | Status: DC

## 2018-02-08 MED ORDER — AMLODIPINE BESYLATE 10 MG PO TABS: 10.0000 mg | ORAL_TABLET | Freq: Every day | ORAL | 1 refills | Status: DC

## 2018-02-08 NOTE — Patient Instructions (Signed)
Tobacco Use Disorder Tobacco use disorder (TUD) occurs when a person craves, seeks, and uses tobacco, regardless of the consequences. This disorder can cause problems with mental and physical health. It can affect your ability to have healthy relationships, and it can keep you from meeting your responsibilities at work, home, or school. Tobacco may be:  Smoked as a cigarette or cigar.  Inhaled using e-cigarettes.  Smoked in a pipe or hookah.  Chewed as smokeless tobacco.  Inhaled into the nostrils as snuff. Tobacco products contain a dangerous chemical called nicotine, which is very addictive. Nicotine triggers hormones that make the body feel stimulated and works on areas of the brain that make you feel good. These effects can make it hard for people to quit nicotine. Tobacco contains many other unsafe chemicals that can damage almost every organ in the body. Smoking tobacco also puts others in danger due to fire risk and possible health problems caused by breathing in secondhand smoke. What are the signs or symptoms? Symptoms of TUD may include:  Being unable to slow down or stop your tobacco use.  Spending an abnormal amount of time getting or using tobacco.  Craving tobacco. Cravings may last for up to 6 months after quitting.  Tobacco use that: ? Interferes with your work, school, or home life. ? Interferes with your personal and social relationships. ? Makes you give up activities that you once enjoyed or found important.  Using tobacco even though you know that it is: ? Dangerous or bad for your health or someone else's health. ? Causing problems in your life.  Needing more and more of the substance to get the same effect (developing tolerance).  Experiencing unpleasant symptoms if you do not use the substance (withdrawal). Withdrawal symptoms may include: ? Depressed, anxious, or irritable mood. ? Difficulty concentrating. ? Increased appetite. ? Restlessness or trouble  sleeping.  Using the substance to avoid withdrawal. How is this diagnosed? This condition may be diagnosed based on:  Your current and past tobacco use. Your health care provider may ask questions about how your tobacco use affects your life.  A physical exam. You may be diagnosed with TUD if you have at least two symptoms within a 12-month period. How is this treated? This condition is treated by stopping tobacco use. Many people are unable to quit on their own and need help. Treatment may include:  Nicotine replacement therapy (NRT). NRT provides nicotine without the other harmful chemicals in tobacco. NRT gradually lowers the dosage of nicotine in the body and reduces withdrawal symptoms. NRT is available as: ? Over-the-counter gums, lozenges, and skin patches. ? Prescription mouth inhalers and nasal sprays.  Medicine that acts on the brain to reduce cravings and withdrawal symptoms.  A type of talk therapy that examines your triggers for tobacco use, how to avoid them, and how to cope with cravings (behavioral therapy).  Hypnosis. This may help with withdrawal symptoms.  Joining a support group for others coping with TUD. The best treatment for TUD is usually a combination of medicine, talk therapy, and support groups. Recovery can be a long process. Many people start using tobacco again after stopping (relapse). If you relapse, it does not mean that treatment will not work. Follow these instructions at home:  Lifestyle  Do not use any products that contain nicotine or tobacco, such as cigarettes and e-cigarettes.  Avoid things that trigger tobacco use as much as you can. Triggers include people and situations that usually cause you   to use tobacco.  Avoid drinks that contain caffeine, including coffee. These may worsen some withdrawal symptoms.  Find ways to manage stress. Wanting to smoke may cause stress, and stress can make you want to smoke. Relaxation techniques such as  deep breathing, meditation, and yoga may help.  Attend support groups as needed. These groups are an important part of long-term recovery for many people. General instructions  Take over-the-counter and prescription medicines only as told by your health care provider.  Check with your health care provider before taking any new prescription or over-the-counter medicines.  Decide on a friend, family member, or smoking quit-line (such as 1-800-QUIT-NOW in the [place] that you can call or text when you feel the urge to smoke or when you need help coping with cravings.  Keep all follow-up visits as told by your health care provider and therapist. This is important. Contact a health care provider if:  You are not able to take your medicines as prescribed.  Your symptoms get worse, even with treatment. Summary  Tobacco use disorder (TUD) occurs when a person craves, seeks, and uses tobacco regardless of the consequences.  This condition may be diagnosed based on your current and past tobacco use and a physical exam.  Many people are unable to quit on their own and need help. Recovery can be a long process.  The most effective treatment for TUD is usually a combination of medicine, talk therapy, and support groups. This information is not intended to replace advice given to you by your health care provider. Make sure you discuss any questions you have with your health care provider. Document Released: 09/30/2003 Document Revised: 01/11/2017 Document Reviewed: 01/11/2017 Elsevier Interactive Patient Education  2019 [employer]  

## 2018-02-08 NOTE — Progress Notes (Signed)
Subjective:  Patient ID: Emily Saunders, female    DOB: [birth year]  Age: 43 y.o. MRN: [id number]  CC: f/u HTN  HPI Emily Saunders a 43 y.o.femalewith a medical history of HTN, PID, PNA, pilonidal cyst, and vit D deficiency presents for HTN f/u. Last BP 130/88 mmHg. Had not been taking HCTZ at the time because she thought it may interact with Amlodipine. Advised to take HCTZ and amlodipine as directed. BP 123/88 mmHg today. Does not exercise regularly. Tries to abstain from adding salt to foods. Denies CP, palpitations, SOB, HA, tingling, numbness, abdominal pain, f/c/n/v, rash, swelling, or GI/GU sxs.    Outpatient Medications Prior to Visit  Medication Sig Dispense Refill  . amLODipine (NORVASC) 10 MG tablet Take 1 tablet (10 mg total) by mouth daily. 30 tablet 11  . hydrochlorothiazide (HYDRODIURIL) 25 MG tablet Take 1 tablet (25 mg total) by mouth daily. 30 tablet 11  . Prenat-Fe Poly-Methfol-FA-DHA (VITAFOL ULTRA) 29-0.6-0.4-200 MG CAPS Take 1 capsule by mouth daily before breakfast. 30 capsule 11  . varenicline (CHANTIX CONTINUING MONTH PAK) 1 MG tablet Take 1 tablet (1 mg total) by mouth 2 (two) times daily. 60 tablet 1  . clindamycin (CLEOCIN) 300 MG capsule Take 1 capsule (300 mg total) by mouth 3 (three) times daily. (Patient not taking: Reported on 01/03/2018) 30 capsule 4  . Prenatal Vit-Fe Fumarate-FA (PRENATAL VITAMIN PO) Take by mouth.    . varenicline (CHANTIX STARTING MONTH PAK) 0.5 MG X 11 & 1 MG X 42 tablet Take one 0.5 mg tablet by mouth once daily for 3 days, then increase to one 0.5 mg tablet twice daily for 4 days, then increase to one 1 mg tablet twice daily. 53 tablet 0   No facility-administered medications prior to visit.      ROS Review of Systems  Constitutional: Negative for chills, fever and malaise/fatigue.  Eyes: Negative for blurred vision.  Respiratory: Negative for shortness of breath.   Cardiovascular: Negative for chest pain and palpitations.   Gastrointestinal: Negative for abdominal pain and nausea.  Genitourinary: Negative for dysuria and hematuria.  Musculoskeletal: Negative for joint pain and myalgias.  Skin: Negative for rash.  Neurological: Negative for tingling and headaches.  Psychiatric/Behavioral: Negative for depression. The patient is not nervous/anxious.     Objective:  BP 123/88 (BP Location: Right Arm, Patient Position: Sitting, Cuff Size: Large)   Pulse 78   Temp 98.3 F (36.8 C) (Oral)   Ht 5\' 7"  (1.702 m)   Wt 215 lb 3.2 oz (97.6 kg)   LMP 02/02/2018 (Exact Date)   SpO2 98%   BMI 33.71 kg/m   BP/Weight 02/08/2018 01/03/2018 07/37/1062  Systolic BP 694 854 627  Diastolic BP 88 81 89  Wt. (Lbs) 215.2 220.2 -  BMI 33.71 34.49 -      Physical Exam Vitals signs reviewed.  Constitutional:      Comments: Well developed, well nourished, NAD, polite  HENT:     Head: Normocephalic and atraumatic.  Eyes:     General: No scleral icterus. Neck:     Musculoskeletal: Normal range of motion and neck supple.     Thyroid: No thyromegaly.  Cardiovascular:     Rate and Rhythm: Normal rate and regular rhythm.     Heart sounds: Normal heart sounds.  Pulmonary:     Effort: Pulmonary effort is normal.     Breath sounds: Normal breath sounds.  Musculoskeletal:     Comments: Left heel with a small  subdermal nodule/foreign body with poorly defined borders  Skin:    General: Skin is warm and dry.     Coloration: Skin is not pale.     Findings: No erythema or rash.  Neurological:     Mental Status: She is alert and oriented to person, place, and time.  Psychiatric:        Behavior: Behavior normal.        Thought Content: Thought content normal.      Assessment & Plan:    1. Hypertension, unspecified type - Well controlled - Refill hydrochlorothiazide (HYDRODIURIL) 25 MG tablet; Take 1 tablet (25 mg total) by mouth daily.  Dispense: 90 tablet; Refill: 1 - Refill amLODipine (NORVASC) 10 MG tablet;  Take 1 tablet (10 mg total) by mouth daily.  Dispense: 90 tablet; Refill: 1 - Basic Metabolic Panel  2. Tobacco dependence due to cigarettes - Refill varenicline (CHANTIX CONTINUING MONTH PAK) 1 MG tablet; Take 1 tablet (1 mg total) by mouth 2 (two) times daily.  Dispense: 60 tablet; Refill: 1  3. Foreign body in left foot, initial encounter - DG Foot Complete Left; Future - will send to surgeon if a foreign body detected on XR.    Meds ordered this encounter  Medications  . hydrochlorothiazide (HYDRODIURIL) 25 MG tablet    Sig: Take 1 tablet (25 mg total) by mouth daily.    Dispense:  90 tablet    Refill:  1    Order Specific Question:   Supervising Provider    Answer:   Charlott Rakes [[id number]]  . amLODipine (NORVASC) 10 MG tablet    Sig: Take 1 tablet (10 mg total) by mouth daily.    Dispense:  90 tablet    Refill:  1    Order Specific Question:   Supervising Provider    Answer:   Charlott Rakes [[id number]]  . varenicline (CHANTIX CONTINUING MONTH PAK) 1 MG tablet    Sig: Take 1 tablet (1 mg total) by mouth 2 (two) times daily.    Dispense:  60 tablet    Refill:  1    Order Specific Question:   Supervising Provider    Answer:   Charlott Rakes [[id number]]    Follow-up:  6 months for annual physical and pap.  Clent Demark PA

## 2018-02-09 ENCOUNTER — Telehealth (INDEPENDENT_AMBULATORY_CARE_PROVIDER_SITE_OTHER): Payer: Self-pay

## 2018-02-09 ENCOUNTER — Ambulatory Visit (HOSPITAL_COMMUNITY)
Admission: RE | Admit: 2018-02-09 | Discharge: 2018-02-09 | Disposition: A | Discharge status: Home / Self Care | Payer: Medicaid Other | Source: Ambulatory Visit | Attending: Physician Assistant | Admitting: Physician Assistant

## 2018-02-09 DIAGNOSIS — S90852A Superficial foreign body, left foot, initial encounter: Secondary | ICD-10-CM | POA: Diagnosis not present

## 2018-02-09 DIAGNOSIS — M19072 Primary osteoarthritis, left ankle and foot: Secondary | ICD-10-CM | POA: Diagnosis not present

## 2018-02-09 LAB — BASIC METABOLIC PANEL
BUN / CREAT RATIO: 9 (ref 9–23)
BUN: 10 mg/dL (ref 6–24)
CO2: 24 mmol/L (ref 20–29)
Calcium: 9.6 mg/dL (ref 8.7–10.2)
Chloride: 98 mmol/L (ref 96–106)
Creatinine, Ser: 1.06 mg/dL — ABNORMAL HIGH (ref 0.57–1.00)
GFR calc Af Amer: 75 mL/min/{1.73_m2} (ref >59)
GFR calc non Af Amer: 65 mL/min/{1.73_m2} (ref >59)
GLUCOSE: 82 mg/dL (ref 65–99)
Potassium: 4 mmol/L (ref 3.5–5.2)
Sodium: 139 mmol/L (ref 134–144)

## 2018-02-09 NOTE — Telephone Encounter (Signed)
-----   Message from Clent Demark, PA-C sent at 02/09/2018  8:32 AM EST ----- Seems she may be mildly dehydrated. Otherwise normal labs.

## 2018-02-09 NOTE — Telephone Encounter (Signed)
Patient is aware that she may be mildly dehydrated. Advised patient to drink more water. Otherwise labs normal. Nat Christen, CMA

## 2018-02-12 ENCOUNTER — Telehealth (INDEPENDENT_AMBULATORY_CARE_PROVIDER_SITE_OTHER): Payer: Self-pay

## 2018-02-12 NOTE — Telephone Encounter (Signed)
-----   Message from Clent Demark, PA-C sent at 02/10/2018  9:50 AM EST ----- Xray does not show a radioopaque foreign body. She may need to have skin shaved down to release the "object" she feels. I can try to do this but I will need a scalpel ([facility] may have one). Please schedule her for Tuesday morning.

## 2018-02-12 NOTE — Telephone Encounter (Signed)
Patient is aware that Xray did not show a radioopaque foreign body. Patient scheduled for 02/13/2018 at 10:50 to have skin shaved down to possibly release the "object " she feels. Nat Christen, CMA

## 2018-02-13 ENCOUNTER — Ambulatory Visit (INDEPENDENT_AMBULATORY_CARE_PROVIDER_SITE_OTHER): Payer: Medicaid Other | Admitting: Physician Assistant

## 2018-02-13 ENCOUNTER — Encounter (INDEPENDENT_AMBULATORY_CARE_PROVIDER_SITE_OTHER): Payer: Self-pay | Admitting: Physician Assistant

## 2018-02-13 ENCOUNTER — Other Ambulatory Visit: Payer: Self-pay

## 2018-02-13 VITALS — BP 126/86 | HR 92 | Temp 98.7°F | Ht 67.0 in | Wt 215.6 lb

## 2018-02-13 DIAGNOSIS — S90852A Superficial foreign body, left foot, initial encounter: Secondary | ICD-10-CM | POA: Diagnosis not present

## 2018-02-13 NOTE — Progress Notes (Addendum)
   Subjective:  Patient ID: Emily Saunders, female    DOB: 1975/11/24  Age: 43 y.o. MRN: [id number]  CC: foreign body in heel  HPI Katherinne A Dearmonis a 43 y.o.femalewith a medical history of HTN, PID, PNA, pilonidal cyst, and vit D deficiency presentsfor removal of foreign body in the left heel. States she can feel the foreign body only when walking on her carpet. There is also a "black dot" seen through the skin in the area of pain. Can feel the pain "deeper" in the heel. Had a recent XR to try to detect foreign body but none was detected. Requesting removal of foreign body. Does not endorse any other associated symptoms.     Outpatient Medications Prior to Visit  Medication Sig Dispense Refill  . amLODipine (NORVASC) 10 MG tablet Take 1 tablet (10 mg total) by mouth daily. 90 tablet 1  . hydrochlorothiazide (HYDRODIURIL) 25 MG tablet Take 1 tablet (25 mg total) by mouth daily. 90 tablet 1  . Prenat-Fe Poly-Methfol-FA-DHA (VITAFOL ULTRA) 29-0.6-0.4-200 MG CAPS Take 1 capsule by mouth daily before breakfast. 30 capsule 11  . varenicline (CHANTIX CONTINUING MONTH PAK) 1 MG tablet Take 1 tablet (1 mg total) by mouth 2 (two) times daily. 60 tablet 1   No facility-administered medications prior to visit.      ROS Review of Systems  Skin:       Small black spot in left heel    Objective:  BP 126/86 (BP Location: Right Arm, Patient Position: Sitting, Cuff Size: Large)   Pulse 92   Temp 98.7 F (37.1 C) (Oral)   Ht 5\' 7"  (1.702 m)   Wt 215 lb 9.6 oz (97.8 kg)   LMP 02/02/2018 (Exact Date)   SpO2 96%   BMI 33.77 kg/m   BP/Weight 02/13/2018 02/08/2018 18/56/3149  Systolic BP 702 637 858  Diastolic BP 86 88 81  Wt. (Lbs) 215.6 215.2 220.2  BMI 33.77 33.71 34.49      Physical Exam Vitals signs reviewed.  Constitutional:      Appearance: Normal appearance.     Comments: Well developed, well nourished, NAD, polite  Neck:     Thyroid: No thyromegaly.  Pulmonary:     Effort:  Pulmonary effort is normal.  Skin:    Coloration: Skin is not pale.     Comments: Left heel with a tiny, circular, black lesion/object nearly in the center of the heel at the sole. Small amount of excess dry skin overlying the lesion/object. Lesion/object is palpable.   Neurological:     Mental Status: She is alert and oriented to person, place, and time.  Psychiatric:        Behavior: Behavior normal.        Thought Content: Thought content normal.      Assessment & Plan:    1. Foreign body in heel, left, initial encounter - Site prepped in sterile fashion. 0.5 cc of 2% lidocaine injected into heel directly under tiny black spot in heel. Tiny black object appearing as a splinter measuring approximately 1 mm was removed after shaving and excising with a 15 blade. Minimal bleeding after removal of object. Bleeding controlled with pressure alone. No complications and patient left under her own power.   Xylocaine 2%  Lot [id number] Exp 11/21 [facility] [phone]  Follow-up: PRN  Clent Demark PA

## 2018-02-27 ENCOUNTER — Inpatient Hospital Stay (INDEPENDENT_AMBULATORY_CARE_PROVIDER_SITE_OTHER): Payer: Medicaid Other | Admitting: Family Medicine

## 2018-02-27 ENCOUNTER — Encounter (INDEPENDENT_AMBULATORY_CARE_PROVIDER_SITE_OTHER): Payer: Self-pay

## 2018-03-22 DIAGNOSIS — Z13220 Encounter for screening for lipoid disorders: Secondary | ICD-10-CM | POA: Diagnosis not present

## 2018-03-22 DIAGNOSIS — R5383 Other fatigue: Secondary | ICD-10-CM | POA: Diagnosis not present

## 2018-03-22 DIAGNOSIS — D538 Other specified nutritional anemias: Secondary | ICD-10-CM | POA: Diagnosis not present

## 2018-03-22 DIAGNOSIS — Z131 Encounter for screening for diabetes mellitus: Secondary | ICD-10-CM | POA: Diagnosis not present

## 2018-03-22 DIAGNOSIS — E559 Vitamin D deficiency, unspecified: Secondary | ICD-10-CM | POA: Diagnosis not present

## 2018-04-05 ENCOUNTER — Ambulatory Visit: Payer: Medicaid Other | Admitting: Obstetrics

## 2018-04-05 ENCOUNTER — Encounter: Payer: Self-pay | Admitting: Obstetrics

## 2018-04-05 VITALS — BP 120/73 | HR 83 | Wt 214.0 lb

## 2018-04-05 DIAGNOSIS — Z3169 Encounter for other general counseling and advice on procreation: Secondary | ICD-10-CM

## 2018-04-05 NOTE — Progress Notes (Signed)
Patient ID: Emily Saunders, female   DOB: 07/01/1975, 43 y.o.   MRN: [id number]  Chief Complaint  Patient presents with  . Follow-up    HPI Emily Saunders is a 43 y.o. female.  Trying to conceive for the past 6 months. HPI  Past Medical History:  Diagnosis Date  . H/O pilonidal cyst 2002   . Hypertension   . PID (acute pelvic inflammatory disease) 2006   . Pneumonia   . Vitamin D deficiency 2015     Past Surgical History:  Procedure Laterality Date  . CESAREAN SECTION    . CYST EXCISION Right 2003   hand    Family History  Problem Relation Age of Onset  . Hypertension Mother   . Diabetes Mother   . Asthma Mother   . COPD Mother   . Depression Mother   . Miscarriages / [place] Mother   . Vision loss Mother   . Heart disease Mother   . Parkinson's disease Father   . Diabetes Maternal Aunt   . Diabetes Maternal Uncle   . Diabetes Cousin     Social History Social History   Tobacco Use  . Smoking status: Current Some Day Smoker    Packs/day: 0.50    Years: 20.00    Pack years: 10.00    Types: Cigarettes  . Smokeless tobacco: Never Used  Substance Use Topics  . Alcohol use: No    Alcohol/week: 0.0 standard drinks  . Drug use: No    No Known Allergies  Current Outpatient Medications  Medication Sig Dispense Refill  . amLODipine (NORVASC) 10 MG tablet Take 1 tablet (10 mg total) by mouth daily. 90 tablet 1  . hydrochlorothiazide (HYDRODIURIL) 25 MG tablet Take 1 tablet (25 mg total) by mouth daily. 90 tablet 1  . Prenat-Fe Poly-Methfol-FA-DHA (VITAFOL ULTRA) 29-0.6-0.4-200 MG CAPS Take 1 capsule by mouth daily before breakfast. 30 capsule 11  . varenicline (CHANTIX CONTINUING MONTH PAK) 1 MG tablet Take 1 tablet (1 mg total) by mouth 2 (two) times daily. (Patient not taking: Reported on 04/05/2018) 60 tablet 1   No current facility-administered medications for this visit.     Review of Systems Review of Systems Constitutional: negative for fatigue  and weight loss Respiratory: negative for cough and wheezing Cardiovascular: negative for chest pain, fatigue and palpitations Gastrointestinal: negative for abdominal pain and change in bowel habits Genitourinary:negative Integument/breast: negative for nipple discharge Musculoskeletal:negative for myalgias Neurological: negative for gait problems and tremors Behavioral/Psych: negative for abusive relationship, depression Endocrine: negative for temperature intolerance      Blood pressure 120/73, pulse 83, weight 214 lb (97.1 kg), last menstrual period 03/21/2018.  Physical Exam Physical Exam:  Deferred  >50% of 15 min visit spent on counseling and coordination of care.   Data Reviewed Labs  Assessment     1. Encounter for preconception consultation - continue timed intercourse - continue Folic Acid    Plan     Follow up in 4 months.  Will consider Clomid if no pregnancy in 4 months.  No orders of the defined types were placed in this encounter.  No orders of the defined types were placed in this encounter.   Shelly Bombard MD 04-05-2018

## 2018-04-05 NOTE — Progress Notes (Signed)
Pt is in the office for 3 month follow up related to fertility issues. LMP 03-21-18

## 2018-04-10 DIAGNOSIS — A4902 Methicillin resistant Staphylococcus aureus infection, unspecified site: Secondary | ICD-10-CM | POA: Diagnosis not present

## 2018-04-10 DIAGNOSIS — N6459 Other signs and symptoms in breast: Secondary | ICD-10-CM | POA: Diagnosis not present

## 2018-04-10 DIAGNOSIS — S21002A Unspecified open wound of left breast, initial encounter: Secondary | ICD-10-CM | POA: Diagnosis not present

## 2018-04-10 DIAGNOSIS — Z1231 Encounter for screening mammogram for malignant neoplasm of breast: Secondary | ICD-10-CM | POA: Diagnosis not present

## 2018-04-11 ENCOUNTER — Other Ambulatory Visit: Payer: Self-pay | Admitting: Family

## 2018-04-11 DIAGNOSIS — S21009A Unspecified open wound of unspecified breast, initial encounter: Secondary | ICD-10-CM

## 2018-04-17 ENCOUNTER — Ambulatory Visit
Admission: RE | Admit: 2018-04-17 | Discharge: 2018-04-17 | Disposition: A | Discharge status: Home / Self Care | Payer: Medicaid Other | Source: Ambulatory Visit | Attending: Family | Admitting: Family

## 2018-04-17 DIAGNOSIS — R928 Other abnormal and inconclusive findings on diagnostic imaging of breast: Secondary | ICD-10-CM | POA: Diagnosis not present

## 2018-04-17 DIAGNOSIS — S21009A Unspecified open wound of unspecified breast, initial encounter: Secondary | ICD-10-CM

## 2018-04-17 DIAGNOSIS — N6489 Other specified disorders of breast: Secondary | ICD-10-CM | POA: Diagnosis not present

## 2018-04-19 ENCOUNTER — Other Ambulatory Visit (HOSPITAL_COMMUNITY)
Admission: RE | Admit: 2018-04-19 | Discharge: 2018-04-19 | Disposition: A | Discharge status: Home / Self Care | Payer: Medicaid Other | Source: Ambulatory Visit | Attending: Obstetrics | Admitting: Obstetrics

## 2018-04-19 ENCOUNTER — Encounter: Payer: Self-pay | Admitting: Obstetrics

## 2018-04-19 ENCOUNTER — Ambulatory Visit: Payer: Medicaid Other | Admitting: Obstetrics

## 2018-04-19 ENCOUNTER — Other Ambulatory Visit: Payer: Self-pay

## 2018-04-19 VITALS — BP 131/91 | HR 89 | Ht 66.0 in | Wt 214.0 lb

## 2018-04-19 DIAGNOSIS — N898 Other specified noninflammatory disorders of vagina: Secondary | ICD-10-CM

## 2018-04-19 DIAGNOSIS — T192XXA Foreign body in vulva and vagina, initial encounter: Secondary | ICD-10-CM

## 2018-04-19 NOTE — Progress Notes (Signed)
Patient ID: Emily Saunders, female   DOB: March 19, 1975, 43 y.o.   MRN: [id number]  Chief Complaint  Patient presents with  . Gynecologic Exam    Stuck Tampon    HPI Emily Saunders is a 43 y.o. female.  Feels that a tampon is retained and can't be reached. HPI  Past Medical History:  Diagnosis Date  . H/O pilonidal cyst 2002   . Hypertension   . PID (acute pelvic inflammatory disease) 2006   . Pneumonia   . Vitamin D deficiency 2015     Past Surgical History:  Procedure Laterality Date  . CESAREAN SECTION    . CYST EXCISION Right 2003   hand    Family History  Problem Relation Age of Onset  . Hypertension Mother   . Diabetes Mother   . Asthma Mother   . COPD Mother   . Depression Mother   . Miscarriages / [place] Mother   . Vision loss Mother   . Heart disease Mother   . Parkinson's disease Father   . Diabetes Maternal Aunt   . Diabetes Maternal Uncle   . Diabetes Cousin   . Breast cancer Maternal Grandmother     Social History Social History   Tobacco Use  . Smoking status: Current Some Day Smoker    Packs/day: 0.50    Years: 20.00    Pack years: 10.00    Types: Cigarettes  . Smokeless tobacco: Never Used  Substance Use Topics  . Alcohol use: No    Alcohol/week: 0.0 standard drinks  . Drug use: No    No Known Allergies  Current Outpatient Medications  Medication Sig Dispense Refill  . amLODipine (NORVASC) 10 MG tablet Take 1 tablet (10 mg total) by mouth daily. 90 tablet 1  . hydrochlorothiazide (HYDRODIURIL) 25 MG tablet Take 1 tablet (25 mg total) by mouth daily. 90 tablet 1  . Prenat-Fe Poly-Methfol-FA-DHA (VITAFOL ULTRA) 29-0.6-0.4-200 MG CAPS Take 1 capsule by mouth daily before breakfast. 30 capsule 11  . varenicline (CHANTIX CONTINUING MONTH PAK) 1 MG tablet Take 1 tablet (1 mg total) by mouth 2 (two) times daily. (Patient not taking: Reported on 04/05/2018) 60 tablet 1   No current facility-administered medications for this visit.      Review of Systems Review of Systems Constitutional: negative for fatigue and weight loss Respiratory: negative for cough and wheezing Cardiovascular: negative for chest pain, fatigue and palpitations Gastrointestinal: negative for abdominal pain and change in bowel habits Genitourinary:negative Integument/breast: negative for nipple discharge Musculoskeletal:negative for myalgias Neurological: negative for gait problems and tremors Behavioral/Psych: negative for abusive relationship, depression Endocrine: negative for temperature intolerance      Blood pressure (!) 131/91, pulse 89, height 5\' 6"  (1.676 m), weight 214 lb (97.1 kg), last menstrual period 04/14/2018.  Physical Exam Physical Exam           General:  Alert and no distress Abdomen:  normal findings: no organomegaly, soft, non-tender and no hernia  Pelvis:  External genitalia: normal general appearance Urinary system: urethral meatus normal and bladder without fullness, nontender Vaginal: normal without tenderness, induration or masses.  No tampon observed. Cervix: normal appearance Adnexa: normal bimanual exam Uterus: anteverted and non-tender, normal size    50% of 15 min visit spent on counseling and coordination of care.   Data Reviewed Wet Prep and Cultures  Assessment     1. Retained tampon, initial encounter  2. Vaginal discharge Rx: - Cervicovaginal ancillary only( [facility])  Plan    Follow up for Annual  No orders of the defined types were placed in this encounter.  No orders of the defined types were placed in this encounter.   Shelly Bombard MD 04-19-2018

## 2018-04-19 NOTE — Progress Notes (Signed)
Presents for Vaginal obstruction, she thinks that her Tampon is stuck x 4 days; she cannot feel the strings.  Denies discharge, odor, pain, chills, fever, NV

## 2018-04-23 LAB — CERVICOVAGINAL ANCILLARY ONLY
Candida vaginitis: NEGATIVE
Chlamydia: NEGATIVE
Neisseria Gonorrhea: NEGATIVE
Trichomonas: NEGATIVE

## 2018-05-03 ENCOUNTER — Ambulatory Visit: Payer: Medicaid Other | Admitting: Obstetrics

## 2018-05-04 ENCOUNTER — Telehealth: Payer: Self-pay

## 2018-05-04 DIAGNOSIS — H9201 Otalgia, right ear: Secondary | ICD-10-CM | POA: Diagnosis not present

## 2018-05-04 DIAGNOSIS — G44209 Tension-type headache, unspecified, not intractable: Secondary | ICD-10-CM | POA: Diagnosis not present

## 2018-05-04 NOTE — Telephone Encounter (Signed)
TC from pt regarding Headache  Pt states Rx you started her on is not helping (Fiorciet) Pt wants medication management   Please advise.

## 2018-05-04 NOTE — Telephone Encounter (Signed)
Return call to pt regarding Rx request for HA's  Pt was not ava left detailed message  On vm and message was sent to provider.

## 2018-05-04 NOTE — Telephone Encounter (Signed)
Error

## 2018-06-11 ENCOUNTER — Other Ambulatory Visit: Payer: Self-pay | Admitting: *Deleted

## 2018-06-11 DIAGNOSIS — N898 Other specified noninflammatory disorders of vagina: Secondary | ICD-10-CM

## 2018-06-11 MED ORDER — SECNIDAZOLE 2 G PO PACK: 1.0000 | PACK | Freq: Once | ORAL | 2 refills | Status: AC

## 2018-06-11 NOTE — Progress Notes (Unsigned)
Pt called to office for tx for BV. Request one day medication that she has had in past. Solosec sent to pharmacy.   Pt also states she has been getting HA's again recently.  Pt states that Fioricet doesn't help much.  Pt states she has taken other meds in past that work but can't remember name.  Pt states BP has been doing well, hasn't discussed HA with PCP. Pt made aware that message to be sent to provider for recommendations.   Please advise on HA, ?Rx.

## 2018-06-12 ENCOUNTER — Other Ambulatory Visit: Payer: Self-pay | Admitting: Obstetrics

## 2018-06-12 DIAGNOSIS — B9689 Other specified bacterial agents as the cause of diseases classified elsewhere: Secondary | ICD-10-CM

## 2018-06-12 DIAGNOSIS — N760 Acute vaginitis: Principal | ICD-10-CM

## 2018-06-12 MED ORDER — SECNIDAZOLE 2 G PO PACK: 1.0000 | PACK | Freq: Once | ORAL | 2 refills | Status: AC

## 2018-06-12 NOTE — Progress Notes (Signed)
Solosec Rx for BV. Patient should consult PCP for HA meds

## 2018-06-13 NOTE — Progress Notes (Signed)
Pt made aware of recommendations. 

## 2018-06-14 ENCOUNTER — Ambulatory Visit: Payer: Medicaid Other | Admitting: Obstetrics

## 2018-06-27 DIAGNOSIS — Z9189 Other specified personal risk factors, not elsewhere classified: Secondary | ICD-10-CM | POA: Diagnosis not present

## 2018-06-27 DIAGNOSIS — J029 Acute pharyngitis, unspecified: Secondary | ICD-10-CM | POA: Diagnosis not present

## 2018-08-11 ENCOUNTER — Other Ambulatory Visit: Payer: Self-pay

## 2018-08-11 ENCOUNTER — Encounter (HOSPITAL_COMMUNITY): Payer: Self-pay

## 2018-08-11 DIAGNOSIS — K0889 Other specified disorders of teeth and supporting structures: Secondary | ICD-10-CM | POA: Diagnosis present

## 2018-08-11 DIAGNOSIS — F17210 Nicotine dependence, cigarettes, uncomplicated: Secondary | ICD-10-CM | POA: Insufficient documentation

## 2018-08-11 DIAGNOSIS — Z79899 Other long term (current) drug therapy: Secondary | ICD-10-CM | POA: Diagnosis not present

## 2018-08-11 DIAGNOSIS — K047 Periapical abscess without sinus: Secondary | ICD-10-CM | POA: Insufficient documentation

## 2018-08-11 DIAGNOSIS — I10 Essential (primary) hypertension: Secondary | ICD-10-CM | POA: Insufficient documentation

## 2018-08-11 DIAGNOSIS — K029 Dental caries, unspecified: Secondary | ICD-10-CM | POA: Insufficient documentation

## 2018-08-11 NOTE — ED Triage Notes (Addendum)
Pt arrived stating that her gum on the left side has been swelling for the last few days and something "popped" in her mouth and now her throat hurts when she swallows. Last dose of ibuprofen at 1600 today. Per pt picture, possible oral abscess.

## 2018-08-12 ENCOUNTER — Encounter (HOSPITAL_COMMUNITY): Payer: Self-pay | Admitting: Emergency Medicine

## 2018-08-12 ENCOUNTER — Emergency Department (HOSPITAL_COMMUNITY)
Admission: EM | Admit: 2018-08-12 | Discharge: 2018-08-12 | Disposition: A | Discharge status: Home / Self Care | Payer: Medicaid Other | Attending: Emergency Medicine | Admitting: Emergency Medicine

## 2018-08-12 DIAGNOSIS — K047 Periapical abscess without sinus: Secondary | ICD-10-CM

## 2018-08-12 DIAGNOSIS — K029 Dental caries, unspecified: Secondary | ICD-10-CM

## 2018-08-12 MED ORDER — ACETAMINOPHEN 500 MG PO TABS
1000.0000 mg | ORAL_TABLET | Freq: Once | ORAL | Status: AC
  Administered 2018-08-12: 03:00:00 1000 mg via ORAL
  Filled 2018-08-12: qty 2

## 2018-08-12 MED ORDER — IBUPROFEN 400 MG PO TABS: 400.0000 mg | ORAL_TABLET | Freq: Four times a day (QID) | ORAL | 0 refills | Status: DC | PRN

## 2018-08-12 MED ORDER — PENICILLIN V POTASSIUM 500 MG PO TABS
500.0000 mg | ORAL_TABLET | Freq: Once | ORAL | Status: AC
  Administered 2018-08-12: 500 mg via ORAL
  Filled 2018-08-12: qty 1

## 2018-08-12 MED ORDER — PENICILLIN V POTASSIUM 500 MG PO TABS: 500.0000 mg | ORAL_TABLET | Freq: Four times a day (QID) | ORAL | 0 refills | Status: AC

## 2018-08-12 NOTE — ED Provider Notes (Signed)
[facility] DEPT Provider Note   CSN: [id number] Arrival date & time: 08/11/18  2257     History   Chief Complaint Chief Complaint  Patient presents with  . Oral Swelling    Gum swelling  . Sore Throat    HPI Emily Saunders is a 43 y.o. female.     The history is provided by the patient.  Dental Pain Location:  Upper Upper teeth location:  14/LU 1st molar and 15/LU 2nd molar Quality:  Aching Severity:  Severe Onset quality:  Gradual Timing:  Constant Progression:  Improving Chronicity:  New Context: abscess and poor dentition   Previous work-up:  Dental exam Relieved by:  Nothing Worsened by:  Nothing Ineffective treatments:  None tried Associated symptoms: oral lesions   Associated symptoms: no congestion, no drooling, no facial swelling, no fever, no neck swelling and no trismus   Associated symptoms comment:  Gum abscess that ruptured this evening Risk factors: chewing tobacco use   Risk factors: no alcohol problem     Past Medical History:  Diagnosis Date  . H/O pilonidal cyst 2002   . Hypertension   . PID (acute pelvic inflammatory disease) 2006   . Pneumonia   . Vitamin D deficiency 2015     Patient Active Problem List   Diagnosis Date Noted  . HTN (hypertension) 02/27/2015  . Unintended weight loss 10/27/2014  . Allergic rhinitis 06/23/2014  . Elevated BP 06/23/2014  . Vitamin D insufficiency 03/03/2014  . Pilonidal cyst 03/03/2014  . Broken teeth 03/03/2014  . Poor sleep pattern 03/03/2014  . Obesity (BMI 30-39.9) 03/03/2014  . Current smoker 03/03/2014  . Anemia 05/27/2013    Past Surgical History:  Procedure Laterality Date  . CESAREAN SECTION    . CYST EXCISION Right 2003   hand     OB History    Gravida  6   Para  4   Term  4   Preterm      AB  2   Living  4     SAB  1   TAB      Ectopic  1   Multiple  0   Live Births  4            Home Medications    Prior to Admission  medications   Medication Sig Start Date End Date Taking? Authorizing Provider  amLODipine (NORVASC) 10 MG tablet Take 1 tablet (10 mg total) by mouth daily. 02/08/18  Yes Clent Demark, PA-C  hydrochlorothiazide (HYDRODIURIL) 25 MG tablet Take 1 tablet (25 mg total) by mouth daily. 02/08/18  Yes Clent Demark, PA-C  Prenat-Fe Poly-Methfol-FA-DHA (VITAFOL ULTRA) 29-0.6-0.4-200 MG CAPS Take 1 capsule by mouth daily before breakfast. 01/03/18  Yes Shelly Bombard, MD  ibuprofen (ADVIL) 400 MG tablet Take 1 tablet (400 mg total) by mouth every 6 (six) hours as needed. 08/12/18   Janavia Rottman, MD  penicillin v potassium (VEETID) 500 MG tablet Take 1 tablet (500 mg total) by mouth 4 (four) times daily for 7 days. 08/12/18 08/19/18  Lujuana Kapler, MD  varenicline (CHANTIX CONTINUING MONTH PAK) 1 MG tablet Take 1 tablet (1 mg total) by mouth 2 (two) times daily. Patient not taking: Reported on 04/05/2018 02/08/18   Clent Demark, PA-C    Family History Family History  Problem Relation Age of Onset  . Hypertension Mother   . Diabetes Mother   . Asthma Mother   . COPD Mother   .  Depression Mother   . Miscarriages / [place] Mother   . Vision loss Mother   . Heart disease Mother   . Parkinson's disease Father   . Diabetes Maternal Aunt   . Diabetes Maternal Uncle   . Diabetes Cousin   . Breast cancer Maternal Grandmother     Social History Social History   Tobacco Use  . Smoking status: Current Some Day Smoker    Packs/day: 0.50    Years: 20.00    Pack years: 10.00    Types: Cigarettes  . Smokeless tobacco: Never Used  Substance Use Topics  . Alcohol use: No    Alcohol/week: 0.0 standard drinks  . Drug use: No     Allergies   Patient has no known allergies.   Review of Systems Review of Systems  Constitutional: Negative for fever.  HENT: Positive for dental problem and mouth sores. Negative for congestion, drooling, facial swelling, sore throat, trouble swallowing  and voice change.   Respiratory: Negative for cough, chest tightness and shortness of breath.   Cardiovascular: Negative for chest pain.  All other systems reviewed and are negative.    Physical Exam Updated Vital Signs BP (!) 142/102 (BP Location: Right Arm)   Pulse 69   Temp 98.9 F (37.2 C) (Oral)   Resp 16   Ht 5\' 6"  (1.676 m)   Wt 96.2 kg   SpO2 100%   BMI 34.22 kg/m   Physical Exam Vitals signs reviewed.  Constitutional:      General: She is not in acute distress.    Appearance: She is normal weight.  HENT:     Head: Normocephalic and atraumatic.     Nose: Nose normal.     Mouth/Throat:     Mouth: Mucous membranes are moist.     Dentition: Dental caries present.     Comments: Dehisced gingival abscess Eyes:     Conjunctiva/sclera: Conjunctivae normal.     Pupils: Pupils are equal, round, and reactive to light.  Neck:     Musculoskeletal: Normal range of motion and neck supple. No neck rigidity.  Cardiovascular:     Rate and Rhythm: Normal rate and regular rhythm.     Pulses: Normal pulses.     Heart sounds: Normal heart sounds.  Pulmonary:     Effort: Pulmonary effort is normal.     Breath sounds: Normal breath sounds.  Abdominal:     General: Abdomen is flat. Bowel sounds are normal.     Tenderness: There is no abdominal tenderness.  Musculoskeletal: Normal range of motion.  Lymphadenopathy:     Cervical: No cervical adenopathy.  Skin:    General: Skin is warm and dry.     Capillary Refill: Capillary refill takes less than 2 seconds.  Neurological:     General: No focal deficit present.     Mental Status: She is alert and oriented to person, place, and time.  Psychiatric:        Mood and Affect: Mood normal.        Behavior: Behavior normal.      ED Treatments / Results  Labs (all labs ordered are listed, but only abnormal results are displayed) Labs Reviewed - No data to display  EKG None  Radiology No results found.  Procedures  Procedures (including critical care time)  Medications Ordered in ED Medications  penicillin v potassium (VEETID) tablet 500 mg (has no administration in time range)  acetaminophen (TYLENOL) tablet 1,000 mg (has no administration in  time range)     Final Clinical Impressions(s) / ED Diagnoses   Final diagnoses:  Dental abscess  Dental caries   Return for intractable cough, coughing up blood,fevers >100.4 unrelieved by medication, shortness of breath, intractable vomiting, chest pain, shortness of breath, weakness,numbness, changes in speech, facial asymmetry,abdominal pain, passing out,Inability to tolerate liquids or food, cough, altered mental status or any concerns. No signs of systemic illness or infection. The patient is nontoxic-appearing on exam and vital signs are within normal limits.   I have reviewed the triage vital signs and the nursing notes. Pertinent labs &imaging results that were available during my care of the patient were reviewed by me and considered in my medical decision making (see chart for details).  After history, exam, and medical workup I feel the patient has been appropriately medically screened and is safe for discharge home. Pertinent diagnoses were discussed with the patient. Patient was given return precautions ED Discharge Orders         Ordered    penicillin v potassium (VEETID) 500 MG tablet  4 times daily     08/12/18 0226    ibuprofen (ADVIL) 400 MG tablet  Every 6 hours PRN     08/12/18 0226           Anajulia Leyendecker, MD 08/12/18 [id number]

## 2018-08-12 NOTE — ED Notes (Signed)
Bed: WTR5 Expected date:  Expected time:  Means of arrival:  Comments: Held

## 2018-08-13 ENCOUNTER — Other Ambulatory Visit (INDEPENDENT_AMBULATORY_CARE_PROVIDER_SITE_OTHER): Payer: Self-pay

## 2018-08-13 ENCOUNTER — Other Ambulatory Visit (INDEPENDENT_AMBULATORY_CARE_PROVIDER_SITE_OTHER): Payer: Self-pay | Admitting: Primary Care

## 2018-08-13 ENCOUNTER — Other Ambulatory Visit: Payer: Self-pay | Admitting: Oral Surgery

## 2018-08-13 DIAGNOSIS — I10 Essential (primary) hypertension: Secondary | ICD-10-CM

## 2018-08-13 DIAGNOSIS — R220 Localized swelling, mass and lump, head: Secondary | ICD-10-CM

## 2018-08-13 MED ORDER — AMLODIPINE BESYLATE 10 MG PO TABS: 10.0000 mg | ORAL_TABLET | Freq: Every day | ORAL | 0 refills | Status: DC

## 2018-08-13 NOTE — Telephone Encounter (Signed)
Fax request received from pharmacy to refill amlodipine. Refill sent. Patient needs appointment before receiving any other refills. Nat Christen, CMA

## 2018-08-13 NOTE — Progress Notes (Unsigned)
  Reason for Consult: Maxillary swelling Referring Physician: general dentist  Emily Saunders is an 43 y.o. female.  EN:IDPOEUM of swelling left maxilla. Denture doesn't fit well.    HPI: Multiple denture adjustments by general dentist not helpful. Patient states swelling recently burst from infection  Past Medical History:  Diagnosis Date  . H/O pilonidal cyst 2002   . Hypertension   . PID (acute pelvic inflammatory disease) 2006   . Pneumonia   . Vitamin D deficiency 2015     Past Surgical History:  Procedure Laterality Date  . CESAREAN SECTION    . CYST EXCISION Right 2003   hand    Family History  Problem Relation Age of Onset  . Hypertension Mother   . Diabetes Mother   . Asthma Mother   . COPD Mother   . Depression Mother   . Miscarriages / [place] Mother   . Vision loss Mother   . Heart disease Mother   . Parkinson's disease Father   . Diabetes Maternal Aunt   . Diabetes Maternal Uncle   . Diabetes Cousin   . Breast cancer Maternal Grandmother     Social History:  reports that she has been smoking cigarettes. She has a 10.00 pack-year smoking history. She has never used smokeless tobacco. She reports that she does not drink alcohol or use drugs.  Allergies: No Known Allergies  Medications: I have reviewed the patient's current medications.  No results found for this or any previous visit (from the past 48 hour(s)).  No results found.  ROS There were no vitals taken for this visit. General appearance: alert, cooperative and no distress Head: Normocephalic, without obvious abnormality, atraumatic Eyes: negative Nose: Nares normal. Septum midline. Mucosa normal. No drainage or sinus tenderness. Throat: *** Neck: no adenopathy, no carotid bruit and thyroid not enlarged, symmetric, no tenderness/mass/nodules  Panorex: 2 cm x 3 cm radiolucency left posterior maxilla with 2 cm x 1 cm radiopaque area. Possible cyst/septum Assessment/Plan: CT  maxilla/sinus   Diona Browner 08/13/2018, 11:09 AM

## 2018-08-15 ENCOUNTER — Telehealth (INDEPENDENT_AMBULATORY_CARE_PROVIDER_SITE_OTHER): Payer: Medicaid Other | Admitting: Primary Care

## 2018-08-15 ENCOUNTER — Other Ambulatory Visit: Payer: Self-pay

## 2018-08-15 DIAGNOSIS — F17210 Nicotine dependence, cigarettes, uncomplicated: Secondary | ICD-10-CM

## 2018-08-15 DIAGNOSIS — I10 Essential (primary) hypertension: Secondary | ICD-10-CM

## 2018-08-15 DIAGNOSIS — Z7689 Persons encountering health services in other specified circumstances: Secondary | ICD-10-CM | POA: Diagnosis not present

## 2018-08-15 DIAGNOSIS — E669 Obesity, unspecified: Secondary | ICD-10-CM

## 2018-08-15 MED ORDER — AMLODIPINE BESYLATE 10 MG PO TABS: 10.0000 mg | ORAL_TABLET | Freq: Every day | ORAL | 3 refills | Status: DC

## 2018-08-15 MED ORDER — HYDROCHLOROTHIAZIDE 25 MG PO TABS: 25.0000 mg | ORAL_TABLET | Freq: Every day | ORAL | 3 refills | Status: DC

## 2018-08-15 NOTE — Progress Notes (Signed)
Virtual Visit via Telephone Note  I connected with Emily Saunders on 08/15/18 at  1:30 PM EDT by telephone and verified that I am speaking with the correct person using two identifiers.   I discussed the limitations, risks, security and privacy concerns of performing an evaluation and management service by telephone and the availability of in person appointments. I also discussed with the patient that there may be a patient responsible charge related to this service. The patient expressed understanding and agreed to proceed.   History of Present Illness: Emily Saunders is in today to establish care with a new provider.  She voices no complaints or concerns.  No significant past medical history   Observations/Objective: Review of Systems  Respiratory: Positive for cough, shortness of breath and stridor.        With exertion  Neurological: Positive for headaches.  Psychiatric/Behavioral: Negative for suicidal ideas.    Assessment and Plan: Emily Saunders was seen today for establish care.  Diagnoses and all orders for this visit:  Hypertension, unspecified type Counseled on blood pressure goal of less than 130/80, low-sodium, DASH diet, medication compliance, 150 minutes of moderate intensity exercise per week. Discussed medication compliance, adverse effects. -     CMP14+EGFR; Future -     CBC with Differential/Platelet; Future -     amLODipine (NORVASC) 10 MG tablet; Take 1 tablet (10 mg total) by mouth daily. -     hydrochlorothiazide (HYDRODIURIL) 25 MG tablet; Take 1 tablet (25 mg total) by mouth daily.  Tobacco dependence due to cigarettes Nicotine can decrease circulation in you body and affect every organ. Reseach shows increase in lung cancer and respiratory problems. When you are ready to stop let's talk.  Encounter to establish care With new provider and refill blood pressure medication. Follow up visit will be in person for BP reading and labs  Obesity (BMI 30-39.9)  Healthy lifestyle diet of fruits vegetables fish nuts whole grains and low saturated fat . Exercise vigorously  30 minutes daily -     Lipid Panel; Future    Follow Up Instructions:    I discussed the assessment and treatment plan with the patient. The patient was provided an opportunity to ask questions and all were answered. The patient agreed with the plan and demonstrated an understanding of the instructions.   The patient was advised to call back or seek an in-person evaluation if the symptoms worsen or if the condition fails to improve as anticipated.  I provided 25 minutes of non-face-to-face time during this encounter.  This includes reviewing previous encounters labs and imaging   [name], NP

## 2018-08-16 ENCOUNTER — Ambulatory Visit: Payer: Medicaid Other | Admitting: Obstetrics

## 2018-08-23 ENCOUNTER — Other Ambulatory Visit (HOSPITAL_COMMUNITY)
Admission: RE | Admit: 2018-08-23 | Discharge: 2018-08-23 | Disposition: A | Discharge status: Home / Self Care | Payer: Medicaid Other | Source: Ambulatory Visit | Attending: Obstetrics | Admitting: Obstetrics

## 2018-08-23 ENCOUNTER — Other Ambulatory Visit: Payer: Self-pay

## 2018-08-23 ENCOUNTER — Encounter: Payer: Self-pay | Admitting: Obstetrics

## 2018-08-23 ENCOUNTER — Ambulatory Visit: Payer: Medicaid Other | Admitting: Obstetrics

## 2018-08-23 VITALS — BP 115/79 | HR 93 | Temp 99.0°F | Wt 211.0 lb

## 2018-08-23 DIAGNOSIS — Z113 Encounter for screening for infections with a predominantly sexual mode of transmission: Secondary | ICD-10-CM

## 2018-08-23 DIAGNOSIS — Z0000 Encounter for general adult medical examination without abnormal findings: Secondary | ICD-10-CM

## 2018-08-23 DIAGNOSIS — N898 Other specified noninflammatory disorders of vagina: Secondary | ICD-10-CM

## 2018-08-23 DIAGNOSIS — Z01419 Encounter for gynecological examination (general) (routine) without abnormal findings: Secondary | ICD-10-CM | POA: Diagnosis not present

## 2018-08-23 DIAGNOSIS — F17210 Nicotine dependence, cigarettes, uncomplicated: Secondary | ICD-10-CM

## 2018-08-23 NOTE — Progress Notes (Signed)
Subjective:        Emily Saunders is a 42 y.o. female here for a routine exam.  Current complaints: None.    Personal health questionnaire:  Is patient Ashkenazi Jewish, have a family history of breast and/or ovarian cancer: no Is there a family history of uterine cancer diagnosed at age < 61, gastrointestinal cancer, urinary tract cancer, family member who is a Field seismologist syndrome-associated carrier: no Is the patient overweight and hypertensive, family history of diabetes, personal history of gestational diabetes, preeclampsia or PCOS: no Is patient over 36, have PCOS,  family history of premature CHD under age 26, diabetes, smoke, have hypertension or peripheral artery disease:  no At any time, has a partner hit, kicked or otherwise hurt or frightened you?: no Over the past 2 weeks, have you felt down, depressed or hopeless?: no Over the past 2 weeks, have you felt little interest or pleasure in doing things?:no   Gynecologic History Patient's last menstrual period was 08/16/2018. Contraception: none Last Pap: 06-18-2015. Results were: normal Last mammogram: 04-17-2018. Results were: normal  Obstetric History OB History  Gravida Para Term Preterm AB Living  6 4 4   2 4   SAB TAB Ectopic Multiple Live Births  1   1 0 4    # Outcome Date GA Lbr Len/2nd Weight Sex Delivery Anes PTL Lv  6 Term 12/16/15 [redacted]w[redacted]d 02:30 / 00:16 6 lb 15.6 oz (3.165 kg) F VBAC EPI  LIV     Birth Comments: none  5 SAB 02/14/13        DEC     Birth Comments: System Generated. Please review and update pregnancy details.  4 Ectopic 2005        DEC  3 Term 04/04/01 [redacted]w[redacted]d  7 lb (3.175 kg) M Vag-Spont EPI  LIV  2 Term 01/15/97 107w0d  7 lb (3.175 kg) F Vag-Spont EPI  LIV  1 Term 04/11/93 [redacted]w[redacted]d  7 lb (3.175 kg) F CS-LTranv EPI  LIV    Past Medical History:  Diagnosis Date  . H/O pilonidal cyst 2002   . Hypertension   . PID (acute pelvic inflammatory disease) 2006   . Pneumonia   . Vitamin D deficiency 2015      Past Surgical History:  Procedure Laterality Date  . CESAREAN SECTION    . CYST EXCISION Right 2003   hand     Current Outpatient Medications:  .  amLODipine (NORVASC) 10 MG tablet, Take 1 tablet (10 mg total) by mouth daily., Disp: 30 tablet, Rfl: 3 .  hydrochlorothiazide (HYDRODIURIL) 25 MG tablet, Take 1 tablet (25 mg total) by mouth daily., Disp: 30 tablet, Rfl: 3 .  ibuprofen (ADVIL) 400 MG tablet, Take 1 tablet (400 mg total) by mouth every 6 (six) hours as needed. (Patient not taking: Reported on 08/23/2018), Disp: 12 tablet, Rfl: 0 .  Prenat-Fe Poly-Methfol-FA-DHA (VITAFOL ULTRA) 29-0.6-0.4-200 MG CAPS, Take 1 capsule by mouth daily before breakfast. (Patient not taking: Reported on 08/23/2018), Disp: 30 capsule, Rfl: 11 .  varenicline (CHANTIX CONTINUING MONTH PAK) 1 MG tablet, Take 1 tablet (1 mg total) by mouth 2 (two) times daily. (Patient not taking: Reported on 04/05/2018), Disp: 60 tablet, Rfl: 1 No Known Allergies  Social History   Tobacco Use  . Smoking status: Current Every Day Smoker    Packs/day: 0.50    Years: 20.00    Pack years: 10.00    Types: Cigarettes  . Smokeless tobacco: Never Used  Substance Use  Topics  . Alcohol use: No    Alcohol/week: 0.0 standard drinks    Family History  Problem Relation Age of Onset  . Hypertension Mother   . Diabetes Mother   . Asthma Mother   . COPD Mother   . Depression Mother   . Miscarriages / [place] Mother   . Vision loss Mother   . Heart disease Mother   . Parkinson's disease Father   . Diabetes Maternal Aunt   . Diabetes Maternal Uncle   . Diabetes Cousin   . Breast cancer Maternal Grandmother   . Leukemia Sister       Review of Systems  Constitutional: negative for fatigue and weight loss Respiratory: negative for cough and wheezing Cardiovascular: negative for chest pain, fatigue and palpitations Gastrointestinal: negative for abdominal pain and change in bowel habits Musculoskeletal:negative for  myalgias Neurological: negative for gait problems and tremors Behavioral/Psych: negative for abusive relationship, depression Endocrine: negative for temperature intolerance    Genitourinary:negative for abnormal menstrual periods, genital lesions, hot flashes, sexual problems and vaginal discharge Integument/breast: negative for breast lump, breast tenderness, nipple discharge and skin lesion(s)    Objective:       BP 115/79   Pulse 93   Temp 99 F (37.2 C)   Wt 211 lb (95.7 kg)   LMP 08/16/2018   BMI 34.06 kg/m  General:   alert  Skin:   no rash or abnormalities  Lungs:   clear to auscultation bilaterally  Heart:   regular rate and rhythm, S1, S2 normal, no murmur, click, rub or gallop  Breasts:   normal without suspicious masses, skin or nipple changes or axillary nodes  Abdomen:  normal findings: no organomegaly, soft, non-tender and no hernia  Pelvis:  External genitalia: normal general appearance Urinary system: urethral meatus normal and bladder without fullness, nontender Vaginal: normal without tenderness, induration or masses Cervix: normal appearance Adnexa: normal bimanual exam Uterus: anteverted and non-tender, normal size   Lab Review Urine pregnancy test Labs reviewed yes Radiologic studies reviewed no  50% of 25 min visit spent on counseling and coordination of care.   Assessment:     1. Encounter for routine gynecological examination with Papanicolaou smear of cervix Rx: - Cytology - PAP( [facility])  2. Vaginal discharge Rx: - Cervicovaginal ancillary only( )  3. Screen for STD (sexually transmitted disease) Rx: - Hepatitis B surface antigen - Hepatitis C antibody - HIV Antibody (routine testing w rflx) - RPR  4. Tobacco dependence due to cigarettes - cessation with medication and behavioral modification recommended  5. Contraceptive Counseling - declines contraception.  Considering having another child   Plan:    Education  reviewed: calcium supplements, depression evaluation, low fat, low cholesterol diet, safe sex/STD prevention, self breast exams, smoking cessation and weight bearing exercise. Contraception: none. Follow up in: 1 year.   No orders of the defined types were placed in this encounter.  Orders Placed This Encounter  Procedures  . Hepatitis B surface antigen  . Hepatitis C antibody  . HIV Antibody (routine testing w rflx)  . RPR    Shelly Bombard MD 08-23-2018

## 2018-08-24 LAB — HIV ANTIBODY (ROUTINE TESTING W REFLEX): HIV Screen 4th Generation wRfx: NONREACTIVE

## 2018-08-24 LAB — HEPATITIS C ANTIBODY: Hep C Virus Ab: <0.1 s/co ratio (ref 0.0–0.9)

## 2018-08-24 LAB — HEPATITIS B SURFACE ANTIGEN: Hepatitis B Surface Ag: NEGATIVE

## 2018-08-24 LAB — RPR: RPR Ser Ql: NONREACTIVE

## 2018-08-27 LAB — CERVICOVAGINAL ANCILLARY ONLY
Bacterial vaginitis: POSITIVE — AB
Candida vaginitis: NEGATIVE
Chlamydia: NEGATIVE
Neisseria Gonorrhea: NEGATIVE
Trichomonas: NEGATIVE

## 2018-08-28 ENCOUNTER — Other Ambulatory Visit: Payer: Self-pay | Admitting: Obstetrics

## 2018-08-28 DIAGNOSIS — B9689 Other specified bacterial agents as the cause of diseases classified elsewhere: Secondary | ICD-10-CM

## 2018-08-28 DIAGNOSIS — N760 Acute vaginitis: Secondary | ICD-10-CM

## 2018-08-28 LAB — CYTOLOGY - PAP
Diagnosis: NEGATIVE
HPV: NOT DETECTED

## 2018-08-28 MED ORDER — TINIDAZOLE 500 MG PO TABS: 1000.0000 mg | ORAL_TABLET | Freq: Every day | ORAL | 2 refills | Status: DC

## 2018-11-26 ENCOUNTER — Ambulatory Visit (INDEPENDENT_AMBULATORY_CARE_PROVIDER_SITE_OTHER): Payer: Medicaid Other | Admitting: Primary Care

## 2018-11-26 ENCOUNTER — Encounter (INDEPENDENT_AMBULATORY_CARE_PROVIDER_SITE_OTHER): Payer: Self-pay | Admitting: Primary Care

## 2018-11-26 ENCOUNTER — Other Ambulatory Visit: Payer: Self-pay

## 2018-11-26 VITALS — BP 124/82 | HR 76 | Temp 97.5°F | Ht 66.0 in | Wt 213.6 lb

## 2018-11-26 DIAGNOSIS — Z131 Encounter for screening for diabetes mellitus: Secondary | ICD-10-CM | POA: Diagnosis not present

## 2018-11-26 DIAGNOSIS — I10 Essential (primary) hypertension: Secondary | ICD-10-CM | POA: Diagnosis not present

## 2018-11-26 DIAGNOSIS — Z23 Encounter for immunization: Secondary | ICD-10-CM | POA: Diagnosis not present

## 2018-11-26 DIAGNOSIS — L0591 Pilonidal cyst without abscess: Secondary | ICD-10-CM

## 2018-11-26 DIAGNOSIS — F17200 Nicotine dependence, unspecified, uncomplicated: Secondary | ICD-10-CM

## 2018-11-26 DIAGNOSIS — Z13220 Encounter for screening for lipoid disorders: Secondary | ICD-10-CM

## 2018-11-26 LAB — POCT GLYCOSYLATED HEMOGLOBIN (HGB A1C): Hemoglobin A1C: 5.6 % (ref 4.0–5.6)

## 2018-11-26 MED ORDER — HYDROCHLOROTHIAZIDE 25 MG PO TABS: 25.0000 mg | ORAL_TABLET | Freq: Every day | ORAL | 3 refills | Status: DC

## 2018-11-26 MED ORDER — AMLODIPINE BESYLATE 10 MG PO TABS: 10.0000 mg | ORAL_TABLET | Freq: Every day | ORAL | 3 refills | Status: DC

## 2018-11-26 MED ORDER — BLOOD PRESSURE KIT DEVI: 1.0000 mL | Freq: Three times a day (TID) | 0 refills | Status: DC

## 2018-11-26 NOTE — Patient Instructions (Signed)
° °Calorie Counting for Weight Loss °Calories are units of energy. Your body needs a certain amount of calories from food to keep you going throughout the day. When you eat more calories than your body needs, your body stores the extra calories as fat. When you eat fewer calories than your body needs, your body burns fat to get the energy it needs. °Calorie counting means keeping track of how many calories you eat and drink each day. Calorie counting can be helpful if you need to lose weight. If you make sure to eat fewer calories than your body needs, you should lose weight. Ask your health care provider what a healthy weight is for you. °For calorie counting to work, you will need to eat the right number of calories in a day in order to lose a healthy amount of weight per week. A dietitian can help you determine how many calories you need in a day and will give you suggestions on how to reach your calorie goal. °· A healthy amount of weight to lose per week is usually 1-2 lb (0.5-0.9 kg). This usually means that your daily calorie intake should be reduced by 500-750 calories. °· Eating 1,200 - 1,500 calories per day can help most women lose weight. °· Eating 1,500 - 1,800 calories per day can help most men lose weight. °What is my plan? °My goal is to have __________ calories per day. °If I have this many calories per day, I should lose around __________ pounds per week. °What do I need to know about calorie counting? °In order to meet your daily calorie goal, you will need to: °· Find out how many calories are in each food you would like to eat. Try to do this before you eat. °· Decide how much of the food you plan to eat. °· Write down what you ate and how many calories it had. Doing this is called keeping a food log. °To successfully lose weight, it is important to balance calorie counting with a healthy lifestyle that includes regular activity. Aim for 150 minutes of moderate exercise (such as walking) or 75  minutes of vigorous exercise (such as running) each week. °Where do I find calorie information? ° °The number of calories in a food can be found on a Nutrition Facts label. If a food does not have a Nutrition Facts label, try to look up the calories online or ask your dietitian for help. °Remember that calories are listed per serving. If you choose to have more than one serving of a food, you will have to multiply the calories per serving by the amount of servings you plan to eat. For example, the label on a package of bread might say that a serving size is 1 slice and that there are 90 calories in a serving. If you eat 1 slice, you will have eaten 90 calories. If you eat 2 slices, you will have eaten 180 calories. °How do I keep a food log? °Immediately after each meal, record the following information in your food log: °· What you ate. Don't forget to include toppings, sauces, and other extras on the food. °· How much you ate. This can be measured in cups, ounces, or number of items. °· How many calories each food and drink had. °· The total number of calories in the meal. °Keep your food log near you, such as in a small notebook in your pocket, or use a mobile app or website. Some programs will   calculate calories for you and show you how many calories you have left for the day to meet your goal. °What are some calorie counting tips? ° °· Use your calories on foods and drinks that will fill you up and not leave you hungry: °? Some examples of foods that fill you up are nuts and nut butters, vegetables, lean proteins, and high-fiber foods like whole grains. High-fiber foods are foods with more than 5 g fiber per serving. °? Drinks such as sodas, specialty coffee drinks, alcohol, and juices have a lot of calories, yet do not fill you up. °· Eat nutritious foods and avoid empty calories. Empty calories are calories you get from foods or beverages that do not have many vitamins or protein, such as candy, sweets, and  soda. It is better to have a nutritious high-calorie food (such as an avocado) than a food with few nutrients (such as a bag of chips). °· Know how many calories are in the foods you eat most often. This will help you calculate calorie counts faster. °· Pay attention to calories in drinks. Low-calorie drinks include water and unsweetened drinks. °· Pay attention to nutrition labels for "low fat" or "fat free" foods. These foods sometimes have the same amount of calories or more calories than the full fat versions. They also often have added sugar, starch, or salt, to make up for flavor that was removed with the fat. °· Find a way of tracking calories that works for you. Get creative. Try different apps or programs if writing down calories does not work for you. °What are some portion control tips? °· Know how many calories are in a serving. This will help you know how many servings of a certain food you can have. °· Use a measuring cup to measure serving sizes. You could also try weighing out portions on a kitchen scale. With time, you will be able to estimate serving sizes for some foods. °· Take some time to put servings of different foods on your favorite plates, bowls, and cups so you know what a serving looks like. °· Try not to eat straight from a bag or box. Doing this can lead to overeating. Put the amount you would like to eat in a cup or on a plate to make sure you are eating the right portion. °· Use smaller plates, glasses, and bowls to prevent overeating. °· Try not to multitask (for example, watch TV or use your computer) while eating. If it is time to eat, sit down at a table and enjoy your food. This will help you to know when you are full. It will also help you to be aware of what you are eating and how much you are eating. °What are tips for following this plan? °Reading food labels °· Check the calorie count compared to the serving size. The serving size may be smaller than what you are used to  eating. °· Check the source of the calories. Make sure the food you are eating is high in vitamins and protein and low in saturated and trans fats. °Shopping °· Read nutrition labels while you shop. This will help you make healthy decisions before you decide to purchase your food. °· Make a grocery list and stick to it. °Cooking °· Try to cook your favorite foods in a healthier way. For example, try baking instead of frying. °· Use low-fat dairy products. °Meal planning °· Use more fruits and vegetables. Half of your plate should be   fruits and vegetables. °· Include lean proteins like poultry and fish. °How do I count calories when eating out? °· Ask for smaller portion sizes. °· Consider sharing an entree and sides instead of getting your own entree. °· If you get your own entree, eat only half. Ask for a box at the beginning of your meal and put the rest of your entree in it so you are not tempted to eat it. °· If calories are listed on the menu, choose the lower calorie options. °· Choose dishes that include vegetables, fruits, whole grains, low-fat dairy products, and lean protein. °· Choose items that are boiled, broiled, grilled, or steamed. Stay away from items that are buttered, battered, fried, or served with cream sauce. Items labeled "crispy" are usually fried, unless stated otherwise. °· Choose water, low-fat milk, unsweetened iced tea, or other drinks without added sugar. If you want an alcoholic beverage, choose a lower calorie option such as a glass of wine or light beer. °· Ask for dressings, sauces, and syrups on the side. These are usually high in calories, so you should limit the amount you eat. °· If you want a salad, choose a garden salad and ask for grilled meats. Avoid extra toppings like bacon, cheese, or fried items. Ask for the dressing on the side, or ask for olive oil and vinegar or lemon to use as dressing. °· Estimate how many servings of a food you are given. For example, a serving of  cooked rice is ½ cup or about the size of half a baseball. Knowing serving sizes will help you be aware of how much food you are eating at restaurants. The list below tells you how big or small some common portion sizes are based on everyday objects: °? 1 oz--4 stacked dice. °? 3 oz--1 deck of cards. °? 1 tsp--1 die. °? 1 Tbsp--½ a ping-pong ball. °? 2 Tbsp--1 ping-pong ball. °? ½ cup--½ baseball. °? 1 cup--1 baseball. °Summary °· Calorie counting means keeping track of how many calories you eat and drink each day. If you eat fewer calories than your body needs, you should lose weight. °· A healthy amount of weight to lose per week is usually 1-2 lb (0.5-0.9 kg). This usually means reducing your daily calorie intake by 500-750 calories. °· The number of calories in a food can be found on a Nutrition Facts label. If a food does not have a Nutrition Facts label, try to look up the calories online or ask your dietitian for help. °· Use your calories on foods and drinks that will fill you up, and not on foods and drinks that will leave you hungry. °· Use smaller plates, glasses, and bowls to prevent overeating. °This information is not intended to replace advice given to you by your health care provider. Make sure you discuss any questions you have with your health care provider. °Document Released: 01/24/2005 Document Revised: 10/13/2017 Document Reviewed: 12/25/2015 °Elsevier Patient Education © 2020 [employer] ° °

## 2018-11-27 ENCOUNTER — Other Ambulatory Visit (INDEPENDENT_AMBULATORY_CARE_PROVIDER_SITE_OTHER): Payer: Self-pay | Admitting: Primary Care

## 2018-11-27 LAB — CMP14+EGFR
ALT: 12 IU/L (ref 0–32)
AST: 13 IU/L (ref 0–40)
Albumin/Globulin Ratio: 2.1 (ref 1.2–2.2)
Albumin: 4.6 g/dL (ref 3.8–4.8)
Alkaline Phosphatase: 42 IU/L (ref 39–117)
BUN/Creatinine Ratio: 11 (ref 9–23)
BUN: 9 mg/dL (ref 6–24)
Bilirubin Total: <0.2 mg/dL (ref 0.0–1.2)
CO2: 23 mmol/L (ref 20–29)
Calcium: 9.2 mg/dL (ref 8.7–10.2)
Chloride: 106 mmol/L (ref 96–106)
Creatinine, Ser: 0.8 mg/dL (ref 0.57–1.00)
GFR calc Af Amer: 104 mL/min/{1.73_m2} (ref >59)
GFR calc non Af Amer: 91 mL/min/{1.73_m2} (ref >59)
Globulin, Total: 2.2 g/dL (ref 1.5–4.5)
Glucose: 84 mg/dL (ref 65–99)
Potassium: 3.7 mmol/L (ref 3.5–5.2)
Sodium: 144 mmol/L (ref 134–144)
Total Protein: 6.8 g/dL (ref 6.0–8.5)

## 2018-11-27 LAB — LIPID PANEL
Chol/HDL Ratio: 4.1 ratio (ref 0.0–4.4)
Cholesterol, Total: 149 mg/dL (ref 100–199)
HDL: 36 mg/dL — ABNORMAL LOW (ref >39)
LDL Chol Calc (NIH): 80 mg/dL (ref 0–99)
Triglycerides: 194 mg/dL — ABNORMAL HIGH (ref 0–149)
VLDL Cholesterol Cal: 33 mg/dL (ref 5–40)

## 2018-11-27 LAB — CBC WITH DIFFERENTIAL/PLATELET
Basophils Absolute: 0 10*3/uL (ref 0.0–0.2)
Basos: 1 %
EOS (ABSOLUTE): 0.3 10*3/uL (ref 0.0–0.4)
Eos: 4 %
Hematocrit: 38.7 % (ref 34.0–46.6)
Hemoglobin: 13.2 g/dL (ref 11.1–15.9)
Immature Grans (Abs): 0 10*3/uL (ref 0.0–0.1)
Immature Granulocytes: 0 %
Lymphocytes Absolute: 2.6 10*3/uL (ref 0.7–3.1)
Lymphs: 34 %
MCH: 30.6 pg (ref 26.6–33.0)
MCHC: 34.1 g/dL (ref 31.5–35.7)
MCV: 90 fL (ref 79–97)
Monocytes Absolute: 0.5 10*3/uL (ref 0.1–0.9)
Monocytes: 7 %
Neutrophils Absolute: 4 10*3/uL (ref 1.4–7.0)
Neutrophils: 54 %
Platelets: 205 10*3/uL (ref 150–450)
RBC: 4.32 x10E6/uL (ref 3.77–5.28)
RDW: 13.6 % (ref 11.7–15.4)
WBC: 7.6 10*3/uL (ref 3.4–10.8)

## 2018-11-27 MED ORDER — ATORVASTATIN CALCIUM 20 MG PO TABS: 20.0000 mg | ORAL_TABLET | Freq: Every day | ORAL | 3 refills | Status: DC

## 2018-11-27 NOTE — Progress Notes (Unsigned)
at

## 2018-11-30 ENCOUNTER — Other Ambulatory Visit (INDEPENDENT_AMBULATORY_CARE_PROVIDER_SITE_OTHER): Payer: Self-pay | Admitting: Primary Care

## 2018-11-30 MED ORDER — BLOOD PRESSURE KIT DEVI: 1.0000 mL | Freq: Three times a day (TID) | 0 refills | Status: AC

## 2018-12-03 DIAGNOSIS — I10 Essential (primary) hypertension: Secondary | ICD-10-CM | POA: Diagnosis not present

## 2018-12-10 ENCOUNTER — Telehealth: Payer: Self-pay | Admitting: *Deleted

## 2018-12-10 NOTE — Telephone Encounter (Signed)
PA for Tinidazole received from pharmacy via fax. PA was completed with [employer] online and approved.  Approval was sent to pharmacy.

## 2018-12-19 ENCOUNTER — Other Ambulatory Visit (INDEPENDENT_AMBULATORY_CARE_PROVIDER_SITE_OTHER): Payer: Self-pay | Admitting: Primary Care

## 2018-12-19 DIAGNOSIS — I10 Essential (primary) hypertension: Secondary | ICD-10-CM

## 2018-12-19 MED ORDER — HYDROCHLOROTHIAZIDE 25 MG PO TABS: 25.0000 mg | ORAL_TABLET | Freq: Every day | ORAL | 3 refills | Status: DC

## 2018-12-19 MED ORDER — AMLODIPINE BESYLATE 10 MG PO TABS: 10.0000 mg | ORAL_TABLET | Freq: Every day | ORAL | 3 refills | Status: DC

## 2018-12-28 ENCOUNTER — Ambulatory Visit: Payer: Medicaid Other

## 2019-01-08 ENCOUNTER — Ambulatory Visit: Payer: Medicaid Other

## 2019-02-04 DIAGNOSIS — N819 Female genital prolapse, unspecified: Secondary | ICD-10-CM | POA: Diagnosis not present

## 2019-02-06 ENCOUNTER — Other Ambulatory Visit: Payer: Self-pay

## 2019-02-06 ENCOUNTER — Encounter: Payer: Self-pay | Admitting: Obstetrics & Gynecology

## 2019-02-06 ENCOUNTER — Ambulatory Visit: Payer: Medicaid Other | Admitting: Obstetrics & Gynecology

## 2019-02-06 ENCOUNTER — Ambulatory Visit (INDEPENDENT_AMBULATORY_CARE_PROVIDER_SITE_OTHER): Payer: Medicaid Other | Admitting: Primary Care

## 2019-02-06 VITALS — BP 125/84 | HR 85 | Ht 68.0 in | Wt 213.6 lb

## 2019-02-06 DIAGNOSIS — N898 Other specified noninflammatory disorders of vagina: Secondary | ICD-10-CM

## 2019-02-06 DIAGNOSIS — L918 Other hypertrophic disorders of the skin: Secondary | ICD-10-CM | POA: Diagnosis not present

## 2019-02-06 NOTE — Progress Notes (Signed)
History:  43 y.o. [id number] here today for eval of 'uterine prolapse' Pt has had SVD x4 and 1 ectopic and another SAB with no D&C. She denies complicated vaginal deliveries. She reports that she felt a mass protruding from her vagina x2 and went to have it evaluated and was told that she had uterine prolapse. Pt denies leakage of urine or irreg menses. She is frustrated with this mass because it feel odd. There is no assoc pain.  She is worried about what it could be.    Pt reports that during the pregnancy of her first child she was notified of a vaginal mass. She reports that the doctors were worried about ddelviery vaginally due to this. She does not recall until further issues until now.    The following portions of the patient's history were reviewed and updated as appropriate: allergies, current medications, past family history, past medical history, past social history, past surgical history and problem list.  Review of Systems:  Pertinent items are noted in HPI.    Objective:  Physical Exam Blood pressure 125/84, pulse 85, height 5\' 8"  (1.727 m), weight 213 lb 9.6 oz (96.9 kg), last menstrual period 01/31/2019.  CONSTITUTIONAL: Well-developed, well-nourished female in no acute distress.  HENT:  Normocephalic, atraumatic EYES: Conjunctivae and EOM are normal. No scleral icterus.  NECK: Normal range of motion SKIN: Skin is warm and dry. No rash noted. Not diaphoretic.No pallor. [place]: Alert and oriented to person, place, and time. Normal coordination.  Abd: Soft, nontender and nondistended Pelvic: Normal appearing external genitalia; There is a large 4x3 cm vaginal tag on the left side near the introitus. This prolapses through the introitus to the perineum. The cervix appears normal and there is not CMT. The uterus is small and NOT prolapsed. Normal discharge.  Small There are no other palpable masses or adnexal tenderness   Assessment & Plan:  Large vaginal tag prolapsing though  the introitus.   Pt requests removal/resection   Patient desires surgical management with resection of large vaginal skin tag.  The risks of surgery were discussed in detail with the patient including but not limited to: bleeding which may require transfusion or reoperation; infection which may require prolonged hospitalization or re-hospitalization and antibiotic therapy; injury to bowel, bladder, ureters and major vessels or other surrounding organs; need for additional procedures including laparotomy; thromboembolic phenomenon, incisional problems and other postoperative or anesthesia complications.  Patient was told that the likelihood that her condition and symptoms will be treated effectively with this surgical management was very high; the postoperative expectations were also discussed in detail. The patient also understands the alternative treatment options which were discussed in full. All questions were answered.  She was told that she will be contacted by our surgical scheduler regarding the time and date of her surgery; routine preoperative instructions of having nothing to eat or drink after midnight on the day prior to surgery and also coming to the hospital 1 1/2 hours prior to her time of surgery were also emphasized.  She was told she may be called for a preoperative appointment about a week prior to surgery and will be given further preoperative instructions at that visit. Printed patient education handouts about the procedure were given to the patient to review at home.  Emily Saunders, M.D., [facility]nn June

## 2019-02-06 NOTE — Progress Notes (Signed)
Pt is in the office for vaginal issue, referred from [facility]. Pt states issue started last month, she noticed a bulge after wiping, reports pressure.

## 2019-02-07 ENCOUNTER — Encounter: Payer: Self-pay | Admitting: Obstetrics & Gynecology

## 2019-02-11 ENCOUNTER — Ambulatory Visit: Payer: Medicaid Other | Admitting: Obstetrics

## 2019-02-22 ENCOUNTER — Encounter: Payer: Self-pay | Admitting: Obstetrics

## 2019-02-22 ENCOUNTER — Telehealth (INDEPENDENT_AMBULATORY_CARE_PROVIDER_SITE_OTHER): Payer: Medicaid Other | Admitting: Obstetrics

## 2019-02-22 ENCOUNTER — Other Ambulatory Visit: Payer: Self-pay

## 2019-02-22 DIAGNOSIS — I10 Essential (primary) hypertension: Secondary | ICD-10-CM

## 2019-02-22 DIAGNOSIS — Z3169 Encounter for other general counseling and advice on procreation: Secondary | ICD-10-CM

## 2019-02-22 DIAGNOSIS — N839 Noninflammatory disorder of ovary, fallopian tube and broad ligament, unspecified: Secondary | ICD-10-CM

## 2019-02-22 MED ORDER — CLOMIPHENE CITRATE 50 MG PO TABS: ORAL_TABLET | ORAL | 1 refills | Status: DC

## 2019-02-22 MED ORDER — VITAFOL ULTRA 29-0.6-0.4-200 MG PO CAPS: 1.0000 | ORAL_CAPSULE | Freq: Every day | ORAL | 11 refills | Status: DC

## 2019-02-22 MED ORDER — HYDROCHLOROTHIAZIDE 25 MG PO TABS: 25.0000 mg | ORAL_TABLET | Freq: Every day | ORAL | 11 refills | Status: AC

## 2019-02-22 MED ORDER — AMLODIPINE BESYLATE 10 MG PO TABS: 10.0000 mg | ORAL_TABLET | Freq: Every day | ORAL | 11 refills | Status: AC

## 2019-02-22 NOTE — Progress Notes (Signed)
   TELEHEALTH VIRTUAL GYNECOLOGY VISIT ENCOUNTER NOTE  I connected with [name] on 02/22/19 at 10:45 AM EST by telephone at home and verified that I am speaking with the correct person using two identifiers.   I discussed the limitations, risks, security and privacy concerns of performing an evaluation and management service by telephone and the availability of in person appointments. I also discussed with the patient that there may be a patient responsible charge related to this service. The patient expressed understanding and agreed to proceed.   History:  Emily Saunders is a 44 y.o. [phone] female being evaluated today for inability to get pregnant, despite trying for the past year. She denies any abnormal vaginal discharge, bleeding, pelvic pain or other concerns.       Past Medical History:  Diagnosis Date  . H/O pilonidal cyst 2002   . Hypertension   . PID (acute pelvic inflammatory disease) 2006   . Pneumonia   . Vitamin D deficiency 2015    Past Surgical History:  Procedure Laterality Date  . CESAREAN SECTION    . CYST EXCISION Right 2003   hand   The following portions of the patient's history were reviewed and updated as appropriate: allergies, current medications, past family history, past medical history, past social history, past surgical history and problem list.   Health Maintenance:  Normal pap and negative HRHPV on 08-23-2018.  Normal mammogram on 04-17-2018.   Review of Systems:  Pertinent items noted in HPI and remainder of comprehensive ROS otherwise negative.  Physical Exam:   General:  Alert, oriented and cooperative.   Mental Status: Normal mood and affect perceived. Normal judgment and thought content.  Physical exam deferred due to nature of the encounter  Labs and Imaging No results found for this or any previous visit (from the past 336 hour(s)). No results found.    Assessment and Plan:     1. Disorder of ovulation Rx: - clomiPHENE  (CLOMID) 50 MG tablet; One tablet po daily starting days 5 - 9 of cycle.  Dispense: 5 tablet; Refill: 1  2. Encounter for preconception consultation Rx: - Prenat-Fe Poly-Methfol-FA-DHA (VITAFOL ULTRA) 29-0.6-0.4-200 MG CAPS; Take 1 capsule by mouth daily before breakfast.  Dispense: 30 capsule; Refill: 11  3. Hypertension, unspecified type Rx: - amLODipine (NORVASC) 10 MG tablet; Take 1 tablet (10 mg total) by mouth daily.  Dispense: 30 tablet; Refill: 11 - hydrochlorothiazide (HYDRODIURIL) 25 MG tablet; Take 1 tablet (25 mg total) by mouth daily.  Dispense: 30 tablet; Refill: 11       I discussed the assessment and treatment plan with the patient. The patient was provided an opportunity to ask questions and all were answered. The patient agreed with the plan and demonstrated an understanding of the instructions.   The patient was advised to call back or seek an in-person evaluation/go to the ED if the symptoms worsen or if the condition fails to improve as anticipated.  I provided 15 minutes of non-face-to-face time during this encounter.   Baltazar Najjar, MD [facility], [place] Shelly Bombard, MD 02/22/2019 11:22 AM

## 2019-02-22 NOTE — Progress Notes (Signed)
Pt tried to do virtual visit, but keeps receiving error message, will do tele visit. Pt trying to get pregnant, wants to discuss with provider.

## 2019-02-26 ENCOUNTER — Ambulatory Visit (HOSPITAL_BASED_OUTPATIENT_CLINIC_OR_DEPARTMENT_OTHER): Admit: 2019-02-26 | Payer: Medicaid Other | Admitting: Obstetrics & Gynecology

## 2019-02-26 ENCOUNTER — Encounter (HOSPITAL_BASED_OUTPATIENT_CLINIC_OR_DEPARTMENT_OTHER): Payer: Self-pay

## 2019-02-26 SURGERY — EXCISION, LESION, VAGINA
Anesthesia: Choice

## 2019-03-14 DIAGNOSIS — G44209 Tension-type headache, unspecified, not intractable: Secondary | ICD-10-CM | POA: Diagnosis not present

## 2019-03-14 DIAGNOSIS — S134XXA Sprain of ligaments of cervical spine, initial encounter: Secondary | ICD-10-CM | POA: Diagnosis not present

## 2019-03-18 ENCOUNTER — Telehealth (INDEPENDENT_AMBULATORY_CARE_PROVIDER_SITE_OTHER): Payer: Medicaid Other | Admitting: Primary Care

## 2019-03-20 ENCOUNTER — Telehealth (INDEPENDENT_AMBULATORY_CARE_PROVIDER_SITE_OTHER): Payer: Medicaid Other | Admitting: Primary Care

## 2019-03-20 DIAGNOSIS — M542 Cervicalgia: Secondary | ICD-10-CM | POA: Diagnosis not present

## 2019-03-20 DIAGNOSIS — M62838 Other muscle spasm: Secondary | ICD-10-CM | POA: Diagnosis not present

## 2019-03-20 DIAGNOSIS — W19XXXD Unspecified fall, subsequent encounter: Secondary | ICD-10-CM

## 2019-03-20 DIAGNOSIS — M549 Dorsalgia, unspecified: Secondary | ICD-10-CM

## 2019-03-20 DIAGNOSIS — W001XXD Fall from stairs and steps due to ice and snow, subsequent encounter: Secondary | ICD-10-CM

## 2019-03-20 MED ORDER — TIZANIDINE HCL 4 MG PO TABS: 4.0000 mg | ORAL_TABLET | Freq: Four times a day (QID) | ORAL | 0 refills | Status: DC | PRN

## 2019-03-20 MED ORDER — IBUPROFEN 800 MG PO TABS: 600.0000 mg | ORAL_TABLET | Freq: Three times a day (TID) | ORAL | 1 refills | Status: DC | PRN

## 2019-03-20 NOTE — Progress Notes (Signed)
Virtual Visit via Telephone Note  I connected with Emily Saunders on 03/20/19 at 11:10 AM EST by telephone and verified that I am speaking with the correct person using two identifiers.   I discussed the limitations, risks, security and privacy concerns of performing an evaluation and management service by telephone and the availability of in person appointments. I also discussed with the patient that there may be a patient responsible charge related to this service. The patient expressed understanding and agreed to proceed.   History of Present Illness: Emily Saunders is having an acute visit status post fall last week black ice on her steps . She slides down 6 steps . Complaints of neck , head aches -went to urgent care and was told had whiplash and was prescribed sulindac and tizanidine and upper back continues to hurt. She denies taking ASA or anticoagulation medication.   Past Medical History:  Diagnosis Date  . H/O pilonidal cyst 2002   . Hypertension   . PID (acute pelvic inflammatory disease) 2006   . Pneumonia   . Vitamin D deficiency 2015    Current Outpatient Medications on File Prior to Visit  Medication Sig Dispense Refill  . amLODipine (NORVASC) 10 MG tablet Take 1 tablet (10 mg total) by mouth daily. 30 tablet 11  . atorvastatin (LIPITOR) 20 MG tablet Take 1 tablet (20 mg total) by mouth daily. (Patient not taking: Reported on 02/22/2019) 90 tablet 3  . Blood Pressure Monitoring (BLOOD PRESSURE KIT) DEVI 1 mL by Does not apply route 3 (three) times daily. (Patient not taking: Reported on 02/22/2019) 1 Bag 0  . clomiPHENE (CLOMID) 50 MG tablet One tablet po daily starting days 5 - 9 of cycle. 5 tablet 1  . hydrochlorothiazide (HYDRODIURIL) 25 MG tablet Take 1 tablet (25 mg total) by mouth daily. 30 tablet 11  . Prenat-Fe Poly-Methfol-FA-DHA (VITAFOL ULTRA) 29-0.6-0.4-200 MG CAPS Take 1 capsule by mouth daily before breakfast. 30 capsule 11  . tinidazole (TINDAMAX) 500 MG  tablet Take 2 tablets (1,000 mg total) by mouth daily with breakfast. (Patient not taking: Reported on 02/06/2019) 10 tablet 2   No current facility-administered medications on file prior to visit.   Observations/Objective: Review of Systems  Musculoskeletal: Positive for back pain, falls and neck pain.  All other systems reviewed and are negative.  Assessment and Plan: Fall, subsequent encounter Status post fall fell down step with black ice seen in urgent care continues to have head ache, joint are stiff and painful in the neck and back . Sent in ibuprofen 879m TID and tiZANidine [id number] prn for muscle spasms.  Denies any vision changes. In the event headaches become worst , change in vision go to emergency room for evaluation or if headache becomes worst pain she has ever had as a headache.   Follow Up Instructions:    I discussed the assessment and treatment plan with the patient. The patient was provided an opportunity to ask questions and all were answered. The patient agreed with the plan and demonstrated an understanding of the instructions.   The patient was advised to call back or seek an in-person evaluation if the symptoms worsen or if the condition fails to improve as anticipated.  I provided 10 minutes of non-face-to-face time during this encounter.   MiKerin PernaNP

## 2019-03-25 ENCOUNTER — Encounter: Payer: Self-pay | Admitting: Obstetrics

## 2019-03-25 ENCOUNTER — Ambulatory Visit: Payer: Medicaid Other | Admitting: Obstetrics

## 2019-03-25 ENCOUNTER — Other Ambulatory Visit (HOSPITAL_COMMUNITY)
Admission: RE | Admit: 2019-03-25 | Discharge: 2019-03-25 | Disposition: A | Discharge status: Home / Self Care | Payer: Medicaid Other | Source: Ambulatory Visit | Attending: Obstetrics | Admitting: Obstetrics

## 2019-03-25 ENCOUNTER — Other Ambulatory Visit: Payer: Self-pay

## 2019-03-25 VITALS — BP 142/96 | HR 93 | Wt 225.0 lb

## 2019-03-25 DIAGNOSIS — N926 Irregular menstruation, unspecified: Secondary | ICD-10-CM | POA: Diagnosis not present

## 2019-03-25 DIAGNOSIS — F17210 Nicotine dependence, cigarettes, uncomplicated: Secondary | ICD-10-CM | POA: Diagnosis not present

## 2019-03-25 DIAGNOSIS — N898 Other specified noninflammatory disorders of vagina: Secondary | ICD-10-CM

## 2019-03-25 NOTE — Progress Notes (Signed)
Having spotting some cramping

## 2019-03-25 NOTE — Progress Notes (Signed)
Patient ID: Emily Saunders, female   DOB: August 18, 1975, 44 y.o.   MRN: [id number]  Chief Complaint  Patient presents with  . Menstrual Problem    HPI Emily Saunders is a 44 y.o. female.  Irregular menstrual cycle after a fall. HPI  Past Medical History:  Diagnosis Date  . H/O pilonidal cyst 2002   . Hypertension   . PID (acute pelvic inflammatory disease) 2006   . Pneumonia   . Vitamin D deficiency 2015     Past Surgical History:  Procedure Laterality Date  . CESAREAN SECTION    . CYST EXCISION Right 2003   hand    Family History  Problem Relation Age of Onset  . Hypertension Mother   . Diabetes Mother   . Asthma Mother   . COPD Mother   . Depression Mother   . Miscarriages / [place] Mother   . Vision loss Mother   . Heart disease Mother   . Parkinson's disease Father   . Diabetes Maternal Aunt   . Diabetes Maternal Uncle   . Diabetes Cousin   . Breast cancer Maternal Grandmother   . Leukemia Sister     Social History Social History   Tobacco Use  . Smoking status: Current Every Day Smoker    Packs/day: 0.50    Years: 20.00    Pack years: 10.00    Types: Cigarettes  . Smokeless tobacco: Never Used  Substance Use Topics  . Alcohol use: No    Alcohol/week: 0.0 standard drinks  . Drug use: No    No Known Allergies  Current Outpatient Medications  Medication Sig Dispense Refill  . amLODipine (NORVASC) 10 MG tablet Take 1 tablet (10 mg total) by mouth daily. 30 tablet 11  . Prenat-Fe Poly-Methfol-FA-DHA (VITAFOL ULTRA) 29-0.6-0.4-200 MG CAPS Take 1 capsule by mouth daily before breakfast. 30 capsule 11  . atorvastatin (LIPITOR) 20 MG tablet Take 1 tablet (20 mg total) by mouth daily. (Patient not taking: Reported on 02/22/2019) 90 tablet 3  . Blood Pressure Monitoring (BLOOD PRESSURE KIT) DEVI 1 mL by Does not apply route 3 (three) times daily. (Patient not taking: Reported on 02/22/2019) 1 Bag 0  . clomiPHENE (CLOMID) 50 MG tablet One tablet po daily  starting days 5 - 9 of cycle. 5 tablet 1  . hydrochlorothiazide (HYDRODIURIL) 25 MG tablet Take 1 tablet (25 mg total) by mouth daily. 30 tablet 11  . ibuprofen (ADVIL) 800 MG tablet Take 1 tablet (800 mg total) by mouth every 8 (eight) hours as needed. 90 tablet 1  . tinidazole (TINDAMAX) 500 MG tablet Take 2 tablets (1,000 mg total) by mouth daily with breakfast. (Patient not taking: Reported on 02/06/2019) 10 tablet 2  . tiZANidine (ZANAFLEX) 4 MG tablet Take 1 tablet (4 mg total) by mouth every 6 (six) hours as needed for muscle spasms. 60 tablet 0   No current facility-administered medications for this visit.    Review of Systems Review of Systems Constitutional: negative for fatigue and weight loss Respiratory: negative for cough and wheezing Cardiovascular: negative for chest pain, fatigue and palpitations Gastrointestinal: negative for abdominal pain and change in bowel habits Genitourinary: positive for irregular menstrual cycle Integument/breast: negative for nipple discharge Musculoskeletal:negative for myalgias Neurological: negative for gait problems and tremors Behavioral/Psych: negative for abusive relationship, depression Endocrine: negative for temperature intolerance      Blood pressure (!) 142/96, pulse 93, weight 225 lb (102.1 kg), last menstrual period 02/18/2019.  Physical Exam Physical Exam  General:   alert  Skin:   no rash or abnormalities  Lungs:   clear to auscultation bilaterally  Heart:   regular rate and rhythm, S1, S2 normal, no murmur, click, rub or gallop  Breasts:   normal without suspicious masses, skin or nipple changes or axillary nodes  Abdomen:  normal findings: no organomegaly, soft, non-tender and no hernia  Pelvis:  External genitalia: normal general appearance Urinary system: urethral meatus normal and bladder without fullness, nontender Vaginal: normal without tenderness, induration or masses Cervix: normal appearance Adnexa: normal  bimanual exam Uterus: anteverted and non-tender, normal size    50% of 15 min visit spent on counseling and coordination of care.   Data Reviewed Labs  Assessment     1. Irregular menstrual cycle - monitor closely  2. Vaginal discharge Rx: - Cervicovaginal ancillary only  3. Tobacco dependence due to cigarettes - tobacco cessation with the aid of medication and behavioral modification recommended    Plan   Follow up in 5 months for Annual  No orders of the defined types were placed in this encounter.  No orders of the defined types were placed in this encounter.    Shelly Bombard, MD 03/25/2019 11:13 AM

## 2019-03-26 LAB — CERVICOVAGINAL ANCILLARY ONLY
Bacterial Vaginitis (gardnerella): POSITIVE — AB
Candida Glabrata: NEGATIVE
Candida Vaginitis: NEGATIVE
Chlamydia: NEGATIVE
Comment: NEGATIVE
Comment: NEGATIVE
Comment: NEGATIVE
Comment: NEGATIVE
Comment: NEGATIVE
Comment: NORMAL
Neisseria Gonorrhea: NEGATIVE
Trichomonas: NEGATIVE

## 2019-03-27 ENCOUNTER — Other Ambulatory Visit: Payer: Self-pay | Admitting: Obstetrics

## 2019-03-27 DIAGNOSIS — B9689 Other specified bacterial agents as the cause of diseases classified elsewhere: Secondary | ICD-10-CM

## 2019-03-27 DIAGNOSIS — N760 Acute vaginitis: Secondary | ICD-10-CM

## 2019-03-27 MED ORDER — SOLOSEC 2 G PO PACK: 1.0000 | PACK | Freq: Once | ORAL | 2 refills | Status: AC

## 2019-04-03 DIAGNOSIS — Z3200 Encounter for pregnancy test, result unknown: Secondary | ICD-10-CM | POA: Diagnosis not present

## 2019-04-17 ENCOUNTER — Encounter: Payer: Self-pay | Admitting: Obstetrics

## 2019-04-17 ENCOUNTER — Other Ambulatory Visit: Payer: Self-pay

## 2019-04-17 ENCOUNTER — Ambulatory Visit: Payer: Medicaid Other | Admitting: Obstetrics

## 2019-04-17 VITALS — BP 116/84 | HR 76 | Ht 66.0 in | Wt 225.0 lb

## 2019-04-17 DIAGNOSIS — E669 Obesity, unspecified: Secondary | ICD-10-CM

## 2019-04-17 DIAGNOSIS — F17210 Nicotine dependence, cigarettes, uncomplicated: Secondary | ICD-10-CM | POA: Diagnosis not present

## 2019-04-17 DIAGNOSIS — Z3202 Encounter for pregnancy test, result negative: Secondary | ICD-10-CM | POA: Diagnosis not present

## 2019-04-17 DIAGNOSIS — N912 Amenorrhea, unspecified: Secondary | ICD-10-CM

## 2019-04-17 LAB — POCT URINE PREGNANCY: Preg Test, Ur: NEGATIVE

## 2019-04-17 MED ORDER — PHENTERMINE HCL 37.5 MG PO CAPS: 37.5000 mg | ORAL_CAPSULE | ORAL | 2 refills | Status: DC

## 2019-04-17 NOTE — Progress Notes (Signed)
Patient ID: Emily Saunders, female   DOB: Feb 06, 1976, 44 y.o.   MRN: [id number]  Chief Complaint  Patient presents with  . Amenorrhea    HPI Emily Saunders is a 44 y.o. female.  No period since January 2021.  Negative subjective signs of pregnancy.  Contraception:  None HPI  Past Medical History:  Diagnosis Date  . H/O pilonidal cyst 2002   . Hypertension   . PID (acute pelvic inflammatory disease) 2006   . Pneumonia   . Vitamin D deficiency 2015     Past Surgical History:  Procedure Laterality Date  . CESAREAN SECTION    . CYST EXCISION Right 2003   hand    Family History  Problem Relation Age of Onset  . Hypertension Mother   . Diabetes Mother   . Asthma Mother   . COPD Mother   . Depression Mother   . Miscarriages / [place] Mother   . Vision loss Mother   . Heart disease Mother   . Parkinson's disease Father   . Diabetes Maternal Aunt   . Diabetes Maternal Uncle   . Diabetes Cousin   . Breast cancer Maternal Grandmother   . Leukemia Sister     Social History Social History   Tobacco Use  . Smoking status: Current Every Day Smoker    Packs/day: 0.50    Years: 20.00    Pack years: 10.00    Types: Cigarettes  . Smokeless tobacco: Never Used  Substance Use Topics  . Alcohol use: No    Alcohol/week: 0.0 standard drinks  . Drug use: No    No Known Allergies  Current Outpatient Medications  Medication Sig Dispense Refill  . amLODipine (NORVASC) 10 MG tablet Take 1 tablet (10 mg total) by mouth daily. 30 tablet 11  . clomiPHENE (CLOMID) 50 MG tablet One tablet po daily starting days 5 - 9 of cycle. 5 tablet 1  . hydrochlorothiazide (HYDRODIURIL) 25 MG tablet Take 1 tablet (25 mg total) by mouth daily. 30 tablet 11  . ibuprofen (ADVIL) 800 MG tablet Take 1 tablet (800 mg total) by mouth every 8 (eight) hours as needed. 90 tablet 1  . Prenat-Fe Poly-Methfol-FA-DHA (VITAFOL ULTRA) 29-0.6-0.4-200 MG CAPS Take 1 capsule by mouth daily before breakfast.  30 capsule 11  . tiZANidine (ZANAFLEX) 4 MG tablet Take 1 tablet (4 mg total) by mouth every 6 (six) hours as needed for muscle spasms. 60 tablet 0  . atorvastatin (LIPITOR) 20 MG tablet Take 1 tablet (20 mg total) by mouth daily. (Patient not taking: Reported on 02/22/2019) 90 tablet 3  . Blood Pressure Monitoring (BLOOD PRESSURE KIT) DEVI 1 mL by Does not apply route 3 (three) times daily. (Patient not taking: Reported on 02/22/2019) 1 Bag 0  . phentermine 37.5 MG capsule Take 1 capsule (37.5 mg total) by mouth every morning. 30 capsule 2  . tinidazole (TINDAMAX) 500 MG tablet Take 2 tablets (1,000 mg total) by mouth daily with breakfast. (Patient not taking: Reported on 02/06/2019) 10 tablet 2   No current facility-administered medications for this visit.    Review of Systems Review of Systems Constitutional: negative for fatigue and weight loss Respiratory: negative for cough and wheezing Cardiovascular: negative for chest pain, fatigue and palpitations Gastrointestinal: negative for abdominal pain and change in bowel habits Genitourinary:positive for no period since January 2021 Integument/breast: negative for nipple discharge Musculoskeletal:negative for myalgias Neurological: negative for gait problems and tremors Behavioral/Psych: negative for abusive relationship, depression Endocrine: negative  for temperature intolerance      Blood pressure 116/84, pulse 76, height 5' 6"  (1.676 m), weight 225 lb (102.1 kg), last menstrual period 02/18/2019.  Physical Exam Physical Exam:    General:  Alert and no distress Respiratory:  Normal respiratory effort Mental:  Normal affect and mood  The remainder of the physical exam deferred due to the lack of subjective signs of pregnancy and no other complaints.  50% of 15 min visit spent on counseling and coordination of care.   Data Reviewed UPT:  Negative  Assessment     1. Amenorrhea Rx: - POCT urine pregnancy:  Negative  2.  Tobacco dependence due to cigarettes - tobacco cessation with the aid of medication and behavioral modification recommended - Phentermine Rx  3. Obesity (BMI 35.0-39.9 without comorbidity) - program of caloric reduction, exercise and behavioral modification recommended    Plan    Follow up prn if no period when next cycle is due.  Will consider Provera withdrawal if no period next month  Orders Placed This Encounter  Procedures  . POCT urine pregnancy   Meds ordered this encounter  Medications  . phentermine 37.5 MG capsule    Sig: Take 1 capsule (37.5 mg total) by mouth every morning.    Dispense:  30 capsule    Refill:  2     Shelly Bombard, MD 04/17/2019 9:36 AM

## 2019-04-17 NOTE — Progress Notes (Signed)
RGYN patient presents for problem visit today.  Pt states she has not had a cycle since Jan 2021 Pt had little spotting when wiping.  Also states she will have sx's as she is about to start her cycle (cramping) but no period.    Pt denies any unprotected intercourse

## 2019-06-11 ENCOUNTER — Ambulatory Visit (HOSPITAL_BASED_OUTPATIENT_CLINIC_OR_DEPARTMENT_OTHER)
Admission: RE | Admit: 2019-06-11 | Payer: Medicaid Other | Source: Home / Self Care | Admitting: Obstetrics & Gynecology

## 2019-06-11 SURGERY — EXCISION, LESION, VAGINA
Anesthesia: Choice

## 2019-08-26 ENCOUNTER — Emergency Department (HOSPITAL_COMMUNITY)
Admission: EM | Admit: 2019-08-26 | Discharge: 2019-08-26 | Disposition: A | Discharge status: Home / Self Care | Payer: Medicaid Other | Attending: Emergency Medicine | Admitting: Emergency Medicine

## 2019-08-26 ENCOUNTER — Emergency Department (HOSPITAL_COMMUNITY): Payer: Medicaid Other

## 2019-08-26 ENCOUNTER — Other Ambulatory Visit: Payer: Self-pay

## 2019-08-26 ENCOUNTER — Encounter (HOSPITAL_COMMUNITY): Payer: Self-pay

## 2019-08-26 DIAGNOSIS — S79912A Unspecified injury of left hip, initial encounter: Secondary | ICD-10-CM | POA: Diagnosis not present

## 2019-08-26 DIAGNOSIS — I10 Essential (primary) hypertension: Secondary | ICD-10-CM | POA: Diagnosis not present

## 2019-08-26 DIAGNOSIS — W010XXA Fall on same level from slipping, tripping and stumbling without subsequent striking against object, initial encounter: Secondary | ICD-10-CM | POA: Diagnosis not present

## 2019-08-26 DIAGNOSIS — Y999 Unspecified external cause status: Secondary | ICD-10-CM | POA: Diagnosis not present

## 2019-08-26 DIAGNOSIS — Z79899 Other long term (current) drug therapy: Secondary | ICD-10-CM | POA: Insufficient documentation

## 2019-08-26 DIAGNOSIS — M1712 Unilateral primary osteoarthritis, left knee: Secondary | ICD-10-CM | POA: Diagnosis not present

## 2019-08-26 DIAGNOSIS — F17210 Nicotine dependence, cigarettes, uncomplicated: Secondary | ICD-10-CM | POA: Diagnosis not present

## 2019-08-26 DIAGNOSIS — M25562 Pain in left knee: Secondary | ICD-10-CM | POA: Diagnosis not present

## 2019-08-26 DIAGNOSIS — W19XXXA Unspecified fall, initial encounter: Secondary | ICD-10-CM

## 2019-08-26 DIAGNOSIS — Y9301 Activity, walking, marching and hiking: Secondary | ICD-10-CM | POA: Diagnosis not present

## 2019-08-26 DIAGNOSIS — R200 Anesthesia of skin: Secondary | ICD-10-CM | POA: Diagnosis not present

## 2019-08-26 DIAGNOSIS — Y9289 Other specified places as the place of occurrence of the external cause: Secondary | ICD-10-CM | POA: Insufficient documentation

## 2019-08-26 DIAGNOSIS — S8992XA Unspecified injury of left lower leg, initial encounter: Secondary | ICD-10-CM | POA: Diagnosis not present

## 2019-08-26 NOTE — Discharge Instructions (Addendum)
You can take 600 mg of ibuprofen every 6 hours, you can take 1000 mg of Tylenol every 6 hours, you can alternate these every 3 or you can take them together.  

## 2019-08-26 NOTE — ED Provider Notes (Signed)
[facility] DEPT Provider Note   CSN: [id number] Arrival date & time: 08/26/19  1528     History Chief Complaint  Patient presents with  . Fall  . Knee Pain  . Hip Pain    Emily Saunders is a 44 y.o. female.   Fall This is a new problem. The current episode started 3 to 5 hours ago. The problem occurs constantly. The problem has not changed since onset.Pertinent negatives include no chest pain, no headaches and no shortness of breath. Exacerbated by: ambulating. The symptoms are relieved by rest.  Knee Pain Associated symptoms: no back pain and no fever   Hip Pain Pertinent negatives include no chest pain, no headaches and no shortness of breath.       Past Medical History:  Diagnosis Date  . H/O pilonidal cyst 2002   . Hypertension   . PID (acute pelvic inflammatory disease) 2006   . Pneumonia   . Vitamin D deficiency 2015     Patient Active Problem List   Diagnosis Date Noted  . HTN (hypertension) 02/27/2015  . Unintended weight loss 10/27/2014  . Allergic rhinitis 06/23/2014  . Elevated BP 06/23/2014  . Vitamin D insufficiency 03/03/2014  . Pilonidal cyst 03/03/2014  . Broken teeth 03/03/2014  . Poor sleep pattern 03/03/2014  . Obesity (BMI 30-39.9) 03/03/2014  . Current smoker 03/03/2014  . Anemia 05/27/2013    Past Surgical History:  Procedure Laterality Date  . CESAREAN SECTION    . CYST EXCISION Right 2003   hand     OB History    Gravida  6   Para  4   Term  4   Preterm      AB  2   Living  4     SAB  1   TAB      Ectopic  1   Multiple  0   Live Births  4           Family History  Problem Relation Age of Onset  . Hypertension Mother   . Diabetes Mother   . Asthma Mother   . COPD Mother   . Depression Mother   . Miscarriages / [place] Mother   . Vision loss Mother   . Heart disease Mother   . Parkinson's disease Father   . Diabetes Maternal Aunt   . Diabetes Maternal Uncle     . Diabetes Cousin   . Breast cancer Maternal Grandmother   . Leukemia Sister     Social History   Tobacco Use  . Smoking status: Current Every Day Smoker    Packs/day: 0.50    Years: 20.00    Pack years: 10.00    Types: Cigarettes  . Smokeless tobacco: Never Used  Vaping Use  . Vaping Use: Never used  Substance Use Topics  . Alcohol use: No    Alcohol/week: 0.0 standard drinks  . Drug use: No    Home Medications Prior to Admission medications   Medication Sig Start Date End Date Taking? Authorizing Provider  amLODipine (NORVASC) 10 MG tablet Take 1 tablet (10 mg total) by mouth daily. 02/22/19   Shelly Bombard, MD  atorvastatin (LIPITOR) 20 MG tablet Take 1 tablet (20 mg total) by mouth daily. Patient not taking: Reported on 02/22/2019 11/27/18   [name], NP  Blood Pressure Monitoring (BLOOD PRESSURE KIT) DEVI 1 mL by Does not apply route 3 (three) times daily. Patient not taking: Reported on  02/22/2019 11/30/18   [name], NP  clomiPHENE (CLOMID) 50 MG tablet One tablet po daily starting days 5 - 9 of cycle. 02/22/19   Shelly Bombard, MD  hydrochlorothiazide (HYDRODIURIL) 25 MG tablet Take 1 tablet (25 mg total) by mouth daily. 02/22/19   Shelly Bombard, MD  ibuprofen (ADVIL) 800 MG tablet Take 1 tablet (800 mg total) by mouth every 8 (eight) hours as needed. 03/20/19   [name], NP  phentermine 37.5 MG capsule Take 1 capsule (37.5 mg total) by mouth every morning. 04/17/19   Shelly Bombard, MD  Prenat-Fe Poly-Methfol-FA-DHA (VITAFOL ULTRA) 29-0.6-0.4-200 MG CAPS Take 1 capsule by mouth daily before breakfast. 02/22/19   Shelly Bombard, MD  tinidazole (TINDAMAX) 500 MG tablet Take 2 tablets (1,000 mg total) by mouth daily with breakfast. Patient not taking: Reported on 02/06/2019 08/28/18   Shelly Bombard, MD  tiZANidine (ZANAFLEX) 4 MG tablet Take 1 tablet (4 mg total) by mouth every 6 (six) hours as needed for muscle spasms. 03/20/19    [name], NP    Allergies    Patient has no known allergies.  Review of Systems   Review of Systems  Constitutional: Negative for chills and fever.  HENT: Negative for congestion and rhinorrhea.   Respiratory: Negative for cough and shortness of breath.   Cardiovascular: Negative for chest pain and palpitations.  Gastrointestinal: Negative for diarrhea, nausea and vomiting.  Genitourinary: Negative for difficulty urinating and dysuria.  Musculoskeletal: Positive for arthralgias. Negative for back pain.  Skin: Positive for wound (abrasion of the knee). Negative for rash.  Neurological: Negative for light-headedness and headaches.    Physical Exam Updated Vital Signs BP 128/80 (BP Location: Right Arm)   Pulse 80   Temp 98.2 F (36.8 C) (Oral)   Resp 16   Ht 5' 6"  (1.676 m)   Wt 99.8 kg   LMP 08/26/2019   SpO2 100%   BMI 35.51 kg/m   Physical Exam Vitals and nursing note reviewed. Exam conducted with a chaperone present.  Constitutional:      General: She is not in acute distress.    Appearance: Normal appearance.  HENT:     Head: Normocephalic and atraumatic.     Nose: No rhinorrhea.  Eyes:     General:        Right eye: No discharge.        Left eye: No discharge.     Conjunctiva/sclera: Conjunctivae normal.  Cardiovascular:     Rate and Rhythm: Normal rate. Rhythm irregular.     Comments: 2+ dorsalis pedis in the left lower extremity Pulmonary:     Effort: Pulmonary effort is normal. No respiratory distress.     Breath sounds: No stridor.  Abdominal:     General: Abdomen is flat. There is no distension.     Palpations: Abdomen is soft.  Musculoskeletal:        General: Tenderness and signs of injury present.     Comments: Tenderness to the medial and superiorly, no crepitus bony deformity or significant effusion, mild abrasion to the medial superior portion of the skin overlying the patella.  Decreased range of motion due to pain.  Normal range of  motion of the hip no tenderness to palpation.  Skin:    General: Skin is warm and dry.  Neurological:     General: No focal deficit present.     Mental Status: She is alert. Mental status is at  baseline.     Motor: No weakness.     Comments: Patient motor function intact in left lower extremity  Psychiatric:        Mood and Affect: Mood normal.        Behavior: Behavior normal.     ED Results / Procedures / Treatments   Labs (all labs ordered are listed, but only abnormal results are displayed) Labs Reviewed - No data to display  EKG None  Radiology DG Knee Complete 4 Views Left  Result Date: 08/26/2019 CLINICAL DATA:  Fall, pain radiating to lower leg, toe numbness EXAM: LEFT KNEE - COMPLETE 4+ VIEW COMPARISON:  None. FINDINGS: No acute bony abnormality. Specifically, no fracture, subluxation, or dislocation. Minimal tricompartmental degenerative changes with periarticular spurring and spurring upon the tibial spines. Enthesopathic tug lesion along the lateral aspect of the tibial metadiaphysis with focal cortical thickening. No visible joint effusion the lateral projection is suboptimal with slight obliquity. Soft tissues are unremarkable. IMPRESSION: 1. No acute osseous abnormality. 2. Minimal tricompartmental degenerative changes. Electronically Signed   By: Lovena Le M.D.   On: 08/26/2019 18:22   DG Hip Unilat With Pelvis 2-3 Views Left  Result Date: 08/26/2019 CLINICAL DATA:  44 year old female with fall and left hip pain. EXAM: DG HIP (WITH OR WITHOUT PELVIS) 2-3V LEFT COMPARISON:  None. FINDINGS: There is no evidence of hip fracture or dislocation. There is no evidence of arthropathy or other focal bone abnormality. IMPRESSION: Negative. Electronically Signed   By: Anner Crete M.D.   On: 08/26/2019 18:24    Procedures Procedures (including critical care time)  Medications Ordered in ED Medications - No data to display  ED Course  I have reviewed the triage vital  signs and the nursing notes.  Pertinent labs & imaging results that were available during my care of the patient were reviewed by me and considered in my medical decision making (see chart for details).    MDM Rules/Calculators/A&P                          Fall from standing while ambulating, landed on her knee.  X-rays are negative after radiology review in my review.  The patient will be given knee immobilizer and crutches as there may be an occult injury.  She will follow up in 1 week.  Over-the-counter anti-inflammatories and Tylenol are recommended.  Rest ice elevation are recommended as well.  No other injury found reported.  Patient is doing well otherwise safe for discharge home.  She agrees this plan for reevaluation 1 week.  Strict return precautions are given.  Final Clinical Impression(s) / ED Diagnoses Final diagnoses:  Fall, initial encounter  Acute pain of left knee    Rx / DC Orders ED Discharge Orders    None       Breck Coons, MD 08/26/19 [id number]

## 2019-08-26 NOTE — ED Triage Notes (Addendum)
Patient states she was walking down a wet ramp and slipped. Patient c/o left knee and left hip pain. Patient denies hitting her head or LOC.

## 2019-08-27 ENCOUNTER — Telehealth: Payer: Self-pay | Admitting: *Deleted

## 2019-08-27 NOTE — Telephone Encounter (Signed)
attempted to contact pt to complete transition of care assessment; left message on voicemail.  Lenor Coffin, RN, BSN, [place] Patient [place] [phone]

## 2019-08-27 NOTE — Telephone Encounter (Signed)
Returned pt's call to complete transition of care assessment:  Transition Care Management Follow-up Telephone Call  . Medicaid Managed Care Transition Call Status:MM [facility] Call Made  . Date of discharge and from where:[facility], 08/26/19  . How have you been since you were released from the hospital? ok  . Any questions or concerns? Knee stabilizer won't stay on Items Reviewed: Marland Kitchen Did the pt receive and understand the discharge instructions provided? Yes  . Medications obtained and verified? No  . Any new allergies since your discharge? No  . Dietary orders reviewed?no, not listed on instruction . Do you have support at home? Yes  Functional Questionnaire: (I = Independent and D = Dependent)  ADLs: Dependent Bathing/Dressing:Independent Meal Prep: Independent Eating: Independent Maintaining continence: Independent Transferring/Ambulation: Independent Managing Meds: Independent Follow up appointments reviewed:  PCP Hospital f/u appt confirmed? No pt states she will contact Juluis Mire on 08/27/19, [place] for appt [facility] f/u appt confirmed? N/a  Are transportation arrangements needed? Yes   If their condition worsens, is the pt aware to call PCP or go to the EmergencyDept.? Yes  Was the patient provided with contact information for the PCP's office or ED? yes  Was to pt encouraged to call back with questions or concerns? Yes  Lenor Coffin, RN, BSN, [place] Patient [place] [phone]

## 2019-09-02 ENCOUNTER — Other Ambulatory Visit: Payer: Self-pay | Admitting: Oral Surgery

## 2019-09-02 DIAGNOSIS — D492 Neoplasm of unspecified behavior of bone, soft tissue, and skin: Secondary | ICD-10-CM

## 2019-09-03 ENCOUNTER — Ambulatory Visit (INDEPENDENT_AMBULATORY_CARE_PROVIDER_SITE_OTHER): Payer: Medicaid Other | Admitting: Primary Care

## 2019-09-03 ENCOUNTER — Other Ambulatory Visit: Payer: Self-pay

## 2019-09-03 ENCOUNTER — Encounter (INDEPENDENT_AMBULATORY_CARE_PROVIDER_SITE_OTHER): Payer: Self-pay | Admitting: Primary Care

## 2019-09-03 VITALS — BP 126/83 | HR 78 | Temp 98.2°F | Ht 66.0 in | Wt 224.6 lb

## 2019-09-03 DIAGNOSIS — Z09 Encounter for follow-up examination after completed treatment for conditions other than malignant neoplasm: Secondary | ICD-10-CM

## 2019-09-03 DIAGNOSIS — L0591 Pilonidal cyst without abscess: Secondary | ICD-10-CM

## 2019-09-03 DIAGNOSIS — W102XXS Fall (on)(from) incline, sequela: Secondary | ICD-10-CM | POA: Diagnosis not present

## 2019-09-03 DIAGNOSIS — I10 Essential (primary) hypertension: Secondary | ICD-10-CM

## 2019-09-03 NOTE — Progress Notes (Signed)
Assessment and Plan: Emily Saunders was seen today for hospitalization follow-up, cyst and sinus problem.  Diagnoses and all orders for this visit:  Hospital discharge follow-up Status post fall in a knee brace that can not be made to stay in place.   Essential hypertension Blood pressure is at  goal of less than 130/80, low-sodium, DASH diet, medication compliance, 150 minutes of moderate intensity exercise per week. Continue on amlodipine 46m and HCTZ 238m  Fall (on)(from) incline, sequela -     Ambulatory referral to Orthopedic Surgery  Pilonidal cyst Information place on AVS treatment and options  HPI Ms.  Emily Saunders [age].o.female presents for follow up from emergency room visit on August 26, 2019 she was seen for a fall she admits to crashing and it was raining that caused her to lose her balance slipped and fall.  She is having difficulty mobilizing her knee in the brace she has even gone back for assistance and the brace will not stay in place will refer Ortho for further assistance  Past Medical History:  Diagnosis Date  . H/O pilonidal cyst 2002   . Hypertension   . PID (acute pelvic inflammatory disease) 2006   . Pneumonia   . Vitamin D deficiency 2015      No Known Allergies    Current Outpatient Medications on File Prior to Visit  Medication Sig Dispense Refill  . amLODipine (NORVASC) 10 MG tablet Take 1 tablet (10 mg total) by mouth daily. 30 tablet 11  . hydrochlorothiazide (HYDRODIURIL) 25 MG tablet Take 1 tablet (25 mg total) by mouth daily. 30 tablet 11  . ibuprofen (ADVIL) 800 MG tablet Take 1 tablet (800 mg total) by mouth every 8 (eight) hours as needed. 90 tablet 1  . atorvastatin (LIPITOR) 20 MG tablet Take 1 tablet (20 mg total) by mouth daily. (Patient not taking: Reported on 02/22/2019) 90 tablet 3  . Blood Pressure Monitoring (BLOOD PRESSURE KIT) DEVI 1 mL by Does not apply route 3 (three) times daily. (Patient not taking: Reported on 02/22/2019) 1 Bag 0   . clomiPHENE (CLOMID) 50 MG tablet One tablet po daily starting days 5 - 9 of cycle. 5 tablet 1  . phentermine 37.5 MG capsule Take 1 capsule (37.5 mg total) by mouth every morning. (Patient not taking: Reported on 09/03/2019) 30 capsule 2  . Prenat-Fe Poly-Methfol-FA-DHA (VITAFOL ULTRA) 29-0.6-0.4-200 MG CAPS Take 1 capsule by mouth daily before breakfast. 30 capsule 11  . tinidazole (TINDAMAX) 500 MG tablet Take 2 tablets (1,000 mg total) by mouth daily with breakfast. (Patient not taking: Reported on 02/06/2019) 10 tablet 2   No current facility-administered medications on file prior to visit.    ROS: all negative except above.   Physical Exam: Filed Weights   09/03/19 1541  Weight: (!) 224 lb 9.6 oz (101.9 kg)   BP 126/83 (BP Location: Left Arm, Patient Position: Sitting, Cuff Size: Normal)   Pulse 78   Temp 98.2 F (36.8 C) (Oral)   Ht 5' 6"  (1.676 m)   Wt (!) 224 lb 9.6 oz (101.9 kg)   LMP 08/26/2019   SpO2 98%   BMI 36.25 kg/m  General Appearance: Well nourished, in no apparent distress. Eyes: PERRLA, EOMs, conjunctiva no swelling or erythema Sinuses:  Neck: Supple, thyroid normal.  Respiratory: Respiratory effort normal, BS equal bilaterally without rales, rhonchi, wheezing or stridor.  Cardio: RRR with no MRGs. Brisk peripheral pulses without edema.  Abdomen: Soft, + BS.  Non tender,  no guarding, rebound, hernias, masses. Lymphatics: Non tender without lymphadenopathy.  Musculoskeletal: abnormal gait. Due to brace increase risk for falls Skin: Warm, dry without rashes, lesions, ecchymosis.  Neuro: Cranial nerves intact. Normal muscle tone, no cerebellar symptoms. Sensation intact.  Psych: Awake and oriented X 3, normal affect, Insight and Judgment appropriate.     [name], NP 3:47 PM

## 2019-09-03 NOTE — Patient Instructions (Addendum)
Pilonidal cyst  How is a pilonidal cyst treated?--If you have a pilonidal cyst without any symptoms, it probably does not need treatment. If the cyst is causing symptoms, treatment usually involves either draining the cyst or removing it with surgery. Draining a cyst can usually be done at the doctor's office. To drain a cyst, a doctor or nurse will first numb the area. Then they can cut open the cyst, drain it, and wash it out. In some cases, the doctor or nurse might also pack the empty cyst with gauze, or leave a drain in place. After the cut has healed, you should regularly remove the hair from the area. You can do this by shaving carefully or using a hair-removal product such as Carlton Adam. This might help prevent the pilonidal cyst from causing symptoms again.  Removing a cyst involves surgery, so it is done in an operating room at the hospital. Right before the surgery, people having the surgery either get a shot to numb the area, or a shot to numb the area plus some medicine to make them drowsy. People having the surgery can usually go home the same day. The wound might be closed or left open. You will need to see your doctor regularly after surgery to check how the area is healing.

## 2019-09-11 ENCOUNTER — Ambulatory Visit (INDEPENDENT_AMBULATORY_CARE_PROVIDER_SITE_OTHER): Payer: Medicaid Other | Admitting: Orthopaedic Surgery

## 2019-09-11 ENCOUNTER — Encounter: Payer: Self-pay | Admitting: Orthopaedic Surgery

## 2019-09-11 ENCOUNTER — Other Ambulatory Visit: Payer: Self-pay

## 2019-09-11 DIAGNOSIS — M25562 Pain in left knee: Secondary | ICD-10-CM

## 2019-09-11 NOTE — Progress Notes (Signed)
Office Visit Note   Patient: Emily Saunders           Date of Birth: Feb 10, 1975           MRN: [id number] Visit Date: 09/11/2019              Requested by: [name], NP [address] [place],  [place] [address] PCP: [name], NP   Assessment & Plan: Visit Diagnoses:  1. Acute pain of left knee     Plan: Mrs. Defranco experienced rather acute onset of left knee pain on August 26, 2019.  She was walking down a ramp at her his daughter's home when she slipped and fell directly on the anterior aspect of her left knee.  At first she "felt fine" but several hours later her knee swelled and she had difficulty walking.  She was seen at the emergency room at [facility] with x-rays negative for any acute changes and only mild degenerative arthritis.  She was placed in a knee immobilizer and instructed use of over-the-counter medicines.  She notes that now she occasionally has some swelling but no giving way.  She is sleeping "okay".  She walks without a limp and feeling better.  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  Her exam was consistent with possible mild strain of the femoral attachment of the MCL.  There is no effusion.  No significant joint pain.  No specific treatment at this point.  I would like her to return in the next 4 to 6 weeks for reevaluation if she is still having a problem.  She had a very small abrasion of her knee that has healed and probably had a hematoma.  I do not see any evidence of internal derangement.  Follow-Up Instructions: Return if symptoms worsen or fail to improve.   Orders:  No orders of the defined types were placed in this encounter.  No orders of the defined types were placed in this encounter.     Procedures: No procedures performed   Clinical Data: No additional findings.   Subjective: Chief Complaint  Patient presents with  . Left Knee - Pain, Injury    DOI 08/26/2019  Patient presents today for  left knee pain. She fell and landed on her left knee on 08/26/2019 as she was rushing down a ramp in the rain. Her pain has since went away, except occasional pain at night if she turns wrong while lying in the bed. She states that her knee will pop when she stretches her legs out, but does not hurt doing so. She went to the ED at [facility] when she fell and had x-rays taken.  X-rays were negative for any acute changes.  She notes that presently she has occasional swelling but no feeling of her knee giving way.  She does not have any limitations of her activities of daily living  HPI  Review of Systems   Objective: Vital Signs: Ht 5\' 6"  (1.676 m)   Wt 224 lb (101.6 kg)   LMP 08/26/2019   BMI 36.15 kg/m   Physical Exam Constitutional:      Appearance: She is well-developed.  Eyes:     Pupils: Pupils are equal, round, and reactive to light.  Pulmonary:     Effort: Pulmonary effort is normal.  Skin:    General: Skin is warm and dry.  Neurological:     Mental Status: She is alert and  oriented to person, place, and time.  Psychiatric:        Behavior: Behavior normal.     Ortho Exam awake alert and oriented x3.  Comfortable sitting walks without use of ambulatory aid.  Left knee without effusion.  Has a healed abrasion anteriorly from her recent accident.  Some tenderness over the femoral attachment of the MCL but no opening with varus valgus stress.  Negative anterior drawer sign.  Negative Lachman's test.  Full extension and flexed over 100 degrees.  No popliteal pain or mass.  No calf pain.  No distal edema.  Painless range of motion of both hips.  No localized tenderness about either hip. straight leg raise negative  Specialty Comments:  No specialty comments available.  Imaging: No results found.   PMFS History: Patient Active Problem List   Diagnosis Date Noted  . Pain in left knee 09/11/2019  . HTN (hypertension) 02/27/2015  . Unintended weight loss 10/27/2014  .  Allergic rhinitis 06/23/2014  . Elevated BP 06/23/2014  . Vitamin D insufficiency 03/03/2014  . Pilonidal cyst 03/03/2014  . Broken teeth 03/03/2014  . Poor sleep pattern 03/03/2014  . Obesity (BMI 30-39.9) 03/03/2014  . Current smoker 03/03/2014  . Anemia 05/27/2013   Past Medical History:  Diagnosis Date  . H/O pilonidal cyst 2002   . Hypertension   . PID (acute pelvic inflammatory disease) 2006   . Pneumonia   . Vitamin D deficiency 2015     Family History  Problem Relation Age of Onset  . Hypertension Mother   . Diabetes Mother   . Asthma Mother   . COPD Mother   . Depression Mother   . Miscarriages / [place] Mother   . Vision loss Mother   . Heart disease Mother   . Parkinson's disease Father   . Diabetes Maternal Aunt   . Diabetes Maternal Uncle   . Diabetes Cousin   . Breast cancer Maternal Grandmother   . Leukemia Sister     Past Surgical History:  Procedure Laterality Date  . CESAREAN SECTION    . CYST EXCISION Right 2003   hand   Social History   Occupational History  . Not on file  Tobacco Use  . Smoking status: Current Every Day Smoker    Packs/day: 0.50    Years: 20.00    Pack years: 10.00    Types: Cigarettes  . Smokeless tobacco: Never Used  Vaping Use  . Vaping Use: Never used  Substance and Sexual Activity  . Alcohol use: No    Alcohol/week: 0.0 standard drinks  . Drug use: No  . Sexual activity: Yes    Partners: Male    Birth control/protection: None

## 2019-09-16 ENCOUNTER — Other Ambulatory Visit: Payer: Medicaid Other

## 2019-10-15 ENCOUNTER — Telehealth (INDEPENDENT_AMBULATORY_CARE_PROVIDER_SITE_OTHER): Payer: Self-pay | Admitting: Primary Care

## 2019-10-15 NOTE — Telephone Encounter (Signed)
Left patient a message informing that form has not been completed. PCP to work on form tomorrow and patient will receive a call as soon as form is ready for pick up.

## 2019-10-15 NOTE — Telephone Encounter (Signed)
Patient called to check the status of a consent form that clears her to work out.  Please advise and let patient know if the form has been completed.  CB# [phone]

## 2019-10-31 DIAGNOSIS — K439 Ventral hernia without obstruction or gangrene: Secondary | ICD-10-CM | POA: Diagnosis not present

## 2019-10-31 DIAGNOSIS — E669 Obesity, unspecified: Secondary | ICD-10-CM | POA: Diagnosis not present

## 2019-11-14 DIAGNOSIS — J3489 Other specified disorders of nose and nasal sinuses: Secondary | ICD-10-CM | POA: Diagnosis not present

## 2019-11-14 DIAGNOSIS — R519 Headache, unspecified: Secondary | ICD-10-CM | POA: Diagnosis not present

## 2019-11-14 DIAGNOSIS — K439 Ventral hernia without obstruction or gangrene: Secondary | ICD-10-CM | POA: Diagnosis not present

## 2019-11-14 DIAGNOSIS — Z20822 Contact with and (suspected) exposure to covid-19: Secondary | ICD-10-CM | POA: Diagnosis not present

## 2019-11-28 DIAGNOSIS — K8020 Calculus of gallbladder without cholecystitis without obstruction: Secondary | ICD-10-CM | POA: Diagnosis not present

## 2019-11-28 DIAGNOSIS — R519 Headache, unspecified: Secondary | ICD-10-CM | POA: Diagnosis not present

## 2019-11-28 DIAGNOSIS — E669 Obesity, unspecified: Secondary | ICD-10-CM | POA: Diagnosis not present

## 2019-12-04 ENCOUNTER — Ambulatory Visit (INDEPENDENT_AMBULATORY_CARE_PROVIDER_SITE_OTHER): Payer: Medicaid Other | Admitting: Primary Care

## 2019-12-10 DIAGNOSIS — K219 Gastro-esophageal reflux disease without esophagitis: Secondary | ICD-10-CM | POA: Diagnosis not present

## 2019-12-10 DIAGNOSIS — K8080 Other cholelithiasis without obstruction: Secondary | ICD-10-CM | POA: Diagnosis not present

## 2019-12-10 DIAGNOSIS — R140 Abdominal distension (gaseous): Secondary | ICD-10-CM | POA: Diagnosis not present

## 2019-12-10 DIAGNOSIS — R160 Hepatomegaly, not elsewhere classified: Secondary | ICD-10-CM | POA: Diagnosis not present

## 2019-12-13 DIAGNOSIS — J320 Chronic maxillary sinusitis: Secondary | ICD-10-CM | POA: Diagnosis not present

## 2019-12-13 DIAGNOSIS — J342 Deviated nasal septum: Secondary | ICD-10-CM | POA: Diagnosis not present

## 2019-12-13 DIAGNOSIS — R0982 Postnasal drip: Secondary | ICD-10-CM | POA: Diagnosis not present

## 2019-12-13 DIAGNOSIS — J3489 Other specified disorders of nose and nasal sinuses: Secondary | ICD-10-CM | POA: Diagnosis not present

## 2019-12-17 DIAGNOSIS — Z1211 Encounter for screening for malignant neoplasm of colon: Secondary | ICD-10-CM | POA: Diagnosis not present

## 2019-12-17 DIAGNOSIS — Z01818 Encounter for other preprocedural examination: Secondary | ICD-10-CM | POA: Diagnosis not present

## 2019-12-17 DIAGNOSIS — K219 Gastro-esophageal reflux disease without esophagitis: Secondary | ICD-10-CM | POA: Diagnosis not present

## 2019-12-22 DIAGNOSIS — K3189 Other diseases of stomach and duodenum: Secondary | ICD-10-CM | POA: Diagnosis not present

## 2019-12-22 DIAGNOSIS — D132 Benign neoplasm of duodenum: Secondary | ICD-10-CM | POA: Diagnosis not present

## 2019-12-22 DIAGNOSIS — K900 Celiac disease: Secondary | ICD-10-CM | POA: Diagnosis not present

## 2019-12-23 DIAGNOSIS — A048 Other specified bacterial intestinal infections: Secondary | ICD-10-CM | POA: Diagnosis not present

## 2019-12-23 DIAGNOSIS — K200 Eosinophilic esophagitis: Secondary | ICD-10-CM | POA: Diagnosis not present

## 2019-12-23 DIAGNOSIS — K2970 Gastritis, unspecified, without bleeding: Secondary | ICD-10-CM | POA: Diagnosis not present

## 2019-12-23 DIAGNOSIS — D369 Benign neoplasm, unspecified site: Secondary | ICD-10-CM | POA: Diagnosis not present

## 2019-12-25 ENCOUNTER — Encounter (INDEPENDENT_AMBULATORY_CARE_PROVIDER_SITE_OTHER): Payer: Medicaid Other | Admitting: Ophthalmology

## 2019-12-25 ENCOUNTER — Other Ambulatory Visit: Payer: Self-pay

## 2019-12-25 DIAGNOSIS — I10 Essential (primary) hypertension: Secondary | ICD-10-CM

## 2019-12-25 DIAGNOSIS — H35033 Hypertensive retinopathy, bilateral: Secondary | ICD-10-CM

## 2019-12-26 ENCOUNTER — Ambulatory Visit: Payer: Medicaid Other | Admitting: Registered"

## 2019-12-28 DIAGNOSIS — R5381 Other malaise: Secondary | ICD-10-CM | POA: Diagnosis not present

## 2019-12-28 DIAGNOSIS — E559 Vitamin D deficiency, unspecified: Secondary | ICD-10-CM | POA: Diagnosis not present

## 2019-12-28 DIAGNOSIS — R768 Other specified abnormal immunological findings in serum: Secondary | ICD-10-CM | POA: Diagnosis not present

## 2019-12-28 DIAGNOSIS — Z131 Encounter for screening for diabetes mellitus: Secondary | ICD-10-CM | POA: Diagnosis not present

## 2019-12-28 DIAGNOSIS — Z79899 Other long term (current) drug therapy: Secondary | ICD-10-CM | POA: Diagnosis not present

## 2019-12-28 DIAGNOSIS — E8881 Metabolic syndrome: Secondary | ICD-10-CM | POA: Diagnosis not present

## 2020-01-01 DIAGNOSIS — H5213 Myopia, bilateral: Secondary | ICD-10-CM | POA: Diagnosis not present

## 2020-01-07 DIAGNOSIS — R768 Other specified abnormal immunological findings in serum: Secondary | ICD-10-CM | POA: Diagnosis not present

## 2020-01-07 DIAGNOSIS — R12 Heartburn: Secondary | ICD-10-CM | POA: Diagnosis not present

## 2020-01-07 DIAGNOSIS — K5909 Other constipation: Secondary | ICD-10-CM | POA: Diagnosis not present

## 2020-01-12 DIAGNOSIS — L03311 Cellulitis of abdominal wall: Secondary | ICD-10-CM | POA: Diagnosis not present

## 2020-01-12 DIAGNOSIS — F17210 Nicotine dependence, cigarettes, uncomplicated: Secondary | ICD-10-CM | POA: Diagnosis not present

## 2020-01-12 DIAGNOSIS — Z6834 Body mass index (BMI) 34.0-34.9, adult: Secondary | ICD-10-CM | POA: Diagnosis not present

## 2020-01-25 DIAGNOSIS — E559 Vitamin D deficiency, unspecified: Secondary | ICD-10-CM | POA: Diagnosis not present

## 2020-01-25 DIAGNOSIS — R7303 Prediabetes: Secondary | ICD-10-CM | POA: Diagnosis not present

## 2020-01-25 DIAGNOSIS — N926 Irregular menstruation, unspecified: Secondary | ICD-10-CM | POA: Diagnosis not present

## 2020-01-25 DIAGNOSIS — F17210 Nicotine dependence, cigarettes, uncomplicated: Secondary | ICD-10-CM | POA: Diagnosis not present

## 2020-01-25 DIAGNOSIS — E781 Pure hyperglyceridemia: Secondary | ICD-10-CM | POA: Diagnosis not present

## 2020-01-25 DIAGNOSIS — R635 Abnormal weight gain: Secondary | ICD-10-CM | POA: Diagnosis not present

## 2020-01-27 ENCOUNTER — Ambulatory Visit (INDEPENDENT_AMBULATORY_CARE_PROVIDER_SITE_OTHER): Payer: Medicaid Other | Admitting: Obstetrics and Gynecology

## 2020-01-27 ENCOUNTER — Encounter: Payer: Self-pay | Admitting: Obstetrics and Gynecology

## 2020-01-27 ENCOUNTER — Other Ambulatory Visit: Payer: Self-pay

## 2020-01-27 VITALS — BP 129/83 | HR 88 | Temp 98.6°F | Ht 66.0 in | Wt 212.0 lb

## 2020-01-27 DIAGNOSIS — N814 Uterovaginal prolapse, unspecified: Secondary | ICD-10-CM | POA: Diagnosis not present

## 2020-01-27 NOTE — Progress Notes (Signed)
44 yo P4 here for evaluation of a vaginal skin tag. Patient reports pelvic pressure on occasions and as a result skin tag seems to enlarge. Patient denies any urinary symptoms. She reports a monthly period. She is sexually active without contraception and is considering having another child in the future  Past Medical History:  Diagnosis Date  . H/O pilonidal cyst 2002   . Hypertension   . PID (acute pelvic inflammatory disease) 2006   . Pneumonia   . Vitamin D deficiency 2015    Past Surgical History:  Procedure Laterality Date  . CESAREAN SECTION    . CYST EXCISION Right 2003   hand   Family History  Problem Relation Age of Onset  . Hypertension Mother   . Diabetes Mother   . Asthma Mother   . COPD Mother   . Depression Mother   . Miscarriages / [place] Mother   . Vision loss Mother   . Heart disease Mother   . Parkinson's disease Father   . Diabetes Maternal Aunt   . Diabetes Maternal Uncle   . Diabetes Cousin   . Breast cancer Maternal Grandmother   . Leukemia Sister    Social History   Tobacco Use  . Smoking status: Current Every Day Smoker    Packs/day: 0.50    Years: 20.00    Pack years: 10.00    Types: Cigarettes  . Smokeless tobacco: Never Used  Vaping Use  . Vaping Use: Never used  Substance Use Topics  . Alcohol use: No    Alcohol/week: 0.0 standard drinks  . Drug use: No   ROS See pertinent in HPI. All other systems reviewed and non contributory  Blood pressure 129/83, pulse 88, temperature 98.6 F (37 C), temperature source Oral, height 5\' 6"  (1.676 m), weight 212 lb (96.2 kg), last menstrual period 01/08/2020.;sn  GENERAL: Well-developed, well-nourished female in no acute distress.  ABDOMEN: Soft, nontender, nondistended. No organomegaly. PELVIC: Normal external female genitalia. Vagina is pink and rugated.  Normal discharge. Normal appearing cervix, visible at the introitus. Uterus is normal in size. No adnexal mass or  tenderness. EXTREMITIES: No cyanosis, clubbing, or edema, 2+ distal pulses.  A/P 44 yo with uterine prolapse - Discussed hysterectomy as definitive treatment but patient is contemplating to conceive one more time - Patient referred to urogynecology for evaluation and management - RTC prn

## 2020-01-27 NOTE — Progress Notes (Signed)
RGYN patient presents for problem visit today.   CC: Skin Tag , pt believes skin tag is bigger. No pain.Did not have surgery due to going out of town.  Pt also notes boil or hair bump in vaginal area on the right side pt states it comes and goes onset 1-2 weeks ago. Pt has not recently shaved or waxed.

## 2020-01-28 ENCOUNTER — Encounter: Payer: Self-pay | Admitting: Dietician

## 2020-01-28 ENCOUNTER — Encounter: Payer: Medicaid Other | Attending: Physician Assistant | Admitting: Dietician

## 2020-01-28 ENCOUNTER — Other Ambulatory Visit: Payer: Self-pay

## 2020-01-28 VITALS — Ht 66.0 in | Wt 211.5 lb

## 2020-01-28 DIAGNOSIS — E669 Obesity, unspecified: Secondary | ICD-10-CM | POA: Diagnosis not present

## 2020-01-28 DIAGNOSIS — I10 Essential (primary) hypertension: Secondary | ICD-10-CM | POA: Insufficient documentation

## 2020-01-28 NOTE — Patient Instructions (Addendum)
Get back to drinking 64 oz of water a day.  Try roasting or air frying your vegetables.  Start your day with your eggs!  Build your meals to the proportions of the balanced plate 1/4 Starch 1/4 Protein 1/2 Non-Starchy Vegetables  Try dark chocolate Hummus and fruit!  Work on slowing down and eating mindfully

## 2020-01-28 NOTE — Progress Notes (Signed)
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 States that she drives around between shifts. Pt is looking to find a better way of eating, incorporating more vegetables, weight loss, overall health. Pt considers herself the glue of the family, and leads a busy life. Pt reports wanting to plan a trip to [place] in the summer. Pt reports having a lot of family around for the holidays and is going to be cooking a very large meal. Pt states she was doing well before Thanksgiving, but feels that she has backtracked since then. States she has problems with portion control.  Pt reports her eating habits could improve. Has a 31 year old daughter with picky eating habits. Cooks for herself, daughter, and son usually. Pt states that she is a night eater. Pt wants to cook what she likes, but does not like eating leftovers. Pt likes pasta and rice for leftovers. Pt likes to have eggs for breakfast, but was told to only eat 1 a day by her doctor. Pt was working out 3 times a week, had a Physiological scientist. Stopped recently, but is about to start again this week.  Pt enjoys baking when she is bored or has a lot on her mind to distract herself. Bakes a lot of different pound cakes. Pt doesn't like milk, yogurt, but will eat eat cheese. Pt likes fruit. Pt reports that she stopped drinking soda for a while. Pt states that she  wants to learn how to cook more vegetables. Dislikes: green beans, tomatoes, cooked peppers, squash, cauliflower Likes: Asparagus, cucumubers, brussell sprouts, zucchini, greens    Estimated daily fluid intake: ~100 oz Supplements: Vitamin D Stress / self-care: medium stress, likes to bake to de-stress Current average weekly physical activity: ADLs, going back to the gym soon  24-Hr Dietary Recall First Meal: coffee with cream Snack: none Second Meal: [employer] chicken sandwich, fries, lemonade, 2 cookies Snack: none Third Meal: 6 Pizza rolls, 12 oz Coke  Snack: none Beverages: Lemonade, water, coke,    NUTRITION DIAGNOSIS  NB-1.1 Food and nutrition-related knowledge deficit As related to obesity.  As evidenced by BMI of 34.14, 24 hr recall.   NUTRITION INTERVENTION  Nutrition education (E-1) on the following topics:  Educated patient on the balanced plate eating model. Recommended lunch and dinner be 1/2 non-starchy vegetables, 1/4 starches, and 1/4 protein. Recommended breakfast be a balance of starch and protein with a piece of fruit. Discussed with patient the importance of working towards hitting the proportions of the balanced plate consistently. Counseled patient on ways to begin recognizing each of the food groups from the balanced plate in their own meals, and how close they are to fitting the recommended proportions of the balanced plate. Educated patient on the nutritional value of each food group on the balanced plate model. Counseled patient on beginning to rebuild their trust in themselves to make the right food choices for their health. Educated patient on mindful  eating, including listening to their body's hunger and satiety cues, as well as eating slowly and allowing meals to be more of a sensory experience. Counseled patient on allowing themselves to be present in their emotions when they consider emotional eating. Advised patient to evaluate whether the impulse to eat  is hunger based, or emotionally driven. Educate patient on the relationship between sodium and hypertension, and the importance of seeking reduced sodium food options.   Handouts Provided Include   Hypertension Nutrition Therapy Nutrition Care Manual  Balanced Plate   Learning Style & Readiness for Change Teaching method utilized: Visual & Auditory  Demonstrated degree of understanding via: Teach Back  Barriers to learning/adherence to lifestyle change: none  Goals Established by Pt  Get back to drinking 64 oz of water a day.  Try roasting or air frying your vegetables.  Start your day with your eggs!  Build your meals to the proportions of the balanced plate  1/4 Starch  1/4 Protein  1/2 Non-Starchy Vegetables  Try dark chocolate Hummus and fruit!  Work on slowing down and eating mindfully   MONITORING & EVALUATION Dietary intake, weekly physical activity, and water intake in 1 month.  Next Steps  Patient is to try chocolate hummus, and follow up with dietitian.

## 2020-01-28 NOTE — Progress Notes (Signed)
[facility] New Patient Evaluation and Consultation  Referring Provider: Mora Bellman, MD PCP: [name], NP Date of Service: 01/30/2020  SUBJECTIVE Chief Complaint: New Patient (Initial Visit) and Vaginal Prolapse  History of Present Illness: Emily Saunders is a 44 y.o. Black or African-American female seen in consultation at the request of Dr. Elly Modena for evaluation of prolapse.    Review of records from Dr Elly Modena significant for: Patient complained of bulge/ skin tag and was found to have uterine prolapse. Is potentially interested in future childbearing.   Urinary Symptoms: Does not leak urine.   Day time voids- every few hours.  Nocturia: 0 times per night to void. Voiding dysfunction: she empties her bladder well.  does not use a catheter to empty bladder.  When urinating, she feels she has no difficulties  UTIs: 0 UTI's in the last year.   Denies history of blood in urine and kidney or bladder stones  Pelvic Organ Prolapse Symptoms:                  She Admits to a feeling of a bulge the vaginal area. It has been present for 8 months. Fell down some steps and noticed it after. Has been coming out more recently.  She Admits to seeing a bulge.  This bulge is bothersome.  Bowel Symptom: Bowel movements: 2 time(s) per week Stool consistency: hard or soft  Straining: no.  Splinting: no.  Incomplete evacuation: no.  She Denies accidental bowel leakage / fecal incontinence Bowel regimen: has been taking miralax, and has seen improvement recently.  Last colonoscopy: n/a  Sexual Function Sexually active: yes.  Pain with sex: No  Pelvic Pain Denies pelvic pain  Past Medical History:  Past Medical History:  Diagnosis Date  . H/O pilonidal cyst 2002   . Hypertension   . PID (acute pelvic inflammatory disease) 2006   . Pneumonia   . Vitamin D deficiency 2015      Past Surgical History:   Past Surgical History:  Procedure Laterality  Date  . CESAREAN SECTION    . CYST EXCISION Right 2003   hand     Past OB/GYN History: G6 P4 Vaginal deliveries: 3,  Forceps/ Vacuum deliveries: 0, Cesarean section: 1 Menopausal: No,  Patient's last menstrual period was 01/28/2020 (exact date). Contraception: none. Last pap smear was 08/2018- negative.  Any history of abnormal pap smears: no. Unsure about having more children  Medications: She has a current medication list which includes the following prescription(s): amlodipine, blood pressure kit, hydrochlorothiazide, omeprazole, vitafol ultra, atorvastatin, clomiphene, and phentermine.   Allergies: Patient has No Known Allergies.   Social History:  Social History   Tobacco Use  . Smoking status: Current Every Day Smoker    Packs/day: 0.50    Years: 20.00    Pack years: 10.00    Types: Cigarettes  . Smokeless tobacco: Never Used  Vaping Use  . Vaping Use: Never used  Substance Use Topics  . Alcohol use: No    Alcohol/week: 0.0 standard drinks  . Drug use: No    Relationship status: long-term partner She lives with children.   She is employed- drives school bus. Regular exercise: Yes: walking, cardio, weights   Family History:   Family History  Problem Relation Age of Onset  . Hypertension Mother   . Diabetes Mother   . Asthma Mother   . COPD Mother   . Depression Mother   . Miscarriages / [place] Mother   .  Vision loss Mother   . Heart disease Mother   . Parkinson's disease Father   . Diabetes Maternal Aunt   . Diabetes Maternal Uncle   . Diabetes Cousin   . Breast cancer Maternal Grandmother   . Leukemia Sister      Review of Systems: Review of Systems  Constitutional: Negative for fever, malaise/fatigue and weight loss.  Respiratory: Negative for cough, shortness of breath and wheezing.   Cardiovascular: Negative for chest pain, palpitations and leg swelling.  Gastrointestinal: Negative for abdominal pain and blood in stool.  Genitourinary:  Negative for dysuria.  Musculoskeletal: Negative for myalgias.  Skin: Negative for rash.  Neurological: Positive for headaches. Negative for dizziness.  Endo/Heme/Allergies: Does not bruise/bleed easily.  Psychiatric/Behavioral: Negative for depression. The patient is not nervous/anxious.      OBJECTIVE Physical Exam: Vitals:   01/30/20 0834  BP: 136/86  Pulse: 89  Weight: 210 lb (95.3 kg)  Height: 5' 6"  (1.676 m)    Physical Exam Constitutional:      General: She is not in acute distress. Pulmonary:     Effort: Pulmonary effort is normal.  Abdominal:     General: There is no distension.     Palpations: Abdomen is soft.     Tenderness: There is no abdominal tenderness. There is no rebound.  Musculoskeletal:        General: No swelling. Normal range of motion.  Skin:    General: Skin is warm and dry.     Findings: No rash.  Neurological:     Mental Status: She is alert and oriented to person, place, and time.  Psychiatric:        Mood and Affect: Mood normal.        Behavior: Behavior normal.     GU / Detailed Urogynecologic Evaluation:  Pelvic Exam: Normal external female genitalia; Bartholin's and Skene's glands normal in appearance; urethral meatus normal in appearance, no urethral masses or discharge.   CST: negative  Speculum exam reveals normal vaginal mucosa without atrophy. Cervix normal appearance. Uterus normal single, nontender. Adnexa no mass, fullness, tenderness.  Scant blood noted in vaginal vault.   Pelvic floor strength I/V, puborectalis III/V external anal sphincter III/V  Pelvic floor musculature: Right levator non-tender, Right obturator non-tender, Left levator non-tender, Left obturator non-tender  POP-Q:   POP-Q  -1                                            Aa   -1                                           Ba  3                                              C   4                                            Gh  3  Pb  9                                            tvl   -1                                            Ap  -1                                            Bp  -5.5                                              D     Rectal Exam:  Normal sphincter tone, small distal rectocele, enterocoele not present, no rectal masses  Post-Void Residual (PVR) by Bladder Scan: In order to evaluate bladder emptying, we discussed obtaining a postvoid residual and she agreed to this procedure.  Procedure: The ultrasound unit was placed on the patient's abdomen in the suprapubic region after the patient had voided. A PVR of 9 ml was obtained by bladder scan.  Laboratory Results: POC urine:  +blood  I visualized the urine specimen, noting the specimen to be dark yellow  ASSESSMENT AND PLAN Emily Saunders is a 44 y.o. with:  1. Uterovaginal prolapse, incomplete   2. Prolapse of anterior vaginal wall   3. Prolapse of posterior vaginal wall   4. Frequency of urination    1. Stage II anterior, Stage II posterior, Stage III apical prolapse For treatment of pelvic organ prolapse, we discussed options for management including expectant management, conservative management, and surgical management, such as Kegels, a pessary, pelvic floor physical therapy, and specific surgical procedures. - She is interested in surgery and would like to proceed with TVH, Uterosacral suspension, anterior/ posterior repair and perineorrhaphy. She will need to return for simple CMG to assess for occult incontinence prior to surgery.   -POC urine negative for infection. +1 blood in urine attributable to vaginal bleeding.   Return for CMG test.    Jaquita Folds, MD   Medical Decision Making:  - Reviewed/ ordered a clinical laboratory test - Reviewed/ ordered medicine test - Review and summation of prior records

## 2020-01-29 ENCOUNTER — Encounter (HOSPITAL_BASED_OUTPATIENT_CLINIC_OR_DEPARTMENT_OTHER): Payer: Self-pay | Admitting: Otolaryngology

## 2020-01-29 ENCOUNTER — Other Ambulatory Visit: Payer: Self-pay

## 2020-01-30 ENCOUNTER — Ambulatory Visit (INDEPENDENT_AMBULATORY_CARE_PROVIDER_SITE_OTHER): Payer: Medicaid Other | Admitting: Obstetrics and Gynecology

## 2020-01-30 ENCOUNTER — Encounter: Payer: Self-pay | Admitting: Obstetrics and Gynecology

## 2020-01-30 VITALS — BP 136/86 | HR 89 | Ht 66.0 in | Wt 210.0 lb

## 2020-01-30 DIAGNOSIS — N8110 Cystocele, unspecified: Secondary | ICD-10-CM | POA: Diagnosis not present

## 2020-01-30 DIAGNOSIS — R350 Frequency of micturition: Secondary | ICD-10-CM | POA: Diagnosis not present

## 2020-01-30 DIAGNOSIS — N816 Rectocele: Secondary | ICD-10-CM | POA: Diagnosis not present

## 2020-01-30 DIAGNOSIS — N812 Incomplete uterovaginal prolapse: Secondary | ICD-10-CM | POA: Diagnosis not present

## 2020-01-30 LAB — POCT URINALYSIS DIPSTICK
Appearance: NORMAL
Bilirubin, UA: NEGATIVE
Glucose, UA: NEGATIVE
Ketones, UA: NEGATIVE
Leukocytes, UA: NEGATIVE
Nitrite, UA: NEGATIVE
Protein, UA: NEGATIVE
Spec Grav, UA: 1.025 (ref 1.010–1.025)
Urobilinogen, UA: 0.2 E.U./dL
pH, UA: 5 (ref 5.0–8.0)

## 2020-01-30 NOTE — Patient Instructions (Signed)
You have a stage 3 (out of 4) prolapse.  We discussed the fact that it is not life threatening but there are several treatment options. For treatment of pelvic organ prolapse, we discussed options for management including expectant management, conservative management, and surgical management, such as Kegels, a pessary, pelvic floor physical therapy, and specific surgical procedures.      

## 2020-01-31 NOTE — H&P (Signed)
HPI:   Chief Complaint  Patient presents with   Return Patient  Check Ears/Sinus Issues   Emily Saunders is a 44 y.o. female who presents as an established patient for sinonasal concerns. For the past six months she is experiencing daily (left sided) drainage that she describes from the nose into the throat. Oftentimes foul tasting and white in color. Seems more noticeable when yawning. She has seen her dentist and an Transport planner. Dental imaging questioned a problem with the left maxillary sinus. She has dentures on the top and missing teeth on the bottom. She reports occasional nasal congestion. Is not using anything topical for the nose.   Denies fever, purulent rhinorrhea, facial pressure/pain, ear pain/pressure, hearing loss, tinnitus, dizziness, sore throat, dysphagia, odynophagia, globus sensation, excessive mucous feeling in the throat, throat clearing, cough or hoarseness. No facial numbness or weakness.  No history of recurrent sinusitis. No history of sinonasal surgery. No recent sinus CT scan.  No PMH of stroke or myocardial infarction.  Current smoker.   PMH/Meds/All/SocHx/FamHx/ROS:   Past Medical History:  Diagnosis Date   Hypertension   Past Surgical History:  Procedure Laterality Date   CESAREAN SECTION   GANGLION CYST EXCISION  right wrist   No family history of bleeding disorders, wound healing problems or difficulty with anesthesia.   Social History   Socioeconomic History   Marital status: Unknown  Spouse name: Not on file   Number of children: Not on file   Years of education: Not on file   Highest education level: Not on file  Occupational History   Not on file  Tobacco Use   Smoking status: Current Every Day Smoker   Smokeless tobacco: Never Used  Vaping Use   Vaping Use: Never used  Substance and Sexual Activity   Alcohol use: Not Currently   Drug use: Not on file   Sexual activity: Not on file  Other Topics Concern   Not on  file  Social History Narrative   Not on file   Social Determinants of Health   Financial Resource Strain:   Difficulty of Paying Living Expenses: Not on file  Food Insecurity:   Worried About Running Out of Food in the Last Year: Not on file   Ran Out of Food in the Last Year: Not on file  Transportation Needs:   Lack of Transportation (Medical): Not on file   Lack of Transportation (Non-Medical): Not on file  Physical Activity:   Days of Exercise per Week: Not on file   Minutes of Exercise per Session: Not on file  Stress:   Feeling of Stress : Not on file  Social Connections:   Frequency of Communication with Friends and Family: Not on file   Frequency of Social Gatherings with Friends and Family: Not on file   Attends Religious Services: Not on file   Active Member of Clubs or Organizations: Not on file   Attends Banker Meetings: Not on file   Marital Status: Not on file   Current Outpatient Medications:   amLODIPine (NORVASC) 10 MG tablet, Take 10 mg by mouth., Disp: , Rfl:   clomiPHENE (CLOMID) 50 mg tablet, Take 1 mg by mouth., Disp: , Rfl:   hydroCHLOROthiazide (HYDRODIURIL) 25 MG tablet, Take 25 mg by mouth., Disp: , Rfl:   phentermine (ADIPEX-P) 37.5 MG capsule, Take 37.5 mg by mouth., Disp: , Rfl:   CHANTIX STARTING MONTH BOX 0.5 mg (11)- 1 mg (42) tablet, , Disp: , Rfl:  cyclobenzaprine (FLEXERIL) 10 MG tablet, TK 1 T PO HS, Disp: , Rfl: 1  meloxicam (MOBIC) 7.5 MG tablet, TK 1 T PO D, Disp: , Rfl: 1  PNV 67-iron ps-folate no.1-dha (VITAFOL ULTRA) 29 mg iron- 1 mg-200 mg Cap, Take 1 capsule by mouth., Disp: , Rfl:   A complete ROS was performed with pertinent positives/negatives noted in the HPI. The remainder of the ROS are negative.   Physical Exam:   BP 122/79   Pulse 78   Temp 97.1 F (36.2 C)   Ht 1.689 m (5' 6.5")   Wt 95.3 kg (210 lb)   BMI 33.39 kg/m   General Awake, at baseline alertness during examination.  Eyes  No scleral icterus or conjunctival hemorrhage. Globe position appears normal. EOMI.  Right Ear EAC patent, TM intact w/o inflammation. Middle ear well aerated.  Left Ear EAC patent, TM intact w/o inflammation. Middle ear well aerated.  Nose Slight leftward septal deviation. Dried mucous at the right nasal vestibule. Patent, no polyps or masses seen on anterior rhinoscopy.  Oral cavity No mucosal lesions or tumors seen. Tongue midline.  Oropharynx Symmetric tonsils.  Larynx (indirect mirror exam) Base of tongue normal and symmetric; epiglottis normal. Could not visualize vocal cords due to strong gag reflex.  Neck No abnormal cervical lymphadenopathy. No thyromegaly. No thyroid masses palpated.  Cardio-vascular No cyanosis.  Pulmonary No audible stridor. Breathing easily with no labor.  Neuro Symmetric facial movement.  Psychiatry Appropriate affect and mood for clinic visit.   Independent Review of Additional Tests or Records:  Medical records.   Procedures:   Maxillofacial CT   Impression & Plans:  [name] is a 44 y.o. female with concerns of chronic post-nasal drip and periodic nasal obstruction (left sided). 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 Imaging and report reviewed with Dr. Constance Holster. She will follow up with Dr. Constance Holster to discuss results and options going forward.

## 2020-02-06 ENCOUNTER — Other Ambulatory Visit (HOSPITAL_COMMUNITY)
Admission: RE | Admit: 2020-02-06 | Discharge: 2020-02-06 | Disposition: A | Discharge status: Home / Self Care | Payer: Medicaid Other | Source: Ambulatory Visit | Attending: Otolaryngology | Admitting: Otolaryngology

## 2020-02-06 ENCOUNTER — Encounter (HOSPITAL_BASED_OUTPATIENT_CLINIC_OR_DEPARTMENT_OTHER)
Admission: RE | Admit: 2020-02-06 | Discharge: 2020-02-06 | Disposition: A | Discharge status: Home / Self Care | Payer: Medicaid Other | Source: Ambulatory Visit | Attending: Otolaryngology | Admitting: Otolaryngology

## 2020-02-06 DIAGNOSIS — Z01812 Encounter for preprocedural laboratory examination: Secondary | ICD-10-CM | POA: Diagnosis not present

## 2020-02-06 DIAGNOSIS — Z791 Long term (current) use of non-steroidal anti-inflammatories (NSAID): Secondary | ICD-10-CM | POA: Diagnosis not present

## 2020-02-06 DIAGNOSIS — Z20822 Contact with and (suspected) exposure to covid-19: Secondary | ICD-10-CM | POA: Diagnosis not present

## 2020-02-06 DIAGNOSIS — J3489 Other specified disorders of nose and nasal sinuses: Secondary | ICD-10-CM | POA: Diagnosis not present

## 2020-02-06 DIAGNOSIS — I10 Essential (primary) hypertension: Secondary | ICD-10-CM | POA: Diagnosis not present

## 2020-02-06 DIAGNOSIS — Z79899 Other long term (current) drug therapy: Secondary | ICD-10-CM | POA: Diagnosis not present

## 2020-02-06 DIAGNOSIS — F17200 Nicotine dependence, unspecified, uncomplicated: Secondary | ICD-10-CM | POA: Diagnosis not present

## 2020-02-06 LAB — BASIC METABOLIC PANEL
Anion gap: 10 (ref 5–15)
BUN: 7 mg/dL (ref 6–20)
CO2: 27 mmol/L (ref 22–32)
Calcium: 9.3 mg/dL (ref 8.9–10.3)
Chloride: 101 mmol/L (ref 98–111)
Creatinine, Ser: 0.83 mg/dL (ref 0.44–1.00)
GFR, Estimated: >60 mL/min (ref >60)
Glucose, Bld: 87 mg/dL (ref 70–99)
Potassium: 4.3 mmol/L (ref 3.5–5.1)
Sodium: 138 mmol/L (ref 135–145)

## 2020-02-06 LAB — SARS CORONAVIRUS 2 (TAT 6-24 HRS): SARS Coronavirus 2: NEGATIVE

## 2020-02-06 NOTE — H&P (Signed)
HPI:   Chief Complaint  Patient presents with  . Return Patient  Check Ears/Sinus Issues   Cathryne Herbst is a 44 y.o. female who presents as an established patient for sinonasal concerns. For the past six months she is experiencing daily (left sided) drainage that she describes from the nose into the throat. Oftentimes foul tasting and white in color. Seems more noticeable when yawning. She has seen her dentist and an Chief Financial Officer. Dental imaging questioned a problem with the left maxillary sinus. She has dentures on the top and missing teeth on the bottom. She reports occasional nasal congestion. Is not using anything topical for the nose.   Denies fever, purulent rhinorrhea, facial pressure/pain, ear pain/pressure, hearing loss, tinnitus, dizziness, sore throat, dysphagia, odynophagia, globus sensation, excessive mucous feeling in the throat, throat clearing, cough or hoarseness. No facial numbness or weakness.  No history of recurrent sinusitis. No history of sinonasal surgery. No recent sinus CT scan.  No PMH of stroke or myocardial infarction.  Current smoker.   PMH/Meds/All/SocHx/FamHx/ROS:   Past Medical History:  Diagnosis Date  . Hypertension   Past Surgical History:  Procedure Laterality Date  . CESAREAN SECTION  . GANGLION CYST EXCISION  right wrist   No family history of bleeding disorders, wound healing problems or difficulty with anesthesia.   Social History   Socioeconomic History  . Marital status: Unknown  Spouse name: Not on file  . Number of children: Not on file  . Years of education: Not on file  . Highest education level: Not on file  Occupational History  . Not on file  Tobacco Use  . Smoking status: Current Every Day Smoker  . Smokeless tobacco: Never Used  Vaping Use  . Vaping Use: Never used  Substance and Sexual Activity  . Alcohol use: Not Currently  . Drug use: Not on file  . Sexual activity: Not on file  Other Topics Concern  . Not on  file  Social History Narrative  . Not on file   Social Determinants of Health   Financial Resource Strain:  . Difficulty of Paying Living Expenses: Not on file  Food Insecurity:  . Worried About Charity fundraiser in the Last Year: Not on file  . Ran Out of Food in the Last Year: Not on file  Transportation Needs:  . Lack of Transportation (Medical): Not on file  . Lack of Transportation (Non-Medical): Not on file  Physical Activity:  . Days of Exercise per Week: Not on file  . Minutes of Exercise per Session: Not on file  Stress:  . Feeling of Stress : Not on file  Social Connections:  . Frequency of Communication with Friends and Family: Not on file  . Frequency of Social Gatherings with Friends and Family: Not on file  . Attends Religious Services: Not on file  . Active Member of Clubs or Organizations: Not on file  . Attends Archivist Meetings: Not on file  . Marital Status: Not on file   Current Outpatient Medications:  . amLODIPine (NORVASC) 10 MG tablet, Take 10 mg by mouth., Disp: , Rfl:  . clomiPHENE (CLOMID) 50 mg tablet, Take 1 mg by mouth., Disp: , Rfl:  . hydroCHLOROthiazide (HYDRODIURIL) 25 MG tablet, Take 25 mg by mouth., Disp: , Rfl:  . phentermine (ADIPEX-P) 37.5 MG capsule, Take 37.5 mg by mouth., Disp: , Rfl:  . CHANTIX STARTING MONTH BOX 0.5 mg (11)- 1 mg (42) tablet, , Disp: , Rfl:  .  cyclobenzaprine (FLEXERIL) 10 MG tablet, TK 1 T PO HS, Disp: , Rfl: 1 . meloxicam (MOBIC) 7.5 MG tablet, TK 1 T PO D, Disp: , Rfl: 1 . PNV 67-iron ps-folate no.1-dha (VITAFOL ULTRA) 29 mg iron- 1 mg-200 mg Cap, Take 1 capsule by mouth., Disp: , Rfl:   A complete ROS was performed with pertinent positives/negatives noted in the HPI. The remainder of the ROS are negative.   Physical Exam:   BP 122/79  Pulse 78  Temp 97.1 F (36.2 C)  Ht 1.689 m (5' 6.5")  Wt 95.3 kg (210 lb)  BMI 33.39 kg/m   General Awake, at baseline alertness during examination.  Eyes  No scleral icterus or conjunctival hemorrhage. Globe position appears normal. EOMI.  Right Ear EAC patent, TM intact w/o inflammation. Middle ear well aerated.  Left Ear EAC patent, TM intact w/o inflammation. Middle ear well aerated.  Nose Slight leftward septal deviation. Dried mucous at the right nasal vestibule. Patent, no polyps or masses seen on anterior rhinoscopy.  Oral cavity No mucosal lesions or tumors seen. Tongue midline.  Oropharynx Symmetric tonsils.  Larynx (indirect mirror exam) Base of tongue normal and symmetric; epiglottis normal. Could not visualize vocal cords due to strong gag reflex.  Neck No abnormal cervical lymphadenopathy. No thyromegaly. No thyroid masses palpated.  Cardio-vascular No cyanosis.  Pulmonary No audible stridor. Breathing easily with no labor.  Neuro Symmetric facial movement.  Psychiatry Appropriate affect and mood for clinic visit.   Independent Review of Additional Tests or Records:  Medical records.   Procedures:   Maxillofacial CT   Impression & Plans:  Lubna Stegeman is a 44 y.o. female with concerns of chronic post-nasal drip and periodic nasal obstruction (left sided). 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 Imaging and report reviewed with Dr. Pollyann Kennedy. She will follow up with Dr. Pollyann Kennedy to discuss results and options going forward.   Patient agrees with the plan.

## 2020-02-10 ENCOUNTER — Ambulatory Visit (HOSPITAL_BASED_OUTPATIENT_CLINIC_OR_DEPARTMENT_OTHER): Payer: Medicaid Other | Admitting: Anesthesiology

## 2020-02-10 ENCOUNTER — Encounter (HOSPITAL_BASED_OUTPATIENT_CLINIC_OR_DEPARTMENT_OTHER)
Admission: RE | Disposition: A | Discharge status: Home / Self Care | Payer: Self-pay | Source: Home / Self Care | Attending: Otolaryngology

## 2020-02-10 ENCOUNTER — Ambulatory Visit (HOSPITAL_BASED_OUTPATIENT_CLINIC_OR_DEPARTMENT_OTHER)
Admission: RE | Admit: 2020-02-10 | Discharge: 2020-02-10 | Disposition: A | Discharge status: Home / Self Care | Payer: Medicaid Other | Attending: Otolaryngology | Admitting: Otolaryngology

## 2020-02-10 ENCOUNTER — Encounter (HOSPITAL_BASED_OUTPATIENT_CLINIC_OR_DEPARTMENT_OTHER): Payer: Self-pay | Admitting: Otolaryngology

## 2020-02-10 DIAGNOSIS — J3489 Other specified disorders of nose and nasal sinuses: Secondary | ICD-10-CM | POA: Insufficient documentation

## 2020-02-10 DIAGNOSIS — I10 Essential (primary) hypertension: Secondary | ICD-10-CM | POA: Insufficient documentation

## 2020-02-10 DIAGNOSIS — J189 Pneumonia, unspecified organism: Secondary | ICD-10-CM | POA: Diagnosis not present

## 2020-02-10 DIAGNOSIS — E559 Vitamin D deficiency, unspecified: Secondary | ICD-10-CM | POA: Diagnosis not present

## 2020-02-10 DIAGNOSIS — Z79899 Other long term (current) drug therapy: Secondary | ICD-10-CM | POA: Diagnosis not present

## 2020-02-10 DIAGNOSIS — F17200 Nicotine dependence, unspecified, uncomplicated: Secondary | ICD-10-CM | POA: Diagnosis not present

## 2020-02-10 DIAGNOSIS — Z791 Long term (current) use of non-steroidal anti-inflammatories (NSAID): Secondary | ICD-10-CM | POA: Insufficient documentation

## 2020-02-10 HISTORY — PX: NASAL SINUS SURGERY: SHX719

## 2020-02-10 LAB — POCT PREGNANCY, URINE: Preg Test, Ur: NEGATIVE

## 2020-02-10 SURGERY — SINUS SURGERY, ENDOSCOPIC
Anesthesia: General | Laterality: Bilateral

## 2020-02-10 MED ORDER — LACTATED RINGERS IV SOLN: INTRAVENOUS | Status: DC

## 2020-02-10 MED ORDER — FENTANYL CITRATE (PF) 100 MCG/2ML IJ SOLN
INTRAMUSCULAR | Status: DC | PRN
  Administered 2020-02-10 (×3): 50 ug via INTRAVENOUS

## 2020-02-10 MED ORDER — MIDAZOLAM HCL 5 MG/5ML IJ SOLN
INTRAMUSCULAR | Status: DC | PRN
  Administered 2020-02-10: 2 mg via INTRAVENOUS

## 2020-02-10 MED ORDER — LIDOCAINE-EPINEPHRINE 1 %-1:100000 IJ SOLN
INTRAMUSCULAR | Status: DC | PRN
  Administered 2020-02-10: 2.5 mL

## 2020-02-10 MED ORDER — LIDOCAINE HCL (CARDIAC) PF 100 MG/5ML IV SOSY
PREFILLED_SYRINGE | INTRAVENOUS | Status: DC | PRN
  Administered 2020-02-10: 40 mg via INTRAVENOUS

## 2020-02-10 MED ORDER — ROCURONIUM BROMIDE 10 MG/ML (PF) SYRINGE
PREFILLED_SYRINGE | INTRAVENOUS | Status: AC
  Filled 2020-02-10: qty 10

## 2020-02-10 MED ORDER — DIPHENHYDRAMINE HCL 25 MG PO CAPS
25.0000 mg | ORAL_CAPSULE | Freq: Once | ORAL | Status: AC
  Administered 2020-02-10: 25 mg via ORAL

## 2020-02-10 MED ORDER — LIDOCAINE-EPINEPHRINE 1 %-1:100000 IJ SOLN
INTRAMUSCULAR | Status: AC
  Filled 2020-02-10: qty 1

## 2020-02-10 MED ORDER — DEXAMETHASONE SODIUM PHOSPHATE 4 MG/ML IJ SOLN
INTRAMUSCULAR | Status: DC | PRN
  Administered 2020-02-10: 10 mg via INTRAVENOUS

## 2020-02-10 MED ORDER — OXYMETAZOLINE HCL 0.05 % NA SOLN
2.0000 | NASAL | Status: DC
  Administered 2020-02-10: 2 via NASAL

## 2020-02-10 MED ORDER — OXYCODONE HCL 5 MG PO TABS
ORAL_TABLET | ORAL | Status: AC
  Filled 2020-02-10: qty 1

## 2020-02-10 MED ORDER — FENTANYL CITRATE (PF) 100 MCG/2ML IJ SOLN
25.0000 ug | INTRAMUSCULAR | Status: DC | PRN
  Administered 2020-02-10 (×3): 50 ug via INTRAVENOUS

## 2020-02-10 MED ORDER — SUGAMMADEX SODIUM 200 MG/2ML IV SOLN
INTRAVENOUS | Status: DC | PRN
  Administered 2020-02-10: 200 mg via INTRAVENOUS

## 2020-02-10 MED ORDER — ONDANSETRON HCL 4 MG/2ML IJ SOLN
INTRAMUSCULAR | Status: DC | PRN
  Administered 2020-02-10: 4 mg via INTRAVENOUS

## 2020-02-10 MED ORDER — OXYMETAZOLINE HCL 0.05 % NA SOLN
NASAL | Status: AC
  Filled 2020-02-10: qty 30

## 2020-02-10 MED ORDER — LIDOCAINE 2% (20 MG/ML) 5 ML SYRINGE
INTRAMUSCULAR | Status: AC
  Filled 2020-02-10: qty 5

## 2020-02-10 MED ORDER — FENTANYL CITRATE (PF) 100 MCG/2ML IJ SOLN
INTRAMUSCULAR | Status: AC
  Filled 2020-02-10: qty 2

## 2020-02-10 MED ORDER — BACITRACIN ZINC 500 UNIT/GM EX OINT
TOPICAL_OINTMENT | CUTANEOUS | Status: AC
  Filled 2020-02-10: qty 28.35

## 2020-02-10 MED ORDER — OXYCODONE HCL 5 MG PO TABS
5.0000 mg | ORAL_TABLET | Freq: Once | ORAL | Status: AC
  Administered 2020-02-10: 5 mg via ORAL

## 2020-02-10 MED ORDER — DEXAMETHASONE SODIUM PHOSPHATE 10 MG/ML IJ SOLN
INTRAMUSCULAR | Status: AC
  Filled 2020-02-10: qty 1

## 2020-02-10 MED ORDER — HYDROCODONE-ACETAMINOPHEN 7.5-325 MG PO TABS: 1.0000 | ORAL_TABLET | Freq: Four times a day (QID) | ORAL | 0 refills | Status: DC | PRN

## 2020-02-10 MED ORDER — PROPOFOL 10 MG/ML IV BOLUS
INTRAVENOUS | Status: AC
  Filled 2020-02-10: qty 20

## 2020-02-10 MED ORDER — MIDAZOLAM HCL 2 MG/2ML IJ SOLN
INTRAMUSCULAR | Status: AC
  Filled 2020-02-10: qty 2

## 2020-02-10 MED ORDER — ONDANSETRON HCL 4 MG/2ML IJ SOLN
INTRAMUSCULAR | Status: AC
  Filled 2020-02-10: qty 2

## 2020-02-10 MED ORDER — PROMETHAZINE HCL 25 MG RE SUPP: 25.0000 mg | Freq: Four times a day (QID) | RECTAL | 1 refills | Status: DC | PRN

## 2020-02-10 MED ORDER — PROPOFOL 10 MG/ML IV BOLUS
INTRAVENOUS | Status: DC | PRN
  Administered 2020-02-10: 140 mg via INTRAVENOUS

## 2020-02-10 MED ORDER — OXYMETAZOLINE HCL 0.05 % NA SOLN
NASAL | Status: DC | PRN
  Administered 2020-02-10: 1 via TOPICAL

## 2020-02-10 MED ORDER — DIPHENHYDRAMINE HCL 25 MG PO CAPS
ORAL_CAPSULE | ORAL | Status: AC
  Filled 2020-02-10: qty 1

## 2020-02-10 SURGICAL SUPPLY — 47 items
ATTRACTOMAT 16X20 MAGNETIC DRP (DRAPES) IMPLANT
BLADE RAD40 ROTATE 4M 4 5PK (BLADE) IMPLANT
BLADE RAD60 ROTATE M4 4 5PK (BLADE) IMPLANT
BLADE TRICUT ROTATE M4 4 5PK (BLADE) IMPLANT
BUR HS RAD FRONTAL 3 (BURR) IMPLANT
CANISTER SUC SOCK COL 7IN (MISCELLANEOUS) ×2 IMPLANT
CANISTER SUCT 1200ML W/VALVE (MISCELLANEOUS) ×4 IMPLANT
CLEANER CAUTERY TIP 5X5 PAD (MISCELLANEOUS) IMPLANT
CORD BIPOLAR FORCEPS 12FT (ELECTRODE) IMPLANT
COVER WAND RF STERILE (DRAPES) IMPLANT
DECANTER SPIKE VIAL GLASS SM (MISCELLANEOUS) IMPLANT
DRESSING NASAL KENNEDY 3.5X.9 (MISCELLANEOUS) IMPLANT
DRSG CURAD 3X16 NADH (PACKING) IMPLANT
DRSG NASAL KENNEDY 3.5X.9 (MISCELLANEOUS)
DRSG NASAL KENNEDY LMNT 8CM (GAUZE/BANDAGES/DRESSINGS) IMPLANT
DRSG NASOPORE 8CM (GAUZE/BANDAGES/DRESSINGS) ×2 IMPLANT
DRSG TELFA 3X8 NADH (GAUZE/BANDAGES/DRESSINGS) IMPLANT
ELECT COATED BLADE 2.86 ST (ELECTRODE) ×2 IMPLANT
FORCEPS BIPOLAR SPETZLER 8 1.0 (NEUROSURGERY SUPPLIES) IMPLANT
GAUZE 4X4 16PLY RFD (DISPOSABLE) IMPLANT
GAUZE VASELINE FOILPK 1/2 X 72 (GAUZE/BANDAGES/DRESSINGS) IMPLANT
GLOVE ECLIPSE 7.5 STRL STRAW (GLOVE) ×2 IMPLANT
GOWN STRL REUS W/ TWL LRG LVL3 (GOWN DISPOSABLE) ×2 IMPLANT
GOWN STRL REUS W/TWL LRG LVL3 (GOWN DISPOSABLE) ×4
HEMOSTAT SURGICEL .5X2 ABSORB (HEMOSTASIS) IMPLANT
IV NS 500ML (IV SOLUTION)
IV NS 500ML BAXH (IV SOLUTION) IMPLANT
NDL PRECISIONGLIDE 27X1.5 (NEEDLE) ×1 IMPLANT
NDL SPNL 25GX3.5 QUINCKE BL (NEEDLE) IMPLANT
NEEDLE PRECISIONGLIDE 27X1.5 (NEEDLE) ×2 IMPLANT
NEEDLE SPNL 25GX3.5 QUINCKE BL (NEEDLE) IMPLANT
NS IRRIG 1000ML POUR BTL (IV SOLUTION) ×2 IMPLANT
PACK BASIN DAY SURGERY FS (CUSTOM PROCEDURE TRAY) ×2 IMPLANT
PACK ENT DAY SURGERY (CUSTOM PROCEDURE TRAY) ×2 IMPLANT
PAD CLEANER CAUTERY TIP 5X5 (MISCELLANEOUS)
PAD DRESSING TELFA 3X8 NADH (GAUZE/BANDAGES/DRESSINGS) IMPLANT
PATTIES SURGICAL .5 X3 (DISPOSABLE) ×2 IMPLANT
PENCIL FOOT CONTROL (ELECTRODE) ×2 IMPLANT
SLEEVE SCD COMPRESS KNEE MED (MISCELLANEOUS) ×2 IMPLANT
SOLUTION BUTLER CLEAR DIP (MISCELLANEOUS) ×2 IMPLANT
SPONGE GAUZE 2X2 8PLY STRL LF (GAUZE/BANDAGES/DRESSINGS) ×2 IMPLANT
SPONGE SURGIFOAM ABS GEL 12-7 (HEMOSTASIS) IMPLANT
SUT CHROMIC 3 0 PS 2 (SUTURE) IMPLANT
SUT CHROMIC 4 0 PS 2 18 (SUTURE) IMPLANT
TOWEL GREEN STERILE FF (TOWEL DISPOSABLE) ×2 IMPLANT
TUBE CONNECTING 20X1/4 (TUBING) IMPLANT
YANKAUER SUCT BULB TIP NO VENT (SUCTIONS) ×2 IMPLANT

## 2020-02-10 NOTE — Anesthesia Preprocedure Evaluation (Signed)
Anesthesia Evaluation  Patient identified by MRN, date of birth, ID band Patient awake    Reviewed: Allergy & Precautions, NPO status , Patient's Chart, lab work & pertinent test results  Airway Mallampati: II  TM Distance: >3 FB     Dental   Pulmonary pneumonia, Current Smoker and Patient abstained from smoking.,    breath sounds clear to auscultation       Cardiovascular hypertension,  Rhythm:Regular Rate:Normal     Neuro/Psych    GI/Hepatic negative GI ROS, Neg liver ROS,   Endo/Other  negative endocrine ROS  Renal/GU negative Renal ROS     Musculoskeletal   Abdominal   Peds  Hematology  (+) anemia ,   Anesthesia Other Findings   Reproductive/Obstetrics                             Anesthesia Physical Anesthesia Plan  ASA: II  Anesthesia Plan: General   Post-op Pain Management:    Induction: Intravenous  PONV Risk Score and Plan:   Airway Management Planned: Oral ETT  Additional Equipment:   Intra-op Plan:   Post-operative Plan: Extubation in OR  Informed Consent: I have reviewed the patients History and Physical, chart, labs and discussed the procedure including the risks, benefits and alternatives for the proposed anesthesia with the patient or authorized representative who has indicated his/her understanding and acceptance.     Dental advisory given  Plan Discussed with: CRNA, Anesthesiologist and Surgeon  Anesthesia Plan Comments:         Anesthesia Quick Evaluation

## 2020-02-10 NOTE — Discharge Instructions (Signed)
Use nasal saline spray every 30-60 minutes while awake.  This can be purchased over-the-counter.  Avoid any heavy lifting straining or bending over.  Avoid blowing your nose for 1 week.  Post Anesthesia Home Care Instructions  Activity: Get plenty of rest for the remainder of the day. A responsible individual must stay with you for 24 hours following the procedure.  For the next 24 hours, DO NOT: -Drive a car -Advertising copywriter -Drink alcoholic beverages -Take any medication unless instructed by your physician -Make any legal decisions or sign important papers.  Meals: Start with liquid foods such as gelatin or soup. Progress to regular foods as tolerated. Avoid greasy, spicy, heavy foods. If nausea and/or vomiting occur, drink only clear liquids until the nausea and/or vomiting subsides. Call your physician if vomiting continues.  Special Instructions/Symptoms: Your throat may feel dry or sore from the anesthesia or the breathing tube placed in your throat during surgery. If this causes discomfort, gargle with warm salt water. The discomfort should disappear within 24 hours.  If you had a scopolamine patch placed behind your ear for the management of post- operative nausea and/or vomiting:  1. The medication in the patch is effective for 72 hours, after which it should be removed.  Wrap patch in a tissue and discard in the trash. Wash hands thoroughly with soap and water. 2. You may remove the patch earlier than 72 hours if you experience unpleasant side effects which may include dry mouth, dizziness or visual disturbances. 3. Avoid touching the patch. Wash your hands with soap and water after contact with the patch.

## 2020-02-10 NOTE — Op Note (Signed)
OPERATIVE REPORT  DATE OF SURGERY: 02/10/2020  PATIENT:  Emily Saunders,  45 y.o. female  PRE-OPERATIVE DIAGNOSIS:  Maxillary sinus mass  POST-OPERATIVE DIAGNOSIS:  Maxillary sinus mass  PROCEDURE:  Procedure(s): ENDOSCOPIC SINUS SURGERY Maxillary Anstrostomy with Tissue Removal  SURGEON:  Susy Frizzle, MD  ASSISTANTS: None  ANESTHESIA:   General   EBL: 30 ml  DRAINS: None  LOCAL MEDICATIONS USED: 1% Xylocaine with epinephrine  SPECIMEN: Left maxillary sinus mass  COUNTS:  Correct  PROCEDURE DETAILS: The patient was taken to the operating room and placed on the operating table in the supine position. Following induction of general endotracheal anesthesia, the face was draped in standard fashion for sinus surgery.  Oxymetazoline spray was used preoperatively in the nasal cavities.  The 0 degree endoscope was used to inspect the left nasal cavity and the middle turbinate was infiltrated with local anesthetic in the superior and posterior attachments and along the lateral nasal wall.  A sickle knife was used to remove the uncinate process exposing the infundibulum.  The anterior fontanelle was opened with a curved suction using 30 degree endoscope.  The large cervical mass was identified immediately.  It was covered in bone.  The bone was entered into with a suction and purulent material was suctioned out.  The entire bony outline was removed using suction and angled forceps.  Thickened polypoid mucosa was also resected.  Antrostomy was widely opened using the backbiting forceps anteriorly and Tru-Cut forceps posteriorly.  Remaining sinuses were left unmolested.  Specimen was sent for pathologic evaluation.  Nasal cavity and pharynx were suctioned of blood and secretions.  Afrin pledgets were placed until extubation and then removed.  Patient was awakened extubated and transferred to recovery in stable condition.    PATIENT DISPOSITION:  To PACU, stable

## 2020-02-10 NOTE — Transfer of Care (Signed)
Immediate Anesthesia Transfer of Care Note  Patient: Emily Saunders  Procedure(s) Performed: ENDOSCOPIC SINUS SURGERY Maxillary Anstrostomy with Tissue Removal (Bilateral )  Patient Location: PACU  Anesthesia Type:General  Level of Consciousness: sedated  Airway & Oxygen Therapy: Patient Spontanous Breathing and Patient connected to face mask oxygen  Post-op Assessment: Report given to RN and Post -op Vital signs reviewed and stable  Post vital signs: Reviewed and stable  Last Vitals:  Vitals Value Taken Time  BP 108/67 02/10/20 1031  Temp    Pulse 81 02/10/20 1035  Resp 27 02/10/20 1035  SpO2 97 % 02/10/20 1035  Vitals shown include unvalidated device data.  Last Pain:  Vitals:   02/10/20 0850  TempSrc: Oral  PainSc: 0-No pain         Complications: No complications documented.

## 2020-02-10 NOTE — Interval H&P Note (Signed)
History and Physical Interval Note:  02/10/2020 9:20 AM  Emily Saunders  has presented today for surgery, with the diagnosis of Maxillary sinus mass.  The various methods of treatment have been discussed with the patient and family. After consideration of risks, benefits and other options for treatment, the patient has consented to  Procedure(s): ENDOSCOPIC SINUS SURGERY Maxillary Anstrostomy with Tissue Removal (Bilateral) Possible Calwell Luc Proceedure (Bilateral) as a surgical intervention.  The patient's history has been reviewed, patient examined, no change in status, stable for surgery.  I have reviewed the patient's chart and labs.  Questions were answered to the patient's satisfaction.     [name]

## 2020-02-10 NOTE — Anesthesia Postprocedure Evaluation (Signed)
Anesthesia Post Note  Patient: Emily Saunders  Procedure(s) Performed: ENDOSCOPIC SINUS SURGERY Maxillary Anstrostomy with Tissue Removal (Bilateral )     Patient location during evaluation: PACU Anesthesia Type: General Level of consciousness: awake Pain management: pain level controlled Vital Signs Assessment: post-procedure vital signs reviewed and stable Respiratory status: spontaneous breathing Cardiovascular status: stable Postop Assessment: no apparent nausea or vomiting Anesthetic complications: no   No complications documented.  Last Vitals:  Vitals:   02/10/20 1145 02/10/20 1249  BP: 98/77 105/69  Pulse: 83 81  Resp: 18 16  Temp:  (!) 36.3 C  SpO2: 99% 98%    Last Pain:  Vitals:   02/10/20 1249  TempSrc: Oral  PainSc: 3                  Ariba Lehnen

## 2020-02-10 NOTE — Anesthesia Procedure Notes (Signed)
Procedure Name: Intubation Date/Time: 02/10/2020 9:42 AM Performed by: Maryella Shivers, CRNA Pre-anesthesia Checklist: Patient identified, Emergency Drugs available, Suction available and Patient being monitored Patient Re-evaluated:Patient Re-evaluated prior to induction Oxygen Delivery Method: Circle system utilized Preoxygenation: Pre-oxygenation with 100% oxygen Induction Type: IV induction Ventilation: Mask ventilation without difficulty Laryngoscope Size: Mac and 4 Grade View: Grade I Tube type: Oral Rae Tube size: 7.0 mm Number of attempts: 1 Airway Equipment and Method: Oral airway Placement Confirmation: ETT inserted through vocal cords under direct vision,  positive ETCO2 and breath sounds checked- equal and bilateral Secured at: 20 cm Tube secured with: Tape Dental Injury: Teeth and Oropharynx as per pre-operative assessment

## 2020-02-10 NOTE — Progress Notes (Signed)
[facility] Return Visit  SUBJECTIVE  History of Present Illness: Emily Saunders is a 45 y.o. female seen in follow-up for simple CMG test and discussion of surgery.   She does not have any leakage of urine currently. She would like to proceed with surgery for prolapse repair.   Past Medical History: Patient  has a past medical history of H/O pilonidal cyst (2002 ), Hypertension, PID (acute pelvic inflammatory disease) (2006 ), Pneumonia, and Vitamin D deficiency (2015 ).   Past Surgical History: She  has a past surgical history that includes Cesarean section; Cyst excision (Right, 2003); and Nasal sinus surgery (Bilateral, 02/10/2020).   Medications: She has a current medication list which includes the following prescription(s): amlodipine, atorvastatin, blood pressure kit, clomiphene, hydrochlorothiazide, hydrocodone-acetaminophen, omeprazole, phentermine, vitafol ultra, and promethazine.   Allergies: Patient has No Known Allergies.   Social History: Patient  reports that she has been smoking cigarettes. She has a 10.00 pack-year smoking history. She has never used smokeless tobacco. She reports that she does not drink alcohol and does not use drugs.      OBJECTIVE     Physical Exam: Vitals:   02/14/20 0925  BP: 125/86  Pulse: 75  Weight: 217 lb (98.4 kg)  Height: 5' 6"  (1.676 m)   Gen: No apparent distress, A&O x 3.  Detailed Urogynecologic Evaluation:   Normal external female genitalia; Bartholin's and Skene's glands normal in appearance; urethral meatus normal in appearance, no urethral masses or discharge.   Prior exam 01/30/20 showed:  Speculum exam reveals normal vaginal mucosa without atrophy. Cervix normal appearance. Uterus normal single, nontender. Adnexa no mass, fullness, tenderness.  Scant blood noted in vaginal vault.  POP-Q:   POP-Q  -1                                            Aa   -1                                           Ba   3                                              C   4                                            Gh  3                                            Pb  9                                            tvl   -1  Ap  -1                                            Bp  -5.5                                              D      Verbal consent was obtained to perform simple CMG procedure:   Prolapse was reduced using 2 large cotton swabs. Urethra was prepped with betadine and a 64F catheter was placed and bladder was drained completely. The bladder was then backfilled with sterile water by gravity.  First sensation: 93m First Desire: 949mStrong Desire: 150 Capacity: 31621mough stress test was negative. Valsalva stress test was negative.  She was was allowed to void on her own in the Uroflow.   UROFLOW: Revealed a Qmax of 20.2mL80mc.  She voided 316.6.  It was a normal pattern.   Interpretation: CMG showed increased sensation and normal cystometric capacity. Findings negative for stress incontinence, negative for detrusor overactivity.   Uroflow showed normal curve.    ASSESSMENT AND PLAN    Ms. DearTranga 44 y69. with:  1. Uterovaginal prolapse, incomplete   2. Prolapse of anterior vaginal wall   3. Prolapse of posterior vaginal wall   4. Urinary urgency     - She did not demonstrate SUI today on exam. Discussed that although this test is predictive of occult SUI, it is not 100% and there is still a chance she will leak after surgery.   Plan for surgery: Exam under anesthesia, Total vaginal hysterectomy, bilateral salpingectomy, Uterosacral suspension, anterior/ posterior repair and perineorrhaphy   - We reviewed the patient's specific anatomic and functional findings, with the assistance of diagrams, and together finalized the above procedure. The planned surgical procedures were discussed along with the surgical risks  outlined below, which were also provided on a detailed handout. Additional treatment options including expectant management, conservative management, medical management were discussed where appropriate.  We reviewed the benefits and risks of each treatment option.   General Surgical Risks: For all procedures, there are risks of bleeding, infection, damage to surrounding organs including but not limited to bowel, bladder, blood vessels, ureters and nerves, and need for further surgery if an injury were to occur. These risks are all low with minimally invasive surgery.   There are risks of numbness and weakness at any body site or buttock/rectal pain.  It is possible that baseline pain can be worsened by surgery, either with or without mesh. If surgery is vaginal, there is also a low risk of possible conversion to laparoscopy or open abdominal incision where indicated. Very rare risks include blood transfusion, blood clot, heart attack, pneumonia, or death.   There is also a risk of short-term postoperative urinary retention with need to use a catheter. About half of patients need to go home from surgery with a catheter, which is then later removed in the office. The risk of long-term need for a catheter is very low. There is also a risk of worsening of overactive bladder.    Prolapse (with or without mesh): Risk factors for surgical failure  include things that put pressure on your pelvis and the surgical repair, including obesity, chronic  cough, and heavy lifting or straining (including lifting children or adults, straining on the toilet, or lifting heavy objects such as furniture or anything weighing >25 lbs. Risks of recurrence is 20-30% with vaginal native tissue repair and a less than 10% with sacrocolpopexy with mesh.     - For preop Visit:  She is required to have a visit within 30 days of her surgery.   - Medical clearance: not required  - Anticoagulant use: No - Medicaid Hysterectomy form:  Yes- will fill out at pre-op visits - Accepts blood transfusion: Yes - Expected length of stay: observation- has a 45yo at home and prefers to stay overnight one night. Discussed we will have to reevaluate covid restrictions at the time we schedule the surgery.   Request sent for surgery scheduling. She would like to schedule surgery for the beginning of May.   Jaquita Folds, MD   Time spent: I spent 30 minutes dedicated to the care of this patient on the date of this encounter to include pre-visit review of records, face-to-face time with the patient discussing surgical plan and post visit documentation. Additonal time was spent performing procedures.

## 2020-02-11 ENCOUNTER — Encounter (HOSPITAL_BASED_OUTPATIENT_CLINIC_OR_DEPARTMENT_OTHER): Payer: Self-pay | Admitting: Otolaryngology

## 2020-02-11 NOTE — Addendum Note (Signed)
Addendum  created 02/11/20 4540 by Merrill Deanda, Jewel Baize, CRNA   Charge Capture section accepted

## 2020-02-12 LAB — SURGICAL PATHOLOGY

## 2020-02-13 ENCOUNTER — Ambulatory Visit: Payer: Medicaid Other | Admitting: Neurology

## 2020-02-13 ENCOUNTER — Encounter: Payer: Self-pay | Admitting: Neurology

## 2020-02-13 VITALS — BP 104/63 | HR 68 | Ht 66.0 in | Wt 217.0 lb

## 2020-02-13 DIAGNOSIS — G4489 Other headache syndrome: Secondary | ICD-10-CM

## 2020-02-13 NOTE — Progress Notes (Signed)
[facility]    Provider:  Dr Jaynee Eagles Requesting Provider: Julian Hy, [place]* Primary Care Provider:  [name], NP  CC:  Headaches  HPI:  Emily Saunders is a 45 y.o. female here as requested by Julian Hy, PA* for headaches. PMHx hypertension, anemia, poor sleep pattern, unintended weight loss, current smoker, obesity.  I reviewed notes from [facility] who requested patient be seen; there is paucity of information in these notes on patient's headaches.  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   She had the surgery on Monday to remove the sinus mass and she feels much improved. She fell down the steps in February and had whiplash. She was having headache sharp pains on the left side from the maxillary area to the eyebrow. It continued she didn't think it was the whiplash. Sharp, shooting, brief.  They were severe, she would have them constantly multiple times a day, even touching the face would trigger, nothing made it better. She also had drainage and ended up going to the ENT and they found the mass and she had it removed. Since the mass was removed she has not had any of the same headaches. She is feeling better.No other focal neurologic deficits, associated symptoms, inciting events or modifiable factors.  Reviewed notes, labs and imaging from outside physicians, which showed: see above  BVMP 02/06/20 BMP normal  Review of Systems: Patient complains of symptoms per HPI as well as the following symptoms: sinus mass. Pertinent negatives and positives per  HPI. All others negative.   Social History   Socioeconomic History  . Marital status: Single    Spouse name: Not on file  . Number of children: Not on file  . Years of education: Not on file  . Highest education level: Not on file  Occupational History  . Not on file  Tobacco Use  . Smoking status: Current Every Day Smoker    Packs/day: 0.50    Years: 20.00    Pack years: 10.00    Types: Cigarettes  . Smokeless tobacco: Never Used  Vaping Use  . Vaping Use: Never used  Substance and Sexual Activity  . Alcohol use: No    Alcohol/week: 0.0 standard drinks  . Drug use: No  . Sexual activity: Yes    Partners: Male    Birth control/protection: None  Other Topics Concern  . Not on file  Social History Narrative   Works at [employer].   Up at 6 AM.    Gets 4 hs of sleep off and on.       Daughter and son live with her   Caffeine: maybe 1 cup/day   Right handed   Social Determinants of Health   Financial Resource Strain: Not on file  Food Insecurity: Not on file  Transportation Needs: Not on file  Physical Activity: Not on file  Stress: Not on file  Social Connections: Not on file  Intimate Partner Violence: Not on file    Family History  Problem Relation Age of Onset  . Hypertension Mother   . Diabetes Mother   .  Asthma Mother   . COPD Mother   . Depression Mother   . Miscarriages / [place] Mother   . Vision loss Mother   . Heart disease Mother   . Parkinson's disease Father   . Diabetes Maternal Aunt   . Diabetes Maternal Uncle   . Diabetes Cousin   . Breast cancer Maternal Grandmother   . Leukemia Sister     Past Medical History:  Diagnosis Date  . H/O pilonidal cyst 2002   . Hypertension   . PID (acute pelvic inflammatory disease) 2006   . Pneumonia   . Vitamin D deficiency 2015     Patient Active Problem List   Diagnosis Date Noted  . Pain in left knee 09/11/2019  . HTN (hypertension) 02/27/2015  . Unintended weight loss  10/27/2014  . Allergic rhinitis 06/23/2014  . Elevated BP 06/23/2014  . Vitamin D insufficiency 03/03/2014  . Pilonidal cyst 03/03/2014  . Broken teeth 03/03/2014  . Poor sleep pattern 03/03/2014  . Obesity (BMI 30-39.9) 03/03/2014  . Current smoker 03/03/2014  . Anemia 05/27/2013    Past Surgical History:  Procedure Laterality Date  . CESAREAN SECTION    . CYST EXCISION Right 2003   hand  . NASAL SINUS SURGERY Bilateral 02/10/2020   Procedure: ENDOSCOPIC SINUS SURGERY Maxillary Anstrostomy with Tissue Removal;  Surgeon: Izora Gala, MD;  Location: [place];  Service: ENT;  Laterality: Bilateral;    Current Outpatient Medications  Medication Sig Dispense Refill  . amLODipine (NORVASC) 10 MG tablet Take 1 tablet (10 mg total) by mouth daily. 30 tablet 11  . Blood Pressure Monitoring (BLOOD PRESSURE KIT) DEVI 1 mL by Does not apply route 3 (three) times daily. 1 Bag 0  . hydrochlorothiazide (HYDRODIURIL) 25 MG tablet Take 1 tablet (25 mg total) by mouth daily. 30 tablet 11  . HYDROcodone-acetaminophen (NORCO) 7.5-325 MG tablet Take 1 tablet by mouth every 6 (six) hours as needed for moderate pain. 20 tablet 0  . omeprazole (PRILOSEC) 40 MG capsule Take 40 mg by mouth daily.    . phentermine 37.5 MG capsule Take 1 capsule (37.5 mg total) by mouth every morning. 30 capsule 2  . Prenat-Fe Poly-Methfol-FA-DHA (VITAFOL ULTRA) 29-0.6-0.4-200 MG CAPS Take 1 capsule by mouth daily before breakfast. 30 capsule 11  . promethazine (PHENERGAN) 25 MG suppository Place 1 suppository (25 mg total) rectally every 6 (six) hours as needed for nausea or vomiting. 12 suppository 1  . atorvastatin (LIPITOR) 20 MG tablet Take 1 tablet (20 mg total) by mouth daily. 90 tablet 3  . clomiPHENE (CLOMID) 50 MG tablet One tablet po daily starting days 5 - 9 of cycle. 5 tablet 1   No current facility-administered medications for this visit.    Allergies as of 02/13/2020  . (No Known Allergies)     Vitals: BP 104/63 (BP Location: Right Arm, Patient Position: Sitting)   Pulse 68   Ht 5' 6"  (1.676 m)   Wt 217 lb (98.4 kg)   LMP 01/28/2020 (Exact Date)   BMI 35.02 kg/m  Last Weight:  Wt Readings from Last 1 Encounters:  02/14/20 217 lb (98.4 kg)   Last Height:   Ht Readings from Last 1 Encounters:  02/14/20 5' 6"  (1.676 m)     Physical exam: Exam: Gen: NAD, conversant, well nourised, obese, well groomed                     CV: RRR, no MRG. No  Carotid Bruits. No peripheral edema, warm, nontender Eyes: Conjunctivae clear without exudates or hemorrhage  Neuro: Detailed Neurologic Exam  Speech:    Speech is normal; fluent and spontaneous with normal comprehension.  Cognition:    The patient is oriented to person, place, and time;     recent and remote memory intact;     language fluent;     normal attention, concentration,     fund of knowledge Cranial Nerves:    The pupils are equal, round, and reactive to light. The fundi are flat. Visual fields are full to finger confrontation. Extraocular movements are intact. Trigeminal sensation is intact and the muscles of mastication are normal. The face is symmetric. The palate elevates in the midline. Hearing intact. Voice is normal. Shoulder shrug is normal. The tongue has normal motion without fasciculations.   Coordination:    Normal finger to nose  Gait:    Normal native gait  Motor Observation:    No asymmetry, no atrophy, and no involuntary movements noted. Tone:    Normal muscle tone.    Posture:    Posture is normal. normal erect    Strength:    Strength is V/V in the upper and lower limbs.      Sensation: intact to LT     Reflex Exam:  DTR's:    Deep tendon reflexes in the upper and lower extremities are normal bilaterally.   Toes:    The toes are downgoing bilaterally.   Clonus:    Clonus is absent.    Assessment/Plan:  45 y.o. female here as requested by Julian Hy, PA* for  headaches. PMHx hypertension, anemia, poor sleep pattern, unintended weight loss, current smoker, obesity.  I reviewed notes from [facility] who requested patient be seen; there is paucity of information in these notes on patient's headaches and would appreciate patient be evaluated more closely prior to specialist referral from [facility].    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 Follow clinically.    No orders of the defined types were placed in this encounter.   Cc: Julian Hy, PA*,  [name], NP  Sarina Ill, MD  [facility] [address] [place], [place] [address]  Phone [phone] Fax [phone]

## 2020-02-14 ENCOUNTER — Encounter: Payer: Self-pay | Admitting: Obstetrics and Gynecology

## 2020-02-14 ENCOUNTER — Ambulatory Visit (INDEPENDENT_AMBULATORY_CARE_PROVIDER_SITE_OTHER): Payer: Medicaid Other | Admitting: Obstetrics and Gynecology

## 2020-02-14 ENCOUNTER — Other Ambulatory Visit: Payer: Self-pay

## 2020-02-14 VITALS — BP 125/86 | HR 75 | Ht 66.0 in | Wt 217.0 lb

## 2020-02-14 DIAGNOSIS — N816 Rectocele: Secondary | ICD-10-CM | POA: Diagnosis not present

## 2020-02-14 DIAGNOSIS — N812 Incomplete uterovaginal prolapse: Secondary | ICD-10-CM | POA: Diagnosis not present

## 2020-02-14 DIAGNOSIS — N8110 Cystocele, unspecified: Secondary | ICD-10-CM | POA: Diagnosis not present

## 2020-02-14 DIAGNOSIS — R3915 Urgency of urination: Secondary | ICD-10-CM | POA: Diagnosis not present

## 2020-02-14 DIAGNOSIS — H524 Presbyopia: Secondary | ICD-10-CM | POA: Diagnosis not present

## 2020-02-14 NOTE — Patient Instructions (Signed)
Surgery plan: Total vaginal hysterectomy, bilateral salpingectomy, uterosacral ligament suspension, anterior and posterior repair with perineorrhaphy and cystoscopy

## 2020-02-16 ENCOUNTER — Encounter: Payer: Self-pay | Admitting: Neurology

## 2020-02-26 ENCOUNTER — Ambulatory Visit: Payer: Medicaid Other | Admitting: Dietician

## 2020-02-28 DIAGNOSIS — E781 Pure hyperglyceridemia: Secondary | ICD-10-CM | POA: Diagnosis not present

## 2020-02-28 DIAGNOSIS — R7303 Prediabetes: Secondary | ICD-10-CM | POA: Diagnosis not present

## 2020-02-28 DIAGNOSIS — F17210 Nicotine dependence, cigarettes, uncomplicated: Secondary | ICD-10-CM | POA: Diagnosis not present

## 2020-02-28 DIAGNOSIS — R635 Abnormal weight gain: Secondary | ICD-10-CM | POA: Diagnosis not present

## 2020-02-28 DIAGNOSIS — Z6834 Body mass index (BMI) 34.0-34.9, adult: Secondary | ICD-10-CM | POA: Diagnosis not present

## 2020-02-28 DIAGNOSIS — N926 Irregular menstruation, unspecified: Secondary | ICD-10-CM | POA: Diagnosis not present

## 2020-03-03 DIAGNOSIS — K581 Irritable bowel syndrome with constipation: Secondary | ICD-10-CM | POA: Diagnosis not present

## 2020-03-03 DIAGNOSIS — K219 Gastro-esophageal reflux disease without esophagitis: Secondary | ICD-10-CM | POA: Diagnosis not present

## 2020-03-10 ENCOUNTER — Ambulatory Visit: Payer: Medicaid Other | Admitting: Dietician

## 2020-03-17 ENCOUNTER — Encounter: Payer: Medicaid Other | Attending: Physician Assistant | Admitting: Dietician

## 2020-03-17 DIAGNOSIS — I10 Essential (primary) hypertension: Secondary | ICD-10-CM | POA: Insufficient documentation

## 2020-03-17 DIAGNOSIS — E669 Obesity, unspecified: Secondary | ICD-10-CM | POA: Insufficient documentation

## 2020-04-11 DIAGNOSIS — R058 Other specified cough: Secondary | ICD-10-CM | POA: Diagnosis not present

## 2020-04-11 DIAGNOSIS — R635 Abnormal weight gain: Secondary | ICD-10-CM | POA: Diagnosis not present

## 2020-04-14 DIAGNOSIS — R1319 Other dysphagia: Secondary | ICD-10-CM | POA: Diagnosis not present

## 2020-04-18 DIAGNOSIS — J302 Other seasonal allergic rhinitis: Secondary | ICD-10-CM | POA: Diagnosis not present

## 2020-04-18 DIAGNOSIS — D849 Immunodeficiency, unspecified: Secondary | ICD-10-CM | POA: Diagnosis not present

## 2020-04-18 DIAGNOSIS — H1045 Other chronic allergic conjunctivitis: Secondary | ICD-10-CM | POA: Diagnosis not present

## 2020-04-18 DIAGNOSIS — R1310 Dysphagia, unspecified: Secondary | ICD-10-CM | POA: Diagnosis not present

## 2020-04-18 DIAGNOSIS — H6121 Impacted cerumen, right ear: Secondary | ICD-10-CM | POA: Diagnosis not present

## 2020-04-18 DIAGNOSIS — J3089 Other allergic rhinitis: Secondary | ICD-10-CM | POA: Diagnosis not present

## 2020-04-18 DIAGNOSIS — H6123 Impacted cerumen, bilateral: Secondary | ICD-10-CM | POA: Diagnosis not present

## 2020-04-24 DIAGNOSIS — R1319 Other dysphagia: Secondary | ICD-10-CM | POA: Diagnosis not present

## 2020-04-24 DIAGNOSIS — Z1211 Encounter for screening for malignant neoplasm of colon: Secondary | ICD-10-CM | POA: Diagnosis not present

## 2020-04-24 DIAGNOSIS — Z01818 Encounter for other preprocedural examination: Secondary | ICD-10-CM | POA: Diagnosis not present

## 2020-04-28 DIAGNOSIS — J3089 Other allergic rhinitis: Secondary | ICD-10-CM | POA: Diagnosis not present

## 2020-04-28 DIAGNOSIS — D849 Immunodeficiency, unspecified: Secondary | ICD-10-CM | POA: Diagnosis not present

## 2020-04-28 DIAGNOSIS — H1045 Other chronic allergic conjunctivitis: Secondary | ICD-10-CM | POA: Diagnosis not present

## 2020-04-28 DIAGNOSIS — J302 Other seasonal allergic rhinitis: Secondary | ICD-10-CM | POA: Diagnosis not present

## 2020-04-30 DIAGNOSIS — Z6834 Body mass index (BMI) 34.0-34.9, adult: Secondary | ICD-10-CM | POA: Diagnosis not present

## 2020-04-30 DIAGNOSIS — R7303 Prediabetes: Secondary | ICD-10-CM | POA: Diagnosis not present

## 2020-04-30 DIAGNOSIS — F17210 Nicotine dependence, cigarettes, uncomplicated: Secondary | ICD-10-CM | POA: Diagnosis not present

## 2020-04-30 DIAGNOSIS — N926 Irregular menstruation, unspecified: Secondary | ICD-10-CM | POA: Diagnosis not present

## 2020-04-30 DIAGNOSIS — K2970 Gastritis, unspecified, without bleeding: Secondary | ICD-10-CM | POA: Diagnosis not present

## 2020-04-30 DIAGNOSIS — A048 Other specified bacterial intestinal infections: Secondary | ICD-10-CM | POA: Diagnosis not present

## 2020-04-30 DIAGNOSIS — E781 Pure hyperglyceridemia: Secondary | ICD-10-CM | POA: Diagnosis not present

## 2020-04-30 DIAGNOSIS — R635 Abnormal weight gain: Secondary | ICD-10-CM | POA: Diagnosis not present

## 2020-04-30 DIAGNOSIS — K3189 Other diseases of stomach and duodenum: Secondary | ICD-10-CM | POA: Diagnosis not present

## 2020-05-01 ENCOUNTER — Ambulatory Visit (INDEPENDENT_AMBULATORY_CARE_PROVIDER_SITE_OTHER): Payer: Medicaid Other | Admitting: Primary Care

## 2020-05-01 DIAGNOSIS — K200 Eosinophilic esophagitis: Secondary | ICD-10-CM | POA: Diagnosis not present

## 2020-05-11 NOTE — Progress Notes (Signed)
[facility] Pre-Operative visit  Subjective Chief Complaint: Emily Saunders presents for a preoperative encounter.   History of Present Illness: Emily Saunders is a 45 y.o. female who presents for preoperative visit.  She is scheduled to undergo Exam under anesthesia, Total vaginal hysterectomy, bilateral salpingectomy, Uterosacral suspension, anterior/ posterior repair and perineorrhaphy and cystoscopy on 06/08/20.  Her symptoms include vaginal bulge, and she was was found to have Stage II anterior, Stage II posterior, Stage III apical prolapse. She has had no interval changes to her symptoms since last visit.  Marland Kitchen  simple CMG procedure:  Interpretation: CMG showed increased sensation and normal cystometric capacity. Findings negative for stress incontinence, negative for detrusor overactivity.   Uroflow showed normal curve.   Past Medical History:  Diagnosis Date  . H/O pilonidal cyst 2002   . Hypertension   . PID (acute pelvic inflammatory disease) 2006   . Pneumonia   . Vitamin D deficiency 2015      Past Surgical History:  Procedure Laterality Date  . CESAREAN SECTION    . CYST EXCISION Right 2003   hand  . NASAL SINUS SURGERY Bilateral 02/10/2020   Procedure: ENDOSCOPIC SINUS SURGERY Maxillary Anstrostomy with Tissue Removal;  Surgeon: Izora Gala, MD;  Location: [place];  Service: ENT;  Laterality: Bilateral;    has No Known Allergies.   Family History  Problem Relation Age of Onset  . Hypertension Mother   . Diabetes Mother   . Asthma Mother   . COPD Mother   . Depression Mother   . Miscarriages / [place] Mother   . Vision loss Mother   . Heart disease Mother   . Parkinson's disease Father   . Diabetes Maternal Aunt   . Diabetes Maternal Uncle   . Diabetes Cousin   . Breast cancer Maternal Grandmother   . Leukemia Sister     Social History   Tobacco Use  . Smoking status: Current Every Day Smoker    Packs/day: 0.50     Years: 20.00    Pack years: 10.00    Types: Cigarettes  . Smokeless tobacco: Never Used  Vaping Use  . Vaping Use: Never used  Substance Use Topics  . Alcohol use: No    Alcohol/week: 0.0 standard drinks  . Drug use: No     Review of Systems was negative for a full 10 system review except as noted in the History of Present Illness.   Current Outpatient Medications:  .  amLODipine (NORVASC) 10 MG tablet, Take 1 tablet (10 mg total) by mouth daily., Disp: 30 tablet, Rfl: 11 .  atorvastatin (LIPITOR) 20 MG tablet, Take 1 tablet (20 mg total) by mouth daily., Disp: 90 tablet, Rfl: 3 .  Blood Pressure Monitoring (BLOOD PRESSURE KIT) DEVI, 1 mL by Does not apply route 3 (three) times daily., Disp: 1 Bag, Rfl: 0 .  clomiPHENE (CLOMID) 50 MG tablet, One tablet po daily starting days 5 - 9 of cycle., Disp: 5 tablet, Rfl: 1 .  hydrochlorothiazide (HYDRODIURIL) 25 MG tablet, Take 1 tablet (25 mg total) by mouth daily., Disp: 30 tablet, Rfl: 11 .  HYDROcodone-acetaminophen (NORCO) 7.5-325 MG tablet, Take 1 tablet by mouth every 6 (six) hours as needed for moderate pain., Disp: 20 tablet, Rfl: 0 .  omeprazole (PRILOSEC) 40 MG capsule, Take 40 mg by mouth daily., Disp: , Rfl:  .  phentermine 37.5 MG capsule, Take 1 capsule (37.5 mg total) by mouth every morning., Disp: 30  capsule, Rfl: 2 .  Prenat-Fe Poly-Methfol-FA-DHA (VITAFOL ULTRA) 29-0.6-0.4-200 MG CAPS, Take 1 capsule by mouth daily before breakfast., Disp: 30 capsule, Rfl: 11 .  promethazine (PHENERGAN) 25 MG suppository, Place 1 suppository (25 mg total) rectally every 6 (six) hours as needed for nausea or vomiting., Disp: 12 suppository, Rfl: 1   Objective There were no vitals filed for this visit. Gen: No distress, AAOx3  Previous Pelvic Exam showed: POP-Q (01/30/20):   POP-Q  -1 Aa  -1 Ba  3 C    4 Gh  3 Pb  9 tvl   -1 Ap  -1 Bp  -5.5 D       Assessment/ Plan  Assessment: The patient is a 45 y.o. year old scheduled to undergo Exam under anesthesia, Total vaginal hysterectomy, bilateral salpingectomy, Uterosacral suspension, anterior/ posterior repair and perineorrhaphy . Verbal consent was obtained for these procedures.  Will plan for her to stay overnight since she has a young child and will not be able to care for them right after surgery.   Plan: General Surgical Consent: The patient has previously been counseled on alternative treatments, and the decision by the patient and provider was to proceed with the procedure listed above.  For all procedures, there are risks of bleeding, infection, damage to surrounding organs including but not limited to bowel, bladder, blood vessels, ureters and nerves, and need for further surgery if an injury were to occur. These risks are all low with minimally invasive surgery.   There are risks of numbness and weakness at any body site or buttock/rectal pain.  It is possible that baseline pain can be worsened by surgery, either with or without mesh. If surgery is vaginal, there is also a low risk of possible conversion to laparoscopy or open abdominal incision where indicated. Very rare risks include blood transfusion, blood clot, heart attack, pneumonia, or death.   There is also a risk of short-term postoperative urinary retention with need to use a catheter. About half of patients need to go home from surgery with a catheter, which is then later removed in the office. The risk of long-term need for a catheter is very low. There is also a risk of worsening of overactive  bladder.     Prolapse (with or without mesh): Risk factors for surgical failure  include things that put pressure on your pelvis and the surgical repair, including obesity, chronic cough, and heavy lifting or straining (including lifting children or adults, straining on the toilet, or lifting heavy objects such as furniture or anything weighing >25 lbs. Risks of recurrence is 20-30% with vaginal native tissue repair and a less than 10% with sacrocolpopexy with mesh.    We discussed consent for blood products. Risks for blood transfusion include allergic reactions, other reactions that can affect different body organs and managed accordingly, transmission of infectious diseases such as HIV or Hepatitis. However, the blood is screened. Patient consents for blood products.  Pre-operative instructions:  She was instructed to not take Aspirin/NSAIDs x 7days prior to surgery. Antibiotic prophylaxis was ordered as indicated.  Cathter use: Patient will go home with foley if needed after post-operative voiding trial.  Post-operative instructions:  She was provided with specific post-operative instructions, including precautions and signs/symptoms for which we would recommend contacting us, in addition to daytime and after-hours contact phone numbers. This was provided on a handout.   Post-operative medications: Prescriptions for motrin, tylenol, miralax, and oxycodone will be sent to her pharmacy. Discussed using ibuprofen  and tylenol on a schedule to limit use of narcotics.   Laboratory testing:  We will check labs: CBC, Type and screen and day of surgery UPT  We also discussed that Covid testing will take place prior to her surgery and will get cancelled if she tests positive.    Preoperative clearance:  She does not require surgical clearance.    Post-operative follow-up:  A post-operative appointment will be made for 6 weeks from the date of surgery. If she needs a post-operative nurse visit for a  voiding trial, that will be set up after she leaves the hospital.    Patient will call the clinic or use MyChart should anything change or any new issues arise.   Jaquita Folds, MD

## 2020-05-12 ENCOUNTER — Other Ambulatory Visit: Payer: Self-pay

## 2020-05-12 ENCOUNTER — Encounter: Payer: Self-pay | Admitting: Obstetrics and Gynecology

## 2020-05-12 ENCOUNTER — Ambulatory Visit (INDEPENDENT_AMBULATORY_CARE_PROVIDER_SITE_OTHER): Payer: Medicaid Other | Admitting: Obstetrics and Gynecology

## 2020-05-12 VITALS — BP 117/78 | HR 82 | Ht 66.0 in | Wt 219.5 lb

## 2020-05-12 DIAGNOSIS — N812 Incomplete uterovaginal prolapse: Secondary | ICD-10-CM

## 2020-05-12 DIAGNOSIS — N8110 Cystocele, unspecified: Secondary | ICD-10-CM

## 2020-05-12 DIAGNOSIS — N816 Rectocele: Secondary | ICD-10-CM

## 2020-05-12 NOTE — H&P (Signed)
[facility] Pre-Operative H&P  Subjective Chief Complaint: Emily Saunders presents for a preoperative encounter.   History of Present Illness: Emily Saunders is a 45 y.o. female who presents for preoperative visit.  She is scheduled to undergo Exam under anesthesia, Total vaginal hysterectomy, bilateral salpingectomy, Uterosacral suspension, anterior/ posterior repair and perineorrhaphy and cystoscopy on 06/08/20.  Her symptoms include vaginal bulge, and she was was found to have Stage II anterior, Stage II posterior, Stage III apical prolapse. She has had no interval changes to her symptoms since last visit.  Marland Kitchen  simple CMG procedure:  Interpretation: CMG showed increased sensation and normal cystometric capacity. Findings negative for stress incontinence, negative for detrusor overactivity.   Uroflow showed normal curve.   Past Medical History:  Diagnosis Date  . H/O pilonidal cyst 2002   . Hypertension   . PID (acute pelvic inflammatory disease) 2006   . Pneumonia   . Vitamin D deficiency 2015      Past Surgical History:  Procedure Laterality Date  . CESAREAN SECTION    . CYST EXCISION Right 2003   hand  . NASAL SINUS SURGERY Bilateral 02/10/2020   Procedure: ENDOSCOPIC SINUS SURGERY Maxillary Anstrostomy with Tissue Removal;  Surgeon: Izora Gala, MD;  Location: [facility];  Service: ENT;  Laterality: Bilateral;    has No Known Allergies.   Family History  Problem Relation Age of Onset  . Hypertension Mother   . Diabetes Mother   . Asthma Mother   . COPD Mother   . Depression Mother   . Miscarriages / [place] Mother   . Vision loss Mother   . Heart disease Mother   . Parkinson's disease Father   . Diabetes Maternal Aunt   . Diabetes Maternal Uncle   . Diabetes Cousin   . Breast cancer Maternal Grandmother   . Leukemia Sister     Social History   Tobacco Use  . Smoking status: Current Every Day Smoker    Packs/day: 0.50     Years: 20.00    Pack years: 10.00    Types: Cigarettes  . Smokeless tobacco: Never Used  Vaping Use  . Vaping Use: Never used  Substance Use Topics  . Alcohol use: No    Alcohol/week: 0.0 standard drinks  . Drug use: No     Review of Systems was negative for a full 10 system review except as noted in the History of Present Illness.  No current facility-administered medications for this encounter.  Current Outpatient Medications:  .  amLODipine (NORVASC) 10 MG tablet, Take 1 tablet (10 mg total) by mouth daily., Disp: 30 tablet, Rfl: 11 .  Blood Pressure Monitoring (BLOOD PRESSURE KIT) DEVI, 1 mL by Does not apply route 3 (three) times daily., Disp: 1 Bag, Rfl: 0 .  cetirizine (ZYRTEC) 10 MG tablet, Take 10 mg by mouth daily., Disp: , Rfl:  .  hydrochlorothiazide (HYDRODIURIL) 25 MG tablet, Take 1 tablet (25 mg total) by mouth daily., Disp: 30 tablet, Rfl: 11 .  montelukast (SINGULAIR) 10 MG tablet, Take 10 mg by mouth at bedtime., Disp: , Rfl:  .  omeprazole (PRILOSEC) 40 MG capsule, Take 40 mg by mouth daily., Disp: , Rfl:  .  OZEMPIC, 0.25 OR 0.5 MG/DOSE, 2 MG/1.5ML SOPN, SMARTSIG:0.25 Milligram(s) SUB-Q Once a Week, Disp: , Rfl:  .  Vitamin D, Ergocalciferol, (DRISDOL) 1.25 MG (50000 UNIT) CAPS capsule, Take 1 capsule by mouth once a week., Disp: , Rfl:  Objective There were no vitals filed for this visit. Gen: No distress, AAOx3  Previous Pelvic Exam showed: POP-Q (01/30/20):   POP-Q  -1 Aa  -1 Ba  3 C   4 Gh  3 Pb  9 tvl   -1 Ap  -1 Bp  -5.5 D        Assessment/ Plan  Assessment: The patient is a 45 y.o. year old with Stage III Pelvic organ prolapse  Plan: Exam under anesthesia, Total vaginal hysterectomy, bilateral salpingectomy, Uterosacral suspension, anterior/ posterior repair and perineorrhaphy and cystoscopy .  Jaquita Folds, MD

## 2020-05-26 DIAGNOSIS — R0989 Other specified symptoms and signs involving the circulatory and respiratory systems: Secondary | ICD-10-CM | POA: Diagnosis not present

## 2020-06-02 ENCOUNTER — Encounter (HOSPITAL_BASED_OUTPATIENT_CLINIC_OR_DEPARTMENT_OTHER): Payer: Self-pay | Admitting: Obstetrics and Gynecology

## 2020-06-02 ENCOUNTER — Other Ambulatory Visit: Payer: Self-pay

## 2020-06-02 DIAGNOSIS — Z79899 Other long term (current) drug therapy: Secondary | ICD-10-CM | POA: Diagnosis not present

## 2020-06-02 DIAGNOSIS — N812 Incomplete uterovaginal prolapse: Secondary | ICD-10-CM | POA: Diagnosis present

## 2020-06-02 DIAGNOSIS — F17210 Nicotine dependence, cigarettes, uncomplicated: Secondary | ICD-10-CM | POA: Diagnosis not present

## 2020-06-02 NOTE — Progress Notes (Signed)
YOU ARE SCHEDULED FOR A COVID TEST 06-05-2020 at 245 pm.  THIS TEST MUST BE DONE BEFORE SURGERY. GO TO  [facility]. [place], , IT IS APPROXIMATELY 2 MINUTES PAST [employer] ON THE RIGHT AND REMAIN IN YOUR CAR, THIS IS A DRIVE UP TEST. ONCE YOUR COVID TEST IS DONE PLEASE FOLLOW ALL THE QUARANTINE  INSTRUCTIONS GIVEN IN YOUR HANDOUT.      Your procedure is scheduled on 06-08-2020  Report to [facility].   Call this number if you have problems the morning of surgery  [phone].   OUR ADDRESS IS [address].  WE ARE LOCATED IN [facility].  PLEASE BRING YOUR INSURANCE CARD AND PHOTO ID DAY OF SURGERY.  ONLY ONE PERSON ALLOWED IN FACILITY WAITING AREA.                                     REMEMBER:  DO NOT EAT FOOD, CANDY GUM OR MINTS  AFTER MIDNIGHT . YOU MAY HAVE CLEAR LIQUIDS FROM MIDNIGHT UNTIL  800 AM. NO CLEAR LIQUIDS AFTER  800 AM DAY OF SURGERY.   YOU MAY  BRUSH YOUR TEETH MORNING OF SURGERY AND RINSE YOUR MOUTH OUT, NO CHEWING GUM CANDY OR MINTS.    CLEAR LIQUID DIET   Foods Allowed                                                                     Foods Excluded  Coffee and tea, regular and decaf                             liquids that you cannot  Plain Jell-O any favor except red or purple                                           see through such as: Fruit ices (not with fruit pulp)                                     milk, soups, orange juice  Iced Popsicles                                    All solid food Carbonated beverages, regular and diet                                    Cranberry, grape and apple juices Sports drinks like Gatorade Lightly seasoned clear broth or consume(fat free) Sugar, honey syrup  Sample Menu Breakfast                                Lunch  Supper Cranberry juice                    Beef broth                            Chicken  broth Jell-O                                     Grape juice                           Apple juice Coffee or tea                        Jell-O                                      Popsicle                                                Coffee or tea                        Coffee or tea  _____________________________________________________________________     TAKE THESE MEDICATIONS MORNING OF SURGERY WITH A SIP OF WATER: AMLODIPINE, OMEPRAZOLE, MONTLEUKAST  ONE VISITOR IS ALLOWED IN WAITING ROOM ONLY DAY OF SURGERY.  NO VISITOR MAY SPEND THE NIGHT.  VISITOR ARE ALLOWED TO STAY UNTIL 800 PM.                                    DO NOT WEAR JEWERLY, MAKE UP. DO NOT WEAR LOTIONS, POWDERS, PERFUMES OR DEODORANT. DO NOT SHAVE FOR 24 HOURS PRIOR TO DAY OF SURGERY. MEN MAY SHAVE FACE AND NECK. CONTACTS, GLASSES, OR DENTURES MAY NOT BE WORN TO SURGERY.                                    [facility] IS NOT RESPONSIBLE  FOR ANY BELONGINGS.                                                                    Marland Kitchen           [facility] - Preparing for Surgery Before surgery, you can play an important role.  Because skin is not sterile, your skin needs to be as free of germs as possible.  You can reduce the number of germs on your skin by washing with CHG (chlorahexidine gluconate) soap before surgery.  CHG is an antiseptic cleaner which kills germs and bonds with the skin to continue killing germs even after washing. Please DO NOT use if you have an allergy to CHG or antibacterial soaps.  If your skin becomes reddened/irritated  stop using the CHG and inform your nurse when you arrive at Short Stay. Do not shave (including legs and underarms) for at least 48 hours prior to the first CHG shower.  You may shave your face/neck. Please follow these instructions carefully:  1.  Shower with CHG Soap the night before surgery and the  morning of Surgery.  2.  If you choose to wash your hair, wash your hair first as  usual with your  normal  shampoo.  3.  After you shampoo, rinse your hair and body thoroughly to remove the  shampoo.                            4.  Use CHG as you would any other liquid soap.  You can apply chg directly  to the skin and wash                      Gently with a scrungie or clean washcloth.  5.  Apply the CHG Soap to your body ONLY FROM THE NECK DOWN.   Do not use on face/ open                           Wound or open sores. Avoid contact with eyes, ears mouth and genitals (private parts).                       Wash face,  Genitals (private parts) with your normal soap.             6.  Wash thoroughly, paying special attention to the area where your surgery  will be performed.  7.  Thoroughly rinse your body with warm water from the neck down.  8.  DO NOT shower/wash with your normal soap after using and rinsing off  the CHG Soap.                9.  Pat yourself dry with a clean towel.            10.  Wear clean pajamas.            11.  Place clean sheets on your bed the night of your first shower and do not  sleep with pets. Day of Surgery : Do not apply any lotions/deodorants the morning of surgery.  Please wear clean clothes to the hospital/surgery center.  FAILURE TO FOLLOW THESE INSTRUCTIONS MAY RESULT IN THE CANCELLATION OF YOUR SURGERY PATIENT SIGNATURE_________________________________  NURSE SIGNATURE__________________________________  ________________________________________________________________________                                                        QUESTIONS Emily Saunders PRE OP NURSE PHONE [phone]

## 2020-06-02 NOTE — Progress Notes (Addendum)
Spoke w/ via phone for pre-op interview---PT Lab needs dos----  URINE PREG             Lab results------has lab appt 06-05-2020 1300 for cbc bmp t & s EKG 02-06-2020 EPIC COVID test ------06-05-2020 1445 Arrive at -------900 am 06-08-2020 NPO after MN NO Solid Food.  Clear liquids from MN until---800 am then npo Med rec completed Medications to take morning of surgery -----amlodipine, omeprazole, montelukast Diabetic medication -----n/a Patient instructed to bring photo id and insurance card day of surgery Patient aware to have Driver (ride ) / caregiver friend andrea jones coming day of surgery/ driver after ower andrea jones    for 24 hours after surgery  Patient Special Instructions -----OVERNIGHT STAY INSTRUCTIONS GIVEN Pre-Op special Istructions -----none Patient verbalized understanding of instructions that were given at this phone interview. Patient denies shortness of breath, chest pain, fever, cough at this phone interview.  friened coming day of  Surgery kelly johnson has speech impediment and writes information down

## 2020-06-03 ENCOUNTER — Other Ambulatory Visit: Payer: Self-pay | Admitting: Obstetrics

## 2020-06-03 DIAGNOSIS — Z3169 Encounter for other general counseling and advice on procreation: Secondary | ICD-10-CM

## 2020-06-04 ENCOUNTER — Other Ambulatory Visit (HOSPITAL_COMMUNITY): Payer: Medicaid Other

## 2020-06-04 ENCOUNTER — Other Ambulatory Visit: Payer: Self-pay | Admitting: Obstetrics and Gynecology

## 2020-06-04 ENCOUNTER — Other Ambulatory Visit: Payer: Self-pay | Admitting: Obstetrics

## 2020-06-04 DIAGNOSIS — I10 Essential (primary) hypertension: Secondary | ICD-10-CM | POA: Diagnosis not present

## 2020-06-04 DIAGNOSIS — Z6834 Body mass index (BMI) 34.0-34.9, adult: Secondary | ICD-10-CM | POA: Diagnosis not present

## 2020-06-04 DIAGNOSIS — R635 Abnormal weight gain: Secondary | ICD-10-CM | POA: Diagnosis not present

## 2020-06-04 DIAGNOSIS — Z01818 Encounter for other preprocedural examination: Secondary | ICD-10-CM

## 2020-06-04 DIAGNOSIS — R7303 Prediabetes: Secondary | ICD-10-CM | POA: Diagnosis not present

## 2020-06-04 DIAGNOSIS — F17210 Nicotine dependence, cigarettes, uncomplicated: Secondary | ICD-10-CM | POA: Diagnosis not present

## 2020-06-04 DIAGNOSIS — N926 Irregular menstruation, unspecified: Secondary | ICD-10-CM | POA: Diagnosis not present

## 2020-06-04 DIAGNOSIS — E781 Pure hyperglyceridemia: Secondary | ICD-10-CM | POA: Diagnosis not present

## 2020-06-04 DIAGNOSIS — Z3169 Encounter for other general counseling and advice on procreation: Secondary | ICD-10-CM

## 2020-06-04 MED ORDER — POLYETHYLENE GLYCOL 3350 17 GM/SCOOP PO POWD: 17.0000 g | Freq: Every day | ORAL | 0 refills | Status: AC

## 2020-06-04 MED ORDER — IBUPROFEN 600 MG PO TABS: 600.0000 mg | ORAL_TABLET | Freq: Four times a day (QID) | ORAL | 0 refills | Status: DC | PRN

## 2020-06-04 MED ORDER — ACETAMINOPHEN 500 MG PO TABS: 500.0000 mg | ORAL_TABLET | Freq: Four times a day (QID) | ORAL | 0 refills | Status: DC | PRN

## 2020-06-04 MED ORDER — OXYCODONE HCL 5 MG PO TABS: 5.0000 mg | ORAL_TABLET | ORAL | 0 refills | Status: DC | PRN

## 2020-06-05 ENCOUNTER — Other Ambulatory Visit (HOSPITAL_COMMUNITY)
Admission: RE | Admit: 2020-06-05 | Discharge: 2020-06-05 | Disposition: A | Discharge status: Home / Self Care | Payer: Medicaid Other | Source: Ambulatory Visit | Attending: Obstetrics and Gynecology | Admitting: Obstetrics and Gynecology

## 2020-06-05 ENCOUNTER — Other Ambulatory Visit (HOSPITAL_COMMUNITY): Payer: Medicaid Other

## 2020-06-05 ENCOUNTER — Other Ambulatory Visit: Payer: Self-pay

## 2020-06-05 ENCOUNTER — Encounter (HOSPITAL_COMMUNITY)
Admission: RE | Admit: 2020-06-05 | Discharge: 2020-06-05 | Disposition: A | Discharge status: Home / Self Care | Payer: Medicaid Other | Source: Ambulatory Visit | Attending: Obstetrics and Gynecology | Admitting: Obstetrics and Gynecology

## 2020-06-05 DIAGNOSIS — Z01812 Encounter for preprocedural laboratory examination: Secondary | ICD-10-CM | POA: Insufficient documentation

## 2020-06-05 DIAGNOSIS — Z20822 Contact with and (suspected) exposure to covid-19: Secondary | ICD-10-CM | POA: Diagnosis not present

## 2020-06-05 LAB — BASIC METABOLIC PANEL
Anion gap: 7 (ref 5–15)
BUN: 11 mg/dL (ref 6–20)
CO2: 28 mmol/L (ref 22–32)
Calcium: 9.3 mg/dL (ref 8.9–10.3)
Chloride: 105 mmol/L (ref 98–111)
Creatinine, Ser: 0.86 mg/dL (ref 0.44–1.00)
GFR, Estimated: >60 mL/min (ref >60)
Glucose, Bld: 90 mg/dL (ref 70–99)
Potassium: 3.4 mmol/L — ABNORMAL LOW (ref 3.5–5.1)
Sodium: 140 mmol/L (ref 135–145)

## 2020-06-05 LAB — CBC
HCT: 39 % (ref 36.0–46.0)
Hemoglobin: 12.8 g/dL (ref 12.0–15.0)
MCH: 30 pg (ref 26.0–34.0)
MCHC: 32.8 g/dL (ref 30.0–36.0)
MCV: 91.3 fL (ref 80.0–100.0)
Platelets: 181 10*3/uL (ref 150–400)
RBC: 4.27 MIL/uL (ref 3.87–5.11)
RDW: 14.2 % (ref 11.5–15.5)
WBC: 5.4 10*3/uL (ref 4.0–10.5)
nRBC: 0 % (ref 0.0–0.2)

## 2020-06-05 NOTE — Progress Notes (Signed)
Informed pt that post op meds were sent to her pharmacy. Pt states that she has already picked them up.

## 2020-06-06 LAB — SARS CORONAVIRUS 2 (TAT 6-24 HRS): SARS Coronavirus 2: NEGATIVE

## 2020-06-08 ENCOUNTER — Encounter (HOSPITAL_BASED_OUTPATIENT_CLINIC_OR_DEPARTMENT_OTHER)
Admission: RE | Disposition: A | Discharge status: Home / Self Care | Payer: Self-pay | Source: Home / Self Care | Attending: Obstetrics and Gynecology

## 2020-06-08 ENCOUNTER — Other Ambulatory Visit: Payer: Self-pay | Admitting: Obstetrics and Gynecology

## 2020-06-08 ENCOUNTER — Encounter (HOSPITAL_BASED_OUTPATIENT_CLINIC_OR_DEPARTMENT_OTHER): Payer: Self-pay | Admitting: Obstetrics and Gynecology

## 2020-06-08 ENCOUNTER — Ambulatory Visit (HOSPITAL_BASED_OUTPATIENT_CLINIC_OR_DEPARTMENT_OTHER): Payer: Medicaid Other | Admitting: Anesthesiology

## 2020-06-08 ENCOUNTER — Ambulatory Visit (HOSPITAL_BASED_OUTPATIENT_CLINIC_OR_DEPARTMENT_OTHER)
Admission: RE | Admit: 2020-06-08 | Discharge: 2020-06-09 | Disposition: A | Discharge status: Home / Self Care | Payer: Medicaid Other | Attending: Obstetrics and Gynecology | Admitting: Obstetrics and Gynecology

## 2020-06-08 DIAGNOSIS — N840 Polyp of corpus uteri: Secondary | ICD-10-CM | POA: Diagnosis not present

## 2020-06-08 DIAGNOSIS — N816 Rectocele: Secondary | ICD-10-CM | POA: Diagnosis not present

## 2020-06-08 DIAGNOSIS — N8110 Cystocele, unspecified: Secondary | ICD-10-CM | POA: Diagnosis not present

## 2020-06-08 DIAGNOSIS — F17210 Nicotine dependence, cigarettes, uncomplicated: Secondary | ICD-10-CM | POA: Insufficient documentation

## 2020-06-08 DIAGNOSIS — N888 Other specified noninflammatory disorders of cervix uteri: Secondary | ICD-10-CM | POA: Diagnosis not present

## 2020-06-08 DIAGNOSIS — N879 Dysplasia of cervix uteri, unspecified: Secondary | ICD-10-CM | POA: Diagnosis not present

## 2020-06-08 DIAGNOSIS — I10 Essential (primary) hypertension: Secondary | ICD-10-CM | POA: Diagnosis not present

## 2020-06-08 DIAGNOSIS — E559 Vitamin D deficiency, unspecified: Secondary | ICD-10-CM | POA: Diagnosis not present

## 2020-06-08 DIAGNOSIS — D259 Leiomyoma of uterus, unspecified: Secondary | ICD-10-CM | POA: Diagnosis not present

## 2020-06-08 DIAGNOSIS — Z79899 Other long term (current) drug therapy: Secondary | ICD-10-CM | POA: Diagnosis not present

## 2020-06-08 DIAGNOSIS — N812 Incomplete uterovaginal prolapse: Secondary | ICD-10-CM | POA: Insufficient documentation

## 2020-06-08 DIAGNOSIS — K219 Gastro-esophageal reflux disease without esophagitis: Secondary | ICD-10-CM | POA: Diagnosis not present

## 2020-06-08 DIAGNOSIS — Z01818 Encounter for other preprocedural examination: Secondary | ICD-10-CM

## 2020-06-08 HISTORY — DX: Personal history of other diseases of the digestive system: Z87.19

## 2020-06-08 HISTORY — PX: CYSTOSCOPY: SHX5120

## 2020-06-08 HISTORY — DX: Gastro-esophageal reflux disease without esophagitis: K21.9

## 2020-06-08 HISTORY — PX: VAGINAL HYSTERECTOMY: SHX2639

## 2020-06-08 HISTORY — DX: Presence of dental prosthetic device (complete) (partial): Z97.2

## 2020-06-08 HISTORY — PX: VAGINAL PROLAPSE REPAIR: SHX830

## 2020-06-08 HISTORY — PX: ANTERIOR AND POSTERIOR REPAIR: SHX5121

## 2020-06-08 LAB — TYPE AND SCREEN
ABO/RH(D): A POS
Antibody Screen: NEGATIVE

## 2020-06-08 LAB — POCT PREGNANCY, URINE: Preg Test, Ur: NEGATIVE

## 2020-06-08 SURGERY — HYSTERECTOMY, VAGINAL
Anesthesia: General

## 2020-06-08 MED ORDER — LORATADINE 10 MG PO TABS
ORAL_TABLET | ORAL | Status: AC
  Filled 2020-06-08: qty 1

## 2020-06-08 MED ORDER — ONDANSETRON HCL 4 MG/2ML IJ SOLN: 4.0000 mg | Freq: Once | INTRAMUSCULAR | Status: DC | PRN

## 2020-06-08 MED ORDER — ONDANSETRON HCL 4 MG/2ML IJ SOLN
INTRAMUSCULAR | Status: AC
  Filled 2020-06-08: qty 2

## 2020-06-08 MED ORDER — DEXAMETHASONE SODIUM PHOSPHATE 10 MG/ML IJ SOLN
INTRAMUSCULAR | Status: AC
  Filled 2020-06-08: qty 1

## 2020-06-08 MED ORDER — CEFAZOLIN SODIUM-DEXTROSE 2-4 GM/100ML-% IV SOLN
2.0000 g | INTRAVENOUS | Status: AC
  Administered 2020-06-08: 2 g via INTRAVENOUS

## 2020-06-08 MED ORDER — KETAMINE HCL 50 MG/5ML IJ SOSY
PREFILLED_SYRINGE | INTRAMUSCULAR | Status: AC
  Filled 2020-06-08: qty 5

## 2020-06-08 MED ORDER — OXYCODONE HCL 5 MG PO TABS
ORAL_TABLET | ORAL | Status: AC
  Filled 2020-06-08: qty 2

## 2020-06-08 MED ORDER — OXYCODONE HCL 5 MG/5ML PO SOLN: 5.0000 mg | Freq: Once | ORAL | Status: DC | PRN

## 2020-06-08 MED ORDER — FENTANYL CITRATE (PF) 100 MCG/2ML IJ SOLN
25.0000 ug | INTRAMUSCULAR | Status: DC | PRN
  Administered 2020-06-08 (×4): 25 ug via INTRAVENOUS

## 2020-06-08 MED ORDER — ONDANSETRON HCL 4 MG/2ML IJ SOLN
INTRAMUSCULAR | Status: DC | PRN
  Administered 2020-06-08: 4 mg via INTRAVENOUS

## 2020-06-08 MED ORDER — 0.9 % SODIUM CHLORIDE (POUR BTL) OPTIME
TOPICAL | Status: DC | PRN
  Administered 2020-06-08: 500 mL

## 2020-06-08 MED ORDER — ACETAMINOPHEN 160 MG/5ML PO SOLN: 325.0000 mg | ORAL | Status: DC | PRN

## 2020-06-08 MED ORDER — PROPOFOL 10 MG/ML IV BOLUS
INTRAVENOUS | Status: AC
  Filled 2020-06-08: qty 20

## 2020-06-08 MED ORDER — DEXAMETHASONE SODIUM PHOSPHATE 10 MG/ML IJ SOLN
INTRAMUSCULAR | Status: DC | PRN
  Administered 2020-06-08: 10 mg via INTRAVENOUS

## 2020-06-08 MED ORDER — MIDAZOLAM HCL 2 MG/2ML IJ SOLN
INTRAMUSCULAR | Status: AC
  Filled 2020-06-08: qty 2

## 2020-06-08 MED ORDER — HYDROCHLOROTHIAZIDE 25 MG PO TABS
25.0000 mg | ORAL_TABLET | Freq: Every day | ORAL | Status: DC
  Filled 2020-06-08: qty 1

## 2020-06-08 MED ORDER — ACETAMINOPHEN 500 MG PO TABS
1000.0000 mg | ORAL_TABLET | ORAL | Status: AC
  Administered 2020-06-08: 1000 mg via ORAL

## 2020-06-08 MED ORDER — KETAMINE HCL 10 MG/ML IJ SOLN
INTRAMUSCULAR | Status: DC | PRN
  Administered 2020-06-08: 45 mg via INTRAVENOUS

## 2020-06-08 MED ORDER — MIDAZOLAM HCL 5 MG/5ML IJ SOLN
INTRAMUSCULAR | Status: DC | PRN
  Administered 2020-06-08: 2 mg via INTRAVENOUS

## 2020-06-08 MED ORDER — HYDROMORPHONE HCL 1 MG/ML IJ SOLN
INTRAMUSCULAR | Status: AC
  Filled 2020-06-08: qty 1

## 2020-06-08 MED ORDER — SIMETHICONE 80 MG PO CHEW: 80.0000 mg | CHEWABLE_TABLET | Freq: Four times a day (QID) | ORAL | Status: DC | PRN

## 2020-06-08 MED ORDER — OXYCODONE HCL 5 MG PO TABS
5.0000 mg | ORAL_TABLET | ORAL | Status: DC | PRN
  Administered 2020-06-08 – 2020-06-09 (×3): 10 mg via ORAL

## 2020-06-08 MED ORDER — PANTOPRAZOLE SODIUM 40 MG PO TBEC
40.0000 mg | DELAYED_RELEASE_TABLET | Freq: Every day | ORAL | Status: DC
  Administered 2020-06-08: 40 mg via ORAL

## 2020-06-08 MED ORDER — OXYCODONE HCL 5 MG PO TABS: 5.0000 mg | ORAL_TABLET | Freq: Once | ORAL | Status: DC | PRN

## 2020-06-08 MED ORDER — ONDANSETRON HCL 4 MG PO TABS: 4.0000 mg | ORAL_TABLET | Freq: Four times a day (QID) | ORAL | Status: DC | PRN

## 2020-06-08 MED ORDER — FENTANYL CITRATE (PF) 100 MCG/2ML IJ SOLN
INTRAMUSCULAR | Status: DC | PRN
  Administered 2020-06-08: 100 ug via INTRAVENOUS
  Administered 2020-06-08 (×2): 25 ug via INTRAVENOUS

## 2020-06-08 MED ORDER — LIDOCAINE 2% (20 MG/ML) 5 ML SYRINGE
INTRAMUSCULAR | Status: AC
  Filled 2020-06-08: qty 5

## 2020-06-08 MED ORDER — WATER FOR IRRIGATION, STERILE IR SOLN
Status: DC | PRN
  Administered 2020-06-08: 3000 mL

## 2020-06-08 MED ORDER — ROCURONIUM BROMIDE 10 MG/ML (PF) SYRINGE
PREFILLED_SYRINGE | INTRAVENOUS | Status: AC
  Filled 2020-06-08: qty 10

## 2020-06-08 MED ORDER — ONDANSETRON HCL 4 MG/2ML IJ SOLN: 4.0000 mg | Freq: Four times a day (QID) | INTRAMUSCULAR | Status: DC | PRN

## 2020-06-08 MED ORDER — FENTANYL CITRATE (PF) 100 MCG/2ML IJ SOLN
INTRAMUSCULAR | Status: AC
  Filled 2020-06-08: qty 2

## 2020-06-08 MED ORDER — PHENAZOPYRIDINE HCL 100 MG PO TABS
ORAL_TABLET | ORAL | Status: AC
  Filled 2020-06-08: qty 2

## 2020-06-08 MED ORDER — SUGAMMADEX SODIUM 200 MG/2ML IV SOLN
INTRAVENOUS | Status: DC | PRN
  Administered 2020-06-08: 200 mg via INTRAVENOUS

## 2020-06-08 MED ORDER — PROPOFOL 10 MG/ML IV BOLUS
INTRAVENOUS | Status: DC | PRN
  Administered 2020-06-08: 170 mg via INTRAVENOUS

## 2020-06-08 MED ORDER — PHENYLEPHRINE 40 MCG/ML (10ML) SYRINGE FOR IV PUSH (FOR BLOOD PRESSURE SUPPORT)
PREFILLED_SYRINGE | INTRAVENOUS | Status: DC | PRN
  Administered 2020-06-08: 120 ug via INTRAVENOUS
  Administered 2020-06-08: 80 ug via INTRAVENOUS

## 2020-06-08 MED ORDER — ROCURONIUM BROMIDE 10 MG/ML (PF) SYRINGE
PREFILLED_SYRINGE | INTRAVENOUS | Status: DC | PRN
  Administered 2020-06-08: 80 mg via INTRAVENOUS
  Administered 2020-06-08: 20 mg via INTRAVENOUS

## 2020-06-08 MED ORDER — GABAPENTIN 300 MG PO CAPS
300.0000 mg | ORAL_CAPSULE | ORAL | Status: AC
  Administered 2020-06-08: 300 mg via ORAL

## 2020-06-08 MED ORDER — CEFAZOLIN SODIUM-DEXTROSE 2-4 GM/100ML-% IV SOLN
INTRAVENOUS | Status: AC
  Filled 2020-06-08: qty 100

## 2020-06-08 MED ORDER — MEPERIDINE HCL 25 MG/ML IJ SOLN: 6.2500 mg | INTRAMUSCULAR | Status: DC | PRN

## 2020-06-08 MED ORDER — ACETAMINOPHEN 500 MG PO TABS
ORAL_TABLET | ORAL | Status: AC
  Filled 2020-06-08: qty 2

## 2020-06-08 MED ORDER — MONTELUKAST SODIUM 10 MG PO TABS
10.0000 mg | ORAL_TABLET | Freq: Every day | ORAL | Status: DC
  Filled 2020-06-08: qty 1

## 2020-06-08 MED ORDER — PANTOPRAZOLE SODIUM 40 MG PO TBEC
DELAYED_RELEASE_TABLET | ORAL | Status: AC
  Filled 2020-06-08: qty 1

## 2020-06-08 MED ORDER — CELECOXIB 200 MG PO CAPS
400.0000 mg | ORAL_CAPSULE | ORAL | Status: AC
  Administered 2020-06-08: 400 mg via ORAL

## 2020-06-08 MED ORDER — GABAPENTIN 300 MG PO CAPS
ORAL_CAPSULE | ORAL | Status: AC
  Filled 2020-06-08: qty 1

## 2020-06-08 MED ORDER — EPHEDRINE 5 MG/ML INJ
INTRAVENOUS | Status: AC
  Filled 2020-06-08: qty 10

## 2020-06-08 MED ORDER — FENTANYL CITRATE (PF) 250 MCG/5ML IJ SOLN
INTRAMUSCULAR | Status: AC
  Filled 2020-06-08: qty 5

## 2020-06-08 MED ORDER — GLYCOPYRROLATE PF 0.2 MG/ML IJ SOSY
PREFILLED_SYRINGE | INTRAMUSCULAR | Status: DC | PRN
  Administered 2020-06-08 (×2): .1 mg via INTRAVENOUS

## 2020-06-08 MED ORDER — LORATADINE 10 MG PO TABS
10.0000 mg | ORAL_TABLET | Freq: Every day | ORAL | Status: DC
  Administered 2020-06-08: 10 mg via ORAL

## 2020-06-08 MED ORDER — HYDROMORPHONE HCL 1 MG/ML IJ SOLN
0.2500 mg | INTRAMUSCULAR | Status: DC | PRN
  Administered 2020-06-08 (×4): 0.25 mg via INTRAVENOUS

## 2020-06-08 MED ORDER — ACETAMINOPHEN 325 MG PO TABS: 325.0000 mg | ORAL_TABLET | ORAL | Status: DC | PRN

## 2020-06-08 MED ORDER — LACTATED RINGERS IV SOLN: INTRAVENOUS | Status: DC

## 2020-06-08 MED ORDER — CELECOXIB 200 MG PO CAPS
ORAL_CAPSULE | ORAL | Status: AC
  Filled 2020-06-08: qty 2

## 2020-06-08 MED ORDER — ALBUMIN HUMAN 5 % IV SOLN: INTRAVENOUS | Status: DC | PRN

## 2020-06-08 MED ORDER — LIDOCAINE 2% (20 MG/ML) 5 ML SYRINGE
INTRAMUSCULAR | Status: DC | PRN
  Administered 2020-06-08: 80 mg via INTRAVENOUS

## 2020-06-08 MED ORDER — LIDOCAINE-EPINEPHRINE 1 %-1:100000 IJ SOLN
INTRAMUSCULAR | Status: DC | PRN
  Administered 2020-06-08: 5 mL
  Administered 2020-06-08: 4 mL
  Administered 2020-06-08: 10 mL

## 2020-06-08 MED ORDER — GLYCOPYRROLATE PF 0.2 MG/ML IJ SOSY
PREFILLED_SYRINGE | INTRAMUSCULAR | Status: AC
  Filled 2020-06-08: qty 1

## 2020-06-08 MED ORDER — IBUPROFEN 200 MG PO TABS
600.0000 mg | ORAL_TABLET | Freq: Four times a day (QID) | ORAL | Status: DC
  Administered 2020-06-08 – 2020-06-09 (×3): 600 mg via ORAL

## 2020-06-08 MED ORDER — ALBUMIN HUMAN 5 % IV SOLN
INTRAVENOUS | Status: AC
  Filled 2020-06-08: qty 250

## 2020-06-08 MED ORDER — ACETAMINOPHEN 325 MG PO TABS: 650.0000 mg | ORAL_TABLET | ORAL | Status: DC | PRN

## 2020-06-08 MED ORDER — HYDROMORPHONE HCL 1 MG/ML IJ SOLN
0.2000 mg | INTRAMUSCULAR | Status: DC | PRN
  Administered 2020-06-09: 0.6 mg via INTRAVENOUS

## 2020-06-08 MED ORDER — POLYETHYLENE GLYCOL 3350 17 G PO PACK
PACK | ORAL | Status: AC
  Filled 2020-06-08: qty 1

## 2020-06-08 MED ORDER — AMLODIPINE BESYLATE 10 MG PO TABS
10.0000 mg | ORAL_TABLET | Freq: Every day | ORAL | Status: DC
  Filled 2020-06-08: qty 1

## 2020-06-08 MED ORDER — POLYETHYLENE GLYCOL 3350 17 G PO PACK
17.0000 g | PACK | Freq: Every day | ORAL | Status: DC
  Administered 2020-06-08 – 2020-06-09 (×2): 17 g via ORAL

## 2020-06-08 MED ORDER — IBUPROFEN 200 MG PO TABS
ORAL_TABLET | ORAL | Status: AC
  Filled 2020-06-08: qty 3

## 2020-06-08 MED ORDER — POVIDONE-IODINE 10 % EX SWAB: 2.0000 "application " | Freq: Once | CUTANEOUS | Status: DC

## 2020-06-08 MED ORDER — SODIUM CHLORIDE 0.9 % IV SOLN: INTRAVENOUS | Status: DC

## 2020-06-08 MED ORDER — EPHEDRINE SULFATE-NACL 50-0.9 MG/10ML-% IV SOSY
PREFILLED_SYRINGE | INTRAVENOUS | Status: DC | PRN
  Administered 2020-06-08: 10 mg via INTRAVENOUS
  Administered 2020-06-08: 15 mg via INTRAVENOUS

## 2020-06-08 MED ORDER — PHENAZOPYRIDINE HCL 100 MG PO TABS
200.0000 mg | ORAL_TABLET | ORAL | Status: AC
  Administered 2020-06-08: 200 mg via ORAL

## 2020-06-08 MED ORDER — PHENYLEPHRINE 40 MCG/ML (10ML) SYRINGE FOR IV PUSH (FOR BLOOD PRESSURE SUPPORT)
PREFILLED_SYRINGE | INTRAVENOUS | Status: AC
  Filled 2020-06-08: qty 10

## 2020-06-08 SURGICAL SUPPLY — 60 items
AGENT HMST KT MTR STRL THRMB (HEMOSTASIS)
APL PRP STRL LF DISP 70% ISPRP (MISCELLANEOUS)
BLADE SURG 15 STRL LF DISP TIS (BLADE) ×1 IMPLANT
BLADE SURG 15 STRL SS (BLADE) ×2
BNDG GAUZE ELAST 4 BULKY (GAUZE/BANDAGES/DRESSINGS) IMPLANT
CANISTER SUCT 3000ML PPV (MISCELLANEOUS) ×2 IMPLANT
CATH FOLEY 2WAY SLVR  5CC 14FR (CATHETERS) ×2
CATH FOLEY 2WAY SLVR 5CC 14FR (CATHETERS) ×1 IMPLANT
CHLORAPREP W/TINT 26 (MISCELLANEOUS) IMPLANT
COVER WAND RF STERILE (DRAPES) ×3 IMPLANT
DECANTER SPIKE VIAL GLASS SM (MISCELLANEOUS) IMPLANT
DEVICE CAPIO SLIM SINGLE (INSTRUMENTS) ×2 IMPLANT
DRAPE STERI URO 9X17 APER PCH (DRAPES) ×1 IMPLANT
ELECT REM PT RETURN 9FT ADLT (ELECTROSURGICAL) ×2
ELECTRODE REM PT RTRN 9FT ADLT (ELECTROSURGICAL) ×1 IMPLANT
GAUZE 4X4 16PLY RFD (DISPOSABLE) ×2 IMPLANT
GAUZE PACKING 2X5 YD STRL (GAUZE/BANDAGES/DRESSINGS) ×1 IMPLANT
GLOVE SURG ENC MOIS LTX SZ6 (GLOVE) ×4 IMPLANT
GLOVE SURG UNDER POLY LF SZ6.5 (GLOVE) ×4 IMPLANT
GLOVE SURG UNDER POLY LF SZ7 (GLOVE) ×5 IMPLANT
GOWN STRL REUS W/ TWL LRG LVL3 (GOWN DISPOSABLE) ×4 IMPLANT
GOWN STRL REUS W/TWL LRG LVL3 (GOWN DISPOSABLE) ×10 IMPLANT
HIBICLENS CHG 4% 4OZ (MISCELLANEOUS) ×2 IMPLANT
KIT TURNOVER CYSTO (KITS) ×2 IMPLANT
MANIFOLD NEPTUNE II (INSTRUMENTS) ×2 IMPLANT
NDL MAYO 6 CRC TAPER PT (NEEDLE) ×1 IMPLANT
NEEDLE HYPO 22GX1.5 SAFETY (NEEDLE) ×2 IMPLANT
NEEDLE MAYO 6 CRC TAPER PT (NEEDLE) ×2 IMPLANT
NS IRRIG 1000ML POUR BTL (IV SOLUTION) ×2 IMPLANT
PACK CYSTO (CUSTOM PROCEDURE TRAY) ×2 IMPLANT
PACK VAGINAL WOMENS (CUSTOM PROCEDURE TRAY) ×2 IMPLANT
PAD OB MATERNITY 4.3X12.25 (PERSONAL CARE ITEMS) ×2 IMPLANT
PAD POSITIONING PINK XL (MISCELLANEOUS) ×1 IMPLANT
PENCIL BUTTON HOLSTER BLD 10FT (ELECTRODE) ×2 IMPLANT
RETRACTOR LONE STAR DISPOSABLE (INSTRUMENTS) ×1 IMPLANT
RETRACTOR STAY HOOK 5MM (MISCELLANEOUS) ×1 IMPLANT
SET IRRIG Y TYPE TUR BLADDER L (SET/KITS/TRAYS/PACK) ×2 IMPLANT
SURGIFLO W/THROMBIN 8M KIT (HEMOSTASIS) IMPLANT
SUT ABS MONO DBL WITH NDL 48IN (SUTURE) IMPLANT
SUT CAPIO PGA 48IN SZ 0 (SUTURE) ×2 IMPLANT
SUT CV-0 GORETEX TFX25 36 (SUTURE) IMPLANT
SUT MON AB 2-0 SH 27 (SUTURE) IMPLANT
SUT PDS AB 2-0 CT2 27 (SUTURE) IMPLANT
SUT PDS PLUS 0 (SUTURE) ×8
SUT PDS PLUS AB 0 CT-2 (SUTURE) ×1 IMPLANT
SUT VIC AB 0 CT1 18XCR BRD8 (SUTURE) IMPLANT
SUT VIC AB 0 CT1 27 (SUTURE) ×12
SUT VIC AB 0 CT1 27XBRD ANBCTR (SUTURE) IMPLANT
SUT VIC AB 0 CT1 27XCR 8 STRN (SUTURE) ×2 IMPLANT
SUT VIC AB 0 CT1 8-18 (SUTURE)
SUT VIC AB 0 SH 27 (SUTURE) IMPLANT
SUT VIC AB 2-0 SH 27 (SUTURE) ×2
SUT VIC AB 2-0 SH 27XBRD (SUTURE) IMPLANT
SUT VIC AB 3-0 SH 18 (SUTURE) IMPLANT
SUT VICRYL 2-0 SH 8X27 (SUTURE) ×2 IMPLANT
SYR 10ML LL (SYRINGE) ×2 IMPLANT
SYR BULB EAR ULCER 3OZ GRN STR (SYRINGE) ×2 IMPLANT
TOWEL OR 17X26 10 PK STRL BLUE (TOWEL DISPOSABLE) ×2 IMPLANT
TRAY FOLEY W/BAG SLVR 14FR LF (SET/KITS/TRAYS/PACK) ×1 IMPLANT
UNDERPAD 30X36 HEAVY ABSORB (UNDERPADS AND DIAPERS) ×2 IMPLANT

## 2020-06-08 NOTE — Interval H&P Note (Signed)
History and Physical Interval Note:  06/08/2020 10:34 AM  Emily Saunders  has presented today for surgery, with the diagnosis of uterovaginal prolapse, prolapse of the anterior vaginal wall, prolapse of the posterior vaginal wall,.  The various methods of treatment have been discussed with the patient and family. After consideration of risks, benefits and other options for treatment, the patient has consented to  Procedure(s): HYSTERECTOMY VAGINAL with bilateral salpingectomy (N/A) VAGINAL VAULT SUSPENSION (N/A) ANTERIOR (CYSTOCELE) AND POSTERIOR REPAIR (RECTOCELE) with perineorrhaphy (N/A) CYSTOSCOPY (N/A) as a surgical intervention.  The patient's history has been reviewed, patient examined, no change in status, stable for surgery.  I have reviewed the patient's chart and labs.  Questions were answered to the patient's satisfaction.     Jaquita Folds

## 2020-06-08 NOTE — Op Note (Signed)
Operative Note Preoperative Diagnosis: anterior vaginal prolapse, posterior vaginal prolapse and uterovaginal prolapse, incomplete  Postoperative Diagnosis: same  Procedures performed:  Total vaginal hysterectomy, bilateral salpingectomy, uterosacral ligament suspension, anterior and posterior repair with perineorrhaphy, cystoscopy  Implants: none  Attending Surgeon: Sherlene Shams, MD  Assistant Surgeon: Lyman Speller, MD   Anesthesia: General endotracheal  Findings: 1. On vaginal exam, noted stage II pelvic organ prolapse  2. On cystoscopy, normal bladder and urethra without injury or lesion, brisk bilateral efflux noted.    Specimens:  ID Type Source Tests Collected by Time Destination  1 : cervix, uterus, and bilateral fallopian tubes Tissue PATH Gyn biopsy SURGICAL PATHOLOGY Jaquita Folds, MD 06/08/2020 1132     Estimated blood loss: 200 mL  IV fluids: 1500 mL crystalloid, 281ml albumin  Urine output: 60 mL  Complications: none  Procedure in Detail:  The patient was taken to the operating room where she was placed under anesthesia.  She was then placed in the dorsal lithotomy position with Allen stirrups and prepped and draped in the usual sterile fashion.  Care was taken to avoid hyperflexion or hyperextension of her lower extremities.    A self-retaining vaginal bookwalter was placed and a foley catheter was placed.   The cervix was grasped with two single-tooth tenacula.  The cervix was injected circumferentially with 1% lidocaine with epinephrine. A 10 blade was used to incise circumferentially around the cervix.  The posterior vagina was grasped with a Kocher clamp. The Mayo scissors were used to enter the posterior cul-de-sac. Palpation confirmed peritoneal entry and no adhesions. The posterior peritoneum was affixed to the vaginal cuff with 0-Vicryl (this suture was used throughout unless otherwise specified) at the midline.A right angle clamp was  placed through the posterior colpotomy. A Heaney clamp was then used to clamp the uterosacral ligaments on each side. These were cut and suture ligated using 0-Vicryl in a Heaney fashion, and tagged with hemostats. The cardinal ligaments were clamped, cut, and suture ligated in a similar fashion on each side. Anteriorly, the bladder was dissected off the pubocervical fascia using Metzenbaum scissors. A Deaver retractor was placed anteriorly to protect the bladder. The anterior peritoneal reflection was then identified, tented up, and incised with Metzenbaum scissors to create an anterior colpotomy. Palpation confirmed peritoneal entry and no adhesions. The Deaver was placed anteriorly to protect the bladder. The uterine arteries were clamped, cut, and suture ligated. The cornua were clamped, cut, free-tied and suture ligated. The uterus and cervix were handed off the field.  Inspection of the pedicles revealed excellent hemostasis.  The left fallopian tube was grapsed with a Babcock clamp. It was then clamped with a long Kelly clamp, cut, and free-tied. This was repeated on the right side. For the uterosacral ligament suspension (USLS), the bowel was packed away with a moistened lap pad. The posterior cuff edge was grasped with an Allis clamp.  The right and left uterosacral ligaments were identified visually and digitally. Two stiches of 0 PDS was placed through each uterosacral ligament towards its insertion site at the sacrum. These were tagged with hemostats. The packing was removed.  A 70-degree cystoscope was introduced, and 360-degree inspection revealed no trauma in the bladder, with bilateral ureteral efflux with tension on the uterosacral sutures.  The bladder was drained and the cystoscope was removed.  The Foley catheter was reinserted.    For the anterior repair, two Allis clamps were placed along the midline of the anterior vaginal wall.  1% lidocaine with epinephrine was injected into the vaginal  mucosa.  A 15 blade scalpel was used to incise the vaginal mucosa in the midline. Allis clamps were placed along this incision and Metzenbaum scissors were used to sharply dissect the epithelium off of the vesicovaginal septum bilaterally to the level of the pubic rami. Anterior plication of the vesicovaginal septum was then performed using 2-0 Vicryl. The vaginal mucosal edges were trimmed and the incision reapproximated with 2-0 Vicryl in a running locked fashion. Hemostasis was noted. The uterosacral stitches were then attached to the posterior and anterior edges of the vaginal cuff on the ipsilateral sides, in a through and through fashion, using a free needle. Figure of eight sutures of 0-Vicryl were placed through the vaginal cuff and tied down. The medial uterosacral stitches were then tied down with good apical support noted. The Foley catheter was removed. A 70-degree cystoscope was introduced, and 360-degree inspection revealed no trauma in the bladder, with bilateral ureteral efflux with tension on the uterosacral sutures.  The bladder was drained and the cystoscope was removed.  The lateral uterosacral sutures were tied down and the cystoscopy was repeated. Brisk bilateral ureteral efflux was noted. The cystoscope was removed and the Foley catheter was reinserted.  Attention was then turned to the posterior vagina.  Two Allis clamps were in the midline of the posterior vaginal wall defect.  1% lidocaine with epinephrine was injected into the vaginal mucosa. A vertical incision was made between these clamps with a 15 blade scalpel and a diamond shaped area of epithelium was cut at the introitus. The perineal skin was removed with a scalpel. The rectovaginal septum was then dissected off the vaginal mucosa bilaterally. The rectovaginal septum was then plicated with vertical mattress sutures of 2-0 Vicryl.  After placement of the first plication stitch two fingers were inserted into the vaginal to confirm  adequate caliber.  The last distal stitch incorporated the perineal body in a U stitch fashion. After plication, the excess vaginal mucosa was trimmed and the vaginal mucosa was reapproximated using 2-0 Vicryl sutures. The bulbocavernosus muscles and perineal muscles were reapproximated using three interrupted stitches of 0 Vicryl and the hymenal ring was redeveloped. The perineal skin was then closed with 2-0 vicryl in a subcuticular fashion.   The vagina was copiously irrigated.  Hemostasis was noted. A rectal examination was normal and confirmed no sutures within the rectum.  The patient tolerated the procedure well.  She was awakened from anesthesia and transferred to the recovery room in stable condition. All counts were correct x 2.    Jaquita Folds, MD

## 2020-06-08 NOTE — Discharge Instructions (Signed)
POST OPERATIVE INSTRUCTIONS  General Instructions . Recovery (not bed rest) will last approximately 6 weeks . Walking is encouraged, but refrain from strenuous exercise/ housework/ heavy lifting. o No lifting >10lbs  . Nothing in the vagina- NO intercourse, tampons or douching . Bathing:  Do not submerge in water (NO swimming, bath, hot tub, etc) until after your postop visit. You can shower starting the day after surgery.  . No driving until you are not taking narcotic pain medicine and until your pain is well enough controlled that you can slam on the breaks or make sudden movements if needed.   Taking your medications 1. Please take your acetaminophen and ibuprofen on a schedule for the first 48 hours. Take 600mg ibuprofen, then take 500mg acetaminophen 3 hours later, then continue to alternate ibuprofen and acetaminophen. That way you are taking each type of medication every 6 hours. 2. Take the prescribed narcotic (oxycodone, tramadol, etc) as needed, with a maximum being every 4 hours.  3. Take a stool softener daily to keep your stools soft and preventing you from straining. If you have diarrhea, you decrease your stool softener. This is explained more below. We have prescribed you Miralax.  Reasons to Call the Nurse (see last page for phone numbers) . Heavy Bleeding (changing your pad every 1-2 hours) . Persistent nausea/vomiting . Fever (100.4 degrees or more) . Incision problems (pus or other fluid coming out, redness, warmth, increased pain)  Things to Expect After Surgery . Mild to Moderate pain is normal during the first day or two after surgery. If prescribed, take Ibuprofen or Tylenol first and use the stronger medicine for "break-through" pain. You can overlap these medicines because they work differently.   . Constipation   . To Prevent Constipation:  Eat a well-balanced diet including protein, grains, fresh fruit and vegetables.  Drink plenty of fluids. Walk regularly.   Depending on specific instructions from your physician: take Miralax daily and additionally you can add a stool softener (colace/ docusate) and fiber supplement. Continue as long as you're on pain medications.   . To Treat Constipation:  If you do not have a bowel movement in 2 days after surgery, you can take 2 Tbs of Milk of Magnesia 1-2 times a day until you have a bowel movement. If diarrhea occurs, decrease the amount or stop the laxative. If no results with Milk of Magnesia, you can drink a bottle of magnesium citrate which you can purchase over the counter.  . Fatigue:  This is a normal response to surgery and will improve with time.  Plan frequent rest periods throughout the day.  . Gas Pain:  This is very common but can also be very painful! Drink warm liquids such as herbal teas, bouillon or soup. Walking will help you pass more gas.  Mylicon or Gas-X can be taken over the counter.  . Leaking Urine:  Varying amounts of leakage may occur after surgery.  This should improve with time. Your bladder needs at least 3 months to recover from surgery. If you leak after surgery, be sure to mention this to your doctor at your post-op visit. If you were taking medications for overactive bladder prior to surgery, be sure to restart the medications immediately after surgery.  . Incisions: If you have incisions on your abdomen, the skin glue will dissolve on its own over time. It is ok to gently rinse with soap and water over these incisions but do not scrub.  Catheter Approximately 50%   of patients are unable to urinate after surgery and need to go home with a catheter. This allows your bladder to rest so it can return to full function. If you go home with a catheter, the office will call to set up a voiding trial a few days after surgery. For most patients, by this visit, they are able to urinate on their own. Long term catheter use is rare.   Return to Work  As work demands and recovery times vary  widely, it is hard to predict when you will want to return to work. If you have a desk job with no strenuous physical activity, and if you would like to return sooner than generally recommended, discuss this with your provider or call our office.   Post op concerns  For non-emergent issues, please call the Urogynecology Nurse. Please leave a message and someone will contact you within one business day.  You can also send a message through MyChart.   AFTER HOURS (After 5:00 PM and on weekends):  For urgent matters that cannot wait until the next business day. Call our office [phone] and connect to the doctor on call.  Please reserve this for important issues.   **FOR ANY TRUE EMERGENCY ISSUES CALL 911 OR GO TO THE NEAREST EMERGENCY ROOM.** Please inform our office or the doctor on call of any emergency.     APPOINTMENTS: Call [phone]   

## 2020-06-08 NOTE — Transfer of Care (Signed)
Immediate Anesthesia Transfer of Care Note  Patient: Emily Saunders  Procedure(s) Performed: HYSTERECTOMY VAGINAL with bilateral salpingectomy (N/A ) UTEROSACRAL SUSPENSION (N/A ) ANTERIOR (CYSTOCELE) AND POSTERIOR REPAIR (RECTOCELE) with perineorrhaphy (N/A ) CYSTOSCOPY (N/A )  Patient Location: PACU  Anesthesia Type:General  Level of Consciousness: drowsy, patient cooperative and responds to stimulation  Airway & Oxygen Therapy: Patient Spontanous Breathing and Patient connected to face mask oxygen  Post-op Assessment: Report given to RN and Post -op Vital signs reviewed and stable  Post vital signs: Reviewed and stable  Last Vitals:  Vitals Value Taken Time  BP 120/72 06/08/20 1352  Temp    Pulse    Resp 29 06/08/20 1357  SpO2    Vitals shown include unvalidated device data.  Last Pain:  Vitals:   06/08/20 0904  TempSrc: Oral  PainSc: 0-No pain      Patients Stated Pain Goal: 5 (86/57/84 6962)  Complications: No complications documented.

## 2020-06-08 NOTE — Anesthesia Preprocedure Evaluation (Addendum)
Anesthesia Evaluation  Patient identified by MRN, date of birth, ID band Patient awake    Reviewed: Allergy & Precautions, NPO status , Patient's Chart, lab work & pertinent test results  Airway Mallampati: I  TM Distance: >3 FB     Dental  (+) Edentulous Upper, Upper Dentures, Partial Lower, Missing,    Pulmonary pneumonia, resolved, Current Smoker and Patient abstained from smoking.,    breath sounds clear to auscultation       Cardiovascular hypertension, Pt. on medications  Rhythm:Regular Rate:Normal     Neuro/Psych    GI/Hepatic hiatal hernia, GERD  Medicated and Controlled,  Endo/Other  Morbid obesity  Renal/GU      Musculoskeletal   Abdominal (+) + obese,   Peds  Hematology  (+) Blood dyscrasia, anemia ,   Anesthesia Other Findings   Reproductive/Obstetrics                            Anesthesia Physical  Anesthesia Plan  ASA: II  Anesthesia Plan: General   Post-op Pain Management:    Induction: Intravenous  PONV Risk Score and Plan: 2 and Ondansetron and Dexamethasone  Airway Management Planned: Oral ETT and LMA  Additional Equipment: None  Intra-op Plan:   Post-operative Plan: Extubation in OR  Informed Consent: I have reviewed the patients History and Physical, chart, labs and discussed the procedure including the risks, benefits and alternatives for the proposed anesthesia with the patient or authorized representative who has indicated his/her understanding and acceptance.     Dental advisory given  Plan Discussed with: CRNA and Anesthesiologist  Anesthesia Plan Comments:         Anesthesia Quick Evaluation

## 2020-06-08 NOTE — Anesthesia Procedure Notes (Signed)
Procedure Name: Intubation Date/Time: 06/08/2020 10:59 AM Performed by: Rogers Blocker, CRNA Pre-anesthesia Checklist: Patient identified, Emergency Drugs available, Suction available and Patient being monitored Patient Re-evaluated:Patient Re-evaluated prior to induction Oxygen Delivery Method: Circle System Utilized Preoxygenation: Pre-oxygenation with 100% oxygen Induction Type: IV induction Ventilation: Mask ventilation without difficulty Laryngoscope Size: Mac and 3 Grade View: Grade I Tube type: Oral Number of attempts: 1 Airway Equipment and Method: Stylet Placement Confirmation: ETT inserted through vocal cords under direct vision,  positive ETCO2 and breath sounds checked- equal and bilateral Secured at: 22 cm Tube secured with: Tape Dental Injury: Teeth and Oropharynx as per pre-operative assessment

## 2020-06-09 ENCOUNTER — Encounter (HOSPITAL_BASED_OUTPATIENT_CLINIC_OR_DEPARTMENT_OTHER): Payer: Self-pay | Admitting: Obstetrics and Gynecology

## 2020-06-09 DIAGNOSIS — N812 Incomplete uterovaginal prolapse: Secondary | ICD-10-CM | POA: Diagnosis not present

## 2020-06-09 MED ORDER — OXYCODONE HCL 5 MG PO TABS
ORAL_TABLET | ORAL | Status: AC
  Filled 2020-06-09: qty 2

## 2020-06-09 MED ORDER — HYDROMORPHONE HCL 1 MG/ML IJ SOLN
INTRAMUSCULAR | Status: AC
  Filled 2020-06-09: qty 1

## 2020-06-09 MED ORDER — IBUPROFEN 200 MG PO TABS
ORAL_TABLET | ORAL | Status: AC
  Filled 2020-06-09: qty 3

## 2020-06-09 MED ORDER — POLYETHYLENE GLYCOL 3350 17 G PO PACK
PACK | ORAL | Status: AC
  Filled 2020-06-09: qty 1

## 2020-06-09 NOTE — Progress Notes (Signed)
150 cc of NS instilled in bladder via catheter.  Pt up voided 125 cc of urine back to be bladder scanned for 30 cc.  Dr. Wannetta Sender updated on voiding and pain

## 2020-06-09 NOTE — Progress Notes (Signed)
1 Day Post-Op Procedure(s) (LRB): HYSTERECTOMY VAGINAL with bilateral salpingectomy (N/A) UTEROSACRAL SUSPENSION (N/A) ANTERIOR (CYSTOCELE) AND POSTERIOR REPAIR (RECTOCELE) with perineorrhaphy (N/A) CYSTOSCOPY (N/A)  Subjective: Patient reports she has pain in lower abdomen but it has been overall well controlled.  Tolerating regular diet. Ambulating well in room.   Objective: Vitals:   06/09/20 0020 06/09/20 0600  BP: 105/62 (!) 98/57  Pulse: 67 62  Resp: 18 18  Temp: 98 F (36.7 C) 98.3 F (36.8 C)  SpO2: 96% 97%     General: alert and cooperative Resp: clear to auscultation bilaterally Cardio: regular rate and rhythm, S1, S2 normal, no murmur, click, rub or gallop GI: soft, non-tender; bowel sounds normal; no masses,  no organomegaly Extremities: extremities normal, atraumatic, no cyanosis or edema Vaginal Bleeding: minimal  Assessment: s/p Procedure(s): HYSTERECTOMY VAGINAL with bilateral salpingectomy (N/A) UTEROSACRAL SUSPENSION (N/A) ANTERIOR (CYSTOCELE) AND POSTERIOR REPAIR (RECTOCELE) with perineorrhaphy (N/A) CYSTOSCOPY (N/A): stable  Plan: - Regular diet - Encourage ambulation - Pain management- ibuprofen and tylenol scheduled with oxycodone as needed - Discontinue IV fluids - Bowel regimen: miralax daily - Discharge home  LOS: 0 days    Emily Saunders 06/09/2020, 7:52 AM

## 2020-06-09 NOTE — Anesthesia Postprocedure Evaluation (Signed)
Anesthesia Post Note  Patient: Emily Saunders  Procedure(s) Performed: HYSTERECTOMY VAGINAL with bilateral salpingectomy (N/A ) UTEROSACRAL SUSPENSION (N/A ) ANTERIOR (CYSTOCELE) AND POSTERIOR REPAIR (RECTOCELE) with perineorrhaphy (N/A ) CYSTOSCOPY (N/A )     Patient location during evaluation: PACU Anesthesia Type: General Level of consciousness: awake and alert Pain management: pain level controlled Vital Signs Assessment: post-procedure vital signs reviewed and stable Respiratory status: spontaneous breathing, nonlabored ventilation, respiratory function stable and patient connected to nasal cannula oxygen Cardiovascular status: blood pressure returned to baseline and stable Postop Assessment: no apparent nausea or vomiting Anesthetic complications: no   No complications documented.  Last Vitals:  Vitals:   06/09/20 0600 06/09/20 0919  BP: (!) 98/57 (!) 101/56  Pulse: 62 64  Resp: 18 18  Temp: 36.8 C 36.7 C  SpO2: 97% 98%    Last Pain:  Vitals:   06/09/20 0919  TempSrc: Oral  PainSc:                  Ahmod Gillespie

## 2020-06-10 ENCOUNTER — Telehealth: Payer: Self-pay | Admitting: *Deleted

## 2020-06-10 LAB — SURGICAL PATHOLOGY

## 2020-06-10 NOTE — Telephone Encounter (Signed)
.  Post- Op Call  Emily Saunders underwent TVH with bilateral salpingectomy, uterosacral suspension, A&P repair, and cystoscopy on 06/08/20 with Dr Wannetta Sender. The patient states she is having some increased discomfort. She is taking Tylenol and Ibuprofen alternating and also taking oxycodone every 4 hours. Advised pt that the oxycodone can cause constipation. She reports vaginal bleeding that is minimal.  She has not had a bowel movement and is taking miralax and a stool softener for a bowel regimen. She states that she feels like she needs to have a bowel movement but cant. Advised that she could take magnesium citrate. Advised to drink the whole bottle slowly.  She was discharged without a catheter.  States that she is voiding fine. Advised to try the mag citrate and to call us back tomorrow if she has not had a bowel movement and if her pain has not improved. Pt verbalized understanding.    Blenda Nicely, RN

## 2020-06-11 ENCOUNTER — Encounter: Payer: Self-pay | Admitting: Obstetrics and Gynecology

## 2020-06-11 ENCOUNTER — Telehealth: Payer: Self-pay | Admitting: Obstetrics and Gynecology

## 2020-06-11 NOTE — Telephone Encounter (Signed)
error 

## 2020-06-16 ENCOUNTER — Telehealth: Payer: Self-pay | Admitting: Obstetrics and Gynecology

## 2020-06-16 NOTE — Telephone Encounter (Signed)
DOB verified. Pt called stating that she is having some vaginal discharge.  Her surgery was 8 days ago. She describes it as thin with some brown tinge. Advised that some discharge can be normal as long as it is not greenish in color or accompanied by odor or vaginal itching. Pt denies any of those. Advised to call us back if anything changes.

## 2020-06-22 ENCOUNTER — Ambulatory Visit: Payer: Medicaid Other | Admitting: Obstetrics and Gynecology

## 2020-06-22 ENCOUNTER — Encounter (HOSPITAL_BASED_OUTPATIENT_CLINIC_OR_DEPARTMENT_OTHER): Payer: Self-pay

## 2020-06-22 ENCOUNTER — Encounter (HOSPITAL_BASED_OUTPATIENT_CLINIC_OR_DEPARTMENT_OTHER): Payer: Self-pay | Admitting: Obstetrics & Gynecology

## 2020-06-22 ENCOUNTER — Other Ambulatory Visit: Payer: Self-pay

## 2020-06-22 ENCOUNTER — Ambulatory Visit (INDEPENDENT_AMBULATORY_CARE_PROVIDER_SITE_OTHER): Payer: Medicaid Other | Admitting: Obstetrics & Gynecology

## 2020-06-22 VITALS — BP 105/85 | HR 79 | Wt 206.0 lb

## 2020-06-22 DIAGNOSIS — N898 Other specified noninflammatory disorders of vagina: Secondary | ICD-10-CM

## 2020-06-22 DIAGNOSIS — Z9889 Other specified postprocedural states: Secondary | ICD-10-CM

## 2020-06-22 MED ORDER — LIDOCAINE 5 % EX OINT: 1.0000 "application " | TOPICAL_OINTMENT | Freq: Three times a day (TID) | CUTANEOUS | 0 refills | Status: DC

## 2020-06-22 MED ORDER — METRONIDAZOLE 500 MG PO TABS: 500.0000 mg | ORAL_TABLET | Freq: Two times a day (BID) | ORAL | 0 refills | Status: DC

## 2020-06-22 NOTE — Telephone Encounter (Signed)
Per Dr. Sabra Heck patient was advised not to use dermablast. tbw

## 2020-06-22 NOTE — Progress Notes (Signed)
GYNECOLOGY  VISIT  CC:   post op recheck  HPI: 45 y.o. [id number] Single Black or African American female here for recheck after undergoing TVH/uterosacral ligament suspension, A&P repair, cystoscopy.  She reports she is still having pain in her buttocks and near the vagina.  Still taking ibuprofen.  No vaginal bleeding.  Thinks she has discharge but not sure if she is leaking urine.  Bowel function is back to normal.  She has chronic constipation.  Using Linzess and miralax to have a bowel movement every other day.  Bowel movements are soft.  No abnormal urine odor but having pain when starts stream of urine.   Pathology reviewed:  Yes .  Questions answered.    MEDS:   Current Outpatient Medications on File Prior to Visit  Medication Sig Dispense Refill  . acetaminophen (TYLENOL) 500 MG tablet Take 1 tablet (500 mg total) by mouth every 6 (six) hours as needed (pain). 30 tablet 0  . amLODipine (NORVASC) 10 MG tablet Take 1 tablet (10 mg total) by mouth daily. 30 tablet 11  . Blood Pressure Monitoring (BLOOD PRESSURE KIT) DEVI 1 mL by Does not apply route 3 (three) times daily. 1 Bag 0  . cetirizine (ZYRTEC) 10 MG tablet Take 10 mg by mouth at bedtime.    . hydrochlorothiazide (HYDRODIURIL) 25 MG tablet Take 1 tablet (25 mg total) by mouth daily. 30 tablet 11  . ibuprofen (ADVIL) 600 MG tablet Take 1 tablet (600 mg total) by mouth every 6 (six) hours as needed. 30 tablet 0  . montelukast (SINGULAIR) 10 MG tablet Take 10 mg by mouth daily.    Marland Kitchen omeprazole (PRILOSEC) 40 MG capsule Take 40 mg by mouth daily.    Marland Kitchen oxyCODONE (OXY IR/ROXICODONE) 5 MG immediate release tablet Take 1 tablet (5 mg total) by mouth every 4 (four) hours as needed for severe pain. 15 tablet 0  . OZEMPIC, 0.25 OR 0.5 MG/DOSE, 2 MG/1.5ML SOPN TUESDAY    . polyethylene glycol powder (GLYCOLAX/MIRALAX) 17 GM/SCOOP powder Take 17 g by mouth daily. Drink 17g (1 scoop) dissolved in water per day. 17 g 0  . Prenat-Fe  Poly-Methfol-FA-DHA (VITAFOL ULTRA) 29-0.6-0.4-200 MG CAPS TAKE 1 CAPSULE BY MOUTH DAILY BEFORE BREAKFAST 30 capsule 11  . Vitamin D, Ergocalciferol, (DRISDOL) 1.25 MG (50000 UNIT) CAPS capsule Take 1 capsule by mouth once a week. TUESDAYS     No current facility-administered medications on file prior to visit.    SH:  Smoking Yes    PHYSICAL EXAMINATION:    BP 105/85   Pulse 79   Wt 206 lb (93.4 kg)   LMP 06/01/2020   BMI 33.25 kg/m     General appearance: alert, cooperative and appears stated age CV:  Regular rate and rhythm Lungs:  clear to auscultation, no wheezes, rales or rhonchi, symmetric air entry Abdomen: soft, non-tender; bowel sounds normal; no masses,  no organomegaly Incisions:  C/D/I  Pelvic: External genitalia:  no lesions, tender to palpation along perineal body where posterior repair occurred, no erythema or drainage              Urethra:  normal appearing urethra with no masses, tenderness or lesions              Bartholins and Skenes: normal                 Vagina: normal appearing vagina with intact sutures, yellowish discharge present with mild odor, no blood  Cervix: absent              Bimanual Exam:  Uterus:  uterus absent, mild tenderness on exam  Chaperone, Octaviano Batty, was present for exam.  Assessment/Plan: 1. Post-operative state - recheck 2-3 weeks - encouraged to continue ibuprofen - lidocaine (XYLOCAINE) 5 % ointment; Apply 1 application topically 3 (three) times daily.  Dispense: 30 g; Refill: 0.  Apply to perineal body where there is tenderness  2. Vaginal discharge - metroNIDAZOLE (FLAGYL) 500 MG tablet; Take 1 tablet (500 mg total) by mouth 2 (two) times daily.  Dispense: 14 tablet; Refill: 0

## 2020-06-24 ENCOUNTER — Encounter (INDEPENDENT_AMBULATORY_CARE_PROVIDER_SITE_OTHER): Payer: Medicaid Other | Admitting: Ophthalmology

## 2020-06-30 ENCOUNTER — Other Ambulatory Visit: Payer: Self-pay

## 2020-06-30 ENCOUNTER — Ambulatory Visit (INDEPENDENT_AMBULATORY_CARE_PROVIDER_SITE_OTHER): Payer: Medicaid Other | Admitting: Obstetrics & Gynecology

## 2020-06-30 ENCOUNTER — Encounter (HOSPITAL_BASED_OUTPATIENT_CLINIC_OR_DEPARTMENT_OTHER): Payer: Self-pay | Admitting: Obstetrics & Gynecology

## 2020-06-30 VITALS — BP 120/85 | HR 70 | Wt 204.0 lb

## 2020-06-30 DIAGNOSIS — Z9889 Other specified postprocedural states: Secondary | ICD-10-CM

## 2020-06-30 NOTE — Progress Notes (Signed)
GYNECOLOGY  VISIT  CC:   post op recheck  HPI: 45 y.o. [id number] Single Black or African American female here for recheck after undergoing vaginal hysterectomy, vaginal vault suspension, A & P repair and cystoscopy on 06/08/2020.  She reports bleeding has stopped.  Pain is much improved.  Not taking anything for pain at this time.  Bowel function is Normal.  Bladder function is normal.  Vaginal discharge has decreased significantly.  Feeling much better.  Pathology reviewed:  Yes .    MEDS:   Current Outpatient Medications on File Prior to Visit  Medication Sig Dispense Refill  . acetaminophen (TYLENOL) 500 MG tablet Take 1 tablet (500 mg total) by mouth every 6 (six) hours as needed (pain). 30 tablet 0  . amLODipine (NORVASC) 10 MG tablet Take 1 tablet (10 mg total) by mouth daily. 30 tablet 11  . Blood Pressure Monitoring (BLOOD PRESSURE KIT) DEVI 1 mL by Does not apply route 3 (three) times daily. 1 Bag 0  . cetirizine (ZYRTEC) 10 MG tablet Take 10 mg by mouth at bedtime.    . hydrochlorothiazide (HYDRODIURIL) 25 MG tablet Take 1 tablet (25 mg total) by mouth daily. 30 tablet 11  . lidocaine (XYLOCAINE) 5 % ointment Apply 1 application topically 3 (three) times daily. 30 g 0  . metroNIDAZOLE (FLAGYL) 500 MG tablet Take 1 tablet (500 mg total) by mouth 2 (two) times daily. 14 tablet 0  . montelukast (SINGULAIR) 10 MG tablet Take 10 mg by mouth daily.    Marland Kitchen omeprazole (PRILOSEC) 40 MG capsule Take 40 mg by mouth daily.    Marland Kitchen OZEMPIC, 0.25 OR 0.5 MG/DOSE, 2 MG/1.5ML SOPN TUESDAY    . polyethylene glycol powder (GLYCOLAX/MIRALAX) 17 GM/SCOOP powder Take 17 g by mouth daily. Drink 17g (1 scoop) dissolved in water per day. 17 g 0  . Prenat-Fe Poly-Methfol-FA-DHA (VITAFOL ULTRA) 29-0.6-0.4-200 MG CAPS TAKE 1 CAPSULE BY MOUTH DAILY BEFORE BREAKFAST 30 capsule 11  . Vitamin D, Ergocalciferol, (DRISDOL) 1.25 MG (50000 UNIT) CAPS capsule Take 1 capsule by mouth once a week. TUESDAYS     No current  facility-administered medications on file prior to visit.    SH:  Smoking Yes    PHYSICAL EXAMINATION:    BP 120/85   Pulse 70   Wt 204 lb (92.5 kg)   LMP 06/01/2020   BMI 32.93 kg/m     General appearance: alert, cooperative and appears stated age Abdomen: soft, non-tender; bowel sounds normal; no masses,  no organomegaly  Pelvic: External genitalia:  no lesions              Urethra:  normal appearing urethra with no masses, tenderness or lesions              Bartholins and Skenes: normal                 Vagina: normal appearing vagina with normal color and discharge, no lesions, suture lines intact, visible suture still present, good vault support              Cervix: absent              Bimanual Exam:  Uterus:  uterus absent              Adnexa: no mass, fullness, tenderness  Chaperone, Octaviano Batty, CMA, was present for exam.  Assessment/Plan: 1. Post-operative state - Doing well.  Much improved from prior visit.  Recheck 3 weeks prior to returning to  work.

## 2020-07-03 ENCOUNTER — Ambulatory Visit (HOSPITAL_BASED_OUTPATIENT_CLINIC_OR_DEPARTMENT_OTHER): Payer: Medicaid Other | Admitting: Obstetrics & Gynecology

## 2020-07-12 ENCOUNTER — Encounter (HOSPITAL_BASED_OUTPATIENT_CLINIC_OR_DEPARTMENT_OTHER): Payer: Self-pay

## 2020-07-13 NOTE — Telephone Encounter (Signed)
Called pt in regards to myChart message regarding post op pain. Pt describes the pain as stabbing, constant. She describes it as feeling as though she has wiped too hard or had jeans on that were too tight for too long. She states that she has had this pain for about a week and ibuprofen doesn't help. She denies bleeding. She states that she is voiding and passing stool sufficiently. She states that it is similar to the pain she had right after surgery. Pt given appt for evaluation by Dr. Sabra Heck. Advised to call us back if she felt the pain was getting worse and needed to be seen sooner than Wednesday. Pt verbalized understanding.

## 2020-07-14 ENCOUNTER — Other Ambulatory Visit: Payer: Self-pay

## 2020-07-14 ENCOUNTER — Other Ambulatory Visit (HOSPITAL_COMMUNITY)
Admission: RE | Admit: 2020-07-14 | Discharge: 2020-07-14 | Disposition: A | Discharge status: Home / Self Care | Payer: Medicaid Other | Source: Ambulatory Visit | Attending: Obstetrics & Gynecology | Admitting: Obstetrics & Gynecology

## 2020-07-14 ENCOUNTER — Ambulatory Visit (INDEPENDENT_AMBULATORY_CARE_PROVIDER_SITE_OTHER): Payer: Medicaid Other | Admitting: Obstetrics & Gynecology

## 2020-07-14 VITALS — BP 117/86 | HR 76 | Ht 66.0 in | Wt 207.4 lb

## 2020-07-14 DIAGNOSIS — Z9889 Other specified postprocedural states: Secondary | ICD-10-CM

## 2020-07-14 DIAGNOSIS — N898 Other specified noninflammatory disorders of vagina: Secondary | ICD-10-CM

## 2020-07-14 NOTE — Telephone Encounter (Signed)
Patient called this morning with complaint of "feeling like a stick is in her or poking her". She states that it hurts for her to even wash her private area. She said she spoke with someone yesterday and they told her if things did not get any better to call our office back. Please advise.

## 2020-07-14 NOTE — Progress Notes (Signed)
GYNECOLOGY  VISIT  CC:   Vulvar pain  HPI: 45 y.o. [id number] Single Black or African American female here for complaint of vulvar pain, pain with sitting that started after sneezing about a week or so ago.  Felt than that she might have pulled something.  The pain is right at that vagina so sitting is definitively uncomfortable.  Has been feeling and almost feels like there is something sticking out.  Has been taking ibuprofen and tylenol and doesn't really feel like this helps.  Worried she did something.  Bowel movements are normal.  Had one yesterday.  Denies urinary complaints.  Denies fever.  Still having some discharge.   Patient Active Problem List   Diagnosis Date Noted  . Pain in left knee 09/11/2019  . HTN (hypertension) 02/27/2015  . Unintended weight loss 10/27/2014  . Allergic rhinitis 06/23/2014  . Vitamin D insufficiency 03/03/2014  . Pilonidal cyst 03/03/2014  . Broken teeth 03/03/2014  . Poor sleep pattern 03/03/2014  . Obesity (BMI 30-39.9) 03/03/2014  . Current smoker 03/03/2014  . History of anemia 05/27/2013    Past Medical History:  Diagnosis Date  . GERD (gastroesophageal reflux disease)   . H/O pilonidal cyst 2002   . History of hiatal hernia   . Hypertension   . PID (acute pelvic inflammatory disease) 2006   . Pneumonia 2016  . Vitamin D deficiency 2015   . Wears dentures    FULL SET    Past Surgical History:  Procedure Laterality Date  . ANTERIOR AND POSTERIOR REPAIR N/A 06/08/2020   Procedure: ANTERIOR (CYSTOCELE) AND POSTERIOR REPAIR (RECTOCELE) with perineorrhaphy;  Surgeon: Jaquita Folds, MD;  Location: [place];  Service: Gynecology;  Laterality: N/A;  . CESAREAN SECTION     X 1  . CYST EXCISION Right 2003   hand  . CYSTOSCOPY N/A 06/08/2020   Procedure: CYSTOSCOPY;  Surgeon: Jaquita Folds, MD;  Location: [facility];  Service: Gynecology;  Laterality: N/A;  . NASAL SINUS SURGERY Bilateral 02/10/2020    Procedure: ENDOSCOPIC SINUS SURGERY Maxillary Anstrostomy with Tissue Removal;  Surgeon: Izora Gala, MD;  Location: [place];  Service: ENT;  Laterality: Bilateral;  . VAGINAL HYSTERECTOMY N/A 06/08/2020   Procedure: HYSTERECTOMY VAGINAL with bilateral salpingectomy;  Surgeon: Jaquita Folds, MD;  Location: [facility];  Service: Gynecology;  Laterality: N/A;  . VAGINAL PROLAPSE REPAIR N/A 06/08/2020   Procedure: UTEROSACRAL SUSPENSION;  Surgeon: Jaquita Folds, MD;  Location: [facility];  Service: Gynecology;  Laterality: N/A;    MEDS:   Current Outpatient Medications on File Prior to Visit  Medication Sig Dispense Refill  . acetaminophen (TYLENOL) 500 MG tablet Take 1 tablet (500 mg total) by mouth every 6 (six) hours as needed (pain). 30 tablet 0  . amLODipine (NORVASC) 10 MG tablet Take 1 tablet (10 mg total) by mouth daily. 30 tablet 11  . cetirizine (ZYRTEC) 10 MG tablet Take 10 mg by mouth at bedtime.    . hydrochlorothiazide (HYDRODIURIL) 25 MG tablet Take 1 tablet (25 mg total) by mouth daily. 30 tablet 11  . lidocaine (XYLOCAINE) 5 % ointment Apply 1 application topically 3 (three) times daily. 30 g 0  . montelukast (SINGULAIR) 10 MG tablet Take 10 mg by mouth daily.    Marland Kitchen omeprazole (PRILOSEC) 40 MG capsule Take 40 mg by mouth daily.    Marland Kitchen OZEMPIC, 0.25 OR 0.5 MG/DOSE, 2 MG/1.5ML SOPN TUESDAY    .  polyethylene glycol powder (GLYCOLAX/MIRALAX) 17 GM/SCOOP powder Take 17 g by mouth daily. Drink 17g (1 scoop) dissolved in water per day. 17 g 0  . Vitamin D, Ergocalciferol, (DRISDOL) 1.25 MG (50000 UNIT) CAPS capsule Take 1 capsule by mouth once a week. TUESDAYS    . Blood Pressure Monitoring (BLOOD PRESSURE KIT) DEVI 1 mL by Does not apply route 3 (three) times daily. 1 Bag 0  . metroNIDAZOLE (FLAGYL) 500 MG tablet Take 1 tablet (500 mg total) by mouth 2 (two) times daily. (Patient not taking: Reported on 07/14/2020) 14 tablet 0   . Prenat-Fe Poly-Methfol-FA-DHA (VITAFOL ULTRA) 29-0.6-0.4-200 MG CAPS TAKE 1 CAPSULE BY MOUTH DAILY BEFORE BREAKFAST (Patient not taking: Reported on 07/14/2020) 30 capsule 11   No current facility-administered medications on file prior to visit.    ALLERGIES: Tape  Family History  Problem Relation Age of Onset  . Hypertension Mother   . Diabetes Mother   . Asthma Mother   . COPD Mother   . Depression Mother   . Miscarriages / [place] Mother   . Vision loss Mother   . Heart disease Mother   . Parkinson's disease Father   . Diabetes Maternal Aunt   . Diabetes Maternal Uncle   . Diabetes Cousin   . Breast cancer Maternal Grandmother   . Leukemia Sister     SH:  Single, non smoker  Review of Systems  Constitutional: Negative.   Gastrointestinal: Negative.   Genitourinary:       Vulvar pain    PHYSICAL EXAMINATION:    BP 117/86   Pulse 76   Ht 5' 6"  (1.676 m)   Wt 207 lb 6.4 oz (94.1 kg)   BMI 33.48 kg/m     General appearance: alert, cooperative and appears stated age Abdomen: soft, non-tender; bowel sounds normal; no masses,  no organomegaly Lymph:  no inguinal LAD noted  Pelvic: External genitalia:  No lesions, no erythema, no drainage noted, right where stitch is present at introitus, skin has pulled apart with no evidence of infection, however this is very tender to the touch just in one very specific location              Urethra:  normal appearing urethra with no masses, tenderness or lesions              Bartholins and Skenes: normal                 Vagina: normal appearing vagina with normal color and discharge, no lesions, sutures still present, whitish discharge present              Cervix: absent              Bimanual Exam:  Uterus:  uterus absent  Chaperone, Kim, RN, was present for exam.  Assessment/Plan: 1. Vaginal discharge - Cervicovaginal ancillary only( [place])   2. Post pt pain with small area of skin disruption right where pain is  located - topical lidocaine encouraged - will start topical estrogen cream to help with healing - sitting with pillow encouraged.   - has follow up early next week.  Encouraged her to keep appt.

## 2020-07-14 NOTE — Telephone Encounter (Signed)
Cancelled appointment for 07/15/2020 per Dr. Sabra Heck. tbw

## 2020-07-15 ENCOUNTER — Encounter: Payer: Self-pay | Admitting: Obstetrics & Gynecology

## 2020-07-15 ENCOUNTER — Encounter (HOSPITAL_BASED_OUTPATIENT_CLINIC_OR_DEPARTMENT_OTHER): Payer: Medicaid Other | Admitting: Obstetrics & Gynecology

## 2020-07-15 ENCOUNTER — Telehealth (HOSPITAL_BASED_OUTPATIENT_CLINIC_OR_DEPARTMENT_OTHER): Payer: Self-pay | Admitting: *Deleted

## 2020-07-15 DIAGNOSIS — R5383 Other fatigue: Secondary | ICD-10-CM | POA: Diagnosis not present

## 2020-07-15 DIAGNOSIS — N926 Irregular menstruation, unspecified: Secondary | ICD-10-CM | POA: Diagnosis not present

## 2020-07-15 DIAGNOSIS — Z114 Encounter for screening for human immunodeficiency virus [HIV]: Secondary | ICD-10-CM | POA: Diagnosis not present

## 2020-07-15 DIAGNOSIS — R0602 Shortness of breath: Secondary | ICD-10-CM | POA: Diagnosis not present

## 2020-07-15 DIAGNOSIS — M25512 Pain in left shoulder: Secondary | ICD-10-CM | POA: Diagnosis not present

## 2020-07-15 DIAGNOSIS — E559 Vitamin D deficiency, unspecified: Secondary | ICD-10-CM | POA: Diagnosis not present

## 2020-07-15 DIAGNOSIS — E7800 Pure hypercholesterolemia, unspecified: Secondary | ICD-10-CM | POA: Diagnosis not present

## 2020-07-15 DIAGNOSIS — R7303 Prediabetes: Secondary | ICD-10-CM | POA: Diagnosis not present

## 2020-07-15 DIAGNOSIS — Z0000 Encounter for general adult medical examination without abnormal findings: Secondary | ICD-10-CM | POA: Diagnosis not present

## 2020-07-15 DIAGNOSIS — M25511 Pain in right shoulder: Secondary | ICD-10-CM | POA: Diagnosis not present

## 2020-07-15 DIAGNOSIS — Z1159 Encounter for screening for other viral diseases: Secondary | ICD-10-CM | POA: Diagnosis not present

## 2020-07-15 DIAGNOSIS — Z7251 High risk heterosexual behavior: Secondary | ICD-10-CM | POA: Diagnosis not present

## 2020-07-15 DIAGNOSIS — F17210 Nicotine dependence, cigarettes, uncomplicated: Secondary | ICD-10-CM | POA: Diagnosis not present

## 2020-07-15 DIAGNOSIS — Z6834 Body mass index (BMI) 34.0-34.9, adult: Secondary | ICD-10-CM | POA: Diagnosis not present

## 2020-07-15 MED ORDER — ESTRADIOL 0.1 MG/GM VA CREA: 1.0000 | TOPICAL_CREAM | Freq: Two times a day (BID) | VAGINAL | Status: DC

## 2020-07-15 NOTE — Telephone Encounter (Signed)
Called pt to see how she is feeling after her visit with Dr. Sabra Heck yesterday. Pt states that she is feeling better and feels that the lidocaine cream is helping. Advised that we could also send in prescription for estrogen cream which can help with the healing process if she wanted. Pt states that she would like for that to be sent in. Advised that she could also try sitting with a pillow to help with discomfort. Pt states that she is using Tylenol and Ibuprofen around the clock to help with the pain. Advised pt to call us back if she has no improvement in pain. Pt verbalized understanding.

## 2020-07-15 NOTE — Telephone Encounter (Signed)
-----   Message from Megan Salon, MD sent at 07/15/2020  6:51 AM EDT ----- Regarding: post op pt Emily Saunders This is the post op pt I saw yesterday.  She has a small area of skin right at the vaginal introitus that has pulled apart (probably from when she was sneezing and coughing).  I had her start putting lidocaine on it.  There doesn't look like there is any infection.  Also, she looked very good and was just sitting on the table comfortably as well as walked without any issues.  I really do not want to give her any more pain medication so I did not.    I thought of two other things she can use.  Some topical estrogen cream would likely also help with healing.  She is a smoker so this makes healing a little slower.  She can just apply it two or three times daily, topically.  Also, sitting with a pillow will probably help.  Can you call her and see if she would like estrogen cream.  If so, please send rx for estrace cream to pharmacy.  Directions would be apply small amount topically 2-3 times daily for 2-3 weeks.  Thanks.  Vinnie Level

## 2020-07-16 LAB — CERVICOVAGINAL ANCILLARY ONLY
Bacterial Vaginitis (gardnerella): NEGATIVE
Comment: NEGATIVE

## 2020-07-20 ENCOUNTER — Other Ambulatory Visit: Payer: Self-pay

## 2020-07-20 ENCOUNTER — Encounter (HOSPITAL_BASED_OUTPATIENT_CLINIC_OR_DEPARTMENT_OTHER): Payer: Self-pay | Admitting: Obstetrics & Gynecology

## 2020-07-20 ENCOUNTER — Ambulatory Visit (INDEPENDENT_AMBULATORY_CARE_PROVIDER_SITE_OTHER): Payer: Medicaid Other | Admitting: Obstetrics & Gynecology

## 2020-07-20 VITALS — BP 121/83 | HR 76 | Wt 205.0 lb

## 2020-07-20 DIAGNOSIS — Z9889 Other specified postprocedural states: Secondary | ICD-10-CM

## 2020-07-20 NOTE — Progress Notes (Signed)
GYNECOLOGY  VISIT  CC:   post op recheck  HPI: 45 y.o. [id number] Single Black or African American female here for recheck after having superficial perineal tissue opening after having episode of coughing.  Testing for BV and yeast were negative.  Still having some mild discharge.  Pain from last week has resolved.  Denies bladder or bowel issues.  Having normal bowel movements.  Has questions about exercise.  Advised no weight lifting for 12 full weeks post op.    MEDS:   Current Outpatient Medications on File Prior to Visit  Medication Sig Dispense Refill   acetaminophen (TYLENOL) 500 MG tablet Take 1 tablet (500 mg total) by mouth every 6 (six) hours as needed (pain). 30 tablet 0   amLODipine (NORVASC) 10 MG tablet Take 1 tablet (10 mg total) by mouth daily. 30 tablet 11   Blood Pressure Monitoring (BLOOD PRESSURE KIT) DEVI 1 mL by Does not apply route 3 (three) times daily. 1 Bag 0   cetirizine (ZYRTEC) 10 MG tablet Take 10 mg by mouth at bedtime.     hydrochlorothiazide (HYDRODIURIL) 25 MG tablet Take 1 tablet (25 mg total) by mouth daily. 30 tablet 11   lidocaine (XYLOCAINE) 5 % ointment Apply 1 application topically 3 (three) times daily. 30 g 0   metroNIDAZOLE (FLAGYL) 500 MG tablet Take 1 tablet (500 mg total) by mouth 2 (two) times daily. (Patient not taking: Reported on 07/14/2020) 14 tablet 0   montelukast (SINGULAIR) 10 MG tablet Take 10 mg by mouth daily.     omeprazole (PRILOSEC) 40 MG capsule Take 40 mg by mouth daily.     OZEMPIC, 0.25 OR 0.5 MG/DOSE, 2 MG/1.5ML SOPN TUESDAY     polyethylene glycol powder (GLYCOLAX/MIRALAX) 17 GM/SCOOP powder Take 17 g by mouth daily. Drink 17g (1 scoop) dissolved in water per day. 17 g 0   Prenat-Fe Poly-Methfol-FA-DHA (VITAFOL ULTRA) 29-0.6-0.4-200 MG CAPS TAKE 1 CAPSULE BY MOUTH DAILY BEFORE BREAKFAST (Patient not taking: Reported on 07/14/2020) 30 capsule 11   Vitamin D, Ergocalciferol, (DRISDOL) 1.25 MG (50000 UNIT) CAPS capsule Take 1 capsule  by mouth once a week. TUESDAYS     Current Facility-Administered Medications on File Prior to Visit  Medication Dose Route Frequency Provider Last Rate Last Admin   estradiol (ESTRACE) vaginal cream 1 Applicatorful  1 Applicatorful Vaginal BID Miller, Mary S, MD        SH:  Smoking No    PHYSICAL EXAMINATION:    BP 121/83   Pulse 76   Wt 205 lb (93 kg)   BMI 33.09 kg/m     General appearance: alert, cooperative and appears stated age  Pelvic: External genitalia:  no lesions, area of superficial skin dehiscence is healing              Urethra:  normal appearing urethra with no masses, tenderness or lesions              Bartholins and Skenes: normal                 Vagina: normal appearing vagina with normal color and discharge, no lesions               Assessment/Plan: 10 Post op states.  Sutures still present.  Recheck 2- 3 weeks.  Pelvic rest also recommended.    

## 2020-07-21 DIAGNOSIS — Z1211 Encounter for screening for malignant neoplasm of colon: Secondary | ICD-10-CM | POA: Diagnosis not present

## 2020-07-21 DIAGNOSIS — R0989 Other specified symptoms and signs involving the circulatory and respiratory systems: Secondary | ICD-10-CM | POA: Diagnosis not present

## 2020-07-24 ENCOUNTER — Other Ambulatory Visit: Payer: Self-pay | Admitting: Family Medicine

## 2020-07-24 DIAGNOSIS — Z1231 Encounter for screening mammogram for malignant neoplasm of breast: Secondary | ICD-10-CM

## 2020-07-29 ENCOUNTER — Ambulatory Visit
Admission: RE | Admit: 2020-07-29 | Discharge: 2020-07-29 | Disposition: A | Discharge status: Home / Self Care | Payer: Medicaid Other | Source: Ambulatory Visit | Attending: Family Medicine | Admitting: Family Medicine

## 2020-07-29 ENCOUNTER — Other Ambulatory Visit: Payer: Self-pay

## 2020-07-29 DIAGNOSIS — Z1231 Encounter for screening mammogram for malignant neoplasm of breast: Secondary | ICD-10-CM

## 2020-08-03 DIAGNOSIS — Z1211 Encounter for screening for malignant neoplasm of colon: Secondary | ICD-10-CM | POA: Diagnosis not present

## 2020-08-03 DIAGNOSIS — Z01818 Encounter for other preprocedural examination: Secondary | ICD-10-CM | POA: Diagnosis not present

## 2020-08-03 DIAGNOSIS — K635 Polyp of colon: Secondary | ICD-10-CM | POA: Diagnosis not present

## 2020-08-04 DIAGNOSIS — M25512 Pain in left shoulder: Secondary | ICD-10-CM | POA: Diagnosis not present

## 2020-08-04 DIAGNOSIS — M25511 Pain in right shoulder: Secondary | ICD-10-CM | POA: Diagnosis not present

## 2020-08-05 ENCOUNTER — Telehealth: Payer: Self-pay | Admitting: Obstetrics and Gynecology

## 2020-08-06 DIAGNOSIS — K635 Polyp of colon: Secondary | ICD-10-CM | POA: Diagnosis not present

## 2020-08-11 ENCOUNTER — Ambulatory Visit (INDEPENDENT_AMBULATORY_CARE_PROVIDER_SITE_OTHER): Payer: Medicaid Other | Admitting: Obstetrics & Gynecology

## 2020-08-11 ENCOUNTER — Other Ambulatory Visit: Payer: Self-pay

## 2020-08-11 ENCOUNTER — Encounter (HOSPITAL_BASED_OUTPATIENT_CLINIC_OR_DEPARTMENT_OTHER): Payer: Self-pay | Admitting: Obstetrics & Gynecology

## 2020-08-11 VITALS — BP 143/93 | HR 70 | Ht 66.0 in | Wt 210.6 lb

## 2020-08-11 DIAGNOSIS — Z9889 Other specified postprocedural states: Secondary | ICD-10-CM

## 2020-08-11 DIAGNOSIS — N62 Hypertrophy of breast: Secondary | ICD-10-CM | POA: Diagnosis not present

## 2020-08-11 DIAGNOSIS — F17210 Nicotine dependence, cigarettes, uncomplicated: Secondary | ICD-10-CM

## 2020-08-11 DIAGNOSIS — M25512 Pain in left shoulder: Secondary | ICD-10-CM

## 2020-08-11 NOTE — Progress Notes (Signed)
GYNECOLOGY  VISIT  CC:   post op recheck  HPI: 45 y.o. [id number] Single Black or African American female here for recheck after undergoing total vaginal hysterectomy, bilateral salpingectomy, uterosacral ligament suspension, anterior and posterior repair with perineorrhaphy, cystoscopy on 06/08/2020.  She reports bleeding is none.  She has no pain.  Bowel function is Normal.  Bladder function is normal.  Pt is planning on returning to work in August.  Feels she will be ready.  Having some shoulder issues.  Is being seen by Dr. Dorothea Ogle at [place].  She is on prednisone.  She started this last week.  Reports she's been having intermittent sharp pains in her left should.  Sometimes this is with movement and sometimes with driving and sometimes sitting still.  Sometime hurts with lifts and sometimes it doesn't.  Reports last week she had a lot of left shoulder pain with trying to take off a deodorant bottle top.  She can't put her bra on unless does the clasp in the front.  This has been going on for a few weeks.  Has taken ibuprofen and this doesn't help.  Pt questions is this is related to her breasts.  Wears 38 J bra size.  Even with this size, she feels the cups do not always hold her.  Would like consultation.  Asks if related to shoulder pain.  I don't feel it is because pain is of recent occurrence.    MEDS:   Current Outpatient Medications on File Prior to Visit  Medication Sig Dispense Refill   acetaminophen (TYLENOL) 500 MG tablet Take 1 tablet (500 mg total) by mouth every 6 (six) hours as needed (pain). 30 tablet 0   amLODipine (NORVASC) 10 MG tablet Take 1 tablet (10 mg total) by mouth daily. 30 tablet 11   Blood Pressure Monitoring (BLOOD PRESSURE KIT) DEVI 1 mL by Does not apply route 3 (three) times daily. 1 Bag 0   cetirizine (ZYRTEC) 10 MG tablet Take 10 mg by mouth at bedtime.     hydrochlorothiazide (HYDRODIURIL) 25 MG tablet Take 1 tablet (25 mg total) by mouth daily. 30 tablet 11    lidocaine (XYLOCAINE) 5 % ointment Apply 1 application topically 3 (three) times daily. 30 g 0   montelukast (SINGULAIR) 10 MG tablet Take 10 mg by mouth daily.     omeprazole (PRILOSEC) 40 MG capsule Take 40 mg by mouth daily.     OZEMPIC, 0.25 OR 0.5 MG/DOSE, 2 MG/1.5ML SOPN TUESDAY     polyethylene glycol powder (GLYCOLAX/MIRALAX) 17 GM/SCOOP powder Take 17 g by mouth daily. Drink 17g (1 scoop) dissolved in water per day. 17 g 0   Vitamin D, Ergocalciferol, (DRISDOL) 1.25 MG (50000 UNIT) CAPS capsule Take 1 capsule by mouth once a week. TUESDAYS     Current Facility-Administered Medications on File Prior to Visit  Medication Dose Route Frequency Provider Last Rate Last Admin   estradiol (ESTRACE) vaginal cream 1 Applicatorful  1 Applicatorful Vaginal BID Megan Salon, MD        SH:  Smoking No    PHYSICAL EXAMINATION:    BP (!) 143/93 (BP Location: Left Arm, Patient Position: Sitting, Cuff Size: Large)   Pulse 70   Ht 5' 6" (1.676 m) Comment: reported  Wt 210 lb 9.6 oz (95.5 kg)   LMP 06/01/2020   BMI 33.99 kg/m     General appearance: alert, cooperative and appears stated age  Pelvic: External genitalia:  no lesions  Urethra:  normal appearing urethra with no masses, tenderness or lesions              Bartholins and Skenes: normal                 Vagina: normal appearing vagina with normal color and discharge, no lesions              Cervix: absent              Bimanual Exam:  Uterus:  uterus absent              Adnexa: normal adnexa  Chaperone, Portia Eaton, CMA, was present for exam.  Assessment/Plan: 1. Post-operative state - has follow up appt with Dr. Schroeder.  Released for SA.  2. Acute pain of left shoulder - AMB referral to sports medicine  3. Vaping with nicotine  4. Breast hypertrophy in female - Ambulatory referral to Plastic Surgery      

## 2020-08-14 ENCOUNTER — Ambulatory Visit: Payer: Medicaid Other | Admitting: Family Medicine

## 2020-08-14 DIAGNOSIS — R7303 Prediabetes: Secondary | ICD-10-CM | POA: Diagnosis not present

## 2020-08-14 DIAGNOSIS — R635 Abnormal weight gain: Secondary | ICD-10-CM | POA: Diagnosis not present

## 2020-08-14 DIAGNOSIS — I10 Essential (primary) hypertension: Secondary | ICD-10-CM | POA: Diagnosis not present

## 2020-08-14 DIAGNOSIS — F17210 Nicotine dependence, cigarettes, uncomplicated: Secondary | ICD-10-CM | POA: Diagnosis not present

## 2020-08-14 DIAGNOSIS — E559 Vitamin D deficiency, unspecified: Secondary | ICD-10-CM | POA: Diagnosis not present

## 2020-08-14 DIAGNOSIS — Z6834 Body mass index (BMI) 34.0-34.9, adult: Secondary | ICD-10-CM | POA: Diagnosis not present

## 2020-08-14 DIAGNOSIS — R632 Polyphagia: Secondary | ICD-10-CM | POA: Diagnosis not present

## 2020-08-14 DIAGNOSIS — N926 Irregular menstruation, unspecified: Secondary | ICD-10-CM | POA: Diagnosis not present

## 2020-08-17 NOTE — Progress Notes (Signed)
Subjective:    I'm seeing this patient as a consultation for:  Dr. Sabra Heck. Note will be routed back to referring provider/PCP.  CC: L shoulder pain  I, Emily Saunders, LAT, ATC, am serving as scribe for Dr. Lynne Saunders.  HPI: Pt is a 45 y/o RHD female presenting w/ c/o L shoulder pain x approximately one month w/ no known MOI.  She locates her pain to her L ant shoulder.  She works as a Teacher, early years/pre.  Radiating pain: yes into her L upper arm L shoulder mechanical symptoms: No Aggravating factors: functional IR I.e. donning her bra; driving w/ her L hand on top of steering wheel Treatments tried: IBU; prednisone dose pack; hydrocoodne; IcyHot  Past medical history, Surgical history, Family history, Social history, Allergies, and medications have been entered into the medical record, reviewed.   Review of Systems: No new headache, visual changes, nausea, vomiting, diarrhea, constipation, dizziness, abdominal pain, skin rash, fevers, chills, night sweats, weight loss, swollen lymph nodes, body aches, joint swelling, muscle aches, chest pain, shortness of breath, mood changes, visual or auditory hallucinations.   Objective:    Vitals:   08/19/20 0934  BP: 120/80  Pulse: 78  SpO2: 98%   General: Well Developed, well nourished, and in no acute distress.  Neuro/Psych: Alert and oriented x3, extra-ocular muscles intact, able to move all 4 extremities, sensation grossly intact. Skin: Warm and dry, no rashes noted.  Respiratory: Not using accessory muscles, speaking in full sentences, trachea midline.  Cardiovascular: Pulses palpable, no extremity edema. Abdomen: Does not appear distended. MSK: Left shoulder normal-appearing Nontender. Normal motion. Intact strength. Minimally positive Hawkins and Neer's test.  Negative Yergason's and speeds test.  C-spine normal.  Nontender normal cervical motion.  Upper extremity strength reflexes and sensation are intact.  Lab and Radiology  Results  Procedure: Real-time Ultrasound Guided Injection of left shoulder subacromial bursa Device: Philips Affiniti 50G Images permanently stored and available for review in PACS [place] evaluation prior to injection reveals intact rotator cuff tendons and biceps tendon.  Moderate subacromial bursitis present. Verbal informed consent obtained.  Discussed risks and benefits of procedure. Warned about infection bleeding damage to structures skin hypopigmentation and fat atrophy among others. Patient expresses understanding and agreement Time-out conducted.   Noted no overlying erythema, induration, or other signs of local infection.   Skin prepped in a sterile fashion.   Local anesthesia: Topical Ethyl chloride.   With sterile technique and under real time ultrasound guidance: 40 mg of Kenalog and 2 ml of Marcaine injected into subacromial bursa. Fluid seen entering the bursa.   Completed without difficulty   Pain immediately resolved suggesting accurate placement of the medication.   Advised to call if fevers/chills, erythema, induration, drainage, or persistent bleeding.   Images permanently stored and available for review in the ultrasound unit.  Impression: Technically successful ultrasound guided injection.      Impression and Recommendations:    Assessment and Plan: 45 y.o. female with left shoulder discomfort and pain.  Difficult to fully characterize.  She is not very painful and her exam is largely negative.  Ultrasound evaluation does show moderate subacromial bursitis which is probably the main source of pain today.  Plan for injection, and referral to PT.  Recheck back in about 6 weeks.Marland Kitchen  PDMP not reviewed this encounter. Orders Placed This Encounter  Procedures   [place] LIMITED JOINT SPACE STRUCTURES UP LEFT(NO LINKED CHARGES)    Order Specific Question:   Reason for  Exam (SYMPTOM  OR DIAGNOSIS REQUIRED)    Answer:   lt shoulder    Order Specific Question:   Preferred imaging  location?    Answer:   [place]   Ambulatory referral to Physical Therapy    Referral Priority:   Routine    Referral Type:   Physical Medicine    Referral Reason:   Specialty Services Required    Requested Specialty:   Physical Therapy   No orders of the defined types were placed in this encounter.   Discussed warning signs or symptoms. Please see discharge instructions. Patient expresses understanding.   The above documentation has been reviewed and is accurate and complete Emily Saunders, M.D.

## 2020-08-18 DIAGNOSIS — J310 Chronic rhinitis: Secondary | ICD-10-CM | POA: Diagnosis not present

## 2020-08-18 DIAGNOSIS — K219 Gastro-esophageal reflux disease without esophagitis: Secondary | ICD-10-CM | POA: Diagnosis not present

## 2020-08-19 ENCOUNTER — Other Ambulatory Visit: Payer: Self-pay

## 2020-08-19 ENCOUNTER — Ambulatory Visit: Payer: Medicaid Other | Admitting: Family Medicine

## 2020-08-19 ENCOUNTER — Encounter: Payer: Self-pay | Admitting: Family Medicine

## 2020-08-19 ENCOUNTER — Ambulatory Visit: Payer: Self-pay

## 2020-08-19 VITALS — BP 120/80 | HR 78 | Ht 66.0 in | Wt 201.6 lb

## 2020-08-19 DIAGNOSIS — M25512 Pain in left shoulder: Secondary | ICD-10-CM

## 2020-08-19 NOTE — Patient Instructions (Signed)
Thank you for coming in today.  Call or go to the ER if you develop a large red swollen joint with extreme pain or oozing puss.   I've referred you to Physical Therapy.  Let us know if you don't hear from them in one week.  Recheck in 6weeks.   :Let me know if this is not working.    

## 2020-08-23 NOTE — Progress Notes (Signed)
[facility]  Date of Visit: 08/24/2020  History of Present Illness: Emily Saunders is a 45 y.o. female scheduled today for a post-operative visit.   Surgery: s/p total vaginal hysterectomy, bilateral salpingectomy, uterosacral ligament suspension, anterior and posterior repair with perineorrhaphy, cystoscopy on 06/08/2020.  She passed her postoperative void trial.   Postoperative course has been uncomplicated. Started on vaginal estrogen shortly post op due to tenderness at stitch at introitus. Dr Sabra Heck saw her for a post-op visit at 6 weeks.  Today she reports she has been doing well- no complaints.   UTI in the last 6 weeks? No  Pain? No  She has not returned to exercise. Vaginal bulge? No  Stress incontinence: No  Urgency/frequency: No  Urge incontinence: No  Voiding dysfunction: No  Bowel issues: No   Subjective Success: Do you usually have a bulge or something falling out that you can see or feel in the vaginal area? No  Retreatment Success: Any retreatment with surgery or pessary for any compartment? No   Pathology results: UTERUS, CERVIX, BILATERAL FALLOPIAN TUBES:  Uterus:  -  Secretory pattern endometrium  -  Benign endometrial polyp (0.7 cm)  -  Leiomyoma (0.5 cm)  -  No hyperplasia or malignancy identified   Cervix:  -  Benign cervix with focal squamous metaplasia and nabothian cysts  -  No dysplasia or malignancy identified    Fallopian tubes, bilateral:  -  Benign fallopian tube(s)  -  No malignancy identified   Medications: She has a current medication list which includes the following prescription(s): acetaminophen, amlodipine, blood pressure kit, cetirizine, hydrochlorothiazide, lidocaine, montelukast, omeprazole, ozempic (0.25 or 0.5 mg/dose), polyethylene glycol powder, and vitamin d (ergocalciferol), and the following Facility-Administered Medications: estradiol.   Allergies: Patient is allergic to tape.   Physical Exam: There were no vitals  taken for this visit.   Pelvic Examination: Vagina: Incisions healing well. Sutures are not present at incision line and there is not granulation tissue. No tenderness along the anterior or posterior vagina. No apical tenderness. No pelvic masses.   Unable to perform kegel.   POP-Q: POP-Q  -3                                            Aa   -3                                           Ba  -9                                              C   2.5                                            Gh  3                                            Pb  9  tvl   -3                                            Ap  -3                                            Bp                                                 D    ---------------------------------------------------------  Assessment and Plan:  1. Pelvic floor dysfunction in female     - Pathology results were reviewed with the patient today and she verbalized understanding that the results were benign.  - Can resume regular activity including exercise and intercourse,  if desired.  - Discussed avoidance of heavy lifting and straining long term to reduce the risk of recurrence.  - She would like to work on pelvic floor muscle strengthening- referral placed to physical therapy.  - Will need to continue annual exams with her regular gynecologist.   All questions answered. Follow up as needed.   Emily Folds, MD  Time spent: I spent 25 minutes dedicated to the care of this patient on the date of this encounter to include pre-visit review of records, face-to-face time with the patient and post visit documentation and ordering medication/ testing.

## 2020-08-24 ENCOUNTER — Ambulatory Visit (INDEPENDENT_AMBULATORY_CARE_PROVIDER_SITE_OTHER): Payer: Medicaid Other | Admitting: Obstetrics and Gynecology

## 2020-08-24 ENCOUNTER — Encounter: Payer: Self-pay | Admitting: Obstetrics and Gynecology

## 2020-08-24 ENCOUNTER — Other Ambulatory Visit: Payer: Self-pay

## 2020-08-24 DIAGNOSIS — M6289 Other specified disorders of muscle: Secondary | ICD-10-CM

## 2020-08-24 NOTE — Telephone Encounter (Signed)
Left message with [facility] billing but never received a call back.

## 2020-08-25 DIAGNOSIS — J342 Deviated nasal septum: Secondary | ICD-10-CM | POA: Diagnosis not present

## 2020-08-25 DIAGNOSIS — Z9889 Other specified postprocedural states: Secondary | ICD-10-CM | POA: Diagnosis not present

## 2020-08-25 DIAGNOSIS — R220 Localized swelling, mass and lump, head: Secondary | ICD-10-CM | POA: Diagnosis not present

## 2020-08-25 DIAGNOSIS — K219 Gastro-esophageal reflux disease without esophagitis: Secondary | ICD-10-CM | POA: Diagnosis not present

## 2020-08-25 DIAGNOSIS — J329 Chronic sinusitis, unspecified: Secondary | ICD-10-CM | POA: Diagnosis not present

## 2020-08-25 DIAGNOSIS — J310 Chronic rhinitis: Secondary | ICD-10-CM | POA: Diagnosis not present

## 2020-08-26 ENCOUNTER — Encounter: Payer: Self-pay | Admitting: Obstetrics and Gynecology

## 2020-09-03 ENCOUNTER — Encounter: Payer: Self-pay | Admitting: Obstetrics and Gynecology

## 2020-09-03 ENCOUNTER — Other Ambulatory Visit: Payer: Self-pay

## 2020-09-07 DIAGNOSIS — R635 Abnormal weight gain: Secondary | ICD-10-CM | POA: Diagnosis not present

## 2020-09-07 DIAGNOSIS — Z6832 Body mass index (BMI) 32.0-32.9, adult: Secondary | ICD-10-CM | POA: Diagnosis not present

## 2020-09-07 DIAGNOSIS — R632 Polyphagia: Secondary | ICD-10-CM | POA: Diagnosis not present

## 2020-09-07 DIAGNOSIS — F17210 Nicotine dependence, cigarettes, uncomplicated: Secondary | ICD-10-CM | POA: Diagnosis not present

## 2020-09-07 DIAGNOSIS — I10 Essential (primary) hypertension: Secondary | ICD-10-CM | POA: Diagnosis not present

## 2020-09-07 DIAGNOSIS — R7303 Prediabetes: Secondary | ICD-10-CM | POA: Diagnosis not present

## 2020-09-07 DIAGNOSIS — N926 Irregular menstruation, unspecified: Secondary | ICD-10-CM | POA: Diagnosis not present

## 2020-09-12 ENCOUNTER — Encounter (HOSPITAL_COMMUNITY): Payer: Self-pay | Admitting: Emergency Medicine

## 2020-09-12 ENCOUNTER — Other Ambulatory Visit: Payer: Self-pay

## 2020-09-12 ENCOUNTER — Emergency Department (HOSPITAL_COMMUNITY)
Admission: EM | Admit: 2020-09-12 | Discharge: 2020-09-13 | Disposition: A | Discharge status: Home / Self Care | Payer: Medicaid Other | Attending: Emergency Medicine | Admitting: Emergency Medicine

## 2020-09-12 DIAGNOSIS — Z87891 Personal history of nicotine dependence: Secondary | ICD-10-CM | POA: Insufficient documentation

## 2020-09-12 DIAGNOSIS — L02411 Cutaneous abscess of right axilla: Secondary | ICD-10-CM

## 2020-09-12 DIAGNOSIS — L02413 Cutaneous abscess of right upper limb: Secondary | ICD-10-CM | POA: Diagnosis not present

## 2020-09-12 DIAGNOSIS — I10 Essential (primary) hypertension: Secondary | ICD-10-CM | POA: Diagnosis not present

## 2020-09-12 DIAGNOSIS — Z79899 Other long term (current) drug therapy: Secondary | ICD-10-CM | POA: Diagnosis not present

## 2020-09-12 NOTE — ED Triage Notes (Signed)
Pt reports possible abscess under right arm that appeared 2 weeks ago. Reports the knot is soft, but she can "smell infection" in it. Denies fever, N/V. Found no relief with tylenol.

## 2020-09-12 NOTE — ED Provider Notes (Signed)
Emergency Medicine Provider Triage Evaluation Note  Emily Saunders , a 45 y.o. female  was evaluated in triage.  Pt complains of R axillary abscess x 2 weeks. Progressive worsening, painful.  Review of Systems  Positive: abscess Negative: Fever, vomiting  Physical Exam  BP 118/89 (BP Location: Right Arm)   Pulse 82   Temp 98.9 F (37.2 C) (Oral)   Resp 15   Ht '5\' 6"'$  (1.676 m)   Wt 90.7 kg   SpO2 99%   BMI 32.28 kg/m  Gen:   Awake, no distress   Resp:  Normal effort  Other:  Induration to R axilla with degree or fluctuance. Tender to palpation.   Medical Decision Making  Medically screening exam initiated at 11:02 PM.  Appropriate orders placed.  Emily Saunders was informed that the remainder of the evaluation will be completed by another provider, this initial triage assessment does not replace that evaluation, and the importance of remaining in the ED until their evaluation is complete.     Leafy Kindle 09/12/20 2302    Charlesetta Shanks, MD 10/11/20 1425

## 2020-09-13 MED ORDER — LIDOCAINE-EPINEPHRINE (PF) 2 %-1:200000 IJ SOLN
10.0000 mL | Freq: Once | INTRAMUSCULAR | Status: AC
  Administered 2020-09-13: 10 mL
  Filled 2020-09-13: qty 20

## 2020-09-13 NOTE — Discharge Instructions (Addendum)
Ibuprofen and/or Tylenol for pain as needed. Follow up with your doctor if abscess is not significantly improved in 3-4 days.

## 2020-09-13 NOTE — ED Provider Notes (Signed)
[facility] DEPT Provider Note   CSN: [id number] Arrival date & time: 09/12/20  2207     History Chief Complaint  Patient presents with   Abscess    Emily Saunders is a 45 y.o. female.  Patient to ED With painful swelling in the right axilla x 2 weeks. History of boils requirement I&D. No fever. No drainage.   The history is provided by the patient. No language interpreter was used.  Abscess Associated symptoms: no fever       Past Medical History:  Diagnosis Date   GERD (gastroesophageal reflux disease)    H/O pilonidal cyst 2002    History of hiatal hernia    Hypertension    PID (acute pelvic inflammatory disease) 2006    Pneumonia 2016   Vitamin D deficiency 2015    Wears dentures    FULL SET    Patient Active Problem List   Diagnosis Date Noted   Pain in left knee 09/11/2019   HTN (hypertension) 02/27/2015   Unintended weight loss 10/27/2014   Allergic rhinitis 06/23/2014   Vitamin D insufficiency 03/03/2014   Pilonidal cyst 03/03/2014   Broken teeth 03/03/2014   Poor sleep pattern 03/03/2014   Obesity (BMI 30-39.9) 03/03/2014   Former smoker 03/03/2014   History of anemia 05/27/2013    Past Surgical History:  Procedure Laterality Date   ANTERIOR AND POSTERIOR REPAIR N/A 06/08/2020   Procedure: ANTERIOR (CYSTOCELE) AND POSTERIOR REPAIR (RECTOCELE) with perineorrhaphy;  Surgeon: Jaquita Folds, MD;  Location: [facility];  Service: Gynecology;  Laterality: N/A;   CESAREAN SECTION     X 1   CYST EXCISION Right 2003   hand   CYSTOSCOPY N/A 06/08/2020   Procedure: CYSTOSCOPY;  Surgeon: Jaquita Folds, MD;  [facility];  Service: Gynecology;  Laterality: N/A;   NASAL SINUS SURGERY Bilateral 02/10/2020   Procedure: ENDOSCOPIC SINUS SURGERY Maxillary Anstrostomy with Tissue Removal;  Surgeon: Izora Gala, MD;  Location: [place] [facility];  Service: ENT;  Laterality:  Bilateral;   VAGINAL HYSTERECTOMY N/A 06/08/2020   Procedure: HYSTERECTOMY VAGINAL with bilateral salpingectomy;  Surgeon: Jaquita Folds, MD;  Location: [facility];  Service: Gynecology;  Laterality: N/A;   VAGINAL PROLAPSE REPAIR N/A 06/08/2020   Procedure: UTEROSACRAL SUSPENSION;  Surgeon: Jaquita Folds, MD;  Location: [facility];  Service: Gynecology;  Laterality: N/A;     OB History     Gravida  6   Para  4   Term  4   Preterm      AB  2   Living  4      SAB  1   IAB      Ectopic  1   Multiple  0   Live Births  4           Family History  Problem Relation Age of Onset   Hypertension Mother    Diabetes Mother    Asthma Mother    COPD Mother    Depression Mother    87 / [place] Mother    Vision loss Mother    Heart disease Mother    Parkinson's disease Father    Diabetes Maternal Aunt    Diabetes Maternal Uncle    Diabetes Cousin    Breast cancer Maternal Grandmother    Leukemia Sister     Social History   Tobacco Use   Smoking status: Former  Packs/day: 0.50    Years: 20.00    Pack years: 10.00    Types: Cigarettes   Smokeless tobacco: Never   Tobacco comments:    Does vape with nicotine  Vaping Use   Vaping Use: Never used  Substance Use Topics   Alcohol use: No    Alcohol/week: 0.0 standard drinks   Drug use: No    Home Medications Prior to Admission medications   Medication Sig Start Date End Date Taking? Authorizing Provider  acetaminophen (TYLENOL) 500 MG tablet Take 1 tablet (500 mg total) by mouth every 6 (six) hours as needed (pain). 06/04/20   Jaquita Folds, MD  amLODipine (NORVASC) 10 MG tablet Take 1 tablet (10 mg total) by mouth daily. 02/22/19   Shelly Bombard, MD  Blood Pressure Monitoring (BLOOD PRESSURE KIT) DEVI 1 mL by Does not apply route 3 (three) times daily. 11/30/18   [name], NP  cetirizine (ZYRTEC) 10 MG tablet Take 10 mg by  mouth at bedtime. 04/18/20   [provider]  hydrochlorothiazide (HYDRODIURIL) 25 MG tablet Take 1 tablet (25 mg total) by mouth daily. 02/22/19   Shelly Bombard, MD  lidocaine (XYLOCAINE) 5 % ointment Apply 1 application topically 3 (three) times daily. 06/22/20   Megan Salon, MD  montelukast (SINGULAIR) 10 MG tablet Take 10 mg by mouth daily.    [provider]  omeprazole (PRILOSEC) 40 MG capsule Take 40 mg by mouth daily. 01/07/20   [provider]  OZEMPIC, 0.25 OR 0.5 MG/DOSE, 2 MG/1.5ML SOPN TUESDAY 05/03/20   [provider]  polyethylene glycol powder (GLYCOLAX/MIRALAX) 17 GM/SCOOP powder Take 17 g by mouth daily. Drink 17g (1 scoop) dissolved in water per day. 06/04/20   Jaquita Folds, MD  Vitamin D, Ergocalciferol, (DRISDOL) 1.25 MG (50000 UNIT) CAPS capsule Take 1 capsule by mouth once a week. TUESDAYS 04/24/20   [provider]    Allergies    Tape  Review of Systems   Review of Systems  Constitutional:  Negative for fever.  Musculoskeletal:        See HPI.  Skin:  Positive for wound.   Physical Exam Updated Vital Signs BP 104/83   Pulse 64   Temp 97.9 F (36.6 C) (Oral)   Resp 16   Ht _0  (1.676 m)   Wt 90.7 kg   SpO2 98%   BMI 32.28 kg/m   Physical Exam Vitals and nursing note reviewed.  Constitutional:      General: She is not in acute distress.    Appearance: Normal appearance.  Pulmonary:     Effort: Pulmonary effort is normal.  Musculoskeletal:        General: Normal range of motion.  Skin:    Comments: Indurated and swollen area to right axilla c/w abscess. No significant erythema or cellulitic changes.   Neurological:     General: No focal deficit present.     Mental Status: She is alert and oriented to person, place, and time.    ED Results / Procedures / Treatments   Labs (all labs ordered are listed, but only abnormal results are displayed) Labs Reviewed - No data to  display  EKG None  Radiology No results found.  Procedures .Marland KitchenIncision and Drainage  Date/Time: 09/13/2020 5:21 AM Performed by: Charlann Lange, PA-C Authorized by: Charlann Lange, PA-C   Consent:    Consent obtained:  Verbal   Consent given by:  Patient Universal protocol:  Procedure explained and questions answered to patient or proxy's satisfaction: yes     Immediately prior to procedure, a time out was called: yes     Patient identity confirmed:  Verbally with patient Location:    Type:  Abscess   Location:  Upper extremity   Upper extremity location:  Arm   Arm location:  R upper arm Pre-procedure details:    Skin preparation:  Povidone-iodine Anesthesia:    Anesthesia method:  Local infiltration   Local anesthetic:  Lidocaine 2% WITH epi Procedure type:    Complexity:  Simple Procedure details:    Incision types:  Single straight   Incision depth:  Subcutaneous   Wound management:  Probed and deloculated and irrigated with saline   Drainage:  Bloody and purulent   Drainage amount:  Moderate   Packing materials:  None Post-procedure details:    Procedure completion:  Tolerated well, no immediate complications   Medications Ordered in ED Medications - No data to display  ED Course  I have reviewed the triage vital signs and the nursing notes.  Pertinent labs & imaging results that were available during my care of the patient were reviewed by me and considered in my medical decision making (see chart for details).    MDM Rules/Calculators/A&P                           Patient to ED with Simple right axillary abscess, I&D'd as per above note.   Final Clinical Impression(s) / ED Diagnoses Final diagnoses:  None   Right axillary abscess   Rx / DC Orders ED Discharge Orders     None        Charlann Lange, PA-C 09/13/20 0603    Orpah Greek, MD 09/13/20 [phone]

## 2020-09-14 ENCOUNTER — Telehealth: Payer: Self-pay

## 2020-09-14 NOTE — Telephone Encounter (Signed)
Transition Care Management Follow-up Telephone Call Date of discharge and from where: 09/13/2020-[facility] [place] How have you been since you were released from the hospital? Patient stated she is doing ok.  Any questions or concerns? No  Items Reviewed: Did the pt receive and understand the discharge instructions provided? Yes  Medications obtained and verified?  No  medications given at discharge  Other? No  Any new allergies since your discharge? No  Dietary orders reviewed? N/A Do you have support at home? Yes   Home Care and Equipment/Supplies: Were home health services ordered? not applicable If so, what is the name of the agency? N/A  Has the agency set up a time to come to the patient's home? not applicable Were any new equipment or medical supplies ordered?  No What is the name of the medical supply agency? N/A Were you able to get the supplies/equipment? not applicable Do you have any questions related to the use of the equipment or supplies? No  Functional Questionnaire: (I = Independent and D = Dependent) ADLs: I  Bathing/Dressing- I  Meal Prep- I  Eating- I  Maintaining continence- I  Transferring/Ambulation- I  Managing Meds- I  Follow up appointments reviewed:  PCP Hospital f/u appt confirmed? No   Specialist Hospital f/u appt confirmed? No   Are transportation arrangements needed? No  If their condition worsens, is the pt aware to call PCP or go to the Emergency Dept.? Yes Was the patient provided with contact information for the PCP's office or ED? Yes Was to pt encouraged to call back with questions or concerns? Yes

## 2020-09-15 ENCOUNTER — Encounter: Payer: Self-pay | Admitting: Physical Therapy

## 2020-09-15 ENCOUNTER — Other Ambulatory Visit: Payer: Self-pay

## 2020-09-15 ENCOUNTER — Encounter: Payer: Medicaid Other | Attending: Obstetrics and Gynecology | Admitting: Physical Therapy

## 2020-09-15 DIAGNOSIS — R0989 Other specified symptoms and signs involving the circulatory and respiratory systems: Secondary | ICD-10-CM | POA: Diagnosis not present

## 2020-09-15 DIAGNOSIS — M6281 Muscle weakness (generalized): Secondary | ICD-10-CM | POA: Diagnosis not present

## 2020-09-15 DIAGNOSIS — R278 Other lack of coordination: Secondary | ICD-10-CM | POA: Insufficient documentation

## 2020-09-15 DIAGNOSIS — Z86010 Personal history of colonic polyps: Secondary | ICD-10-CM | POA: Diagnosis not present

## 2020-09-15 NOTE — Patient Instructions (Signed)
Access Code: [email or web address] URL: [email or web address] Date: 09/15/2020 Prepared by: Earlie Counts  Exercises Hooklying Isometric Hip Flexion - 1 x daily - 7 x weekly - 1 sets - 10 reps - 5 sec hold Supine Hip Adduction Isometric with Ball - 1 x daily - 7 x weekly - 1 sets - 10 reps - 5 sec hold Sidelying Hip Adduction Isometric with Ball - 1 x daily - 7 x weekly - 1 sets - 10 reps - 5 sec hold  Earlie Counts, PT [facility] [address], [place], [place] [address] W: [phone] [email or web address] [email or web address]

## 2020-09-15 NOTE — Therapy (Signed)
[facility] at [facility] [address], [place], [place], [address] Phone: [phone]   Fax:  [phone]  Physical Therapy Evaluation  Patient Details  Name: Emily Saunders MRN: [id number] Date of Birth: [birth year] Referring Provider (PT): Dr. Sherlene Shams   Encounter Date: 09/15/2020   PT End of Session - 09/15/20 1646     Visit Number 1    Date for PT Re-Evaluation 01/15/21    Authorization Type [employer]    PT Start Time 1600    PT Stop Time 1640    PT Time Calculation (min) 40 min    Activity Tolerance Patient tolerated treatment well    Behavior During Therapy [facility] for tasks assessed/performed             Past Medical History:  Diagnosis Date   GERD (gastroesophageal reflux disease)    H/O pilonidal cyst 2002    History of hiatal hernia    Hypertension    PID (acute pelvic inflammatory disease) 2006    Pneumonia 2016   Vitamin D deficiency 2015    Wears dentures    FULL SET    Past Surgical History:  Procedure Laterality Date   ANTERIOR AND POSTERIOR REPAIR N/A 06/08/2020   Procedure: ANTERIOR (CYSTOCELE) AND POSTERIOR REPAIR (RECTOCELE) with perineorrhaphy;  Surgeon: Jaquita Folds, MD;  Location: [facility];  Service: Gynecology;  Laterality: N/A;   CESAREAN SECTION     X 1   CYST EXCISION Right 2003   hand   CYSTOSCOPY N/A 06/08/2020   Procedure: CYSTOSCOPY;  Surgeon: Jaquita Folds, MD;  Location: [facility];  Service: Gynecology;  Laterality: N/A;   NASAL SINUS SURGERY Bilateral 02/10/2020   Procedure: ENDOSCOPIC SINUS SURGERY Maxillary Anstrostomy with Tissue Removal;  Surgeon: Izora Gala, MD;  Location: [place] [facility];  Service: ENT;  Laterality: Bilateral;   VAGINAL HYSTERECTOMY N/A 06/08/2020   Procedure: HYSTERECTOMY VAGINAL with bilateral salpingectomy;  Surgeon: Jaquita Folds, MD;  Location: [facility];  Service:  Gynecology;  Laterality: N/A;   VAGINAL PROLAPSE REPAIR N/A 06/08/2020   Procedure: UTEROSACRAL SUSPENSION;  Surgeon: Jaquita Folds, MD;  Location: [facility];  Service: Gynecology;  Laterality: N/A;    There were no vitals filed for this visit.    Subjective Assessment - 09/15/20 1558     Subjective Patient had an hysterectomy and prolapse repair on 06/2020. MD wants me to get stronger.    Patient Stated Goals get stronger in core to get back to exercise    Currently in Pain? No/denies                [facility] PT Assessment - 09/15/20 0001       Assessment   Medical Diagnosis M62.89 Pelvic floor dysfunction in female    Referring Provider (PT) Dr. Sherlene Shams    Onset Date/Surgical Date 06/08/20    Prior Therapy None      Precautions   Precautions Other (comment)    Precaution Comments Avoid heavy lifting or straining      Restrictions   Weight Bearing Restrictions No      Balance Screen   Has the patient fallen in the past 6 months No    Has the patient had a decrease in activity level because of a fear of falling?  No    Is the patient reluctant to leave their home because of a fear of falling?  No  Home Environment   Living Environment Private residence      Prior Function   Level of Independence Independent    Vocation Requirements drive a school bus    Leisure prior to surgery was lifting weights, go to the gym 2-3 times per week      Cognition   Overall Cognitive Status Within Functional Limits for tasks assessed      Posture/Postural Control   Posture/Postural Control No significant limitations      ROM / Strength   AROM / PROM / Strength AROM;PROM;Strength      AROM   Overall AROM Comments full lumbar ROM      PROM   Right Hip External Rotation  55    Left Hip External Rotation  36      Strength   Right Hip Extension 5/5    Right Hip ABduction 4+/5    Right Hip ADduction 4/5    Left Hip Extension 4+/5    Left  Hip ABduction 4+/5    Left Hip ADduction 3+/5                        Objective measurements completed on examination: See above findings.     Pelvic Floor Special Questions - 09/15/20 0001     Prior Pregnancies Yes    Number of Pregnancies 6   1 tubal and miscarriage   Number of C-Sections 1    Number of Vaginal Deliveries 3    Diastasis Recti 1 finger width above and below umbilicus    Currently Sexually Active No    Urinary Leakage No    Fecal incontinence No   goes to GI and uses Linzess, BM every 3 days   Caffeine beverages increased 2 soda and coffee    Skin Integrity Intact;Other    Skin Integrity other dryness    Prolapse None    Pelvic Floor Internal Exam Patient confirms identification and approves PT to assess pelvic floor and treatment    Exam Type Vaginal    Palpation tighter on the left introitus than the right    Strength Flicker              [facility] Adult PT Treatment/Exercise - 09/15/20 0001       Lumbar Exercises: Supine   Ab Set 10 reps;5 seconds    AB Set Limitations with ball squeeze, therapist using her finger int he vaginal canal to feel the pelvic floor contraction    Isometric Hip Flexion 5 reps;5 seconds   each leg   Isometric Hip Flexion Limitations with therapist finger in the vaginal canal to palapate the contraction      Lumbar Exercises: Sidelying   Other Sidelying Lumbar Exercises sidely ball squeeze with pelvic floro contraction holding for 5 seonds 5 times                    PT Education - 09/15/20 1638     Education Details Access Code: [email or web address]    Person(s) Educated Patient    Methods Explanation;Demonstration;Verbal cues;Handout    Comprehension Returned demonstration;Verbalized understanding              PT Short Term Goals - 09/15/20 1653       PT SHORT TERM GOAL #1   Title independent with initial HEP    Baseline not educated yet    Time 4    Period Weeks    Status New    Target  Date  10/13/20               PT Long Term Goals - 09/15/20 1653       PT LONG TERM GOAL #1   Title independent with core and pelvic floor strengthening    Baseline not educated yet    Time 4    Period Months    Status New    Target Date 01/15/21      PT LONG TERM GOAL #2   Title understand how to weight traing without bearing down on the pelvic floor and correct posture    Baseline not educated yet    Time 4    Period Months    Status New    Target Date 01/15/21      PT LONG TERM GOAL #3   Title pelvic floor strength >/= 3/5 to reduce straining on the pelvic floor during lifting and exericse    Baseline pelvic floor strength is 1/5    Time 4    Period Months    Status New    Target Date 01/15/21      PT LONG TERM GOAL #4   Title understand ways to protect the pelvic floor during activity to reduce straing and further elongation of the tissue    Baseline not educated yet    Time 4    Period Months    Status New    Target Date 01/15/21                    Plan - 09/15/20 1638     Clinical Impression Statement Patient is a 45 year old female with pelvic floor weakness after having a vaginal hysterectomy, anterior and posterior repair on 06/08/2020. She has weakness in bilateral hips and abdominals. She has a  diastasis rect 1 finger width above and below the umbilicus. She has difficulty with contracting her lower abdominals. Her pelvic floor strength is 1/5 when she is able to incorporate the lower abdominal contraction and therapist giving tactile cues to the pelvic floor. Her left introitus is tighter than right. She will bear down when asked to contract the pelvic floor. Patient has decreased awareness of her pelvic floor and the feeling of it contracting. Patient will benefit from skilled therapy to improve pelvic floor strength and coordination while engaging the core.    Personal Factors and Comorbidities Comorbidity 1;Fitness    Comorbidities vaginal  hysterectomy and anterior and posterior repair on 06/08/2020    Examination-Activity Limitations Caring for Others;Carry;Lift    Examination-Participation Restrictions Community Activity    Stability/Clinical Decision Making Stable/Uncomplicated    Clinical Decision Making Low    Rehab Potential Excellent    PT Frequency 1x / week    PT Duration Other (comment)   4 months   PT Treatment/Interventions ADLs/Self Care Home Management;Biofeedback;Electrical Stimulation;Therapeutic activities;Therapeutic exercise;Neuromuscular re-education;Patient/family education;Manual techniques    PT Next Visit Plan work on abdominal contraction with movement, pressing into a ball, in quadruped; go over lifting techniques; bowel movements without straining    PT Home Exercise Plan Access Code: [email or web address]    Consulted and Agree with Plan of Care Patient             Patient will benefit from skilled therapeutic intervention in order to improve the following deficits and impairments:  Decreased endurance, Decreased activity tolerance, Increased fascial restricitons, Decreased strength, Decreased coordination  Visit Diagnosis: Muscle weakness (generalized) - Plan: PT plan of care cert/re-cert  Other lack of  coordination - Plan: PT plan of care cert/re-cert     Problem List Patient Active Problem List   Diagnosis Date Noted   Pain in left knee 09/11/2019   HTN (hypertension) 02/27/2015   Unintended weight loss 10/27/2014   Allergic rhinitis 06/23/2014   Vitamin D insufficiency 03/03/2014   Pilonidal cyst 03/03/2014   Broken teeth 03/03/2014   Poor sleep pattern 03/03/2014   Obesity (BMI 30-39.9) 03/03/2014   Former smoker 03/03/2014   History of anemia 05/27/2013    Earlie Counts, PT 09/15/20 4:59 PM   [place] [facility] at [facility] [address] [place], [place], [address] Phone: [phone]   Fax:  [phone]  Name: Emily Saunders MRN:  [id number] Date of Birth: [birth year]

## 2020-09-22 ENCOUNTER — Encounter: Payer: Medicaid Other | Admitting: Physical Therapy

## 2020-09-22 ENCOUNTER — Encounter: Payer: Self-pay | Admitting: Physical Therapy

## 2020-09-22 ENCOUNTER — Other Ambulatory Visit: Payer: Self-pay

## 2020-09-22 DIAGNOSIS — R278 Other lack of coordination: Secondary | ICD-10-CM | POA: Diagnosis not present

## 2020-09-22 DIAGNOSIS — M6281 Muscle weakness (generalized): Secondary | ICD-10-CM | POA: Diagnosis not present

## 2020-09-22 NOTE — Therapy (Addendum)
[facility] at [facility] [address] [place], [place], [address] Phone: [phone]   Fax:  [phone]  Physical Therapy Treatment  Patient Details  Name: Emily Saunders MRN: [id number] Date of Birth: 12-Jan-1976 Referring Provider (PT): Dr. Sherlene Shams   Encounter Date: 09/22/2020   PT End of Session - 09/22/20 1501     Visit Number 2    Date for PT Re-Evaluation 01/15/21    Authorization Type [employer]    Authorization Time Period 8/10-11/30    Authorization - Visit Number 1    Authorization - Number of Visits 12    PT Start Time 1400    PT Stop Time 5974    PT Time Calculation (min) 45 min    Activity Tolerance Patient tolerated treatment well    Behavior During Therapy [facility] for tasks assessed/performed             Past Medical History:  Diagnosis Date   GERD (gastroesophageal reflux disease)    H/O pilonidal cyst 2002    History of hiatal hernia    Hypertension    PID (acute pelvic inflammatory disease) 2006    Pneumonia 2016   Vitamin D deficiency 2015    Wears dentures    FULL SET    Past Surgical History:  Procedure Laterality Date   ANTERIOR AND POSTERIOR REPAIR N/A 06/08/2020   Procedure: ANTERIOR (CYSTOCELE) AND POSTERIOR REPAIR (RECTOCELE) with perineorrhaphy;  Surgeon: Jaquita Folds, MD;  Location: [place];  Service: Gynecology;  Laterality: N/A;   CESAREAN SECTION     X 1   CYST EXCISION Right 2003   hand   CYSTOSCOPY N/A 06/08/2020   Procedure: CYSTOSCOPY;  Surgeon: Jaquita Folds, MD;  Location: [facility];  Service: Gynecology;  Laterality: N/A;   NASAL SINUS SURGERY Bilateral 02/10/2020   Procedure: ENDOSCOPIC SINUS SURGERY Maxillary Anstrostomy with Tissue Removal;  Surgeon: Izora Gala, MD;  Location: [place];  Service: ENT;  Laterality: Bilateral;   VAGINAL HYSTERECTOMY N/A 06/08/2020   Procedure: HYSTERECTOMY VAGINAL  with bilateral salpingectomy;  Surgeon: Jaquita Folds, MD;  Location: [facility];  Service: Gynecology;  Laterality: N/A;   VAGINAL PROLAPSE REPAIR N/A 06/08/2020   Procedure: UTEROSACRAL SUSPENSION;  Surgeon: Jaquita Folds, MD;  Location: [facility];  Service: Gynecology;  Laterality: N/A;    There were no vitals filed for this visit.   Subjective Assessment - 09/22/20 1402     Subjective I felt good after last visit.    Patient Stated Goals get stronger in core to get back to exercise    Currently in Pain? No/denies    Multiple Pain Sites No                               [facility] Adult PT Treatment/Exercise - 09/22/20 0001       Therapeutic Activites    Therapeutic Activities Other Therapeutic Activities    Other Therapeutic Activities educated patient on how to toilet correctly, breath into her stomach, how to breath out to generate ;pressure to push stool out and to have her feet on a stool      Exercises   Exercises Other Exercises    Other Exercises  discussed with patient on walking 20-30 minutes per day to work on her cardio      Lumbar Exercises: Supine  Bridge with [employer] 10 reps;1 second    Bridge with [employer] Limitations with arm flexion    Isometric Hip Flexion 5 reps;5 seconds   each leg   Isometric Hip Flexion Limitations with therapist finger in the vaginal canal to palapate the contraction      Lumbar Exercises: Quadruped   Opposite Arm/Leg Raise Right arm/Left leg;Left arm/Right leg;10 reps;1 second    Opposite Arm/Leg Raise Limitations tried with pushing into a ball with one arm, then push hand into ball with one arm and lift leg but too easy                    PT Education - 09/22/20 1500     Education Details education on toileting;Access Code: [email or web address]    Person(s) Educated Patient    Methods Explanation;Demonstration;Verbal cues;Handout    Comprehension Returned  demonstration;Verbalized understanding              PT Short Term Goals - 09/22/20 1505       PT SHORT TERM GOAL #1   Title independent with initial HEP    Time 4    Period Weeks    Status On-going               PT Long Term Goals - 09/15/20 1653       PT LONG TERM GOAL #1   Title independent with core and pelvic floor strengthening    Baseline not educated yet    Time 4    Period Months    Status New    Target Date 01/15/21      PT LONG TERM GOAL #2   Title understand how to weight traing without bearing down on the pelvic floor and correct posture    Baseline not educated yet    Time 4    Period Months    Status New    Target Date 01/15/21      PT LONG TERM GOAL #3   Title pelvic floor strength >/= 3/5 to reduce straining on the pelvic floor during lifting and exericse    Baseline pelvic floor strength is 1/5    Time 4    Period Months    Status New    Target Date 01/15/21      PT LONG TERM GOAL #4   Title understand ways to protect the pelvic floor during activity to reduce straing and further elongation of the tissue    Baseline not educated yet    Time 4    Period Months    Status New    Target Date 01/15/21                   Plan - 09/22/20 1502     Clinical Impression Statement Patient is able to feel the pelvic floor contract. She is able to contract her lower abdominals correctly. She has some difficulty keeping her trunk stable with lifting opposite arm and leg in quadruped. Patient has some difficuty with side planks due to decreased core strength. She is able to return demostration of correct toileting technique. Patient will benefit from skilled therapy to improve pelvic floor strength and coordination while engaging the core.    Personal Factors and Comorbidities Comorbidity 1;Fitness    Comorbidities vaginal hysterectomy and anterior and posterior repair on 06/08/2020    Examination-Activity Limitations Caring for  Others;Carry;Lift    Examination-Participation Restrictions Community Activity    Stability/Clinical Decision Making Stable/Uncomplicated  Rehab Potential Excellent    PT Frequency 1x / week    PT Duration Other (comment)   4 months   PT Treatment/Interventions ADLs/Self Care Home Management;Biofeedback;Electrical Stimulation;Therapeutic activities;Therapeutic exercise;Neuromuscular re-education;Patient/family education;Manual techniques    PT Next Visit Plan work on abdominal contraction with movement, pressing into a ball, in quadruped; go over lifting techniques; hip hinge with band in standing with abdominal contraction and keep arms steady; squat with pulling on band to practice with breath; lifting with correct breath; overhead lifting with one arm sitting then go to 2 arm    PT Home Exercise Plan Access Code: [email or web address]    Recommended Other Services MD signed initial summary    Consulted and Agree with Plan of Care Patient             Patient will benefit from skilled therapeutic intervention in order to improve the following deficits and impairments:  Decreased endurance, Decreased activity tolerance, Increased fascial restricitons, Decreased strength, Decreased coordination  Visit Diagnosis: Muscle weakness (generalized)  Other lack of coordination     Problem List Patient Active Problem List   Diagnosis Date Noted   Pain in left knee 09/11/2019   HTN (hypertension) 02/27/2015   Unintended weight loss 10/27/2014   Allergic rhinitis 06/23/2014   Vitamin D insufficiency 03/03/2014   Pilonidal cyst 03/03/2014   Broken teeth 03/03/2014   Poor sleep pattern 03/03/2014   Obesity (BMI 30-39.9) 03/03/2014   Former smoker 03/03/2014   History of anemia 05/27/2013    Earlie Counts, PT 09/22/20 3:20 PM  [place] [facility] [place] [facility] [address] [place], [place], [address] Phone: [phone]   Fax:  [phone]  Name: Emily Saunders MRN: [id number] Date of Birth: 1975-03-13  PHYSICAL THERAPY DISCHARGE SUMMARY  Visits from Start of Care: 2  Current functional level related to goals / functional outcomes: See above. Unable to assess patient for discharge due to her not returning to therapy.    Remaining deficits: See above.    Education / Equipment: HEP   Patient agrees to discharge. Patient goals were not met. Patient is being discharged due to not returning since the last visit. Thank you for the referral. Earlie Counts, PT 01/05/21 8:38 AM

## 2020-09-22 NOTE — Patient Instructions (Addendum)
Toileting Techniques for Bowel Movements    An Evacuation/Defecation Plan   Here are the 4 basic points:  Lean forward enough for your elbows to rest on your knees Support your feet on the floor or use a low stool if your feet don't touch the floor  Push out your belly as if you have swallowed a beach ball--you should feel a widening of your waist. "Belly Big, Belly Hard" Open and relax your pelvic floor muscles, rather than tightening around the anus  While you are sitting on the toilet pay attention to the following areas: Jaw and mouth position- relaxed not clenched Angle of your hips - leaning slightly forward Whether your feet touch the ground or not - should be flat and supported Arm placement - rest against your thighs Spine position - flat back Waist Breathing - exhale as you push (like blowing up a balloon or try using other sounds such as ahhhh, shhhhh, ohhhh or grrrrrrr) Beulah Gandy - hard and tight as you push Anus (opening of the anal canal) - relaxed and open as you push Anus - Tighten and lift pulling the muscle back in after you are done or if taking a break  If you are not successful after 10-15 minutes, try again later.  Avoid negative self-talk about your toileting experience.   Read this for more details and ask your PT if you need suggestions for adjustments or limitations:  Sitting on the toilet  a) Make sure your feet are supported - flat on the floor or step stool b) Many people find it effective to lean forward or raise their knees.  Propping your feet on a step stool (Squatty Potty is a brand name) can help the muscles around the anus to relax  c) When you lean forward, place your forearms on your thighs for support  Relaxing Breathe deeply and slowly in through your nose and out through your mouth. To become aware of how to relax your muscles, contracting and releasing muscles can be helpful.  Pull your pelvic floor muscles in tightly by using the image of  holding back gas, or closing around the anus (visualize making a circle smaller) and lifting the anus up and in.  Then release the muscles and your anus should drop down and feel open. Repeat 5 times ending with the feeling of relaxation. Keep your pelvic floor muscles relaxed; let your belly bulge out. The digestive tract starts at the mouth and ends at the anal opening, so be sure to relax both ends of the tube.  Place your tongue on the roof of your mouth with your teeth separated.  This helps relax your mouth and will help to relax the anus at the same time.  Emptying (defecation) a) Keep your pelvic floor and sphincter relaxed, then bulge your anal muscles.  Make the anal opening wide.  b) Stick your belly out as if you have swallowed a beach ball. c) Make your belly wall hard using your belly muscles while continuing to breathe. Doing this makes it easier to open your anus. d) Breath out and give a grunt (or try using other sounds such as ahhhh, shhhhh, ohhhh or grrrrrrr). e)  Can also try to act as if you are blowing up a balloon as you push  4) Finishing a) As you finish your bowel movement, pull the pelvic floor muscles up and in.  This will leave your anus in the proper place rather than remaining pushed out and down. If  you leave your anus pushed out and down, it will start to feel as though that is normal and give you incorrect signals about needing to have a bowel movement.  Earlie Counts, PT [facility] [address] [place], [place] [address] W: [phone] [email or web address]  Access Code: [email or web address] URL: [email or web address] Date: 09/22/2020 Prepared by: Earlie Counts  Exercises Hooklying Isometric Hip Flexion - 1 x daily - 7 x weekly - 1 sets - 10 reps - 5 sec hold Supine Hip Adduction Isometric with Ball - 1 x daily - 7 x weekly - 1 sets - 10 reps - 5 sec hold Sidelying Hip Adduction Isometric with Ball - 1 x daily - 7 x  weekly - 1 sets - 10 reps - 5 sec hold Quadruped Pelvic Floor Contraction with Opposite Arm and Leg Lift - 1 x daily - 3 x weekly - 10 reps Side Plank on Knees - 1 x daily - 3 x weekly - 1 sets - 10 reps Dead Bug - 1 x daily - 3 x weekly - 1 sets - 10 reps Bridge with Arms Raised - 1 x daily - 3 x weekly - 1 sets - 10 reps

## 2020-09-29 NOTE — Progress Notes (Signed)
I, Wendy Poet, LAT, ATC, am serving as scribe for Dr. Lynne Leader.  Emily Saunders is a 45 y.o. female who presents to [place] at [facility] today for f/u of L ant shoulder pain.  She was last seen by Dr. Georgina Snell on 08/19/20 and had a L subacromial steroid injection.  She was referred to PT of which she has completed 2 sessions.  Since her last visit, pt reports L shoulder is feeling "horrible." Pt notes she is not able to do both pelvic PT and PT for her shoulder at the same time. Pt reports she got about 2 weeks of relief from the prior steroid injection.  She notes popping and clicking in her left shoulder.  Pertinent review of systems: No fevers or chills  Relevant historical information: HTN.  Currently receiving pelvic physical therapy.   Exam:  BP 124/82   Pulse 84   Ht '5\' 6"'$  (1.676 m)   Wt 206 lb 6.4 oz (93.6 kg)   LMP 08/31/2020   SpO2 97%   BMI 33.31 kg/m  General: Well Developed, well nourished, and in no acute distress.   MSK: Left shoulder normal-appearing normal motion.  Pain with abduction. Intact strength.  Exam positive Hawkins and Neer's test positive empty can test positive O'Brien test. Negative Yergason's and speeds test.    Lab and Radiology Results  X-ray images left shoulder obtained today personally and independently interpreted No significant arthritis.  No acute fractures visible.  Possible avulsion fleck present on AP view. Await formal radiology review     Assessment and Plan: 45 y.o. female with left shoulder pain failing to improve with conservative management including home exercise program and subacromial injection.  Plan for MRI arthrogram.  This should help evaluate cause of pain.  Does have mechanical symptoms so MRI arthrogram is indicated above regular MRI.  We will go ahead and asked Dr. Dianah Field to perform a steroid injection as part of the arthrogram as well.  Recheck after MRI results are back.   PDMP  not reviewed this encounter. Orders Placed This Encounter  Procedures   MR Shoulder Left W Contrast    MRI arthrogram. No IV contrast. Schedule with Dr T 1 hr prior to MRI for injection    Standing Status:   Future    Standing Expiration Date:   09/30/2021    Scheduling Instructions:     MRI arthrogram. No IV contrast. Schedule with Dr T 1 hr prior to MRI for injection    Order Specific Question:   If indicated for the ordered procedure, I authorize the administration of contrast media per Radiology protocol    Answer:   Yes    Order Specific Question:   What is the patient's sedation requirement?    Answer:   No Sedation    Order Specific Question:   Does the patient have a pacemaker or implanted devices?    Answer:   No    Order Specific Question:   Preferred imaging location?    Answer:   Product[facility]process development scientist (table limit-350lbs)   DG Shoulder Left    Standing Status:   Future    Number of Occurrences:   1    Standing Expiration Date:   09/30/2021    Order Specific Question:   Reason for Exam (SYMPTOM  OR DIAGNOSIS REQUIRED)    Answer:   eval shoulder pain    Order Specific Question:   Is patient pregnant?    Answer:  No    Order Specific Question:   Preferred imaging location?    Answer:   [facility]   No orders of the defined types were placed in this encounter.    Discussed warning signs or symptoms. Please see discharge instructions. Patient expresses understanding.   The above documentation has been reviewed and is accurate and complete Lynne Leader, M.D.

## 2020-09-30 ENCOUNTER — Other Ambulatory Visit: Payer: Self-pay

## 2020-09-30 ENCOUNTER — Ambulatory Visit (INDEPENDENT_AMBULATORY_CARE_PROVIDER_SITE_OTHER): Payer: Medicaid Other

## 2020-09-30 ENCOUNTER — Ambulatory Visit: Payer: Medicaid Other | Admitting: Family Medicine

## 2020-09-30 VITALS — BP 124/82 | HR 84 | Ht 66.0 in | Wt 206.4 lb

## 2020-09-30 DIAGNOSIS — M25512 Pain in left shoulder: Secondary | ICD-10-CM

## 2020-09-30 NOTE — Patient Instructions (Addendum)
Thank you for coming in today.   Please get an Xray today before you leave   You should hear from MRI scheduling within 1 week. If you do not hear please let me know.    Recheck after the MRI.

## 2020-09-30 NOTE — Addendum Note (Signed)
Addended by: Mare Ferrari on: 09/30/2020 02:14 PM   Modules accepted: Orders

## 2020-10-01 NOTE — Progress Notes (Signed)
Left shoulder x-ray looks normal to radiology

## 2020-10-05 ENCOUNTER — Telehealth: Payer: Self-pay | Admitting: *Deleted

## 2020-10-05 NOTE — Telephone Encounter (Signed)
Pt's MRI L shoulder was sent to clinical review, OV notes have been faxed but insurance is going to deny the pre-cert due to the pt not having a minimum of 6-week conservative therapy.

## 2020-10-07 ENCOUNTER — Other Ambulatory Visit: Payer: Self-pay | Admitting: Family Medicine

## 2020-10-07 DIAGNOSIS — M25512 Pain in left shoulder: Secondary | ICD-10-CM

## 2020-10-13 ENCOUNTER — Observation Stay (HOSPITAL_COMMUNITY)
Admission: EM | Admit: 2020-10-13 | Discharge: 2020-10-15 | Disposition: A | Discharge status: Home / Self Care | Payer: Medicaid Other | Attending: Surgery | Admitting: Surgery

## 2020-10-13 ENCOUNTER — Other Ambulatory Visit: Payer: Self-pay

## 2020-10-13 ENCOUNTER — Encounter (HOSPITAL_COMMUNITY): Payer: Self-pay

## 2020-10-13 ENCOUNTER — Emergency Department (HOSPITAL_COMMUNITY): Payer: Medicaid Other | Admitting: Anesthesiology

## 2020-10-13 ENCOUNTER — Emergency Department (HOSPITAL_COMMUNITY): Payer: Medicaid Other

## 2020-10-13 ENCOUNTER — Encounter (HOSPITAL_COMMUNITY)
Admission: EM | Disposition: A | Discharge status: Home / Self Care | Payer: Self-pay | Source: Home / Self Care | Attending: Emergency Medicine

## 2020-10-13 DIAGNOSIS — K8010 Calculus of gallbladder with chronic cholecystitis without obstruction: Secondary | ICD-10-CM | POA: Diagnosis not present

## 2020-10-13 DIAGNOSIS — K8012 Calculus of gallbladder with acute and chronic cholecystitis without obstruction: Principal | ICD-10-CM | POA: Insufficient documentation

## 2020-10-13 DIAGNOSIS — I10 Essential (primary) hypertension: Secondary | ICD-10-CM | POA: Diagnosis not present

## 2020-10-13 DIAGNOSIS — R1011 Right upper quadrant pain: Secondary | ICD-10-CM | POA: Diagnosis not present

## 2020-10-13 DIAGNOSIS — K8000 Calculus of gallbladder with acute cholecystitis without obstruction: Secondary | ICD-10-CM | POA: Diagnosis not present

## 2020-10-13 DIAGNOSIS — Z20822 Contact with and (suspected) exposure to covid-19: Secondary | ICD-10-CM | POA: Diagnosis not present

## 2020-10-13 DIAGNOSIS — K8020 Calculus of gallbladder without cholecystitis without obstruction: Secondary | ICD-10-CM | POA: Diagnosis not present

## 2020-10-13 DIAGNOSIS — E559 Vitamin D deficiency, unspecified: Secondary | ICD-10-CM | POA: Diagnosis not present

## 2020-10-13 DIAGNOSIS — Z87891 Personal history of nicotine dependence: Secondary | ICD-10-CM | POA: Insufficient documentation

## 2020-10-13 DIAGNOSIS — J309 Allergic rhinitis, unspecified: Secondary | ICD-10-CM | POA: Diagnosis not present

## 2020-10-13 DIAGNOSIS — Z79899 Other long term (current) drug therapy: Secondary | ICD-10-CM | POA: Diagnosis not present

## 2020-10-13 LAB — URINALYSIS, ROUTINE W REFLEX MICROSCOPIC
Bilirubin Urine: NEGATIVE
Glucose, UA: NEGATIVE mg/dL
Hgb urine dipstick: NEGATIVE
Ketones, ur: NEGATIVE mg/dL
Leukocytes,Ua: NEGATIVE
Nitrite: NEGATIVE
Protein, ur: NEGATIVE mg/dL
Specific Gravity, Urine: 1.02 (ref 1.005–1.030)
pH: 7 (ref 5.0–8.0)

## 2020-10-13 LAB — CBC WITH DIFFERENTIAL/PLATELET
Abs Immature Granulocytes: 0.05 10*3/uL (ref 0.00–0.07)
Basophils Absolute: 0 10*3/uL (ref 0.0–0.1)
Basophils Relative: 0 %
Eosinophils Absolute: 0.2 10*3/uL (ref 0.0–0.5)
Eosinophils Relative: 3 %
HCT: 38.3 % (ref 36.0–46.0)
Hemoglobin: 12.3 g/dL (ref 12.0–15.0)
Immature Granulocytes: 1 %
Lymphocytes Relative: 25 %
Lymphs Abs: 1.9 10*3/uL (ref 0.7–4.0)
MCH: 29.9 pg (ref 26.0–34.0)
MCHC: 32.1 g/dL (ref 30.0–36.0)
MCV: 93.2 fL (ref 80.0–100.0)
Monocytes Absolute: 0.4 10*3/uL (ref 0.1–1.0)
Monocytes Relative: 6 %
Neutro Abs: 4.7 10*3/uL (ref 1.7–7.7)
Neutrophils Relative %: 65 %
Platelets: 206 10*3/uL (ref 150–400)
RBC: 4.11 MIL/uL (ref 3.87–5.11)
RDW: 14.1 % (ref 11.5–15.5)
WBC: 7.3 10*3/uL (ref 4.0–10.5)
nRBC: 0 % (ref 0.0–0.2)

## 2020-10-13 LAB — COMPREHENSIVE METABOLIC PANEL
ALT: 14 U/L (ref 0–44)
AST: 15 U/L (ref 15–41)
Albumin: 4.3 g/dL (ref 3.5–5.0)
Alkaline Phosphatase: 34 U/L — ABNORMAL LOW (ref 38–126)
Anion gap: 7 (ref 5–15)
BUN: 13 mg/dL (ref 6–20)
CO2: 26 mmol/L (ref 22–32)
Calcium: 8.8 mg/dL — ABNORMAL LOW (ref 8.9–10.3)
Chloride: 107 mmol/L (ref 98–111)
Creatinine, Ser: 0.89 mg/dL (ref 0.44–1.00)
GFR, Estimated: >60 mL/min (ref >60)
Glucose, Bld: 111 mg/dL — ABNORMAL HIGH (ref 70–99)
Potassium: 3.7 mmol/L (ref 3.5–5.1)
Sodium: 140 mmol/L (ref 135–145)
Total Bilirubin: 0.4 mg/dL (ref 0.3–1.2)
Total Protein: 7.4 g/dL (ref 6.5–8.1)

## 2020-10-13 LAB — I-STAT BETA HCG BLOOD, ED (MC, WL, AP ONLY): I-stat hCG, quantitative: <5 m[IU]/mL (ref <5)

## 2020-10-13 LAB — RESP PANEL BY RT-PCR (FLU A&B, COVID) ARPGX2
Influenza A by PCR: NEGATIVE
Influenza B by PCR: NEGATIVE
SARS Coronavirus 2 by RT PCR: NEGATIVE

## 2020-10-13 LAB — LIPASE, BLOOD: Lipase: 33 U/L (ref 11–51)

## 2020-10-13 SURGERY — CHOLECYSTECTOMY, ROBOT-ASSISTED, LAPAROSCOPIC
Anesthesia: General | Site: Abdomen

## 2020-10-13 MED ORDER — ONDANSETRON 4 MG PO TBDP: 4.0000 mg | ORAL_TABLET | Freq: Four times a day (QID) | ORAL | Status: DC | PRN

## 2020-10-13 MED ORDER — SCOPOLAMINE 1 MG/3DAYS TD PT72
1.0000 | MEDICATED_PATCH | TRANSDERMAL | Status: DC
  Administered 2020-10-13: 1.5 mg via TRANSDERMAL
  Filled 2020-10-13: qty 1

## 2020-10-13 MED ORDER — INDOCYANINE GREEN 25 MG IV SOLR
2.5000 mg | Freq: Once | INTRAVENOUS | Status: AC
  Administered 2020-10-13: 2.5 mg via INTRAVENOUS
  Filled 2020-10-13: qty 1

## 2020-10-13 MED ORDER — IBUPROFEN 400 MG PO TABS
600.0000 mg | ORAL_TABLET | Freq: Three times a day (TID) | ORAL | Status: DC | PRN
  Administered 2020-10-14: 600 mg via ORAL
  Filled 2020-10-13: qty 1

## 2020-10-13 MED ORDER — LIDOCAINE 2% (20 MG/ML) 5 ML SYRINGE
INTRAMUSCULAR | Status: DC | PRN
  Administered 2020-10-13: 60 mg via INTRAVENOUS

## 2020-10-13 MED ORDER — SODIUM CHLORIDE 0.9 % IV SOLN
2.0000 g | INTRAVENOUS | Status: AC
  Administered 2020-10-13: 2 g via INTRAVENOUS
  Filled 2020-10-13: qty 20

## 2020-10-13 MED ORDER — PHENYLEPHRINE HCL-NACL 20-0.9 MG/250ML-% IV SOLN
INTRAVENOUS | Status: DC | PRN
  Administered 2020-10-13: 35 ug/min via INTRAVENOUS

## 2020-10-13 MED ORDER — PHENYLEPHRINE 40 MCG/ML (10ML) SYRINGE FOR IV PUSH (FOR BLOOD PRESSURE SUPPORT)
PREFILLED_SYRINGE | INTRAVENOUS | Status: DC | PRN
  Administered 2020-10-13: 120 ug via INTRAVENOUS
  Administered 2020-10-13: 80 ug via INTRAVENOUS

## 2020-10-13 MED ORDER — ONDANSETRON HCL 4 MG/2ML IJ SOLN: 4.0000 mg | Freq: Four times a day (QID) | INTRAMUSCULAR | Status: DC | PRN

## 2020-10-13 MED ORDER — MIDAZOLAM HCL 2 MG/2ML IJ SOLN
INTRAMUSCULAR | Status: AC
  Filled 2020-10-13: qty 2

## 2020-10-13 MED ORDER — DIPHENHYDRAMINE HCL 50 MG/ML IJ SOLN
25.0000 mg | Freq: Four times a day (QID) | INTRAMUSCULAR | Status: DC | PRN
Start: 2020-10-13 — End: 2020-10-15

## 2020-10-13 MED ORDER — OXYCODONE HCL 5 MG PO TABS
5.0000 mg | ORAL_TABLET | ORAL | Status: DC | PRN
Start: 2020-10-13 — End: 2020-10-14
  Administered 2020-10-13: 5 mg via ORAL
  Administered 2020-10-14 (×2): 10 mg via ORAL
  Filled 2020-10-13: qty 1
  Filled 2020-10-13 (×2): qty 2

## 2020-10-13 MED ORDER — 0.9 % SODIUM CHLORIDE (POUR BTL) OPTIME
TOPICAL | Status: DC | PRN
  Administered 2020-10-13: 1000 mL

## 2020-10-13 MED ORDER — ONDANSETRON HCL 4 MG/2ML IJ SOLN
INTRAMUSCULAR | Status: AC
  Filled 2020-10-13: qty 2

## 2020-10-13 MED ORDER — PROPOFOL 10 MG/ML IV BOLUS
INTRAVENOUS | Status: DC | PRN
  Administered 2020-10-13: 150 mg via INTRAVENOUS

## 2020-10-13 MED ORDER — LACTATED RINGERS IR SOLN
Status: DC | PRN
  Administered 2020-10-13: 1000 mL

## 2020-10-13 MED ORDER — SUGAMMADEX SODIUM 200 MG/2ML IV SOLN
INTRAVENOUS | Status: DC | PRN
  Administered 2020-10-13: 200 mg via INTRAVENOUS

## 2020-10-13 MED ORDER — OXYCODONE HCL 5 MG PO TABS: 5.0000 mg | ORAL_TABLET | Freq: Once | ORAL | Status: DC | PRN

## 2020-10-13 MED ORDER — PROPOFOL 10 MG/ML IV BOLUS
INTRAVENOUS | Status: AC
  Filled 2020-10-13: qty 20

## 2020-10-13 MED ORDER — FENTANYL CITRATE PF 50 MCG/ML IJ SOSY
25.0000 ug | PREFILLED_SYRINGE | INTRAMUSCULAR | Status: DC | PRN
  Administered 2020-10-13: 50 ug via INTRAVENOUS

## 2020-10-13 MED ORDER — ACETAMINOPHEN 325 MG PO TABS: 325.0000 mg | ORAL_TABLET | ORAL | Status: DC | PRN

## 2020-10-13 MED ORDER — HYDROMORPHONE HCL 1 MG/ML IJ SOLN: 0.5000 mg | INTRAMUSCULAR | Status: DC | PRN

## 2020-10-13 MED ORDER — ONDANSETRON HCL 4 MG/2ML IJ SOLN
4.0000 mg | Freq: Once | INTRAMUSCULAR | Status: AC
  Administered 2020-10-13: 4 mg via INTRAVENOUS
  Filled 2020-10-13: qty 2

## 2020-10-13 MED ORDER — FAMOTIDINE 20 MG PO TABS
20.0000 mg | ORAL_TABLET | Freq: Two times a day (BID) | ORAL | Status: DC
  Administered 2020-10-13 – 2020-10-15 (×4): 20 mg via ORAL
  Filled 2020-10-13 (×4): qty 1

## 2020-10-13 MED ORDER — SODIUM CHLORIDE (PF) 0.9 % IJ SOLN
INTRAMUSCULAR | Status: DC | PRN
  Administered 2020-10-13: 10 mL

## 2020-10-13 MED ORDER — KETAMINE HCL 10 MG/ML IJ SOLN
INTRAMUSCULAR | Status: DC | PRN
  Administered 2020-10-13: 30 mg via INTRAVENOUS

## 2020-10-13 MED ORDER — LIDOCAINE 2% (20 MG/ML) 5 ML SYRINGE
INTRAMUSCULAR | Status: DC | PRN
  Administered 2020-10-13: 1.5 mg/kg/h via INTRAVENOUS

## 2020-10-13 MED ORDER — SODIUM CHLORIDE 0.9 % IV BOLUS
1000.0000 mL | Freq: Once | INTRAVENOUS | Status: AC
  Administered 2020-10-13: 1000 mL via INTRAVENOUS

## 2020-10-13 MED ORDER — DIPHENHYDRAMINE HCL 25 MG PO CAPS
25.0000 mg | ORAL_CAPSULE | Freq: Four times a day (QID) | ORAL | Status: DC | PRN
  Administered 2020-10-14: 25 mg via ORAL
  Filled 2020-10-13 (×2): qty 1

## 2020-10-13 MED ORDER — FENTANYL CITRATE (PF) 250 MCG/5ML IJ SOLN
INTRAMUSCULAR | Status: DC | PRN
  Administered 2020-10-13 (×5): 50 ug via INTRAVENOUS

## 2020-10-13 MED ORDER — AMLODIPINE BESYLATE 10 MG PO TABS
10.0000 mg | ORAL_TABLET | Freq: Every day | ORAL | Status: DC
  Administered 2020-10-14 – 2020-10-15 (×2): 10 mg via ORAL
  Filled 2020-10-13 (×2): qty 1

## 2020-10-13 MED ORDER — ONDANSETRON HCL 4 MG/2ML IJ SOLN
4.0000 mg | Freq: Once | INTRAMUSCULAR | Status: AC | PRN
  Administered 2020-10-13: 4 mg via INTRAVENOUS

## 2020-10-13 MED ORDER — ACETAMINOPHEN 160 MG/5ML PO SOLN: 325.0000 mg | ORAL | Status: DC | PRN

## 2020-10-13 MED ORDER — BUPIVACAINE LIPOSOME 1.3 % IJ SUSP
INTRAMUSCULAR | Status: AC
  Filled 2020-10-13: qty 20

## 2020-10-13 MED ORDER — ACETAMINOPHEN 500 MG PO TABS
1000.0000 mg | ORAL_TABLET | Freq: Four times a day (QID) | ORAL | Status: DC
  Administered 2020-10-13 – 2020-10-14 (×4): 1000 mg via ORAL
  Filled 2020-10-13 (×4): qty 2

## 2020-10-13 MED ORDER — ACETAMINOPHEN 500 MG PO TABS
1000.0000 mg | ORAL_TABLET | ORAL | Status: AC
  Administered 2020-10-13: 1000 mg via ORAL
  Filled 2020-10-13: qty 2

## 2020-10-13 MED ORDER — FENTANYL CITRATE PF 50 MCG/ML IJ SOSY
PREFILLED_SYRINGE | INTRAMUSCULAR | Status: AC
  Filled 2020-10-13: qty 1

## 2020-10-13 MED ORDER — ONDANSETRON HCL 4 MG/2ML IJ SOLN
INTRAMUSCULAR | Status: DC | PRN
  Administered 2020-10-13: 4 mg via INTRAVENOUS

## 2020-10-13 MED ORDER — LIDOCAINE 2% (20 MG/ML) 5 ML SYRINGE
INTRAMUSCULAR | Status: AC
  Filled 2020-10-13: qty 15

## 2020-10-13 MED ORDER — MIDAZOLAM HCL 5 MG/5ML IJ SOLN
INTRAMUSCULAR | Status: DC | PRN
  Administered 2020-10-13: 2 mg via INTRAVENOUS

## 2020-10-13 MED ORDER — HYDROMORPHONE HCL 1 MG/ML IJ SOLN
0.5000 mg | Freq: Once | INTRAMUSCULAR | Status: AC
  Administered 2020-10-13: 0.5 mg via INTRAVENOUS
  Filled 2020-10-13: qty 1

## 2020-10-13 MED ORDER — DEXAMETHASONE SODIUM PHOSPHATE 10 MG/ML IJ SOLN
INTRAMUSCULAR | Status: AC
  Filled 2020-10-13: qty 1

## 2020-10-13 MED ORDER — MEPERIDINE HCL 50 MG/ML IJ SOLN: 6.2500 mg | INTRAMUSCULAR | Status: DC | PRN

## 2020-10-13 MED ORDER — ROCURONIUM BROMIDE 10 MG/ML (PF) SYRINGE
PREFILLED_SYRINGE | INTRAVENOUS | Status: AC
  Filled 2020-10-13: qty 10

## 2020-10-13 MED ORDER — ENOXAPARIN SODIUM 40 MG/0.4ML IJ SOSY
40.0000 mg | PREFILLED_SYRINGE | INTRAMUSCULAR | Status: DC
  Administered 2020-10-14: 40 mg via SUBCUTANEOUS
  Filled 2020-10-13: qty 0.4

## 2020-10-13 MED ORDER — OXYCODONE HCL 5 MG/5ML PO SOLN: 5.0000 mg | Freq: Once | ORAL | Status: DC | PRN

## 2020-10-13 MED ORDER — ROCURONIUM BROMIDE 10 MG/ML (PF) SYRINGE
PREFILLED_SYRINGE | INTRAVENOUS | Status: DC | PRN
  Administered 2020-10-13 (×4): 10 mg via INTRAVENOUS
  Administered 2020-10-13: 70 mg via INTRAVENOUS

## 2020-10-13 MED ORDER — BUPIVACAINE LIPOSOME 1.3 % IJ SUSP
INTRAMUSCULAR | Status: DC | PRN
  Administered 2020-10-13: 20 mL

## 2020-10-13 MED ORDER — PHENYLEPHRINE 40 MCG/ML (10ML) SYRINGE FOR IV PUSH (FOR BLOOD PRESSURE SUPPORT)
PREFILLED_SYRINGE | INTRAVENOUS | Status: AC
  Filled 2020-10-13: qty 10

## 2020-10-13 MED ORDER — DEXAMETHASONE SODIUM PHOSPHATE 10 MG/ML IJ SOLN
INTRAMUSCULAR | Status: DC | PRN
  Administered 2020-10-13: 6 mg via INTRAVENOUS

## 2020-10-13 MED ORDER — LIDOCAINE 2% (20 MG/ML) 5 ML SYRINGE
INTRAMUSCULAR | Status: AC
  Filled 2020-10-13: qty 5

## 2020-10-13 MED ORDER — LACTATED RINGERS IV SOLN: INTRAVENOUS | Status: DC

## 2020-10-13 MED ORDER — POTASSIUM CHLORIDE 2 MEQ/ML IV SOLN
INTRAVENOUS | Status: DC
  Filled 2020-10-13 (×2): qty 1000

## 2020-10-13 MED ORDER — FENTANYL CITRATE (PF) 250 MCG/5ML IJ SOLN
INTRAMUSCULAR | Status: AC
  Filled 2020-10-13: qty 5

## 2020-10-13 MED ORDER — POTASSIUM CHLORIDE 2 MEQ/ML IV SOLN: INTRAVENOUS | Status: DC

## 2020-10-13 MED ORDER — FAMOTIDINE IN NACL 20-0.9 MG/50ML-% IV SOLN: 20.0000 mg | Freq: Two times a day (BID) | INTRAVENOUS | Status: DC

## 2020-10-13 MED ORDER — CHLORHEXIDINE GLUCONATE 0.12 % MT SOLN
15.0000 mL | Freq: Once | OROMUCOSAL | Status: AC
  Administered 2020-10-13: 15 mL via OROMUCOSAL

## 2020-10-13 SURGICAL SUPPLY — 53 items
ADH SKN CLS APL DERMABOND .7 (GAUZE/BANDAGES/DRESSINGS) ×1
APPLIER CLIP 5 13 M/L LIGAMAX5 (MISCELLANEOUS)
APR CLP MED LRG 5 ANG JAW (MISCELLANEOUS)
BLADE SURG 15 STRL LF DISP TIS (BLADE) ×1 IMPLANT
BLADE SURG 15 STRL SS (BLADE) ×2
CLIP APPLIE 5 13 M/L LIGAMAX5 (MISCELLANEOUS) IMPLANT
CLIP LIGATING HEMO O LOK GREEN (MISCELLANEOUS) ×2 IMPLANT
CLIP VESOLOCK LG 6/CT PURPLE (CLIP) ×1 IMPLANT
COVER SURGICAL LIGHT HANDLE (MISCELLANEOUS) ×2 IMPLANT
COVER TIP SHEARS 8 DVNC (MISCELLANEOUS) ×1 IMPLANT
COVER TIP SHEARS 8MM DA VINCI (MISCELLANEOUS) ×2
DECANTER SPIKE VIAL GLASS SM (MISCELLANEOUS) ×1 IMPLANT
DERMABOND ADVANCED (GAUZE/BANDAGES/DRESSINGS) ×1
DERMABOND ADVANCED .7 DNX12 (GAUZE/BANDAGES/DRESSINGS) ×1 IMPLANT
DEVICE TROCAR PUNCTURE CLOSURE (ENDOMECHANICALS) IMPLANT
DRAPE ARM DVNC X/XI (DISPOSABLE) ×4 IMPLANT
DRAPE COLUMN DVNC XI (DISPOSABLE) ×1 IMPLANT
DRAPE DA VINCI XI ARM (DISPOSABLE) ×8
DRAPE DA VINCI XI COLUMN (DISPOSABLE) ×2
ELECT REM PT RETURN 15FT ADLT (MISCELLANEOUS) ×2 IMPLANT
GLOVE SURG ENC TEXT LTX SZ8 (GLOVE) ×5 IMPLANT
GOWN STRL REUS W/TWL XL LVL3 (GOWN DISPOSABLE) ×8 IMPLANT
IRRIG SUCT STRYKERFLOW 2 WTIP (MISCELLANEOUS) ×2
IRRIGATION SUCT STRKRFLW 2 WTP (MISCELLANEOUS) IMPLANT
KIT BASIN OR (CUSTOM PROCEDURE TRAY) ×2 IMPLANT
KIT TURNOVER KIT A (KITS) ×2 IMPLANT
MANIFOLD NEPTUNE II (INSTRUMENTS) ×1 IMPLANT
MARKER SKIN DUAL TIP RULER LAB (MISCELLANEOUS) ×2 IMPLANT
NEEDLE HYPO 22GX1.5 SAFETY (NEEDLE) ×2 IMPLANT
OBTURATOR OPTICAL STANDARD 8MM (TROCAR) ×2
OBTURATOR OPTICAL STND 8 DVNC (TROCAR) ×1
OBTURATOR OPTICALSTD 8 DVNC (TROCAR) ×1 IMPLANT
PACK CARDIOVASCULAR III (CUSTOM PROCEDURE TRAY) ×2 IMPLANT
PAD POSITIONING PINK XL (MISCELLANEOUS) ×1 IMPLANT
SCISSORS LAP 5X35 DISP (ENDOMECHANICALS) IMPLANT
SEAL CANN UNIV 5-8 DVNC XI (MISCELLANEOUS) ×4 IMPLANT
SEAL XI 5MM-8MM UNIVERSAL (MISCELLANEOUS) ×8
SEALER VESSEL DA VINCI XI (MISCELLANEOUS)
SEALER VESSEL EXT DVNC XI (MISCELLANEOUS) ×1 IMPLANT
SLEEVE XCEL OPT CAN 5 100 (ENDOMECHANICALS) IMPLANT
SOL ANTI FOG 6CC (MISCELLANEOUS) ×1 IMPLANT
SOLUTION ANTI FOG 6CC (MISCELLANEOUS) ×1
SOLUTION ELECTROLUBE (MISCELLANEOUS) ×2 IMPLANT
SPONGE T-LAP 18X18 ~~LOC~~+RFID (SPONGE) ×2 IMPLANT
STAPLER CANNULA SEAL DVNC XI (STAPLE) IMPLANT
STAPLER CANNULA SEAL XI (STAPLE) ×2
SUT MNCRL AB 4-0 PS2 18 (SUTURE) ×4 IMPLANT
SUT VICRYL 0 TIES 12 18 (SUTURE) IMPLANT
SYR 20ML LL LF (SYRINGE) ×2 IMPLANT
TOWEL OR 17X26 10 PK STRL BLUE (TOWEL DISPOSABLE) ×2 IMPLANT
TOWEL OR NON WOVEN STRL DISP B (DISPOSABLE) IMPLANT
TROCAR BLADELESS OPT 5 100 (ENDOMECHANICALS) ×2 IMPLANT
TUBING INSUFFLATION 10FT LAP (TUBING) ×2 IMPLANT

## 2020-10-13 NOTE — ED Provider Notes (Signed)
[place] DEPT Provider Note   CSN: [id number] Arrival date & time: 10/13/20  0505     History Chief Complaint  Patient presents with   Abdominal Pain    Emily Saunders is a 45 y.o. female with history of GERD, PID, vitamin D deficiency who presents emergency department with a chief complaint of abdominal pain.  The patient reports sudden onset, constant, worsening right upper quadrant abdominal pain that awoke her from sleep just prior to arrival.  Pain is now radiating into her back.  No known aggravating or alleviating factors.  She attempted to have a bowel movement to see if it would improve her symptoms, but was unsuccessful.  She had 1 episode of vomiting prior to arrival.  She reports associated nausea.  No fever, chills, chest pain, shortness of breath, dysuria, hematuria, vaginal bleeding or discharge.  No other treatment prior to arrival.  Surgical history includes hysterectomy.  Ports that she ate ribs for dinner.  The history is provided by the patient and medical records. No language interpreter was used.      Past Medical History:  Diagnosis Date   GERD (gastroesophageal reflux disease)    H/O pilonidal cyst 2002    History of hiatal hernia    Hypertension    PID (acute pelvic inflammatory disease) 2006    Pneumonia 2016   Vitamin D deficiency 2015    Wears dentures    FULL SET    Patient Active Problem List   Diagnosis Date Noted   Pain in left knee 09/11/2019   HTN (hypertension) 02/27/2015   Unintended weight loss 10/27/2014   Allergic rhinitis 06/23/2014   Vitamin D insufficiency 03/03/2014   Pilonidal cyst 03/03/2014   Broken teeth 03/03/2014   Poor sleep pattern 03/03/2014   Obesity (BMI 30-39.9) 03/03/2014   Former smoker 03/03/2014   History of anemia 05/27/2013    Past Surgical History:  Procedure Laterality Date   ANTERIOR AND POSTERIOR REPAIR N/A 06/08/2020   Procedure: ANTERIOR (CYSTOCELE) AND POSTERIOR  REPAIR (RECTOCELE) with perineorrhaphy;  Surgeon: Jaquita Folds, MD;  Location: [place];  Service: Gynecology;  Laterality: N/A;   CESAREAN SECTION     X 1   CYST EXCISION Right 2003   hand   CYSTOSCOPY N/A 06/08/2020   Procedure: CYSTOSCOPY;  Surgeon: Jaquita Folds, MD;  Location: [facility];  Service: Gynecology;  Laterality: N/A;   NASAL SINUS SURGERY Bilateral 02/10/2020   Procedure: ENDOSCOPIC SINUS SURGERY Maxillary Anstrostomy with Tissue Removal;  Surgeon: Izora Gala, MD;  Location: [place];  Service: ENT;  Laterality: Bilateral;   VAGINAL HYSTERECTOMY N/A 06/08/2020   Procedure: HYSTERECTOMY VAGINAL with bilateral salpingectomy;  Surgeon: Jaquita Folds, MD;  Location: [facility];  Service: Gynecology;  Laterality: N/A;   VAGINAL PROLAPSE REPAIR N/A 06/08/2020   Procedure: UTEROSACRAL SUSPENSION;  Surgeon: Jaquita Folds, MD;  Location: [facility];  Service: Gynecology;  Laterality: N/A;     OB History     Gravida  6   Para  4   Term  4   Preterm      AB  2   Living  4      SAB  1   IAB      Ectopic  1   Multiple  0   Live Births  4           Family History  Problem Relation Age  of Onset   Hypertension Mother    Diabetes Mother    Asthma Mother    COPD Mother    Depression Mother    32 / [place] Mother    Vision loss Mother    Heart disease Mother    Parkinson's disease Father    Diabetes Maternal Aunt    Diabetes Maternal Uncle    Diabetes Cousin    Breast cancer Maternal Grandmother    Leukemia Sister     Social History   Tobacco Use   Smoking status: Former    Packs/day: 0.50    Years: 20.00    Pack years: 10.00    Types: Cigarettes   Smokeless tobacco: Never   Tobacco comments:    Does vape with nicotine  Vaping Use   Vaping Use: Never used  Substance Use Topics   Alcohol use: No    Alcohol/week: 0.0  standard drinks   Drug use: No    Home Medications Prior to Admission medications   Medication Sig Start Date End Date Taking? Authorizing Provider  acetaminophen (TYLENOL) 500 MG tablet Take 1 tablet (500 mg total) by mouth every 6 (six) hours as needed (pain). 06/04/20   Jaquita Folds, MD  amLODipine (NORVASC) 10 MG tablet Take 1 tablet (10 mg total) by mouth daily. 02/22/19   Shelly Bombard, MD  Blood Pressure Monitoring (BLOOD PRESSURE KIT) DEVI 1 mL by Does not apply route 3 (three) times daily. 11/30/18   [name], NP  cetirizine (ZYRTEC) 10 MG tablet Take 10 mg by mouth at bedtime. 04/18/20   [provider]  hydrochlorothiazide (HYDRODIURIL) 25 MG tablet Take 1 tablet (25 mg total) by mouth daily. 02/22/19   Shelly Bombard, MD  lidocaine (XYLOCAINE) 5 % ointment Apply 1 application topically 3 (three) times daily. 06/22/20   Megan Salon, MD  montelukast (SINGULAIR) 10 MG tablet Take 10 mg by mouth daily.    [provider]  omeprazole (PRILOSEC) 40 MG capsule Take 40 mg by mouth daily. 01/07/20   [provider]  OZEMPIC, 0.25 OR 0.5 MG/DOSE, 2 MG/1.5ML SOPN TUESDAY 05/03/20   [provider]  polyethylene glycol powder (GLYCOLAX/MIRALAX) 17 GM/SCOOP powder Take 17 g by mouth daily. Drink 17g (1 scoop) dissolved in water per day. 06/04/20   Jaquita Folds, MD  Vitamin D, Ergocalciferol, (DRISDOL) 1.25 MG (50000 UNIT) CAPS capsule Take 1 capsule by mouth once a week. TUESDAYS 04/24/20   [provider]    Allergies    Tape  Review of Systems   Review of Systems  Constitutional:  Negative for activity change, chills, diaphoresis and fever.  HENT:  Negative for congestion and sore throat.   Respiratory:  Negative for cough, shortness of breath and wheezing.   Cardiovascular:  Negative for chest pain and palpitations.  Gastrointestinal:  Positive for abdominal pain, nausea and vomiting. Negative for blood in  stool, constipation, diarrhea and rectal pain.  Genitourinary:  Negative for dyspareunia, dysuria, enuresis, flank pain, frequency, hematuria, menstrual problem, urgency, vaginal bleeding and vaginal pain.  Musculoskeletal:  Negative for back pain, joint swelling, myalgias, neck pain and neck stiffness.  Skin:  Negative for rash.  Allergic/Immunologic: Negative for immunocompromised state.  Neurological:  Negative for syncope, weakness, numbness and headaches.  Psychiatric/Behavioral:  Negative for confusion.    Physical Exam Updated Vital Signs BP (!) 92/51   Pulse (!) 59   Temp 97.8 F (36.6 C) (Oral)   Resp 18  Ht 5' 6" (1.676 m)   Wt 93.4 kg   SpO2 94%   BMI 33.25 kg/m   Physical Exam Vitals and nursing note reviewed.  Constitutional:      General: She is not in acute distress.    Appearance: She is not ill-appearing, toxic-appearing or diaphoretic.  HENT:     Head: Normocephalic.  Eyes:     Conjunctiva/sclera: Conjunctivae normal.  Cardiovascular:     Rate and Rhythm: Normal rate and regular rhythm.     Heart sounds: No murmur heard.   No friction rub. No gallop.  Pulmonary:     Effort: Pulmonary effort is normal. No respiratory distress.     Breath sounds: No stridor. No wheezing, rhonchi or rales.  Chest:     Chest wall: No tenderness.  Abdominal:     General: There is no distension.     Palpations: Abdomen is soft. There is no mass.     Tenderness: There is abdominal tenderness. There is right CVA tenderness and guarding. There is no left CVA tenderness or rebound.     Hernia: No hernia is present.     Comments: Tender to palpation in the right upper quadrant with a positive Murphy sign.  She appears uncomfortable.  She has right CVA tenderness, but a left.  Abdomen is soft and nondistended.  There is voluntary guarding, but no rebound.  Musculoskeletal:     Cervical back: Neck supple.  Skin:    General: Skin is warm.     Findings: No rash.  Neurological:      Mental Status: She is alert.  Psychiatric:        Behavior: Behavior normal.    ED Results / Procedures / Treatments   Labs (all labs ordered are listed, but only abnormal results are displayed) Labs Reviewed  COMPREHENSIVE METABOLIC PANEL - Abnormal; Notable for the following components:      Result Value   Glucose, Bld 111 (*)    Calcium 8.8 (*)    Alkaline Phosphatase 34 (*)    All other components within normal limits  LIPASE, BLOOD  CBC WITH DIFFERENTIAL/PLATELET  URINALYSIS, ROUTINE W REFLEX MICROSCOPIC  I-STAT BETA HCG BLOOD, ED ([facility], [facility], AP ONLY)    EKG None  Radiology US Abdomen Limited RUQ (LIVER/GB)  Result Date: 10/13/2020 CLINICAL DATA:  45 year old female with right upper quadrant abdominal pain. EXAM: ULTRASOUND ABDOMEN LIMITED RIGHT UPPER QUADRANT COMPARISON:  None. FINDINGS: Gallbladder: Multiple shadowing gallstones, individually estimated at 21 mm diameter (image 5). Gallbladder wall thickness remains normal. No pericholecystic fluid. However, a positive sonographic Percell Miller sign is reported. Common bile duct: Diameter: 4 mm, normal. Liver: No focal lesion identified. Within normal limits in parenchymal echogenicity (image 35). Portal vein is patent on color Doppler imaging with normal direction of blood flow towards the liver. Other: Negative visible right kidney. IMPRESSION: Multiple gallstones, with positive sonographic Murphy sign raising the possibility of acute cholecystitis although gallbladder wall thickness appears to remain normal. Electronically Signed   By: Genevie Ann M.D.   On: 10/13/2020 06:33    Procedures Procedures   Medications Ordered in ED Medications  HYDROmorphone (DILAUDID) injection 0.5 mg (0.5 mg Intravenous Given 10/13/20 0546)  ondansetron (ZOFRAN) injection 4 mg (4 mg Intravenous Given 10/13/20 0546)  sodium chloride 0.9 % bolus 1,000 mL (1,000 mLs Intravenous New Bag/Given 10/13/20 0546)    ED Course  I have reviewed the triage vital signs  and the nursing notes.  Pertinent labs & imaging  results that were available during my care of the patient were reviewed by me and considered in my medical decision making (see chart for details).    MDM Rules/Calculators/A&P                            45 year old female with history of GERD, PID, vitamin D deficiency who presents the emergency department with right upper quadrant pain, nausea, and vomiting x1 that began just prior to arrival.  The pain awoke her from sleep.  Vital signs are stable.  On exam, there is concern for cholecystitis as she has a positive Murphy sign and right upper quadrant tenderness.  We will give pain medication and Zofran.  Labs and right upper quadrant ultrasound have been ordered.  Labs have been reviewed and independently interpreted by me.  No leukocytosis.  Transaminases are normal.  No acute lab abnormalities.  Patient care transferred to PA Upstill at the end of my shift to follow-up on right upper quadrant ultrasound. Patient presentation, ED course, and plan of care discussed with review of all pertinent labs and imaging. Please see his/her note for further details regarding further ED course and disposition.  Final Clinical Impression(s) / ED Diagnoses Final diagnoses:  RUQ abdominal pain    Rx / DC Orders ED Discharge Orders     None        Joanne Gavel, PA-C 10/13/20 0655    Molpus, Jenny Reichmann, MD 10/13/20 [phone]

## 2020-10-13 NOTE — Interval H&P Note (Signed)
History and Physical Interval Note:  10/13/2020 11:05 AM  Emily Saunders  has presented today for surgery, with the diagnosis of GALLSTONES.  The various methods of treatment have been discussed with the patient and family. After consideration of risks, benefits and other options for treatment, the patient has consented to  Procedure(s): XI ROBOTIC [place] (N/A) as a surgical intervention.  The patient's history has been reviewed, patient examined, no change in status, stable for surgery.  I have reviewed the patient's chart and labs.  Questions were answered to the patient's satisfaction.     [name]

## 2020-10-13 NOTE — Discharge Instructions (Signed)
[facility] LAPAROSCOPIC or ROBOTIC SURGERY: POST OP INSTRUCTIONS Always review your discharge instruction sheet given to you by the facility where your surgery was performed. IF YOU HAVE DISABILITY OR FAMILY LEAVE FORMS, YOU MUST BRING THEM TO THE OFFICE FOR PROCESSING.   DO NOT GIVE THEM TO YOUR DOCTOR.  PAIN CONTROL  First take acetaminophen (Tylenol) AND/or ibuprofen (Advil) to control your pain after surgery.  Follow directions on package.  Taking acetaminophen (Tylenol) and/or ibuprofen (Advil) regularly after surgery will help to control your pain and lower the amount of prescription pain medication you may need.  You should not take more than 3,000 mg (3 grams) of acetaminophen (Tylenol) in 24 hours.  You should not take ibuprofen (Advil), aleve, motrin, naprosyn or other NSAIDS if you have a history of stomach ulcers or chronic kidney disease.  A prescription for pain medication may be given to you upon discharge.  Take your pain medication as prescribed, if you still have uncontrolled pain after taking acetaminophen (Tylenol) or ibuprofen (Advil). Use ice packs to help control pain. If you need a refill on your pain medication, please contact your pharmacy.  They will contact our office to request authorization. Prescriptions will not be filled after 5pm or on week-ends.  HOME MEDICATIONS Take your usually prescribed medications unless otherwise directed.  DIET You should follow a light diet the first few days after arrival home.  Be sure to include lots of fluids daily. Avoid fatty, fried foods.   CONSTIPATION It is common to experience some constipation after surgery and if you are taking pain medication.  Increasing fluid intake and taking a stool softener (such as Colace) will usually help or prevent this problem from occurring.  A mild laxative (Milk of Magnesia or Miralax) should be taken according to package instructions if there are no bowel movements  after 48 hours.  WOUND/INCISION CARE Most patients will experience some swelling and bruising in the area of the incisions.  Ice packs will help.  Swelling and bruising can take several days to resolve.  Unless discharge instructions indicate otherwise, follow guidelines below  STERI-STRIPS - you may remove your outer bandages 48 hours after surgery, and you may shower at that time.  You have steri-strips (small skin tapes) in place directly over the incision.  These strips should be left on the skin for 7-10 days.   DERMABOND/SKIN GLUE - you may shower in 24 hours.  The glue will flake off over the next 2-3 weeks. Any sutures or staples will be removed at the office during your follow-up visit.  ACTIVITIES You may resume regular (light) daily activities beginning the next day--such as daily self-care, walking, climbing stairs--gradually increasing activities as tolerated.  You may have sexual intercourse when it is comfortable.  Refrain from any heavy lifting or straining until approved by your doctor. You may drive when you are no longer taking prescription pain medication, you can comfortably wear a seatbelt, and you can safely maneuver your car and apply brakes.  FOLLOW-UP You should see your doctor in the office for a follow-up appointment approximately 2-3 weeks after your surgery.  You should have been given your post-op/follow-up appointment when your surgery was scheduled.  If you did not receive a post-op/follow-up appointment, make sure that you call for this appointment within a day or two after you arrive home to insure a convenient appointment time.   WHEN TO CALL YOUR DOCTOR: Fever over 101.0 Inability to urinate Continued bleeding  from incision. Increased pain, redness, or drainage from the incision. Increasing abdominal pain  The clinic staff is available to answer your questions during regular business hours.  Please don't hesitate to call and ask to speak to one of the  nurses for clinical concerns.  If you have a medical emergency, go to the nearest emergency room or call 911.  A surgeon from [facility] is always on call at the hospital. [address] [place], [place], [place]  [address] ? [address], [place], [place]   [address] [phone] ? [phone] ? FAX [phone] Web site: [email or web address]      Managing Your Pain After Surgery Without Opioids    Thank you for participating in our program to help patients manage their pain after surgery without opioids. This is part of our effort to provide you with the best care possible, without exposing you or your family to the risk that opioids pose.  What pain can I expect after surgery? You can expect to have some pain after surgery. This is normal. The pain is typically worse the day after surgery, and quickly begins to get better. Many studies have found that many patients are able to manage their pain after surgery with Over-the-Counter (OTC) medications such as Tylenol and Motrin. If you have a condition that does not allow you to take Tylenol or Motrin, notify your surgical team.  How will I manage my pain? The best strategy for controlling your pain after surgery is around the clock pain control with Tylenol (acetaminophen) and Motrin (ibuprofen or Advil). Alternating these medications with each other allows you to maximize your pain control. In addition to Tylenol and Motrin, you can use heating pads or ice packs on your incisions to help reduce your pain.  How will I alternate your regular strength over-the-counter pain medication? You will take a dose of pain medication every three hours. Start by taking 650 mg of Tylenol (2 pills of 325 mg) 3 hours later take 600 mg of Motrin (3 pills of 200 mg) 3 hours after taking the Motrin take 650 mg of Tylenol 3 hours after that take 600 mg of Motrin.   - 1 -  See example - if your first dose of Tylenol is at  12:00 PM   12:00 PM Tylenol 650 mg (2 pills of 325 mg)  3:00 PM Motrin 600 mg (3 pills of 200 mg)  6:00 PM Tylenol 650 mg (2 pills of 325 mg)  9:00 PM Motrin 600 mg (3 pills of 200 mg)  Continue alternating every 3 hours   We recommend that you follow this schedule around-the-clock for at least 3 days after surgery, or until you feel that it is no longer needed. Use the table on the last page of this handout to keep track of the medications you are taking. Important: Do not take more than '3000mg'$  of Tylenol or '3200mg'$  of Motrin in a 24-hour period. Do not take ibuprofen/Motrin if you have a history of bleeding stomach ulcers, severe kidney disease, &/or actively taking a blood thinner  What if I still have pain? If you have pain that is not controlled with the over-the-counter pain medications (Tylenol and Motrin or Advil) you might have what we call "breakthrough" pain. You will receive a prescription for a small amount of an opioid pain medication such as Oxycodone, Tramadol, or Tylenol with Codeine. Use these opioid pills in the first 24 hours after surgery if you have breakthrough  pain. Do not take more than 1 pill every 4-6 hours.  If you still have uncontrolled pain after using all opioid pills, don't hesitate to call our staff using the number provided. We will help make sure you are managing your pain in the best way possible, and if necessary, we can provide a prescription for additional pain medication.   Day 1    Time  Name of Medication Number of pills taken  Amount of Acetaminophen  Pain Level   Comments  AM PM       AM PM       AM PM       AM PM       AM PM       AM PM       AM PM       AM PM       Total Daily amount of Acetaminophen Do not take more than  3,000 mg per day      Day 2    Time  Name of Medication Number of pills taken  Amount of Acetaminophen  Pain Level   Comments  AM PM       AM PM       AM PM       AM PM       AM PM       AM PM        AM PM       AM PM       Total Daily amount of Acetaminophen Do not take more than  3,000 mg per day      Day 3    Time  Name of Medication Number of pills taken  Amount of Acetaminophen  Pain Level   Comments  AM PM       AM PM       AM PM       AM PM          AM PM       AM PM       AM PM       AM PM       Total Daily amount of Acetaminophen Do not take more than  3,000 mg per day      Day 4    Time  Name of Medication Number of pills taken  Amount of Acetaminophen  Pain Level   Comments  AM PM       AM PM       AM PM       AM PM       AM PM       AM PM       AM PM       AM PM       Total Daily amount of Acetaminophen Do not take more than  3,000 mg per day      Day 5    Time  Name of Medication Number of pills taken  Amount of Acetaminophen  Pain Level   Comments  AM PM       AM PM       AM PM       AM PM       AM PM       AM PM       AM PM       AM PM       Total Daily amount of Acetaminophen Do not take more  than  3,000 mg per day       Day 6    Time  Name of Medication Number of pills taken  Amount of Acetaminophen  Pain Level  Comments  AM PM       AM PM       AM PM       AM PM       AM PM       AM PM       AM PM       AM PM       Total Daily amount of Acetaminophen Do not take more than  3,000 mg per day      Day 7    Time  Name of Medication Number of pills taken  Amount of Acetaminophen  Pain Level   Comments  AM PM       AM PM       AM PM       AM PM       AM PM       AM PM       AM PM       AM PM       Total Daily amount of Acetaminophen Do not take more than  3,000 mg per day        For additional information about how and where to safely dispose of unused opioid medications - [employer]  Disclaimer: This document contains information and/or instructional materials adapted from [place] for the typical patient with your condition. It does not replace medical  advice from your health care provider because your experience may differ from that of the typical patient. Talk to your health care provider if you have any questions about this document, your condition or your treatment plan. Adapted from [place]

## 2020-10-13 NOTE — Op Note (Signed)
Samaa A Santizo  09-19-1975   10/13/2020    PCP:  [name], NP   Surgeon: Kaylyn Lim, MD, FACS  Asst:  Gurney Maxin, MD, FACS  Anes:  General anesthesia  Preop Dx: Acute cholecystitis Postop Dx: Acute cholecystitis with aberrant anatomy  Procedure: Robotic cholecystectomy with indocyanine green identification of cystic duct coming off right hepatic duct branch.  Cholecystectomy. Location Surgery: [facility] OR to Complications: None noted  EBL:   25 cc  Drains: None  Description of Procedure:  The patient was taken to OR 2.  After anesthesia was administered and the patient was prepped  with ChloraPrep and a timeout was performed.  Access was achieved through the left anterior axillary line with the Optiview technique.  This 5 mm port was used to then put in mother 3 mm trocar and then replaced with another 9 mm trocar.  The gallbladder was seen to be intrahepatic and markedly inflamed.  The robot was brought in and the camera was placed and targeted properly and the robot was docked.  Instrumentation were tip up and port 1 fenestrated bipolar and port to camera and port 3 and hook and port 4.  I then began the dissection and because the gallbladder was so tense I had to put in another 5 and through that we decompressed the gallbladder and then grasp it and elevated it.  However near the infundibular region it was very intrahepatic and there was a flap of liver that was completely covering [place] triangle.  This necessitated careful dissection with the hook and using mild fenestrated bipolar to elevate the flap and then the assistant retracting the infundibulum downward.  Tedious dissection ensued and with the indocyanine green and firefly I initially saw the common bile duct which was well away from the gallbladder and the liver was nicely lighted.  I then meticulously dissected this area and ultimately had the cystic duct and was tempted to dissected down near the common  duct and with the aid of the firefly it noted that the true cystic duct was actually coming off of a segmental hepatic duct.  Vessels were also noted were clipped with the robotic clips and ultimately the cystic duct was debulked and removed of adhesions to the point and with firefly identification anatomy I was able to clamp upon the gallbladder and put 2 clips on the cystic duct before dividing it.  Gallbladder was then removed from the gallbladder bed with a combination of hook electrocautery and gentle dissection.  We did not enter it.  Hemostasis was present.  No bile leaks were noted.  I then brought the gallbladder out in a bag through the left side port dilating a little bit and then closing it with a 0 Vicryl with the PMI.  Everything appeared to be in order.  I had previously done a tap block on both sides with 30 cc of Exparel.  Wounds were closed with 4-0 Monocryl and Dermabond.  The patient tolerated the procedure well and was taken to the PACU in stable condition.     Matt B. Hassell Done, [place], [facility], [place]

## 2020-10-13 NOTE — ED Provider Notes (Signed)
RUQ pain to back tonight with emesis Pending [place] r/o cholecystitis  6:45 - Introduction to patient signed out by Joline Maxcy, PA-C, being evaluation for RUQ abdominal pain, vomiting, that woke her from sleep early this morning around 2:30 am. No fever. No history of same.   Abdomen tender in RUQ. Labs reviewed and normal - no leukocytosis, no liver abnorms. US showing multiple gall stones with +Murphy's, no wall thickening. As she remains tender, will have surgery consult for further disposition.   7:20 - Surgery paged.   Discussed with Will Creig Hines, PA-C, surgery who advises patient will be taken to surgery this morning. COVID collected. Patient updated on plan and is agreeable to surgery    Charlann Lange, PA-C 10/13/20 0945    Molpus, Jenny Reichmann, MD 10/13/20 1004

## 2020-10-13 NOTE — Transfer of Care (Signed)
Immediate Anesthesia Transfer of Care Note  Patient: Emily Saunders  Procedure(s) Performed: XI ROBOTIC ASSISTED LAPAROSCOPIC CHOLECYSTECTOMY (Abdomen)  Patient Location: PACU  Anesthesia Type:General  Level of Consciousness: drowsy  Airway & Oxygen Therapy: Patient Spontanous Breathing and Patient connected to face mask oxygen  Post-op Assessment: Report given to RN and Post -op Vital signs reviewed and stable  Post vital signs: Reviewed and stable  Last Vitals:  Vitals Value Taken Time  BP 131/75 10/13/20 1530  Temp    Pulse 76 10/13/20 1531  Resp 21 10/13/20 1531  SpO2 99 % 10/13/20 1531  Vitals shown include unvalidated device data.  Last Pain:  Vitals:   10/13/20 1119  TempSrc:   PainSc: 5       Patients Stated Pain Goal: 4 ([id number])  Complications: No notable events documented.

## 2020-10-13 NOTE — H&P (Addendum)
[facility] Admission Note  Emily Saunders 18-Mar-1975  [id number].    Requesting MD: Joline Maxcy, [place] Chief Complaint: Abdominal pain going to the back. Reason for Consult: Cholelithiasis.  HPI:  Patient is a 45 year old female with a history of GERD, PID, vitamin D deficiency, most recently she had an I&D of an axillary abscess.  She presents with sudden onset of right upper quadrant pain that woke her from sleep deviating to her back.  Had 1 episode of vomiting prior to arrival along with nausea.  No fever or chills.  She has had this problem before but never this severely, she is always thought it was reflux or acid indigestion.  She said it hurt so much she could not lie down.   Work-up in the ED: She is afebrile, blood pressure was initially slightly diminished and her last blood pressure is 121/92.  Her other vital signs are stable.  Labs show glucose of 111 calcium 8.8, alk phos 34, lipase of 33, the remainder of the CMP is normal.  WBC 7.3, H/H12.3/38.3, platelets 206,000.  hCG is negative.  Urinalysis is negative, respiratory panel is pending, abdominal ultrasound shows multiple gallstones and the visually estimated 21 mm in diameter.  Gallbladder wall thickness was normal.  There is no pericholecystic fluid.  There was a positive Murphy sign.  Common bile duct is 4 mm.  We are asked to see.  ROS: Review of Systems  Constitutional: Negative.   HENT: Negative.    Eyes: Negative.   Respiratory: Negative.         She reports having COVID vaccinations.  Cardiovascular: Negative.   Gastrointestinal:  Positive for abdominal pain (Pains in the right upper quadrant going to her back.), constipation (Occasional, but not recently.), heartburn, nausea and vomiting. Negative for blood in stool, diarrhea and melena.       She had a colonoscopy this year with multiple polyps removed some she reports were precancerous.  Genitourinary: Negative.   Musculoskeletal: Negative.   Skin:  Negative.   Neurological: Negative.   Endo/Heme/Allergies: Negative.   Psychiatric/Behavioral: Negative.     Family History  Problem Relation Age of Onset   Hypertension Mother    Diabetes Mother    Asthma Mother    COPD Mother    Depression Mother    Miscarriages / [place] Mother    Vision loss Mother    Heart disease Mother    Parkinson's disease Father    Diabetes Maternal Aunt    Diabetes Maternal Uncle    Diabetes Cousin    Breast cancer Maternal Grandmother    Leukemia Sister     Past Medical History:  Diagnosis Date   GERD (gastroesophageal reflux disease)    H/O pilonidal cyst 2002    History of hiatal hernia    Hypertension    PID (acute pelvic inflammatory disease) 2006    Pneumonia 2016   Vitamin D deficiency 2015    Wears dentures    FULL SET    Past Surgical History:  Procedure Laterality Date   ANTERIOR AND POSTERIOR REPAIR N/A 06/08/2020   Procedure: ANTERIOR (CYSTOCELE) AND POSTERIOR REPAIR (RECTOCELE) with perineorrhaphy;  Surgeon: Jaquita Folds, MD;  Location: [facility];  Service: Gynecology;  Laterality: N/A;   CESAREAN SECTION     X 1   CYST EXCISION Right 2003   hand   CYSTOSCOPY N/A 06/08/2020   Procedure: CYSTOSCOPY;  Surgeon: Jaquita Folds, MD;  Location: [facility];  Service: Gynecology;  Laterality: N/A;   NASAL SINUS SURGERY Bilateral 02/10/2020   Procedure: ENDOSCOPIC SINUS SURGERY Maxillary Anstrostomy with Tissue Removal;  Surgeon: Izora Gala, MD;  Location: [facility];  Service: ENT;  Laterality: Bilateral;   VAGINAL HYSTERECTOMY N/A 06/08/2020   Procedure: HYSTERECTOMY VAGINAL with bilateral salpingectomy;  Surgeon: Jaquita Folds, MD;  Location: [facility];  Service: Gynecology;  Laterality: N/A;   VAGINAL PROLAPSE REPAIR N/A 06/08/2020   Procedure: UTEROSACRAL SUSPENSION;  Surgeon: Jaquita Folds, MD;  Location: [facility];   Service: Gynecology;  Laterality: N/A;    Social History:  reports that she has quit smoking. Her smoking use included cigarettes. She has a 10.00 pack-year smoking history. She has never used smokeless tobacco. She reports that she does not drink alcohol and does not use drugs. Tobacco: 20+ years/now vaping EtOH: None Drugs: None She lives with her son and grandchild. She works as a Camera operator. Allergies:  Allergies  Allergen Reactions   Tape     ADHESIVE SURGICAL TAPE BURNT SKIN    Prior to Admission medications   Medication Sig Start Date End Date Taking? Authorizing Provider  amLODipine (NORVASC) 10 MG tablet Take 1 tablet (10 mg total) by mouth daily. 02/22/19  Yes Shelly Bombard, MD  hydrochlorothiazide (HYDRODIURIL) 25 MG tablet Take 1 tablet (25 mg total) by mouth daily. 02/22/19  Yes Shelly Bombard, MD  Menthol, Topical Analgesic, (ICY HOT BACK EX) Apply 1 application topically as needed (shoulder pain).   Yes [provider]  montelukast (SINGULAIR) 10 MG tablet Take 10 mg by mouth daily.   Yes [provider]  omeprazole (PRILOSEC) 40 MG capsule Take 40 mg by mouth daily. 01/07/20  Yes [provider]  Vitamin D, Ergocalciferol, (DRISDOL) 1.25 MG (50000 UNIT) CAPS capsule Take 50,000 Units by mouth every 7 (seven) days. TUESDAYS 04/24/20  Yes [provider]  acetaminophen (TYLENOL) 500 MG tablet Take 1 tablet (500 mg total) by mouth every 6 (six) hours as needed (pain). Patient not taking: Reported on 10/13/2020 06/04/20   Jaquita Folds, MD  Blood Pressure Monitoring (BLOOD PRESSURE KIT) DEVI 1 mL by Does not apply route 3 (three) times daily. 11/30/18   [name], NP  lidocaine (XYLOCAINE) 5 % ointment Apply 1 application topically 3 (three) times daily. Patient not taking: Reported on 10/13/2020 06/22/20   Megan Salon, MD  polyethylene glycol powder [facility]) 17 GM/SCOOP powder Take 17 g by  mouth daily. Drink 17g (1 scoop) dissolved in water per day. Patient not taking: Reported on 10/13/2020 06/04/20   Jaquita Folds, MD     Blood pressure (!) 121/92, pulse 69, temperature 97.8 F (36.6 C), temperature source Oral, resp. rate 18, height _0  (1.676 m), weight 93.4 kg, SpO2 98 %. Physical Exam:  General: pleasant, WD, WN AA female who is laying in bed in NAD HEENT: head is normocephalic, atraumatic.  Sclera are noninjected.  Pupils are equal.   Ears and nose without any masses or lesions.  Mouth is pink and moist Heart: regular, rate, and rhythm.  Normal s1,s2. No obvious murmurs, gallops, or rubs noted.  Palpable radial and pedal pulses bilaterally Lungs: CTAB, no wheezes, rhonchi, or rales noted.  Respiratory effort nonlabored Abd: soft, she is tender in the right upper quadrant with pain going to her back.  Some minimal midepigastric tenderness., ND, +BS, no masses, hernias, or organomegaly MS: all  4 extremities are symmetrical with no cyanosis, clubbing, or edema. Skin: warm and dry with no masses, lesions, or rashes.  Multiple tattoos Neuro: Cranial nerves 2-12 grossly intact, sensation is normal throughout Psych: A&Ox3 with an appropriate affect.   Results for orders placed or performed during the hospital encounter of 10/13/20 (from the past 48 hour(s))  Lipase, blood     Status: None   Collection Time: 10/13/20  5:16 AM  Result Value Ref Range   Lipase 33 11 - 51 U/L    Comment: Performed at [facility], [place] [address] [place], [place] [address]  Comprehensive metabolic panel     Status: Abnormal   Collection Time: 10/13/20  5:16 AM  Result Value Ref Range   Sodium 140 135 - 145 mmol/L   Potassium 3.7 3.5 - 5.1 mmol/L   Chloride 107 98 - 111 mmol/L   CO2 26 22 - 32 mmol/L   Glucose, Bld 111 (H) 70 - 99 mg/dL    Comment: Glucose reference range applies only to samples taken after fasting for at least 8 hours.   BUN 13 6 - 20 mg/dL    Creatinine, Ser 0.89 0.44 - 1.00 mg/dL   Calcium 8.8 (L) 8.9 - 10.3 mg/dL   Total Protein 7.4 6.5 - 8.1 g/dL   Albumin 4.3 3.5 - 5.0 g/dL   AST 15 15 - 41 U/L   ALT 14 0 - 44 U/L   Alkaline Phosphatase 34 (L) 38 - 126 U/L   Total Bilirubin 0.4 0.3 - 1.2 mg/dL   GFR, Estimated >60 >60 mL/min    Comment: (NOTE) Calculated using the CKD-EPI Creatinine Equation (2021)    Anion gap 7 5 - 15    Comment: Performed at [facility], [place] [address] [place], [place] [address]  CBC with Differential     Status: None   Collection Time: 10/13/20  5:16 AM  Result Value Ref Range   WBC 7.3 4.0 - 10.5 K/uL   RBC 4.11 3.87 - 5.11 MIL/uL   Hemoglobin 12.3 12.0 - 15.0 g/dL   HCT 38.3 36.0 - 46.0 %   MCV 93.2 80.0 - 100.0 fL   MCH 29.9 26.0 - 34.0 pg   MCHC 32.1 30.0 - 36.0 g/dL   RDW 14.1 11.5 - 15.5 %   Platelets 206 150 - 400 K/uL   nRBC 0.0 0.0 - 0.2 %   Neutrophils Relative % 65 %   Neutro Abs 4.7 1.7 - 7.7 K/uL   Lymphocytes Relative 25 %   Lymphs Abs 1.9 0.7 - 4.0 K/uL   Monocytes Relative 6 %   Monocytes Absolute 0.4 0.1 - 1.0 K/uL   Eosinophils Relative 3 %   Eosinophils Absolute 0.2 0.0 - 0.5 K/uL   Basophils Relative 0 %   Basophils Absolute 0.0 0.0 - 0.1 K/uL   Immature Granulocytes 1 %   Abs Immature Granulocytes 0.05 0.00 - 0.07 K/uL    Comment: Performed at [facility], [place] [address], [place], [place] [address]  I-Stat beta hCG blood, ED     Status: None   Collection Time: 10/13/20  5:21 AM  Result Value Ref Range   I-stat hCG, quantitative <5.0 <5 mIU/mL   Comment 3            Comment:   GEST. AGE      CONC.  (mIU/mL)   <=1 WEEK        5 - 50  2 WEEKS       50 - 500     3 WEEKS       100 - 10,000     4 WEEKS     1,000 - 30,000        FEMALE AND NON-PREGNANT FEMALE:     LESS THAN 5 mIU/mL    US Abdomen Limited RUQ (LIVER/GB)  Result Date: 10/13/2020 CLINICAL DATA:  46 year old female with right upper quadrant abdominal pain.  EXAM: ULTRASOUND ABDOMEN LIMITED RIGHT UPPER QUADRANT COMPARISON:  None. FINDINGS: Gallbladder: Multiple shadowing gallstones, individually estimated at 21 mm diameter (image 5). Gallbladder wall thickness remains normal. No pericholecystic fluid. However, a positive sonographic Percell Miller sign is reported. Common bile duct: Diameter: 4 mm, normal. Liver: No focal lesion identified. Within normal limits in parenchymal echogenicity (image 35). Portal vein is patent on color Doppler imaging with normal direction of blood flow towards the liver. Other: Negative visible right kidney. IMPRESSION: Multiple gallstones, with positive sonographic Murphy sign raising the possibility of acute cholecystitis although gallbladder wall thickness appears to remain normal. Electronically Signed   By: Genevie Ann M.D.   On: 10/13/2020 06:33      Assessment/Plan Abdominal pain going to her back. Symptomatic cholelithiasis.  FEN: N.p.o./IV fluids ID: Rocephin DVT: SCDs  Hx GERD Hx hypertension Hx PID Hx vitamin D deficiency Hx axillary abscess I&D 09/12/20  Plan: Patient has multiple gallstones, 1 measuring 2.1 cm.  Her labs are all normal.  Agree with a diagnosis of symptomatic cholelithiasis.  We recommended cholecystectomy, the risk and benefits were discussed in detail and patient is agreeable.  Her last p.o. intake was last evening.  Her COVID is pending.  Dr. Hassell Done will see her in the holding area.  Earnstine [facility] 10/13/2020, 9:00 AM Please see Amion for pager number during day hours 7:00am-4:30pm

## 2020-10-13 NOTE — Anesthesia Preprocedure Evaluation (Signed)
Anesthesia Evaluation  Patient identified by MRN, date of birth, ID band Patient awake    Reviewed: Allergy & Precautions, NPO status , Patient's Chart, lab work & pertinent test results  Airway Mallampati: I  TM Distance: >3 FB     Dental  (+) Edentulous Upper, Upper Dentures, Partial Lower, Missing,    Pulmonary pneumonia, resolved, Current Smoker and Patient abstained from smoking., former smoker,    breath sounds clear to auscultation       Cardiovascular hypertension, Pt. on medications  Rhythm:Regular Rate:Normal     Neuro/Psych    GI/Hepatic hiatal hernia, GERD  Medicated and Controlled,  Endo/Other  Morbid obesity  Renal/GU      Musculoskeletal   Abdominal (+) + obese,   Peds  Hematology  (+) Blood dyscrasia, anemia ,   Anesthesia Other Findings   Reproductive/Obstetrics                             Anesthesia Physical  Anesthesia Plan  ASA: 2  Anesthesia Plan: General   Post-op Pain Management:    Induction: Intravenous  PONV Risk Score and Plan: 2 and Ondansetron and Dexamethasone  Airway Management Planned: Oral ETT and LMA  Additional Equipment: None  Intra-op Plan:   Post-operative Plan: Extubation in OR  Informed Consent: I have reviewed the patients History and Physical, chart, labs and discussed the procedure including the risks, benefits and alternatives for the proposed anesthesia with the patient or authorized representative who has indicated his/her understanding and acceptance.     Dental advisory given  Plan Discussed with: CRNA and Anesthesiologist  Anesthesia Plan Comments:         Anesthesia Quick Evaluation

## 2020-10-13 NOTE — Interval H&P Note (Signed)
History and Physical Interval Note:  10/13/2020 11:06 AM  Emily Saunders  has presented today for surgery, with the diagnosis of GALLSTONES.  The various methods of treatment have been discussed with the patient and family. After consideration of risks, benefits and other options for treatment, the patient has consented to  Procedure(s): XI ROBOTIC [place] (N/A) as a surgical intervention.  The patient's history has been reviewed, patient examined, no change in status, stable for surgery.  I have reviewed the patient's chart and labs.  Questions were answered to the patient's satisfaction.     [name]

## 2020-10-13 NOTE — Anesthesia Procedure Notes (Signed)
Procedure Name: Intubation Date/Time: 10/13/2020 12:12 PM Performed by: Maxwell Caul, CRNA Pre-anesthesia Checklist: Patient identified, Emergency Drugs available, Suction available and Patient being monitored Patient Re-evaluated:Patient Re-evaluated prior to induction Oxygen Delivery Method: Circle system utilized Preoxygenation: Pre-oxygenation with 100% oxygen Induction Type: IV induction Ventilation: Mask ventilation without difficulty Laryngoscope Size: Mac and 4 Grade View: Grade I Tube type: Oral Tube size: 7.5 mm Number of attempts: 1 Airway Equipment and Method: Stylet Placement Confirmation: ETT inserted through vocal cords under direct vision, positive ETCO2 and breath sounds checked- equal and bilateral Secured at: 21 cm Tube secured with: Tape Dental Injury: Teeth and Oropharynx as per pre-operative assessment

## 2020-10-13 NOTE — ED Triage Notes (Signed)
Pt reports sudden mid-upper abdominal pain that radiates around the right side of abdomen to the back. Pain woke her from her sleep at 0230. Denies n/v/d.

## 2020-10-13 NOTE — ED Notes (Signed)
Pt was able to ambulate with minimal assistance to bathroom and back to room.

## 2020-10-14 ENCOUNTER — Institutional Professional Consult (permissible substitution): Payer: Medicaid Other | Admitting: Plastic Surgery

## 2020-10-14 LAB — HIV ANTIBODY (ROUTINE TESTING W REFLEX): HIV Screen 4th Generation wRfx: NONREACTIVE

## 2020-10-14 LAB — COMPREHENSIVE METABOLIC PANEL
ALT: 43 U/L (ref 0–44)
AST: 38 U/L (ref 15–41)
Albumin: 3.6 g/dL (ref 3.5–5.0)
Alkaline Phosphatase: 36 U/L — ABNORMAL LOW (ref 38–126)
Anion gap: 5 (ref 5–15)
BUN: 10 mg/dL (ref 6–20)
CO2: 25 mmol/L (ref 22–32)
Calcium: 9.2 mg/dL (ref 8.9–10.3)
Chloride: 111 mmol/L (ref 98–111)
Creatinine, Ser: 0.88 mg/dL (ref 0.44–1.00)
GFR, Estimated: >60 mL/min (ref >60)
Glucose, Bld: 135 mg/dL — ABNORMAL HIGH (ref 70–99)
Potassium: 4.3 mmol/L (ref 3.5–5.1)
Sodium: 141 mmol/L (ref 135–145)
Total Bilirubin: 0.4 mg/dL (ref 0.3–1.2)
Total Protein: 6.7 g/dL (ref 6.5–8.1)

## 2020-10-14 LAB — SURGICAL PATHOLOGY

## 2020-10-14 MED ORDER — SIMETHICONE 80 MG PO CHEW
80.0000 mg | CHEWABLE_TABLET | Freq: Four times a day (QID) | ORAL | Status: DC
  Administered 2020-10-14 – 2020-10-15 (×5): 80 mg via ORAL
  Filled 2020-10-14 (×5): qty 1

## 2020-10-14 MED ORDER — HYDROCODONE-ACETAMINOPHEN 5-325 MG PO TABS
1.0000 | ORAL_TABLET | ORAL | Status: DC | PRN
  Administered 2020-10-14 – 2020-10-15 (×4): 2 via ORAL
  Filled 2020-10-14 (×4): qty 2

## 2020-10-14 MED ORDER — DIPHENHYDRAMINE HCL 25 MG PO CAPS
25.0000 mg | ORAL_CAPSULE | Freq: Once | ORAL | Status: AC
  Administered 2020-10-14: 25 mg via ORAL

## 2020-10-14 NOTE — Progress Notes (Signed)
Progress Note  1 Day Post-Op  Subjective: Patient reports headache this AM and abdominal pain. Reports abdominal pain is cramping and diffuse. No flatus or BM. She at some breakfast this AM and not having nausea but just doesn't feel well. Ambulated last night.   Objective: Vital signs in last 24 hours: Temp:  [97.8 F (36.6 C)-98.7 F (37.1 C)] 97.8 F (36.6 C) (09/07 0941) Pulse Rate:  [52-83] 52 (09/07 0941) Resp:  [13-18] 18 (09/07 0941) BP: (103-131)/(52-77) 115/70 (09/07 0941) SpO2:  [92 %-100 %] 96 % (09/07 0941) Last BM Date: 10/11/20  Intake/Output from previous day: 09/06 0701 - 09/07 0700 In: 2897.1 [P.O.:240; I.V.:2557.1; IV Piggyback:100] Out: [id number] [Urine:1700; Blood:50] Intake/Output this shift: Total I/O In: 392.5 [P.O.:240; I.V.:152.5] Out: 0   PE: General: WD, overweight female who is laying in bed in NAD Heart: regular, rate, and rhythm.   Lungs: Respiratory effort nonlabored Abd: soft, appropriately ttp, mild distention, BS hypoactive, incisions are clean and intact  MS: all 4 extremities are symmetrical with no cyanosis, clubbing, or edema. Skin: warm and dry with no masses, lesions, or rashes Psych: A&Ox3 with an appropriate affect.    Lab Results:  Recent Labs    10/13/20 0516  WBC 7.3  HGB 12.3  HCT 38.3  PLT 206   BMET Recent Labs    10/13/20 0516 10/14/20 0422  NA 140 141  K 3.7 4.3  CL 107 111  CO2 26 25  GLUCOSE 111* 135*  BUN 13 10  CREATININE 0.89 0.88  CALCIUM 8.8* 9.2   PT/INR No results for input(s): LABPROT, INR in the last 72 hours. CMP     Component Value Date/Time   NA 141 10/14/2020 0422   NA 144 11/26/2018 1428   K 4.3 10/14/2020 0422   CL 111 10/14/2020 0422   CO2 25 10/14/2020 0422   GLUCOSE 135 (H) 10/14/2020 0422   BUN 10 10/14/2020 0422   BUN 9 11/26/2018 1428   CREATININE 0.88 10/14/2020 0422   CREATININE 0.96 09/23/2014 1440   CALCIUM 9.2 10/14/2020 0422   PROT 6.7 10/14/2020 0422   PROT 6.8  11/26/2018 1428   ALBUMIN 3.6 10/14/2020 0422   ALBUMIN 4.6 11/26/2018 1428   AST 38 10/14/2020 0422   ALT 43 10/14/2020 0422   ALKPHOS 36 (L) 10/14/2020 0422   BILITOT 0.4 10/14/2020 0422   BILITOT <0.2 11/26/2018 1428   GFRNONAA >60 10/14/2020 0422   GFRNONAA 80 03/03/2014 1558   GFRAA 104 11/26/2018 1428   GFRAA >89 03/03/2014 1558   Lipase     Component Value Date/Time   LIPASE 33 10/13/2020 0516       Studies/Results: US Abdomen Limited RUQ (LIVER/GB)  Result Date: 10/13/2020 CLINICAL DATA:  45 year old female with right upper quadrant abdominal pain. EXAM: ULTRASOUND ABDOMEN LIMITED RIGHT UPPER QUADRANT COMPARISON:  None. FINDINGS: Gallbladder: Multiple shadowing gallstones, individually estimated at 21 mm diameter (image 5). Gallbladder wall thickness remains normal. No pericholecystic fluid. However, a positive sonographic Percell Miller sign is reported. Common bile duct: Diameter: 4 mm, normal. Liver: No focal lesion identified. Within normal limits in parenchymal echogenicity (image 35). Portal vein is patent on color Doppler imaging with normal direction of blood flow towards the liver. Other: Negative visible right kidney. IMPRESSION: Multiple gallstones, with positive sonographic Murphy sign raising the possibility of acute cholecystitis although gallbladder wall thickness appears to remain normal. Electronically Signed   By: Genevie Ann M.D.   On: 10/13/2020 06:33  Anti-infectives: Anti-infectives (From admission, onward)    Start     Dose/Rate Route Frequency Ordered Stop   10/13/20 1015  cefTRIAXone (ROCEPHIN) 2 g in sodium chloride 0.9 % 100 mL IVPB        2 g 200 mL/hr over 30 Minutes Intravenous On call to O.R. 10/13/20 1012 10/13/20 1245        Assessment/Plan Acute cholecystitis  POD1 S/P robotic cholecystectomy 10/13/20 Dr. Hassell Done  - patient complaining of headache and cramping gas like pains this AM - encourage ambulation and added simethicone  - continue diet  as tolerated - LFTs stable this AM, VSS - will recheck this afternoon for possible discharge  FEN: HH diet, SLIV VTE: LMWH ID: rocephin 9/6  Hx GERD Hx hypertension Hx PID Hx vitamin D deficiency Hx axillary abscess I&D 09/12/20  LOS: 0 days    Norm Parcel, [facility] 10/14/2020, 10:18 AM Please see Amion for pager number during day hours 7:00am-4:30pm

## 2020-10-15 ENCOUNTER — Encounter: Payer: Medicaid Other | Admitting: Physical Therapy

## 2020-10-15 MED ORDER — HYDROCODONE-ACETAMINOPHEN 5-325 MG PO TABS: 1.0000 | ORAL_TABLET | ORAL | 0 refills | Status: DC | PRN

## 2020-10-15 MED ORDER — SIMETHICONE 80 MG PO CHEW: 80.0000 mg | CHEWABLE_TABLET | Freq: Four times a day (QID) | ORAL | Status: DC | PRN

## 2020-10-15 MED ORDER — IBUPROFEN 600 MG PO TABS: 600.0000 mg | ORAL_TABLET | Freq: Three times a day (TID) | ORAL | 0 refills | Status: DC | PRN

## 2020-10-15 MED ORDER — FAMOTIDINE 20 MG PO TABS: 20.0000 mg | ORAL_TABLET | Freq: Every day | ORAL | Status: DC | PRN

## 2020-10-15 NOTE — Discharge Summary (Signed)
[facility] Discharge Summary   Patient ID: Emily Saunders MRN: [id number] DOB/AGE: [birth year] 45 y.o.  Admit date: 10/13/2020 Discharge date: 10/15/2020  Admitting Diagnosis: Symptomatic cholelithiasis  Discharge Diagnosis Acute cholecystitis S/p robotic cholecystectomy   Consultants None   Imaging: No results found.  Procedures Dr. Johnathan Hausen (10/13/20) - Robotic Cholecystectomy   Hospital Course:  Patient is a 45 year old female who presented to [facility] with abdominal pain and nausea/vomiting.  Workup showed cholelithiasis.  Patient was admitted and underwent procedure listed above. Intra-operatively patient felt to have acute cholecystitis. Tolerated procedure well and was transferred to the floor.  Diet was advanced as tolerated.  On POD2, the patient was voiding well, tolerating diet, ambulating well, pain well controlled, vital signs stable, incisions c/d/i and felt stable for discharge home.  Patient will follow up in our office in 3 weeks and knows to call with questions or concerns. She will call to confirm appointment date/time.    Physical Exam: General:  Alert, NAD, pleasant, comfortable Abd:  Soft, ND, mild tenderness, incisions C/D/I  I or a member of my team have reviewed this patient in the Controlled Substance Database.   Allergies as of 10/15/2020       Reactions   Tape    ADHESIVE SURGICAL TAPE BURNT SKIN        Medication List     TAKE these medications    acetaminophen 500 MG tablet Commonly known as: TYLENOL Take 1 tablet (500 mg total) by mouth every 6 (six) hours as needed (pain).   amLODipine 10 MG tablet Commonly known as: NORVASC Take 1 tablet (10 mg total) by mouth daily.   Blood Pressure Kit Devi 1 mL by Does not apply route 3 (three) times daily.   famotidine 20 MG tablet Commonly known as: PEPCID Take 1 tablet (20 mg total) by mouth daily as needed for heartburn or indigestion.   hydrochlorothiazide 25 MG  tablet Commonly known as: HYDRODIURIL Take 1 tablet (25 mg total) by mouth daily.   HYDROcodone-acetaminophen 5-325 MG tablet Commonly known as: NORCO/VICODIN Take 1-2 tablets by mouth every 4 (four) hours as needed for moderate pain or severe pain.   ibuprofen 600 MG tablet Commonly known as: ADVIL Take 1 tablet (600 mg total) by mouth every 8 (eight) hours as needed for mild pain or moderate pain.   ICY HOT BACK EX Apply 1 application topically as needed (shoulder pain).   lidocaine 5 % ointment Commonly known as: XYLOCAINE Apply 1 application topically 3 (three) times daily.   montelukast 10 MG tablet Commonly known as: SINGULAIR Take 10 mg by mouth daily.   omeprazole 40 MG capsule Commonly known as: PRILOSEC Take 40 mg by mouth daily.   polyethylene glycol powder 17 GM/SCOOP powder Commonly known as: GLYCOLAX/MIRALAX Take 17 g by mouth daily. Drink 17g (1 scoop) dissolved in water per day.   simethicone 80 MG chewable tablet Commonly known as: MYLICON Chew 1 tablet (80 mg total) by mouth every 6 (six) hours as needed for flatulence.   Vitamin D (Ergocalciferol) 1.25 MG (50000 UNIT) Caps capsule Commonly known as: DRISDOL Take 50,000 Units by mouth every 7 (seven) days. TUESDAYS          Follow-up Information     [facility] Follow up on 11/04/2020.   Specialty: General Surgery Why: Your appointment with Dr. Hassell Done is at 4:45 PM.  Be at the office 30 minutes early for check-in.  Bring photo ID and  insurance information. Contact information: [address] [place] [address] [phone]         Edwards, Michelle P, NP Follow up.   Specialty: Internal Medicine Why: Call and let them know you had your surgery and follow-up for medical issues. Contact information: [address] [place] [address] [phone]                 Signed: Kelly R Johnson , PA-C [facility] 10/15/2020, 8:55  AM Please see Amion for pager number during day hours 7:00am-4:30pm    

## 2020-10-15 NOTE — Anesthesia Postprocedure Evaluation (Signed)
Anesthesia Post Note  Patient: Melina A Kobashigawa  Procedure(s) Performed: XI ROBOTIC ASSISTED LAPAROSCOPIC CHOLECYSTECTOMY (Abdomen)     Patient location during evaluation: PACU Anesthesia Type: General Level of consciousness: awake and alert Pain management: pain level controlled Vital Signs Assessment: post-procedure vital signs reviewed and stable Respiratory status: spontaneous breathing, nonlabored ventilation, respiratory function stable and patient connected to nasal cannula oxygen Cardiovascular status: blood pressure returned to baseline and stable Postop Assessment: no apparent nausea or vomiting Anesthetic complications: no   No notable events documented.  Last Vitals:  Vitals:   10/14/20 2014 10/15/20 0543  BP: 115/64 (!) 100/49  Pulse: (!) 55 (!) 55  Resp: 18 18  Temp: 36.9 C 36.7 C  SpO2: 100% 96%    Last Pain:  Vitals:   10/15/20 0543  TempSrc: Oral  PainSc:    Pain Goal: Patients Stated Pain Goal: 4 (10/15/20 0253)                 Davan Nawabi

## 2020-10-15 NOTE — Progress Notes (Signed)
Reviewed written d/c instructions w pt and all questions answered. She verbalized understanding. D/C per w/c w all belongings in stable condition. 

## 2020-10-16 ENCOUNTER — Telehealth: Payer: Self-pay

## 2020-10-16 NOTE — Telephone Encounter (Signed)
Transition Care Management Follow-up Telephone Call   Date of discharge and from where:[facility] 10/15/2020 How have you been since you were released from the hospital? Pt stated she is Fine but a little sore Any questions or concerns? No questions/concerns reported.  Items Reviewed: Did the pt receive and understand the discharge instructions provided? Pt have the instructions and have no questions.  Medications obtained and verified? She said she have the medication list and the hospital staff reviewed them in detail prior to discharge.She said that she has all of the medications and they have no questions.  Any new allergies since your discharge? None reported  Do you have support at home? Yes, daughter Other (ie: DME, Home Health, etc)    None    Functional Questionnaire: (I = Independent and D = Dependent) ADL's:  Independent.        Follow up appointments reviewed:   PCP Hospital f/u appt confirmed? NP Juluis Mire on 11/02/2020@ 2:30 pm [facility]ist Hospital f/u appt confirmed? scheduled at this time on 10/29/2020 Are transportation arrangements needed? have transportation   If their condition worsens, is the pt aware to call  their PCP or go to the ED? Yes.Made pt aware if condition worsen or start experiencing rapid weight gain, chest pain, diff breathing, SOB, high fevers, or bleading to refer imediately to ED for further evaluation.  Was the patient provided with contact information for the PCP's office or ED? He has the phone number  Was the pt encouraged to call back with questions or concerns?yes

## 2020-10-16 NOTE — Telephone Encounter (Signed)
Transition Care Management Follow-up Telephone Call Date of discharge and from where: 10/15/2020-[facility]  How have you been since you were released from the hospital? Patient stated she is doing fine.  Any questions or concerns? No  Items Reviewed: Did the pt receive and understand the discharge instructions provided? Yes  Medications obtained and verified? Yes  Other? No  Any new allergies since your discharge? No  Dietary orders reviewed? N/A Do you have support at home? Yes   Home Care and Equipment/Supplies: Were home health services ordered? not applicable If so, what is the name of the agency? N/A  Has the agency set up a time to come to the patient's home? not applicable Were any new equipment or medical supplies ordered?  No What is the name of the medical supply agency? N/A Were you able to get the supplies/equipment? not applicable Do you have any questions related to the use of the equipment or supplies? No  Functional Questionnaire: (I = Independent and D = Dependent) ADLs: I  Bathing/Dressing- I  Meal Prep- I  Eating- I  Maintaining continence- I  Transferring/Ambulation- I  Managing Meds- I  Follow up appointments reviewed:  PCP Hospital f/u appt confirmed? No  . [facility] f/u appt confirmed? Yes  Scheduled to see Dr. Claudia Desanctis on 12/09/2020 @ 10:30 am. Are transportation arrangements needed? No  If their condition worsens, is the pt aware to call PCP or go to the Emergency Dept.? Yes Was the patient provided with contact information for the PCP's office or ED? Yes Was to pt encouraged to call back with questions or concerns? Yes

## 2020-10-24 DIAGNOSIS — R632 Polyphagia: Secondary | ICD-10-CM | POA: Diagnosis not present

## 2020-10-24 DIAGNOSIS — R635 Abnormal weight gain: Secondary | ICD-10-CM | POA: Diagnosis not present

## 2020-10-24 DIAGNOSIS — R7303 Prediabetes: Secondary | ICD-10-CM | POA: Diagnosis not present

## 2020-10-24 DIAGNOSIS — I10 Essential (primary) hypertension: Secondary | ICD-10-CM | POA: Diagnosis not present

## 2020-10-24 DIAGNOSIS — Z23 Encounter for immunization: Secondary | ICD-10-CM | POA: Diagnosis not present

## 2020-10-24 DIAGNOSIS — N926 Irregular menstruation, unspecified: Secondary | ICD-10-CM | POA: Diagnosis not present

## 2020-10-24 DIAGNOSIS — Z6834 Body mass index (BMI) 34.0-34.9, adult: Secondary | ICD-10-CM | POA: Diagnosis not present

## 2020-10-24 DIAGNOSIS — F17210 Nicotine dependence, cigarettes, uncomplicated: Secondary | ICD-10-CM | POA: Diagnosis not present

## 2020-10-24 DIAGNOSIS — E559 Vitamin D deficiency, unspecified: Secondary | ICD-10-CM | POA: Diagnosis not present

## 2020-10-27 ENCOUNTER — Encounter: Payer: Medicaid Other | Admitting: Physical Therapy

## 2020-10-29 ENCOUNTER — Other Ambulatory Visit: Payer: Medicaid Other

## 2020-10-29 ENCOUNTER — Inpatient Hospital Stay: Admission: RE | Admit: 2020-10-29 | Payer: Medicaid Other | Source: Ambulatory Visit

## 2020-11-02 ENCOUNTER — Ambulatory Visit (INDEPENDENT_AMBULATORY_CARE_PROVIDER_SITE_OTHER): Payer: Medicaid Other | Admitting: Primary Care

## 2020-11-03 ENCOUNTER — Encounter: Payer: Medicaid Other | Admitting: Physical Therapy

## 2020-11-10 ENCOUNTER — Encounter: Payer: Medicaid Other | Admitting: Physical Therapy

## 2020-11-21 DIAGNOSIS — F17210 Nicotine dependence, cigarettes, uncomplicated: Secondary | ICD-10-CM | POA: Diagnosis not present

## 2020-11-21 DIAGNOSIS — E781 Pure hyperglyceridemia: Secondary | ICD-10-CM | POA: Diagnosis not present

## 2020-11-21 DIAGNOSIS — R7303 Prediabetes: Secondary | ICD-10-CM | POA: Diagnosis not present

## 2020-11-21 DIAGNOSIS — R635 Abnormal weight gain: Secondary | ICD-10-CM | POA: Diagnosis not present

## 2020-11-21 DIAGNOSIS — R632 Polyphagia: Secondary | ICD-10-CM | POA: Diagnosis not present

## 2020-11-21 DIAGNOSIS — I10 Essential (primary) hypertension: Secondary | ICD-10-CM | POA: Diagnosis not present

## 2020-11-21 DIAGNOSIS — Z6834 Body mass index (BMI) 34.0-34.9, adult: Secondary | ICD-10-CM | POA: Diagnosis not present

## 2020-11-21 DIAGNOSIS — R7301 Impaired fasting glucose: Secondary | ICD-10-CM | POA: Diagnosis not present

## 2020-11-24 DIAGNOSIS — K219 Gastro-esophageal reflux disease without esophagitis: Secondary | ICD-10-CM | POA: Diagnosis not present

## 2020-11-24 DIAGNOSIS — R0989 Other specified symptoms and signs involving the circulatory and respiratory systems: Secondary | ICD-10-CM | POA: Diagnosis not present

## 2020-11-26 DIAGNOSIS — R0982 Postnasal drip: Secondary | ICD-10-CM | POA: Diagnosis not present

## 2020-11-26 DIAGNOSIS — J309 Allergic rhinitis, unspecified: Secondary | ICD-10-CM | POA: Diagnosis not present

## 2020-12-09 ENCOUNTER — Encounter: Payer: Self-pay | Admitting: Plastic Surgery

## 2020-12-09 ENCOUNTER — Other Ambulatory Visit: Payer: Self-pay

## 2020-12-09 ENCOUNTER — Ambulatory Visit: Payer: Medicaid Other | Admitting: Plastic Surgery

## 2020-12-09 VITALS — BP 118/82 | HR 73 | Ht 66.0 in | Wt 214.8 lb

## 2020-12-09 DIAGNOSIS — M4004 Postural kyphosis, thoracic region: Secondary | ICD-10-CM

## 2020-12-09 DIAGNOSIS — M546 Pain in thoracic spine: Secondary | ICD-10-CM

## 2020-12-09 DIAGNOSIS — M545 Low back pain, unspecified: Secondary | ICD-10-CM | POA: Diagnosis not present

## 2020-12-09 DIAGNOSIS — N62 Hypertrophy of breast: Secondary | ICD-10-CM | POA: Diagnosis not present

## 2020-12-09 NOTE — Progress Notes (Signed)
Referring Provider [name], NP [address] [place],  [place] [address]   CC:  Chief Complaint  Patient presents with   Advice Only      Emily Saunders is an 45 y.o. female.  HPI: Patient presents to discuss breast reduction.  She has had years of back pain, neck pain and shoulder grooving related to her large breast.  She tried over-the-counter medications, warm packs, cold packs and supportive bras with little relief.  She also gets rashes beneath her breasts have been refractory to over-the-counter treatments.  She is currently a J cup and wants to be a double D cup.  She does have a family history of breast cancer with her maternal grandmother and maternal aunt.  She has had no previous breast biopsies or procedures herself.  She is up-to-date on her mammograms and has had one this year.  She no longer smokes cigarettes but will vape occasionally.  No diabetes and no blood thinners.  She is interested in surgical treatment of her back pain through her breast reduction.  Allergies  Allergen Reactions   Tape     ADHESIVE SURGICAL TAPE BURNT SKIN    Outpatient Encounter Medications as of 12/09/2020  Medication Sig   amLODipine (NORVASC) 10 MG tablet Take 1 tablet (10 mg total) by mouth daily.   Blood Pressure Monitoring (BLOOD PRESSURE KIT) DEVI 1 mL by Does not apply route 3 (three) times daily.   hydrochlorothiazide (HYDRODIURIL) 25 MG tablet Take 1 tablet (25 mg total) by mouth daily.   montelukast (SINGULAIR) 10 MG tablet Take 10 mg by mouth daily.   omeprazole (PRILOSEC) 40 MG capsule Take 40 mg by mouth daily.   polyethylene glycol powder (GLYCOLAX/MIRALAX) 17 GM/SCOOP powder Take 17 g by mouth daily. Drink 17g (1 scoop) dissolved in water per day.   simethicone (MYLICON) 80 MG chewable tablet Chew 1 tablet (80 mg total) by mouth every 6 (six) hours as needed for flatulence.   Vitamin D, Ergocalciferol, (DRISDOL) 1.25 MG (50000 UNIT) CAPS capsule Take 50,000  Units by mouth every 7 (seven) days. TUESDAYS   [DISCONTINUED] acetaminophen (TYLENOL) 500 MG tablet Take 1 tablet (500 mg total) by mouth every 6 (six) hours as needed (pain). (Patient not taking: Reported on 12/09/2020)   [DISCONTINUED] famotidine (PEPCID) 20 MG tablet Take 1 tablet (20 mg total) by mouth daily as needed for heartburn or indigestion. (Patient not taking: Reported on 12/09/2020)   [DISCONTINUED] HYDROcodone-acetaminophen (NORCO/VICODIN) 5-325 MG tablet Take 1-2 tablets by mouth every 4 (four) hours as needed for moderate pain or severe pain. (Patient not taking: Reported on 12/09/2020)   [DISCONTINUED] ibuprofen (ADVIL) 600 MG tablet Take 1 tablet (600 mg total) by mouth every 8 (eight) hours as needed for mild pain or moderate pain. (Patient not taking: Reported on 12/09/2020)   [DISCONTINUED] lidocaine (XYLOCAINE) 5 % ointment Apply 1 application topically 3 (three) times daily. (Patient not taking: Reported on 12/09/2020)   [DISCONTINUED] Menthol, Topical Analgesic, (ICY HOT BACK EX) Apply 1 application topically as needed (shoulder pain). (Patient not taking: Reported on 12/09/2020)   Facility-Administered Encounter Medications as of 12/09/2020  Medication   estradiol (ESTRACE) vaginal cream 1 Applicatorful     Past Medical History:  Diagnosis Date   GERD (gastroesophageal reflux disease)    H/O pilonidal cyst 2002    History of hiatal hernia    Hypertension    PID (acute pelvic inflammatory disease) 2006    Pneumonia 2016   Vitamin  D deficiency 2015    Wears dentures    FULL SET    Past Surgical History:  Procedure Laterality Date   ANTERIOR AND POSTERIOR REPAIR N/A 06/08/2020   Procedure: ANTERIOR (CYSTOCELE) AND POSTERIOR REPAIR (RECTOCELE) with perineorrhaphy;  Surgeon: Jaquita Folds, MD;  Location: [facility];  Service: Gynecology;  Laterality: N/A;   CESAREAN SECTION     X 1   CYST EXCISION Right 2003   hand   CYSTOSCOPY N/A 06/08/2020    Procedure: CYSTOSCOPY;  Surgeon: Jaquita Folds, MD;  Location: [facility];  Service: Gynecology;  Laterality: N/A;   NASAL SINUS SURGERY Bilateral 02/10/2020   Procedure: ENDOSCOPIC SINUS SURGERY Maxillary Anstrostomy with Tissue Removal;  Surgeon: Izora Gala, MD;  Location: [facility];  Service: ENT;  Laterality: Bilateral;   VAGINAL HYSTERECTOMY N/A 06/08/2020   Procedure: HYSTERECTOMY VAGINAL with bilateral salpingectomy;  Surgeon: Jaquita Folds, MD;  Location: [facility];  Service: Gynecology;  Laterality: N/A;   VAGINAL PROLAPSE REPAIR N/A 06/08/2020   Procedure: UTEROSACRAL SUSPENSION;  Surgeon: Jaquita Folds, MD;  Location[facility];  Service: Gynecology;  Laterality: N/A;    Family History  Problem Relation Age of Onset   Hypertension Mother    Diabetes Mother    Asthma Mother    COPD Mother    Depression Mother    Miscarriages / [place] Mother    Vision loss Mother    Heart disease Mother    Parkinson's disease Father    Diabetes Maternal Aunt    Diabetes Maternal Uncle    Diabetes Cousin    Breast cancer Maternal Grandmother    Leukemia Sister     Social History   Social History Narrative   Works at [employer].   Up at 6 AM.    Gets 4 hs of sleep off and on.       Daughter and son live with her   Caffeine: maybe 1 cup/day   Right handed     Review of Systems General: Denies fevers, chills, weight loss CV: Denies chest pain, shortness of breath, palpitations  Physical Exam Vitals with BMI 12/09/2020 10/15/2020 10/15/2020  Height 5' 6"  - -  Weight 214 lbs 13 oz - -  BMI 97.02 - -  Systolic 637 858 850  Diastolic 82 78 49  Pulse 73 57 55    General:  No acute distress,  Alert and oriented, Non-Toxic, Normal speech and affect Breast: She has grade 3 ptosis.  Sternal notch to nipple is 40 cm on the right and 41 cm on the left.  Nipple to fold is 20 cm on the right 19 cm  on the left.  No obvious scars or masses.  Assessment/Plan The patient has bilateral symptomatic macromastia.  She is a good candidate for a breast reduction.  She is interested in pursuing surgical treatment.  She has tried supportive garments and fitted bras with no relief.  The details of breast reduction surgery were discussed.  I explained the procedure in detail along the with the expected scars.  The risks were discussed in detail and include bleeding, infection, damage to surrounding structures, need for additional procedures, nipple loss, change in nipple sensation, persistent pain, contour irregularities and asymmetries.  I explained that breast feeding is often not possible after breast reduction surgery.  We discussed the expected postoperative course with an overall recovery period of about 1 month.  She demonstrated full  understanding of all risks.  We discussed her personal risk factors that include the length of her breast.  We discussed the fact that the longer distances between the sternal notch to nipple and nipple to fold would limit to some extent the tissue that could be removed.  I explained the free nipple graft technique to her and she is not interested in that approach due to the definitive loss of nipple sensation.  I did explain that she would need to stop smoking nicotine products ahead of surgery and she felt that she could easily accommodate that.  The patient is interested in pursuing surgical treatment.  I anticipate approximately 900g of tissue removed from each side.   Cindra Presume 12/09/2020, 11:03 AM

## 2021-01-13 ENCOUNTER — Ambulatory Visit: Payer: Medicaid Other | Admitting: Orthopaedic Surgery

## 2021-01-20 NOTE — Therapy (Incomplete)
OUTPATIENT PHYSICAL THERAPY CERVICAL EVALUATION   Patient Name: Emily Saunders MRN: [id number] DOB:October 18, 1975, 45 y.o., female Today's Date: 01/20/2021    Past Medical History:  Diagnosis Date   GERD (gastroesophageal reflux disease)    H/O pilonidal cyst 2002    History of hiatal hernia    Hypertension    PID (acute pelvic inflammatory disease) 2006    Pneumonia 2016   Vitamin D deficiency 2015    Wears dentures    FULL SET   Past Surgical History:  Procedure Laterality Date   ANTERIOR AND POSTERIOR REPAIR N/A 06/08/2020   Procedure: ANTERIOR (CYSTOCELE) AND POSTERIOR REPAIR (RECTOCELE) with perineorrhaphy;  Surgeon: Jaquita Folds, MD;  Location: [facility];  Service: Gynecology;  Laterality: N/A;   CESAREAN SECTION     X 1   CYST EXCISION Right 2003   hand   CYSTOSCOPY N/A 06/08/2020   Procedure: CYSTOSCOPY;  Surgeon: Jaquita Folds, MD;  Location: [facility];  Service: Gynecology;  Laterality: N/A;   NASAL SINUS SURGERY Bilateral 02/10/2020   Procedure: ENDOSCOPIC SINUS SURGERY Maxillary Anstrostomy with Tissue Removal;  Surgeon: Izora Gala, MD;  Location: [facility];  Service: ENT;  Laterality: Bilateral;   VAGINAL HYSTERECTOMY N/A 06/08/2020   Procedure: HYSTERECTOMY VAGINAL with bilateral salpingectomy;  Surgeon: Jaquita Folds, MD;  Location[facility];  Service: Gynecology;  Laterality: N/A;   VAGINAL PROLAPSE REPAIR N/A 06/08/2020   Procedure: UTEROSACRAL SUSPENSION;  Surgeon: Jaquita Folds, MD;  Location: [facility];  Service: Gynecology;  Laterality: N/A;   Patient Active Problem List   Diagnosis Date Noted   Symptomatic cholelithiasis 10/13/2020   Pain in left knee 09/11/2019   HTN (hypertension) 02/27/2015   Unintended weight loss 10/27/2014   Allergic rhinitis 06/23/2014   Vitamin D insufficiency 03/03/2014   Pilonidal cyst 03/03/2014   Broken teeth  03/03/2014   Poor sleep pattern 03/03/2014   Obesity (BMI 30-39.9) 03/03/2014   Former smoker 03/03/2014   History of anemia 05/27/2013    PCP: [name], NP  REFERRING PROVIDER: Cindra Presume, MD  REFERRING DIAG: Pain in thoracic spine, Low back pain, Hypertrophy of breast  THERAPY DIAG:  No diagnosis found.  ONSET DATE: ongoing for years  SUBJECTIVE:                                                                                                                                                                                                         SUBJECTIVE STATEMENT: Patient reports ***  PERTINENT HISTORY:  ***  PAIN:  Are you having pain? Yes VAS scale: ***/10 Pain location: *** Pain orientation: {Pain Orientation:25161}  PAIN TYPE: {type:313116} Pain description: {PAIN DESCRIPTION:21022940}  Aggravating factors: *** Relieving factors: ***  PRECAUTIONS: None  WEIGHT BEARING RESTRICTIONS No  FALLS:  Has patient fallen in last 6 months? No  LIVING ENVIRONMENT: Lives with: {[facility] lives with:25569::"lives with their family"} Lives in: {Lives in:25570} Stairs: {yes/no:20286}; {Stairs:24000} Has following equipment at home: {Assistive devices:23999}  OCCUPATION: ***  PLOF: Independent  PATIENT GOALS: Pain relief and ***  OBJECTIVE:   DIAGNOSTIC FINDINGS:  X-ray left shoulder negative  COGNITION: Overall cognitive status: Within functional limits for tasks assessed   SENSATION: Light touch: Appears intact  POSTURE:  Rounded shoulder posture ***   CERVICAL AROM  A/PROM AROM (deg) 01/20/2021  Flexion   Extension   Right lateral flexion   Left lateral flexion   Right rotation   Left rotation    (Blank rows = not tested)  UE AROM  A/PROM Right 01/20/2021 Left 01/20/2021  Shoulder flexion    Shoulder extension    Shoulder abduction    Shoulder adduction    Shoulder extension    Shoulder internal rotation    Shoulder  external rotation    Elbow flexion    Elbow extension    Wrist flexion    Wrist extension    Wrist ulnar deviation    Wrist radial deviation    Wrist pronation    Wrist supination     (Blank rows = not tested)  UE MMT:  MMT Right 01/20/2021 Left 01/20/2021  Shoulder flexion    Shoulder extension    Shoulder abduction    Shoulder adduction    Shoulder extension    Middle trapezius    Lower trapezius    Elbow flexion    Elbow extension    Wrist flexion    Wrist extension    Wrist ulnar deviation    Wrist radial deviation    Wrist pronation    Wrist supination    Grip strength     (Blank rows = not tested)  CERVICAL SPECIAL TESTS:  {Cervical special tests:25246}  SPINAL SEGMENTAL MOBILITY:  ***  PATIENT SURVEYS:  {rehab surveys:24030}  TODAY'S TREATMENT:  ***   PATIENT EDUCATION:  Education details: Exam findings, POC, HEP Person educated: Patient Education method: Explanation, Demonstration, Tactile cues, Verbal cues, and Handouts Education comprehension: verbalized understanding, returned demonstration, verbal cues required, tactile cues required, and needs further education   HOME EXERCISE PROGRAM: ***   ASSESSMENT: CLINICAL IMPRESSION: Patient is a 45 y.o. female who was seen today for physical therapy evaluation and treatment for ***. Objective impairments include decreased activity tolerance, decreased ROM, decreased strength, postural dysfunction, and pain. These impairments are limiting patient from {activity limitations:25113}. Personal factors including Fitness, Past/current experiences, and Time since onset of injury/illness/exacerbation are also affecting patient's functional outcome. Patient will benefit from skilled PT to address above impairments and improve overall function.  REHAB POTENTIAL: Good  CLINICAL DECISION MAKING: Stable/uncomplicated  EVALUATION COMPLEXITY: Low   GOALS: Goals reviewed with patient? Yes  SHORT TERM  GOALS:  STG Name Target Date Goal status  1 Patient will be I with initial HEP in order to progress with therapy. Baseline: HEP provided at eval 02/10/2021 INITIAL  2 Patient will report pain level with *** </= ***/10 in order to reduce functional limitation Baseline: pain level ***/10 02/11/2021 INITIAL  3 *** Baseline: {follow up:25551} {GOALSTATUS:25110}   LONG  TERM GOALS:   LTG Name Target Date Goal status  1 Patient will be I with final HEP to maintain progress from PT. Baseline: HEP provided at eval 03/03/2021 INITIAL  2 Patient will demonstrate improved range of motion of *** >/= *** deg in order to improve ability to ***. Baseline: 03/04/2021 INITIAL  3 Patient will demonstrate improvement in strength of *** >/= *** in order to improve ***. Baseline: 03/04/2021 INITIAL  4 *** Baseline: {follow up:25551} {GOALSTATUS:25110}   PLAN: PT FREQUENCY: 1x/week  PT DURATION: 6 weeks  PLANNED INTERVENTIONS: Therapeutic exercises, Therapeutic activity, Neuro Muscular re-education, Balance training, Gait training, Patient/Family education, Joint mobilization, Dry Needling, Electrical stimulation, Spinal mobilization, Cryotherapy, Moist heat, Taping, and Manual therapy  PLAN FOR NEXT SESSION: Review HEP and progress PRN, ***   Hilda Blades, PT, DPT, LAT, ATC 01/20/21  1:00 PM Phone: [phone] Fax: [phone]

## 2021-01-22 ENCOUNTER — Ambulatory Visit: Payer: Medicaid Other | Admitting: Physical Therapy

## 2021-01-25 ENCOUNTER — Encounter (INDEPENDENT_AMBULATORY_CARE_PROVIDER_SITE_OTHER): Payer: Medicaid Other | Admitting: Ophthalmology

## 2021-01-26 ENCOUNTER — Ambulatory Visit: Payer: Medicaid Other | Admitting: Orthopaedic Surgery

## 2021-02-04 ENCOUNTER — Ambulatory Visit: Payer: Medicaid Other | Attending: Family Medicine | Admitting: Rehabilitative and Restorative Service Providers"

## 2021-02-04 ENCOUNTER — Encounter: Payer: Self-pay | Admitting: Rehabilitative and Restorative Service Providers"

## 2021-02-04 ENCOUNTER — Other Ambulatory Visit: Payer: Self-pay

## 2021-02-04 DIAGNOSIS — R293 Abnormal posture: Secondary | ICD-10-CM | POA: Diagnosis not present

## 2021-02-04 DIAGNOSIS — M6281 Muscle weakness (generalized): Secondary | ICD-10-CM | POA: Diagnosis not present

## 2021-02-04 DIAGNOSIS — M546 Pain in thoracic spine: Secondary | ICD-10-CM | POA: Diagnosis not present

## 2021-02-04 DIAGNOSIS — G8929 Other chronic pain: Secondary | ICD-10-CM | POA: Insufficient documentation

## 2021-02-04 DIAGNOSIS — M545 Low back pain, unspecified: Secondary | ICD-10-CM | POA: Diagnosis not present

## 2021-02-04 NOTE — Therapy (Addendum)
OUTPATIENT PHYSICAL THERAPY THORACOLUMBAR EVALUATION   Patient Name: Emily Saunders MRN: [id number] DOB:14-Jun-1975, 45 y.o., female Today's Date: 02/04/2021   PT End of Session - 02/04/21 1709     Visit Number 1    Number of Visits 10    Date for PT Re-Evaluation 03/11/21    Authorization Type [employer] Access    Progress Note Due on Visit 10    PT Start Time [phone]    PT Stop Time 0411    PT Time Calculation (min) 38 min    Activity Tolerance Patient tolerated treatment well;No increased pain    Behavior During Therapy [facility] for tasks assessed/performed             Past Medical History:  Diagnosis Date   GERD (gastroesophageal reflux disease)    H/O pilonidal cyst 2002    History of hiatal hernia    Hypertension    PID (acute pelvic inflammatory disease) 2006    Pneumonia 2016   Vitamin D deficiency 2015    Wears dentures    FULL SET   Past Surgical History:  Procedure Laterality Date   ANTERIOR AND POSTERIOR REPAIR N/A 06/08/2020   Procedure: ANTERIOR (CYSTOCELE) AND POSTERIOR REPAIR (RECTOCELE) with perineorrhaphy;  Surgeon: Jaquita Folds, MD;  Location: [place];  Service: Gynecology;  Laterality: N/A;   CESAREAN SECTION     X 1   CYST EXCISION Right 2003   hand   CYSTOSCOPY N/A 06/08/2020   Procedure: CYSTOSCOPY;  Surgeon: Jaquita Folds, MD;  Location: [facility];  Service: Gynecology;  Laterality: N/A;   NASAL SINUS SURGERY Bilateral 02/10/2020   Procedure: ENDOSCOPIC SINUS SURGERY Maxillary Anstrostomy with Tissue Removal;  Surgeon: Izora Gala, MD;  Location: [place];  Service: ENT;  Laterality: Bilateral;   VAGINAL HYSTERECTOMY N/A 06/08/2020   Procedure: HYSTERECTOMY VAGINAL with bilateral salpingectomy;  Surgeon: Jaquita Folds, MD;  Location: [facility];  Service: Gynecology;  Laterality: N/A;   VAGINAL PROLAPSE REPAIR N/A 06/08/2020   Procedure: UTEROSACRAL SUSPENSION;   Surgeon: Jaquita Folds, MD;  Location: [facility];  Service: Gynecology;  Laterality: N/A;   Patient Active Problem List   Diagnosis Date Noted   Symptomatic cholelithiasis 10/13/2020   Pain in left knee 09/11/2019   HTN (hypertension) 02/27/2015   Unintended weight loss 10/27/2014   Allergic rhinitis 06/23/2014   Vitamin D insufficiency 03/03/2014   Pilonidal cyst 03/03/2014   Broken teeth 03/03/2014   Poor sleep pattern 03/03/2014   Obesity (BMI 30-39.9) 03/03/2014   Former smoker 03/03/2014   History of anemia 05/27/2013    PCP: [name], NP  REFERRING PROVIDER: Mingo Amber, MD  REFERRING DIAG: 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 They are talking about a breast reduction but want to see if therapy will help.  PERTINENT HISTORY:  Back pain has been for years insidious onset. When pt began developing breast is when back pain began.   PAIN:  Are you having pain? Yes VAS scale: 5-6/10 Pain location: upper thoracic Pain orientation: Bilateral and Posterior  PAIN TYPE: aching Pain description: constant  Aggravating factors: sitting Relieving factors: prone  PRECAUTIONS: None  WEIGHT BEARING RESTRICTIONS No  FALLS:  Has patient fallen in last 6 months? No,   LIVING ENVIRONMENT: Lives with: lives with their family Lives in: House/apartment   OCCUPATION: [employer]  PLOF: Independent  PATIENT GOALS : to be able to move around and sit too without  pain   OBJECTIVE:   DIAGNOSTIC FINDINGS:  MRI L shoulder ordered; L shoulder XRay 09/30/20 with no significant findings   SCREENING FOR RED FLAGS: Bowel or bladder incontinence: No Spinal tumors: No Cauda equina syndrome: No Compression fracture: No Abdominal aneurysm: No  COGNITION:  Overall cognitive status: Within functional limits for tasks assessed     SENSATION:  Light touch: Appears intact    POSTURE:  Rounded shoulders bil L > R, R Upper Trap tighter, thoracic kyphosis, L scapula elevated; iliac crest height even, gluteal fold/popliteal fossa equal  PALPATION: ASIS =, T7-T10 TTP with T9/10 hypomobile; Lumbar paraspinals bil tight and TTP, R glute tigher than L, sacral border =  LUMBARAROM/PROM  A/PROM A/PROM  02/04/2021  Flexion WNL  Extension Limited 75%  Right lateral flexion WNL  Left lateral flexion WNL  Right rotation Limited 50%  Left rotation Limited 50%   (Blank rows = not tested): extension with R lumbar pain  Cervical AROM: All WNL and =    Shoulder AROM: All WNL and =    Hip limited PROM R flexion with tightness palpated but all others WNL.   LE MMT:  MMT Right 02/04/2021 Left 02/04/2021  Hip flexion 4+/5 4+/5  Hip extension    Hip abduction 4+/5 4+/5  Hip adduction 4+/5 4+/5  Hip internal rotation    Hip external rotation    Knee flexion    Knee extension    Ankle dorsiflexion    Ankle plantarflexion    Ankle inversion    Ankle eversion     (Blank rows = not tested)  UE MMT: IR 5/5, ER bil 3+/5, Mid Trap L 3+/5 and R 4+/5; shoulder ext bil 3+/5   TODAY'S TREATMENT  HEP issued; Reviewed POC; discussed importance of posture   PATIENT EDUCATION:  Education details: HEP, postural education, anatomy review, placing towel roll in pillow case with 1-2 pillows, discussed why prone postioning more comfortable than sitting due to weight of breasts, rotating mattress Person educated: Patient Education method: Explanation,  Demonstration, and Handouts Education comprehension: verbalized understanding and returned demonstration   HOME EXERCISE PROGRAM: Scap retract x 15-20 reps 2x/day; access code: [email or web address]  ASSESSMENT:  CLINICAL IMPRESSION: Patient is a 45 y.o. female who was seen today for physical therapy evaluation and treatment for cervicothoraciclumbar pain. Objective impairments include decreased activity tolerance, decreased mobility, decreased ROM, decreased strength, hypomobility, impaired flexibility, postural dysfunction, and pain. These impairments are limiting patient from meal prep and sitting . Personal factors including 1 comorbidity: macromastia  are also affecting patient's functional outcome. T7-T10 TTP with T9/10 hypomobilePatient will benefit from skilled PT to address above impairments and improve overall function.  REHAB POTENTIAL: Good  CLINICAL DECISION MAKING: Stable/uncomplicated  EVALUATION COMPLEXITY: Low   GOALS: Goals reviewed with patient? Yes  SHORT TERM GOALS:  STG Name Target Date Goal status  1 Pt will be indep with initial HEP or thoracolumbar pain management Baseline: initiated at evaluation 02/18/2021 INITIAL  2 Pt will report decreased thoracic pain with postural awareness and using HEP to assist with sitting x 25% Baseline: 0% 02/18/2021 INITIAL   LONG TERM GOALS:   LTG Name Target Date Goal status  1 Pt will be independent with advanced HEP for back pain management. Baseline:initiated at eval 03/11/21 INITIAL  2 Pt will be able to sit on the floor x 30 min with </= 3/10 thoracic pain Baseline:up to 6/10 03/11/21 INITIAL  3 Pt will be able to report decreased LBP to present < 3x weekly with functional mobility Baseline:intermittently throughout the week up to 5x 03/11/21 INITIAL  4 Pt will demo improved ER/mid thoracic MMT >/= 1 MMT grade to assist with posture when sitting Baseline: ER bil 3+/5, Mid Trap L 3+/5 and R 4+/5; shoulder ext bil 3+/5 03/11/21 INITIAL  5  Pt will report improvement in resting in bed at night x 50% Baseline:unable 03/11/21 INITIAL   PLAN: PT FREQUENCY: 2x/week  PT DURATION: other: 5 weeks  PLANNED INTERVENTIONS: Therapeutic exercises, Therapeutic activity, Neuro Muscular re-education, Balance training, Gait training, Patient/Family education, Joint mobilization, Dry Needling, Electrical stimulation, Spinal mobilization, Cryotherapy, Moist heat, scar mobilization, Taping, Ultrasound, and Manual therapy  PLAN FOR NEXT SESSION: Review scap retract, spinal mobs T7-10, thoracolumbar strengthening, begin pelvic core therex, sitting posture education   America Brown, PT, DPT 02/04/2021, 5:10 PM     Check all possible CPT codes: 97110- Therapeutic Exercise, [phone]- Neuro Re-education, [id number] - Gait Training, [phone] - Manual Therapy, 97530 - Therapeutic Activities, 97535 - Self Care, and 97035 - Ultrasound         Kristoffer Leamon PT, DPT, LAT, ATC  02/11/21  4:34 PM

## 2021-02-13 ENCOUNTER — Ambulatory Visit: Payer: Medicaid Other | Admitting: Rehabilitative and Restorative Service Providers"

## 2021-02-13 NOTE — Therapy (Incomplete)
OUTPATIENT PHYSICAL THERAPY TREATMENT NOTE   Patient Name: Emily Saunders MRN: [id number] DOB:12/09/1975, 46 y.o., female Today's Date: 02/13/2021  PCP: [name], NP REFERRING PROVIDER: [name], NP    Past Medical History:  Diagnosis Date   GERD (gastroesophageal reflux disease)    H/O pilonidal cyst 2002    History of hiatal hernia    Hypertension    PID (acute pelvic inflammatory disease) 2006    Pneumonia 2016   Vitamin D deficiency 2015    Wears dentures    FULL SET   Past Surgical History:  Procedure Laterality Date   ANTERIOR AND POSTERIOR REPAIR N/A 06/08/2020   Procedure: ANTERIOR (CYSTOCELE) AND POSTERIOR REPAIR (RECTOCELE) with perineorrhaphy;  Surgeon: Jaquita Folds, MD;  Location: [place];  Service: Gynecology;  Laterality: N/A;   CESAREAN SECTION     X 1   CYST EXCISION Right 2003   hand   CYSTOSCOPY N/A 06/08/2020   Procedure: CYSTOSCOPY;  Surgeon: Jaquita Folds, MD;  Location: [facility];  Service: Gynecology;  Laterality: N/A;   NASAL SINUS SURGERY Bilateral 02/10/2020   Procedure: ENDOSCOPIC SINUS SURGERY Maxillary Anstrostomy with Tissue Removal;  Surgeon: Izora Gala, MD;  Location: [place];  Service: ENT;  Laterality: Bilateral;   VAGINAL HYSTERECTOMY N/A 06/08/2020   Procedure: HYSTERECTOMY VAGINAL with bilateral salpingectomy;  Surgeon: Jaquita Folds, MD;  Location: [facility];  Service: Gynecology;  Laterality: N/A;   VAGINAL PROLAPSE REPAIR N/A 06/08/2020   Procedure: UTEROSACRAL SUSPENSION;  Surgeon: Jaquita Folds, MD;  Location: [facility];  Service: Gynecology;  Laterality: N/A;   Patient Active Problem List   Diagnosis Date Noted   Symptomatic cholelithiasis 10/13/2020   Pain in left knee 09/11/2019   HTN (hypertension) 02/27/2015   Unintended weight loss 10/27/2014   Allergic rhinitis 06/23/2014   Vitamin D  insufficiency 03/03/2014   Pilonidal cyst 03/03/2014   Broken teeth 03/03/2014   Poor sleep pattern 03/03/2014   Obesity (BMI 30-39.9) 03/03/2014   Former smoker 03/03/2014   History of anemia 05/27/2013    REFERRING DIAG: Thoracolumbar back pain related to macromastia  THERAPY DIAG:  No diagnosis found.  PERTINENT HISTORY: Back pain has been for years insidious onset. When pt began developing breast is when back pain began.   PRECAUTIONS: none  SUBJECTIVE: ***  PAIN:  Are you having pain? {yes/no:20286} VAS scale: ***/10 Pain location: *** Pain orientation: {Pain Orientation:25161}  PAIN TYPE: {type:313116} Pain description: {PAIN DESCRIPTION:21022940}  Aggravating factors: *** Relieving factors: ***     OBJECTIVE:    DIAGNOSTIC FINDINGS:  MRI L shoulder ordered; L shoulder XRay 09/30/20 with no significant findings     SCREENING FOR RED FLAGS: Bowel or bladder incontinence: No Spinal tumors: No Cauda equina syndrome: No Compression fracture: No Abdominal aneurysm: No   COGNITION:          Overall cognitive status: Within functional limits for tasks assessed                        SENSATION:          Light touch: Appears intact             POSTURE:  Rounded shoulders bil L > R, R Upper Trap tighter, thoracic kyphosis, L scapula elevated; iliac crest height even, gluteal fold/popliteal fossa equal   PALPATION: ASIS =, T7-T10 TTP with T9/10 hypomobile; Lumbar paraspinals bil  tight and TTP, R glute tigher than L, sacral border =   LUMBARAROM/PROM   A/PROM A/PROM  02/04/2021  Flexion WNL  Extension Limited 75%  Right lateral flexion WNL  Left lateral flexion WNL  Right rotation Limited 50%  Left rotation Limited 50%   (Blank rows = not tested): extension with R lumbar pain   Cervical AROM: All WNL and =                       Shoulder AROM: All WNL and =                       Hip limited PROM R flexion with tightness palpated but all others WNL.     LE MMT:   MMT Right 02/04/2021 Left 02/04/2021  Hip flexion 4+/5 4+/5  Hip extension      Hip abduction 4+/5 4+/5  Hip adduction 4+/5 4+/5  Hip internal rotation      Hip external rotation      Knee flexion      Knee extension      Ankle dorsiflexion      Ankle plantarflexion      Ankle inversion      Ankle eversion       (Blank rows = not tested)   UE MMT: IR 5/5, ER bil 3+/5, Mid Trap L 3+/5 and R 4+/5; shoulder ext bil 3+/5     TODAY'S TREATMENT : [facility] Adult PT Treatment:                                                DATE: 02/14/20 Therapeutic Exercise: *** Manual Therapy: *** Neuromuscular re-ed: *** Therapeutic Activity: *** Modalities: *** Self Care: ***     02/04/21:HEP issued; Reviewed POC; discussed importance of posture     PATIENT EDUCATION:  Education details: HEP, postural education, anatomy review, placing towel roll in pillow case with 1-2 pillows, discussed why prone postioning more comfortable than sitting due to weight of breasts, rotating mattress Person educated: Patient Education method: Explanation, Demonstration, and Handouts Education comprehension: verbalized understanding and returned demonstration     HOME EXERCISE PROGRAM: Scap retract x 15-20 reps 2x/day; access code: [email or web address]   ASSESSMENT:   CLINICAL IMPRESSION: Patient is a 46 y.o. female who was seen today for physical therapy evaluation and treatment for cervicothoraciclumbar pain. Objective impairments include decreased activity tolerance, decreased mobility, decreased ROM, decreased strength, hypomobility, impaired flexibility, postural dysfunction, and pain. These impairments are limiting patient from meal prep and sitting . Personal factors including 1 comorbidity: macromastia  are also affecting patient's functional outcome. T7-T10 TTP with T9/10 hypomobilePatient will benefit from skilled PT to address above impairments and improve overall function.   REHAB POTENTIAL:  Good   CLINICAL DECISION MAKING: Stable/uncomplicated   EVALUATION COMPLEXITY: Low     GOALS: Goals reviewed with patient? Yes   SHORT TERM GOALS:   STG Name Target Date Goal status  1 Pt will be indep with initial HEP or thoracolumbar pain management Baseline: initiated at evaluation 02/18/2021 INITIAL  2 Pt will report decreased thoracic pain with postural awareness and using HEP to assist with sitting x 25% Baseline: 0% 02/18/2021 INITIAL    LONG TERM GOALS:    LTG Name Target Date Goal status  1 Pt will be independent with  advanced HEP for back pain management. Baseline:initiated at eval 03/11/21 INITIAL  2 Pt will be able to sit on the floor x 30 min with </= 3/10 thoracic pain Baseline:up to 6/10 03/11/21 INITIAL  3 Pt will be able to report decreased LBP to present < 3x weekly with functional mobility Baseline:intermittently throughout the week up to 5x 03/11/21 INITIAL  4 Pt will demo improved ER/mid thoracic MMT >/= 1 MMT grade to assist with posture when sitting Baseline: ER bil 3+/5, Mid Trap L 3+/5 and R 4+/5; shoulder ext bil 3+/5 03/11/21 INITIAL  5 Pt will report improvement in resting in bed at night x 50% Baseline:unable 03/11/21 INITIAL    PLAN: PT FREQUENCY: 2x/week   PT DURATION: other: 5 weeks   PLANNED INTERVENTIONS: Therapeutic exercises, Therapeutic activity, Neuro Muscular re-education, Balance training, Gait training, Patient/Family education, Joint mobilization, Dry Needling, Electrical stimulation, Spinal mobilization, Cryotherapy, Moist heat, scar mobilization, Taping, Ultrasound, and Manual therapy   PLAN FOR NEXT SESSION: Review scap retract, spinal mobs T7-10, thoracolumbar strengthening, begin pelvic core therex, sitting posture education    America Brown 02/13/2021, 8:05 AM

## 2021-02-16 DIAGNOSIS — J302 Other seasonal allergic rhinitis: Secondary | ICD-10-CM | POA: Diagnosis not present

## 2021-02-16 DIAGNOSIS — R1310 Dysphagia, unspecified: Secondary | ICD-10-CM | POA: Diagnosis not present

## 2021-02-16 DIAGNOSIS — R0989 Other specified symptoms and signs involving the circulatory and respiratory systems: Secondary | ICD-10-CM | POA: Diagnosis not present

## 2021-02-16 DIAGNOSIS — J3089 Other allergic rhinitis: Secondary | ICD-10-CM | POA: Diagnosis not present

## 2021-02-17 ENCOUNTER — Ambulatory Visit: Payer: Medicaid Other | Attending: Plastic Surgery | Admitting: Physical Therapy

## 2021-02-17 ENCOUNTER — Other Ambulatory Visit: Payer: Self-pay

## 2021-02-17 ENCOUNTER — Encounter: Payer: Self-pay | Admitting: Physical Therapy

## 2021-02-17 DIAGNOSIS — R293 Abnormal posture: Secondary | ICD-10-CM | POA: Diagnosis not present

## 2021-02-17 DIAGNOSIS — M6281 Muscle weakness (generalized): Secondary | ICD-10-CM | POA: Diagnosis not present

## 2021-02-17 DIAGNOSIS — R278 Other lack of coordination: Secondary | ICD-10-CM | POA: Insufficient documentation

## 2021-02-17 DIAGNOSIS — G8929 Other chronic pain: Secondary | ICD-10-CM | POA: Diagnosis not present

## 2021-02-17 DIAGNOSIS — M546 Pain in thoracic spine: Secondary | ICD-10-CM | POA: Diagnosis not present

## 2021-02-17 DIAGNOSIS — M545 Low back pain, unspecified: Secondary | ICD-10-CM | POA: Diagnosis not present

## 2021-02-17 NOTE — Therapy (Signed)
OUTPATIENT PHYSICAL THERAPY TREATMENT NOTE   Patient Name: Emily Saunders MRN: [id number] DOB:07/11/1975, 46 y.o., female Today's Date: 02/17/2021  PCP: [name], NP REFERRING PROVIDER: Cindra Presume, MD   PT End of Session - 02/17/21 1109     Visit Number 2    Number of Visits 10    Date for PT Re-Evaluation 03/11/21    Authorization Type [employer] Access    Progress Note Due on Visit 10    PT Start Time 1105    PT Stop Time 9924    PT Time Calculation (min) 40 min             Past Medical History:  Diagnosis Date   GERD (gastroesophageal reflux disease)    H/O pilonidal cyst 2002    History of hiatal hernia    Hypertension    PID (acute pelvic inflammatory disease) 2006    Pneumonia 2016   Vitamin D deficiency 2015    Wears dentures    FULL SET   Past Surgical History:  Procedure Laterality Date   ANTERIOR AND POSTERIOR REPAIR N/A 06/08/2020   Procedure: ANTERIOR (CYSTOCELE) AND POSTERIOR REPAIR (RECTOCELE) with perineorrhaphy;  Surgeon: Jaquita Folds, MD;  Location: [place];  Service: Gynecology;  Laterality: N/A;   CESAREAN SECTION     X 1   CYST EXCISION Right 2003   hand   CYSTOSCOPY N/A 06/08/2020   Procedure: CYSTOSCOPY;  Surgeon: Jaquita Folds, MD;  Location: [facility];  Service: Gynecology;  Laterality: N/A;   NASAL SINUS SURGERY Bilateral 02/10/2020   Procedure: ENDOSCOPIC SINUS SURGERY Maxillary Anstrostomy with Tissue Removal;  Surgeon: Izora Gala, MD;  Location: [place];  Service: ENT;  Laterality: Bilateral;   VAGINAL HYSTERECTOMY N/A 06/08/2020   Procedure: HYSTERECTOMY VAGINAL with bilateral salpingectomy;  Surgeon: Jaquita Folds, MD;  Location: [facility];  Service: Gynecology;  Laterality: N/A;   VAGINAL PROLAPSE REPAIR N/A 06/08/2020   Procedure: UTEROSACRAL SUSPENSION;  Surgeon: Jaquita Folds, MD;  Location: [facility];   Service: Gynecology;  Laterality: N/A;   Patient Active Problem List   Diagnosis Date Noted   Symptomatic cholelithiasis 10/13/2020   Pain in left knee 09/11/2019   HTN (hypertension) 02/27/2015   Unintended weight loss 10/27/2014   Allergic rhinitis 06/23/2014   Vitamin D insufficiency 03/03/2014   Pilonidal cyst 03/03/2014   Broken teeth 03/03/2014   Poor sleep pattern 03/03/2014   Obesity (BMI 30-39.9) 03/03/2014   Former smoker 03/03/2014   History of anemia 05/27/2013    PCP: [name], NP   REFERRING PROVIDER: Mingo Amber, MD   REFERRING DIAG: 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 When pt began developing breast is when back pain began.   PRECAUTIONS: None   WEIGHT BEARING RESTRICTIONS No  SUBJECTIVE: I flipped my mattress and did the exercise. I am still about the same.   PAIN:  Are you having pain? Yes VAS scale: 4/10 Pain location: mid back Pain orientation: Bilateral and Posterior  PAIN TYPE: aching Pain description: constant  Aggravating factors: sitting Relieving factors: prone     OBJECTIVE:    DIAGNOSTIC FINDINGS:  MRI L shoulder ordered; L shoulder XRay 09/30/20 with no significant findings     SCREENING FOR RED FLAGS: Bowel or bladder incontinence: No Spinal tumors: No Cauda equina syndrome: No Compression fracture: No Abdominal aneurysm: No   COGNITION:          Overall cognitive status: Within functional limits for tasks assessed                        SENSATION:          Light touch: Appears intact             POSTURE:  Rounded shoulders bil L > R, R Upper Trap tighter, thoracic kyphosis, L scapula elevated; iliac crest  height even, gluteal fold/popliteal fossa equal   PALPATION: ASIS =, T7-T10 TTP with T9/10 hypomobile; Lumbar paraspinals bil tight and TTP, R glute tigher than L, sacral border =   LUMBARAROM/PROM   A/PROM A/PROM  02/04/2021  Flexion WNL  Extension Limited 75%  Right lateral flexion WNL  Left lateral flexion WNL  Right rotation Limited 50%  Left rotation Limited 50%   (Blank rows = not tested): extension with R lumbar pain   Cervical AROM: All WNL and =                       Shoulder AROM: All WNL and =                       Hip limited PROM R flexion with tightness palpated but all others WNL.    LE MMT:   MMT Right 02/04/2021 Left 02/04/2021  Hip flexion 4+/5 4+/5  Hip extension      Hip abduction 4+/5 4+/5  Hip adduction 4+/5 4+/5  Hip internal rotation      Hip external rotation      Knee flexion      Knee extension      Ankle dorsiflexion      Ankle plantarflexion      Ankle inversion      Ankle eversion       (Blank rows = not tested)   UE MMT: IR 5/5, ER bil 3+/5, Mid Trap L 3+/5 and R 4+/5; shoulder ext bil 3+/5  TODAY's Treamtent        02/17/21 Therapeutic Exercise  -Nustep L5 x 5 min  -Standing rows with GTB 15 x 2  -Standing star pattern with red band (horizontal and diagonals)   -Doorway pec stretch   -Side lying open books   -cat/camel  -thoracic extension over soft foam roller - arm over head x 2 minutes      Initial TREATMENT  HEP issued; Reviewed POC; discussed importance of posture     PATIENT EDUCATION:  Education details: Updated HEP Issed  Person educated: Patient Education method: Explanation, Demonstration, and Handouts Education comprehension: verbalized understanding and returned demonstration     HOME EXERCISE PROGRAM: Access Code: [email or web address] URL: [email or web address] Date: 02/17/2021 Prepared by: Hessie Diener  Exercises Seated Scapular Retraction - 2 x daily - 7 x weekly - 1 sets - 15-20  reps Alternating star pattern - 1 x daily - 7 x weekly - 2-3 sets - 10 reps Standing Low Shoulder Row with Anchored Resistance - 1 x daily - 7 x weekly - 3 sets - 10 reps Sidelying Open Book Thoracic Lumbar Rotation and Extension - 1 x daily - 7 x weekly - 1 sets -  5 reps - 10-20 hold    ASSESSMENT:   CLINICAL IMPRESSION: Patient reports she is about the same. She flipped her mattress at notes no change in pain. She has been compliant with her HEP exercise. Session spent with progression of HEP. Added scap strength and thoracolumbar flexibility. She tolerated the session well without reporting pain increase. Will continued to progress as tolerated.    REHAB POTENTIAL: Good   CLINICAL DECISION MAKING: Stable/uncomplicated   EVALUATION COMPLEXITY: Low     GOALS: Goals reviewed with patient? Yes   SHORT TERM GOALS:   STG Name Target Date Goal status  1 Pt will be indep with initial HEP or thoracolumbar pain management Baseline: initiated at evaluation 02/18/2021 INITIAL  2 Pt will report decreased thoracic pain with postural awareness and using HEP to assist with sitting x 25% Baseline: 0% 02/18/2021 INITIAL    LONG TERM GOALS:    LTG Name Target Date Goal status  1 Pt will be independent with advanced HEP for back pain management. Baseline:initiated at eval 03/11/21 INITIAL  2 Pt will be able to sit on the floor x 30 min with </= 3/10 thoracic pain Baseline:up to 6/10 03/11/21 INITIAL  3 Pt will be able to report decreased LBP to present < 3x weekly with functional mobility Baseline:intermittently throughout the week up to 5x 03/11/21 INITIAL  4 Pt will demo improved ER/mid thoracic MMT >/= 1 MMT grade to assist with posture when sitting Baseline: ER bil 3+/5, Mid Trap L 3+/5 and R 4+/5; shoulder ext bil 3+/5 03/11/21 INITIAL  5 Pt will report improvement in resting in bed at night x 50% Baseline:unable 03/11/21 INITIAL    PLAN: PT FREQUENCY: 2x/week   PT DURATION: other: 5 weeks    PLANNED INTERVENTIONS: Therapeutic exercises, Therapeutic activity, Neuro Muscular re-education, Balance training, Gait training, Patient/Family education, Joint mobilization, Dry Needling, Electrical stimulation, Spinal mobilization, Cryotherapy, Moist heat, scar mobilization, Taping, Ultrasound, and Manual therapy   PLAN FOR NEXT SESSION: Review scap retract, spinal mobs T7-10, thoracolumbar strengthening, begin pelvic core therex, sitting posture education      Hessie Diener, PTA 02/17/21 12:16 PM Phone: [phone] Fax: [phone]

## 2021-02-19 ENCOUNTER — Other Ambulatory Visit: Payer: Self-pay

## 2021-02-19 ENCOUNTER — Encounter: Payer: Self-pay | Admitting: Physical Therapy

## 2021-02-19 ENCOUNTER — Ambulatory Visit: Payer: Medicaid Other | Admitting: Physical Therapy

## 2021-02-19 DIAGNOSIS — M546 Pain in thoracic spine: Secondary | ICD-10-CM | POA: Diagnosis not present

## 2021-02-19 DIAGNOSIS — M6281 Muscle weakness (generalized): Secondary | ICD-10-CM

## 2021-02-19 DIAGNOSIS — R293 Abnormal posture: Secondary | ICD-10-CM

## 2021-02-19 DIAGNOSIS — G8929 Other chronic pain: Secondary | ICD-10-CM | POA: Diagnosis not present

## 2021-02-19 DIAGNOSIS — M545 Low back pain, unspecified: Secondary | ICD-10-CM | POA: Diagnosis not present

## 2021-02-19 DIAGNOSIS — R278 Other lack of coordination: Secondary | ICD-10-CM | POA: Diagnosis not present

## 2021-02-19 NOTE — Therapy (Signed)
OUTPATIENT PHYSICAL THERAPY TREATMENT NOTE   Patient Name: Emily Saunders MRN: [id number] DOB:1975/03/31, 46 y.o., female Today's Date: 02/19/2021  PCP: [name], NP REFERRING PROVIDER: Cindra Presume, MD   PT End of Session - 02/19/21 1105     Visit Number 3    Number of Visits 10    Date for PT Re-Evaluation 03/11/21    Authorization Type [employer]    PT Start Time 1103    PT Stop Time 3790    PT Time Calculation (min) 42 min             Past Medical History:  Diagnosis Date   GERD (gastroesophageal reflux disease)    H/O pilonidal cyst 2002    History of hiatal hernia    Hypertension    PID (acute pelvic inflammatory disease) 2006    Pneumonia 2016   Vitamin D deficiency 2015    Wears dentures    FULL SET   Past Surgical History:  Procedure Laterality Date   ANTERIOR AND POSTERIOR REPAIR N/A 06/08/2020   Procedure: ANTERIOR (CYSTOCELE) AND POSTERIOR REPAIR (RECTOCELE) with perineorrhaphy;  Surgeon: Jaquita Folds, MD;  Location: [place];  Service: Gynecology;  Laterality: N/A;   CESAREAN SECTION     X 1   CYST EXCISION Right 2003   hand   CYSTOSCOPY N/A 06/08/2020   Procedure: CYSTOSCOPY;  Surgeon: Jaquita Folds, MD;  Location: [facility];  Service: Gynecology;  Laterality: N/A;   NASAL SINUS SURGERY Bilateral 02/10/2020   Procedure: ENDOSCOPIC SINUS SURGERY Maxillary Anstrostomy with Tissue Removal;  Surgeon: Izora Gala, MD;  Location: [place];  Service: ENT;  Laterality: Bilateral;   VAGINAL HYSTERECTOMY N/A 06/08/2020   Procedure: HYSTERECTOMY VAGINAL with bilateral salpingectomy;  Surgeon: Jaquita Folds, MD;  Location: [facility];  Service: Gynecology;  Laterality: N/A;   VAGINAL PROLAPSE REPAIR N/A 06/08/2020   Procedure: UTEROSACRAL SUSPENSION;  Surgeon: Jaquita Folds, MD;  Location: [facility];  Service: Gynecology;  Laterality:  N/A;   Patient Active Problem List   Diagnosis Date Noted   Symptomatic cholelithiasis 10/13/2020   Pain in left knee 09/11/2019   HTN (hypertension) 02/27/2015   Unintended weight loss 10/27/2014   Allergic rhinitis 06/23/2014   Vitamin D insufficiency 03/03/2014   Pilonidal cyst 03/03/2014   Broken teeth 03/03/2014   Poor sleep pattern 03/03/2014   Obesity (BMI 30-39.9) 03/03/2014   Former smoker 03/03/2014   History of anemia 05/27/2013    PCP: [name], NP   REFERRING PROVIDER: Mingo Amber, MD   REFERRING DIAG: 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 When pt began developing breast is when back pain began.   PRECAUTIONS: None   WEIGHT BEARING RESTRICTIONS No  SUBJECTIVE: The pain is still there everyday. It is hurting. I felt good after last visit and then the pain was back again.   PAIN:  Are you having pain? Yes VAS scale: 6/10 Pain location: mid back Pain orientation: Bilateral and Posterior  PAIN TYPE: aching Pain description: constant  Aggravating factors: sitting Relieving factors: prone     OBJECTIVE:    DIAGNOSTIC FINDINGS:  MRI L shoulder ordered; L shoulder XRay 09/30/20 with no significant findings  SCREENING FOR RED FLAGS: Bowel or bladder incontinence: No Spinal tumors: No Cauda equina syndrome: No Compression fracture: No Abdominal aneurysm: No   COGNITION:          Overall cognitive status: Within functional limits for tasks assessed                        SENSATION:          Light touch: Appears intact             POSTURE:  Rounded shoulders bil L > R, R Upper Trap tighter, thoracic kyphosis, L scapula elevated; iliac crest height even, gluteal fold/popliteal fossa equal   PALPATION: ASIS =,  T7-T10 TTP with T9/10 hypomobile; Lumbar paraspinals bil tight and TTP, R glute tigher than L, sacral border =   LUMBARAROM/PROM   A/PROM A/PROM  02/04/2021  Flexion WNL  Extension Limited 75%  Right lateral flexion WNL  Left lateral flexion WNL  Right rotation Limited 50%  Left rotation Limited 50%   (Blank rows = not tested): extension with R lumbar pain   Cervical AROM: All WNL and =                       Shoulder AROM: All WNL and =                       Hip limited PROM R flexion with tightness palpated but all others WNL.    LE MMT:   MMT Right 02/04/2021 Left 02/04/2021  Hip flexion 4+/5 4+/5  Hip extension      Hip abduction 4+/5 4+/5  Hip adduction 4+/5 4+/5  Hip internal rotation      Hip external rotation      Knee flexion      Knee extension      Ankle dorsiflexion      Ankle plantarflexion      Ankle inversion      Ankle eversion       (Blank rows = not tested)   UE MMT: IR 5/5, ER bil 3+/5, Mid Trap L 3+/5 and R 4+/5; shoulder ext bil 3+/5  TODAY's Treamtent        02/19/21 Therapeutic Exercise  -Nustep L5 x 5 min  -Standing rows with BTB 15 x 2  -Standing shoulder extension GTB 15 x 2   -Standing star pattern with GTB (horizontal and diagonals) x 10  -supine bilat horizontal abduction green, ER x15 each   -bridge x 10   -Doorway pec stretch   -Side lying open books   -cat/camel  - childs pose with reach through x 2 each 10 sec   -thoracic extension over soft foam roller - arm over head x 2 minutes   -thoracic extension in chair- hands behind head vs hands crossed on chest    Treatment        02/17/21 Therapeutic Exercise  -Nustep L5 x 5 min  -Standing rows with GTB 15 x 2  -Standing star pattern with red band (horizontal and diagonals)   -Doorway pec stretch   -Side lying open books   -cat/camel  -thoracic extension over soft foam roller - arm over head x 2 minutes       Initial TREATMENT  HEP issued; Reviewed POC; discussed  importance of posture     PATIENT EDUCATION:  Education details: Updated HEP Issed  Person educated: Patient Education method: Explanation, Demonstration, and  Handouts Education comprehension: verbalized understanding and returned demonstration     HOME EXERCISE PROGRAM: Access Code: [email or web address] URL: [email or web address] Date: 02/17/2021 Prepared by: Hessie Diener  Exercises Seated Scapular Retraction - 2 x daily - 7 x weekly - 1 sets - 15-20 reps Alternating star pattern - 1 x daily - 7 x weekly - 2-3 sets - 10 reps Standing Low Shoulder Row with Anchored Resistance - 1 x daily - 7 x weekly - 3 sets - 10 reps Sidelying Open Book Thoracic Lumbar Rotation and Extension - 1 x daily - 7 x weekly - 1 sets - 5 reps - 10-20 hold    ASSESSMENT:   CLINICAL IMPRESSION: Patient reports she is about the same. She did feel better after last session but pain returned later in the day. Continued with postural stretching and strengthening with progression of core strength. Pt with increased pain during standing star pattern, better in supine. Will continued to progress as tolerated.    REHAB POTENTIAL: Good   CLINICAL DECISION MAKING: Stable/uncomplicated   EVALUATION COMPLEXITY: Low     GOALS: Goals reviewed with patient? Yes   SHORT TERM GOALS:   STG Name Target Date Goal status  1 Pt will be indep with initial HEP or thoracolumbar pain management Baseline: initiated at evaluation 02/18/2021 INITIAL  2 Pt will report decreased thoracic pain with postural awareness and using HEP to assist with sitting x 25% Baseline: 0% 02/18/2021 INITIAL    LONG TERM GOALS:    LTG Name Target Date Goal status  1 Pt will be independent with advanced HEP for back pain management. Baseline:initiated at eval 03/11/21 INITIAL  2 Pt will be able to sit on the floor x 30 min with </= 3/10 thoracic pain Baseline:up to 6/10 03/11/21 INITIAL  3 Pt will be able to report decreased LBP to present <  3x weekly with functional mobility Baseline:intermittently throughout the week up to 5x 03/11/21 INITIAL  4 Pt will demo improved ER/mid thoracic MMT >/= 1 MMT grade to assist with posture when sitting Baseline: ER bil 3+/5, Mid Trap L 3+/5 and R 4+/5; shoulder ext bil 3+/5 03/11/21 INITIAL  5 Pt will report improvement in resting in bed at night x 50% Baseline:unable 03/11/21 INITIAL    PLAN: PT FREQUENCY: 2x/week   PT DURATION: other: 5 weeks   PLANNED INTERVENTIONS: Therapeutic exercises, Therapeutic activity, Neuro Muscular re-education, Balance training, Gait training, Patient/Family education, Joint mobilization, Dry Needling, Electrical stimulation, Spinal mobilization, Cryotherapy, Moist heat, scar mobilization, Taping, Ultrasound, and Manual therapy   PLAN FOR NEXT SESSION: ReviewHEP, spinal mobs T7-10, thoracolumbar strengthening,  progress pelvic core therex, sitting posture education, consider foam roller routine with bands       Hessie Diener, PTA 02/19/21 11:36 AM Phone: [phone] Fax: [phone]

## 2021-02-24 ENCOUNTER — Other Ambulatory Visit: Payer: Self-pay

## 2021-02-24 ENCOUNTER — Encounter: Payer: Self-pay | Admitting: Physical Therapy

## 2021-02-24 ENCOUNTER — Ambulatory Visit: Payer: Medicaid Other | Admitting: Physical Therapy

## 2021-02-24 DIAGNOSIS — R293 Abnormal posture: Secondary | ICD-10-CM

## 2021-02-24 DIAGNOSIS — R278 Other lack of coordination: Secondary | ICD-10-CM | POA: Diagnosis not present

## 2021-02-24 DIAGNOSIS — M6281 Muscle weakness (generalized): Secondary | ICD-10-CM | POA: Diagnosis not present

## 2021-02-24 DIAGNOSIS — G8929 Other chronic pain: Secondary | ICD-10-CM | POA: Diagnosis not present

## 2021-02-24 DIAGNOSIS — M546 Pain in thoracic spine: Secondary | ICD-10-CM | POA: Diagnosis not present

## 2021-02-24 DIAGNOSIS — M545 Low back pain, unspecified: Secondary | ICD-10-CM | POA: Diagnosis not present

## 2021-02-24 NOTE — Therapy (Signed)
OUTPATIENT PHYSICAL THERAPY TREATMENT NOTE   Patient Name: Emily Saunders MRN: [id number] DOB:08-May-1975, 46 y.o., female Today's Date: 02/24/2021  PCP: [name], NP REFERRING PROVIDER: Cindra Presume, MD   PT End of Session - 02/24/21 1102     Visit Number 4    Number of Visits 10    Date for PT Re-Evaluation 03/11/21    Authorization Type [employer] Access    PT Start Time 1103    PT Stop Time 8768    PT Time Calculation (min) 42 min             Past Medical History:  Diagnosis Date   GERD (gastroesophageal reflux disease)    H/O pilonidal cyst 2002    History of hiatal hernia    Hypertension    PID (acute pelvic inflammatory disease) 2006    Pneumonia 2016   Vitamin D deficiency 2015    Wears dentures    FULL SET   Past Surgical History:  Procedure Laterality Date   ANTERIOR AND POSTERIOR REPAIR N/A 06/08/2020   Procedure: ANTERIOR (CYSTOCELE) AND POSTERIOR REPAIR (RECTOCELE) with perineorrhaphy;  Surgeon: Jaquita Folds, MD;  Location: [place];  Service: Gynecology;  Laterality: N/A;   CESAREAN SECTION     X 1   CYST EXCISION Right 2003   hand   CYSTOSCOPY N/A 06/08/2020   Procedure: CYSTOSCOPY;  Surgeon: Jaquita Folds, MD;  Location[facility];  Service: Gynecology;  Laterality: N/A;   NASAL SINUS SURGERY Bilateral 02/10/2020   Procedure: ENDOSCOPIC SINUS SURGERY Maxillary Anstrostomy with Tissue Removal;  Surgeon: Izora Gala, MD;  Location: [place];  Service: ENT;  Laterality: Bilateral;   VAGINAL HYSTERECTOMY N/A 06/08/2020   Procedure: HYSTERECTOMY VAGINAL with bilateral salpingectomy;  Surgeon: Jaquita Folds, MD;  Location: [facility];  Service: Gynecology;  Laterality: N/A;   VAGINAL PROLAPSE REPAIR N/A 06/08/2020   Procedure: UTEROSACRAL SUSPENSION;  Surgeon: Jaquita Folds, MD;  Location: [facility];  Service: Gynecology;  Laterality:  N/A;   Patient Active Problem List   Diagnosis Date Noted   Symptomatic cholelithiasis 10/13/2020   Pain in left knee 09/11/2019   HTN (hypertension) 02/27/2015   Unintended weight loss 10/27/2014   Allergic rhinitis 06/23/2014   Vitamin D insufficiency 03/03/2014   Pilonidal cyst 03/03/2014   Broken teeth 03/03/2014   Poor sleep pattern 03/03/2014   Obesity (BMI 30-39.9) 03/03/2014   Former smoker 03/03/2014   History of anemia 05/27/2013    PCP: [name], NP   REFERRING PROVIDER: Mingo Amber, MD   REFERRING DIAG: 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 When pt began developing breast is when back pain began.   PRECAUTIONS: None   WEIGHT BEARING RESTRICTIONS No  SUBJECTIVE: I feel better after the exercises and then the pain always comes back.   PAIN:  Are you having pain? Yes VAS scale: 5/10 Pain location: mid back Pain orientation: Bilateral and Posterior  PAIN TYPE: aching Pain description: constant  Aggravating factors: sitting Relieving factors: prone     OBJECTIVE:    DIAGNOSTIC FINDINGS:  MRI L shoulder ordered; L shoulder XRay 09/30/20 with no significant findings     SCREENING FOR RED FLAGS: Bowel or bladder  incontinence: No Spinal tumors: No Cauda equina syndrome: No Compression fracture: No Abdominal aneurysm: No   COGNITION:          Overall cognitive status: Within functional limits for tasks assessed                        SENSATION:          Light touch: Appears intact             POSTURE:  Rounded shoulders bil L > R, R Upper Trap tighter, thoracic kyphosis, L scapula elevated; iliac crest height even, gluteal fold/popliteal fossa equal   PALPATION: ASIS =, T7-T10 TTP with T9/10 hypomobile; Lumbar  paraspinals bil tight and TTP, R glute tigher than L, sacral border =   LUMBARAROM/PROM   A/PROM A/PROM  02/04/2021 AROM 02/24/21  Flexion WNL WNL  Extension Limited 75% Limited  50%, pain  Right lateral flexion WNL   Left lateral flexion WNL   Right rotation Limited 50% Limited  50% ,pain  Left rotation Limited 50% Limited  50%, pain   (Blank rows = not tested): extension with R lumbar pain   Cervical AROM: All WNL and =                       Shoulder AROM: All WNL and =                       Hip limited PROM R flexion with tightness palpated but all others WNL.    LE MMT:   MMT Right 02/04/2021 Left 02/04/2021  Hip flexion 4+/5 4+/5  Hip extension      Hip abduction 4+/5 4+/5  Hip adduction 4+/5 4+/5  Hip internal rotation      Hip external rotation      Knee flexion      Knee extension      Ankle dorsiflexion      Ankle plantarflexion      Ankle inversion      Ankle eversion       (Blank rows = not tested)   UE MMT: IR 5/5, ER bil 3+/5, Mid Trap L 3+/5 and R 4+/5; shoulder ext bil 3+/5  TODAY's Treamtent        02/24/21 Therapeutic Exercise  -Nustep L6 x 5 min  -Standing rows with BTB 15 x 2  -Standing shoulder extension GTB 15 x 2   -Standing star pattern with GTB (horizontal and diagonals) x 10  -QL/side stretch in doorway   -post capsule stretch bilat   -bird dogs -x 5 each, began to have increased pain.   -supine bilat horizontal abduction green, ER x15 each   -bridge x 10  with green band chest pull x 10   -Doorway pec stretch  - childs pose with reach through x 2 each 10 sec   -thoracic extension over yoga mat - 2 positions   -POE x 30 sec   Treamtent        02/19/21 Therapeutic Exercise  -Nustep L5 x 5 min  -Standing rows with BTB 15 x 2  -Standing shoulder extension GTB 15 x 2   -Standing star pattern with GTB (horizontal and diagonals) x 10  -supine bilat horizontal abduction green, ER x15 each   -bridge x 10   -Doorway pec stretch    -Side lying open books   -cat/camel  - childs pose with reach through x 2  each 10 sec   -thoracic extension over soft foam roller - arm over head x 2 minutes   -thoracic extension in chair- hands behind head vs hands crossed on chest    Treatment        02/17/21 Therapeutic Exercise  -Nustep L5 x 5 min  -Standing rows with GTB 15 x 2  -Standing star pattern with red band (horizontal and diagonals)   -Doorway pec stretch   -Side lying open books   -cat/camel  -thoracic extension over soft foam roller - arm over head x 2 minutes         PATIENT EDUCATION:  Education details: continue HEP  Person educated: Patient Education method: Explanation, Demonstration, and Handouts Education comprehension: verbalized understanding and returned demonstration     HOME EXERCISE PROGRAM: Access Code: [email or web address] URL: [email or web address] Date: 02/17/2021 Prepared by: Hessie Diener  Exercises Seated Scapular Retraction - 2 x daily - 7 x weekly - 1 sets - 15-20 reps Alternating star pattern - 1 x daily - 7 x weekly - 2-3 sets - 10 reps Standing Low Shoulder Row with Anchored Resistance - 1 x daily - 7 x weekly - 3 sets - 10 reps Sidelying Open Book Thoracic Lumbar Rotation and Extension - 1 x daily - 7 x weekly - 1 sets - 5 reps - 10-20 hold    ASSESSMENT:   CLINICAL IMPRESSION: Patient reports pain is not as constant. She sometimes wakes up without pain. No change in sitting and resting in bed tolerance. She does report feeling better after stretches however the pain returns. She is compliant with consistent with HEP thus far. STG# 1 met. Her trunk ROM is slightly improved but with pain. Continued with postural stretching and strengthening with progression of core strength. Will continued to progress as tolerated.    REHAB POTENTIAL: Good   CLINICAL DECISION MAKING: Stable/uncomplicated   EVALUATION COMPLEXITY: Low     GOALS: Goals reviewed with patient? Yes    SHORT TERM GOALS:   STG Name Target Date Goal status  1 Pt will be indep with initial HEP or thoracolumbar pain management Baseline: initiated at evaluation 02/18/2021 MET 02/24/21  2 Pt will report decreased thoracic pain with postural awareness and using HEP to assist with sitting x 25% Baseline: 0%; update 02/24/21- pain is more intermittent -still present every day  02/18/2021 ONGOING 02/24/21    LONG TERM GOALS:    LTG Name Target Date Goal status  1 Pt will be independent with advanced HEP for back pain management. Baseline:initiated at eval 03/11/21 INITIAL  2 Pt will be able to sit on the floor x 30 min with </= 3/10 thoracic pain Baseline:up to 6/10 03/11/21 INITIAL  3 Pt will be able to report decreased LBP to present < 3x weekly with functional mobility Baseline:intermittently throughout the week up to 5x 03/11/21 INITIAL  4 Pt will demo improved ER/mid thoracic MMT >/= 1 MMT grade to assist with posture when sitting Baseline: ER bil 3+/5, Mid Trap L 3+/5 and R 4+/5; shoulder ext bil 3+/5 03/11/21 INITIAL  5 Pt will report improvement in resting in bed at night x 50% Baseline:unable 03/11/21 INITIAL    PLAN: PT FREQUENCY: 2x/week   PT DURATION: other: 5 weeks   PLANNED INTERVENTIONS: Therapeutic exercises, Therapeutic activity, Neuro Muscular re-education, Balance training, Gait training, Patient/Family education, Joint mobilization, Dry Needling, Electrical stimulation, Spinal mobilization, Cryotherapy, Moist heat, scar mobilization, Taping, Ultrasound, and Manual therapy   PLAN FOR NEXT SESSION: ReviewHEP, consider  Qped/ Prone, spinal mobs T7-10, thoracolumbar strengthening,  progress pelvic core therex, sitting posture education, consider foam roller routine with bands       Hessie Diener, PTA 02/24/21 12:06 PM Phone: [phone] Fax: [phone]

## 2021-02-25 DIAGNOSIS — J3089 Other allergic rhinitis: Secondary | ICD-10-CM | POA: Diagnosis not present

## 2021-02-26 NOTE — Therapy (Signed)
OUTPATIENT PHYSICAL THERAPY TREATMENT NOTE   Patient Name: Emily Saunders MRN: [id number] DOB:[birth year], 46 y.o., female Today's Date: 02/27/2021  PCP: [name], NP REFERRING PROVIDER: Cindra Presume, MD   PT End of Session - 02/27/21 [phone]     Visit Number 5    Number of Visits 10    Date for PT Re-Evaluation 03/11/21    Authorization Type [place] Access    Progress Note Due on Visit 10    PT Start Time 0905    PT Stop Time 0945    PT Time Calculation (min) 40 min    Activity Tolerance Patient tolerated treatment well;No increased pain    Behavior During Therapy [facility] for tasks assessed/performed              Past Medical History:  Diagnosis Date   GERD (gastroesophageal reflux disease)    H/O pilonidal cyst 2002    History of hiatal hernia    Hypertension    PID (acute pelvic inflammatory disease) 2006    Pneumonia 2016   Vitamin D deficiency 2015    Wears dentures    FULL SET   Past Surgical History:  Procedure Laterality Date   ANTERIOR AND POSTERIOR REPAIR N/A 06/08/2020   Procedure: ANTERIOR (CYSTOCELE) AND POSTERIOR REPAIR (RECTOCELE) with perineorrhaphy;  Surgeon: Jaquita Folds, MD;  Location: [place];  Service: Gynecology;  Laterality: N/A;   CESAREAN SECTION     X 1   CYST EXCISION Right 2003   hand   CYSTOSCOPY N/A 06/08/2020   Procedure: CYSTOSCOPY;  Surgeon: Jaquita Folds, MD;  Location: [facility];  Service: Gynecology;  Laterality: N/A;   NASAL SINUS SURGERY Bilateral 02/10/2020   Procedure: ENDOSCOPIC SINUS SURGERY Maxillary Anstrostomy with Tissue Removal;  Surgeon: Izora Gala, MD;  Location: [place];  Service: ENT;  Laterality: Bilateral;   VAGINAL HYSTERECTOMY N/A 06/08/2020   Procedure: HYSTERECTOMY VAGINAL with bilateral salpingectomy;  Surgeon: Jaquita Folds, MD;  Location: [facility];  Service: Gynecology;  Laterality: N/A;   VAGINAL  PROLAPSE REPAIR N/A 06/08/2020   Procedure: UTEROSACRAL SUSPENSION;  Surgeon: Jaquita Folds, MD;  Location: [facility];  Service: Gynecology;  Laterality: N/A;   Patient Active Problem List   Diagnosis Date Noted   Symptomatic cholelithiasis 10/13/2020   Pain in left knee 09/11/2019   HTN (hypertension) 02/27/2015   Unintended weight loss 10/27/2014   Allergic rhinitis 06/23/2014   Vitamin D insufficiency 03/03/2014   Pilonidal cyst 03/03/2014   Broken teeth 03/03/2014   Poor sleep pattern 03/03/2014   Obesity (BMI 30-39.9) 03/03/2014   Former smoker 03/03/2014   History of anemia 05/27/2013    PCP: [name], NP   REFERRING PROVIDER: Mingo Amber, MD   REFERRING DIAG: 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 When pt began developing breast is when back pain began.   PRECAUTIONS: None   WEIGHT BEARING RESTRICTIONS No  SUBJECTIVE: Pt reports continued mid and low back pain today. She reports HEP adherence, reporting no trouble with her home exercises.  PAIN:  Are you having pain? Yes VAS scale: 7/10 Pain location: mid back Pain orientation: Bilateral  and Posterior  PAIN TYPE: aching Pain description: constant  Aggravating factors: sitting Relieving factors: prone     OBJECTIVE:   *Unless otherwise noted, objective information collected previously* DIAGNOSTIC FINDINGS:  MRI L shoulder ordered; L shoulder XRay 09/30/20 with no significant findings     SCREENING FOR RED FLAGS: Bowel or bladder incontinence: No Spinal tumors: No Cauda equina syndrome: No Compression fracture: No Abdominal aneurysm: No   COGNITION:          Overall  cognitive status: Within functional limits for tasks assessed                        SENSATION:          Light touch: Appears intact             POSTURE:  Rounded shoulders bil L > R, R Upper Trap tighter, thoracic kyphosis, L scapula elevated; iliac crest height even, gluteal fold/popliteal fossa equal   PALPATION: ASIS =, T7-T10 TTP with T9/10 hypomobile; Lumbar paraspinals bil tight and TTP, R glute tigher than L, sacral border =   LUMBARAROM/PROM   A/PROM A/PROM  02/04/2021 AROM 02/24/21  Flexion WNL WNL  Extension Limited 75% Limited  50%, pain  Right lateral flexion WNL   Left lateral flexion WNL   Right rotation Limited 50% Limited  50% ,pain  Left rotation Limited 50% Limited  50%, pain   (Blank rows = not tested): extension with R lumbar pain   Cervical AROM: All WNL and =                       Shoulder AROM: All WNL and =                       Hip limited PROM R flexion with tightness palpated but all others WNL.    LE MMT:   MMT Right 02/04/2021 Left 02/04/2021  Hip flexion 4+/5 4+/5  Hip extension      Hip abduction 4+/5 4+/5  Hip adduction 4+/5 4+/5  Hip internal rotation      Hip external rotation      Knee flexion      Knee extension      Ankle dorsiflexion      Ankle plantarflexion      Ankle inversion      Ankle eversion       (Blank rows = not tested)   UE MMT: IR 5/5, ER bil 3+/5, Mid Trap L 3+/5 and R 4+/5; shoulder ext bil 3+/5  TODAY's Treamtent  [facility] Adult PT Treatment:                                                DATE: 02/27/2021 Therapeutic Exercise: Seated low rows with 25# cable 2x10 Seated high rows with 25# cable 2x10 Seated lat pull-downs with 25# cable 2x10 Seated forward and backward shoulder rolls 2x10 each Standing low trap lift-off at wall 3x10 with 3-sec holds Supine thoracic foam rolling x10 Corner pec stretch 2x1 minute Standard plank 3x to exhaustion Manual Therapy: Effleurage and tapotement to BIL mid trap/  rhomboids Neuromuscular re-ed: N/A Therapeutic Activity: N/A Modalities: N/A Self Care: N/A          02/24/21 Therapeutic Exercise  -Nustep L6 x 5 min  -Standing  rows with BTB 15 x 2  -Standing shoulder extension GTB 15 x 2   -Standing star pattern with GTB (horizontal and diagonals) x 10  -QL/side stretch in doorway   -post capsule stretch bilat   -bird dogs -x 5 each, began to have increased pain.   -supine bilat horizontal abduction green, ER x15 each   -bridge x 10  with green band chest pull x 10   -Doorway pec stretch  - childs pose with reach through x 2 each 10 sec   -thoracic extension over yoga mat - 2 positions   -POE x 30 sec   Treamtent        02/19/21 Therapeutic Exercise  -Nustep L5 x 5 min  -Standing rows with BTB 15 x 2  -Standing shoulder extension GTB 15 x 2   -Standing star pattern with GTB (horizontal and diagonals) x 10  -supine bilat horizontal abduction green, ER x15 each   -bridge x 10   -Doorway pec stretch   -Side lying open books   -cat/camel  - childs pose with reach through x 2 each 10 sec   -thoracic extension over soft foam roller - arm over head x 2 minutes   -thoracic extension in chair- hands behind head vs hands crossed on chest    Treatment        02/17/21 Therapeutic Exercise  -Nustep L5 x 5 min  -Standing rows with GTB 15 x 2  -Standing star pattern with red band (horizontal and diagonals)   -Doorway pec stretch   -Side lying open books   -cat/camel  -thoracic extension over soft foam roller - arm over head x 2 minutes         PATIENT EDUCATION:  Education details: Updated HEP  Person educated: Patient Education method: Explanation, Demonstration, and Handouts Education comprehension: verbalized understanding and returned demonstration     HOME EXERCISE PROGRAM: Access Code: [email or web address] URL: [email or web address] Date: 02/17/2021 Prepared by: Hessie Diener  Exercises Seated Scapular Retraction - 2 x  daily - 7 x weekly - 1 sets - 15-20 reps Alternating star pattern - 1 x daily - 7 x weekly - 2-3 sets - 10 reps Standing Low Shoulder Row with Anchored Resistance - 1 x daily - 7 x weekly - 3 sets - 10 reps Sidelying Open Book Thoracic Lumbar Rotation and Extension - 1 x daily - 7 x weekly - 1 sets - 5 reps - 10-20 hold  Added 02/27/2021: Thoracic Extension Mobilization on Foam Roll - 1 x daily - 7 x weekly - 10 reps Low trap slides at wall with lift-off - 1 x daily - 7 x weekly - 2 sets - 10 reps - 3-sec hold   ASSESSMENT:   CLINICAL IMPRESSION: Pt responded well to all interventions today, demonstrating good form and no increase in pain with selected exercises. She reports a therapeutic response to manual therapy, reporting decrease in pain from 7/10 to 5/10 following this interventions. She tolerated new exercises well. She will continue to benefit from skilled PT to address her primary impairments and return to her prior level of function with less limitation.   REHAB POTENTIAL: Good   CLINICAL DECISION MAKING: Stable/uncomplicated   EVALUATION COMPLEXITY: Low     GOALS: Goals reviewed with patient? Yes   SHORT TERM GOALS:   STG Name Target Date Goal status  1 Pt will be indep with initial HEP or thoracolumbar pain management Baseline: initiated at evaluation 02/18/2021 MET 02/24/21  2 Pt will  report decreased thoracic pain with postural awareness and using HEP to assist with sitting x 25% Baseline: 0%; update 02/24/21- pain is more intermittent -still present every day  02/18/2021 ONGOING 02/24/21    LONG TERM GOALS:    LTG Name Target Date Goal status  1 Pt will be independent with advanced HEP for back pain management. Baseline:initiated at eval 03/11/21 INITIAL  2 Pt will be able to sit on the floor x 30 min with </= 3/10 thoracic pain Baseline:up to 6/10 03/11/21 INITIAL  3 Pt will be able to report decreased LBP to present < 3x weekly with functional  mobility Baseline:intermittently throughout the week up to 5x 03/11/21 INITIAL  4 Pt will demo improved ER/mid thoracic MMT >/= 1 MMT grade to assist with posture when sitting Baseline: ER bil 3+/5, Mid Trap L 3+/5 and R 4+/5; shoulder ext bil 3+/5 03/11/21 INITIAL  5 Pt will report improvement in resting in bed at night x 50% Baseline:unable 03/11/21 INITIAL    PLAN: PT FREQUENCY: 2x/week   PT DURATION: other: 5 weeks   PLANNED INTERVENTIONS: Therapeutic exercises, Therapeutic activity, Neuro Muscular re-education, Balance training, Gait training, Patient/Family education, Joint mobilization, Dry Needling, Electrical stimulation, Spinal mobilization, Cryotherapy, Moist heat, scar mobilization, Taping, Ultrasound, and Manual therapy   PLAN FOR NEXT SESSION: ReviewHEP, consider Qped/ Prone, spinal mobs T7-10, thoracolumbar strengthening,  progress pelvic core therex, sitting posture education, consider foam roller routine with bands       Vanessa [place], PT, DPT 02/27/21 9:43 AM

## 2021-02-27 ENCOUNTER — Ambulatory Visit: Payer: Medicaid Other

## 2021-02-27 ENCOUNTER — Other Ambulatory Visit: Payer: Self-pay

## 2021-02-27 ENCOUNTER — Encounter: Payer: Medicaid Other | Admitting: Rehabilitative and Restorative Service Providers"

## 2021-02-27 DIAGNOSIS — R278 Other lack of coordination: Secondary | ICD-10-CM

## 2021-02-27 DIAGNOSIS — G8929 Other chronic pain: Secondary | ICD-10-CM | POA: Diagnosis not present

## 2021-02-27 DIAGNOSIS — R293 Abnormal posture: Secondary | ICD-10-CM

## 2021-02-27 DIAGNOSIS — M545 Low back pain, unspecified: Secondary | ICD-10-CM | POA: Diagnosis not present

## 2021-02-27 DIAGNOSIS — M546 Pain in thoracic spine: Secondary | ICD-10-CM | POA: Diagnosis not present

## 2021-02-27 DIAGNOSIS — M6281 Muscle weakness (generalized): Secondary | ICD-10-CM | POA: Diagnosis not present

## 2021-03-03 ENCOUNTER — Ambulatory Visit: Payer: Medicaid Other | Admitting: Physical Therapy

## 2021-03-03 ENCOUNTER — Encounter: Payer: Self-pay | Admitting: Physical Therapy

## 2021-03-03 ENCOUNTER — Other Ambulatory Visit: Payer: Self-pay

## 2021-03-03 DIAGNOSIS — G8929 Other chronic pain: Secondary | ICD-10-CM | POA: Diagnosis not present

## 2021-03-03 DIAGNOSIS — M545 Low back pain, unspecified: Secondary | ICD-10-CM | POA: Diagnosis not present

## 2021-03-03 DIAGNOSIS — M546 Pain in thoracic spine: Secondary | ICD-10-CM | POA: Diagnosis not present

## 2021-03-03 DIAGNOSIS — R293 Abnormal posture: Secondary | ICD-10-CM

## 2021-03-03 DIAGNOSIS — M6281 Muscle weakness (generalized): Secondary | ICD-10-CM | POA: Diagnosis not present

## 2021-03-03 DIAGNOSIS — R278 Other lack of coordination: Secondary | ICD-10-CM | POA: Diagnosis not present

## 2021-03-03 NOTE — Therapy (Addendum)
OUTPATIENT PHYSICAL THERAPY TREATMENT NOTE/ DISCHARGE SUMMARY   Patient Name: Emily Saunders MRN: [id number] DOB:Dec 17, 1975, 46 y.o., female Today's Date: 03/03/2021  PCP: [name], NP REFERRING PROVIDER: Cindra Presume, MD   PT End of Session - 03/03/21 1104     Visit Number 6    Number of Visits 10    Date for PT Re-Evaluation 03/11/21    Authorization Type [place] Access    Authorization Time Period 02/13/21-03/20/21    PT Start Time 92    PT Stop Time 6237    PT Time Calculation (min) 43 min             Past Medical History:  Diagnosis Date   GERD (gastroesophageal reflux disease)    H/O pilonidal cyst 2002    History of hiatal hernia    Hypertension    PID (acute pelvic inflammatory disease) 2006    Pneumonia 2016   Vitamin D deficiency 2015    Wears dentures    FULL SET   Past Surgical History:  Procedure Laterality Date   ANTERIOR AND POSTERIOR REPAIR N/A 06/08/2020   Procedure: ANTERIOR (CYSTOCELE) AND POSTERIOR REPAIR (RECTOCELE) with perineorrhaphy;  Surgeon: Jaquita Folds, MD;  Location: [place];  Service: Gynecology;  Laterality: N/A;   CESAREAN SECTION     X 1   CYST EXCISION Right 2003   hand   CYSTOSCOPY N/A 06/08/2020   Procedure: CYSTOSCOPY;  Surgeon: Jaquita Folds, MD;  Location: [facility];  Service: Gynecology;  Laterality: N/A;   NASAL SINUS SURGERY Bilateral 02/10/2020   Procedure: ENDOSCOPIC SINUS SURGERY Maxillary Anstrostomy with Tissue Removal;  Surgeon: Izora Gala, MD;  Location: [place];  Service: ENT;  Laterality: Bilateral;   VAGINAL HYSTERECTOMY N/A 06/08/2020   Procedure: HYSTERECTOMY VAGINAL with bilateral salpingectomy;  Surgeon: Jaquita Folds, MD;  Location: [facility];  Service: Gynecology;  Laterality: N/A;   VAGINAL PROLAPSE REPAIR N/A 06/08/2020   Procedure: UTEROSACRAL SUSPENSION;  Surgeon: Jaquita Folds, MD;  [facility];  Service: Gynecology;  Laterality: N/A;   Patient Active Problem List   Diagnosis Date Noted   Symptomatic cholelithiasis 10/13/2020   Pain in left knee 09/11/2019   HTN (hypertension) 02/27/2015   Unintended weight loss 10/27/2014   Allergic rhinitis 06/23/2014   Vitamin D insufficiency 03/03/2014   Pilonidal cyst 03/03/2014   Broken teeth 03/03/2014   Poor sleep pattern 03/03/2014   Obesity (BMI 30-39.9) 03/03/2014   Former smoker 03/03/2014   History of anemia 05/27/2013    PCP: [name], NP   REFERRING PROVIDER: Mingo Amber, MD   REFERRING DIAG: 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 When pt began developing breast is when back pain began.   PRECAUTIONS: None   WEIGHT BEARING RESTRICTIONS No  SUBJECTIVE: I have been hurting more the last couple of days.   PAIN:  Are you having pain? Yes VAS scale: 7/10 Pain location: mid back Pain orientation: Bilateral and Posterior  PAIN TYPE: aching Pain description: constant  Aggravating factors: sitting Relieving factors: prone     OBJECTIVE:    DIAGNOSTIC FINDINGS:  MRI L shoulder ordered; L shoulder XRay 09/30/20 with no significant findings     SCREENING  FOR RED FLAGS: Bowel or bladder incontinence: No Spinal tumors: No Cauda equina syndrome: No Compression fracture: No Abdominal aneurysm: No   COGNITION:          Overall cognitive status: Within functional limits for tasks assessed                        SENSATION:          Light touch: Appears intact             POSTURE:  Rounded shoulders bil L > R, R Upper Trap tighter, thoracic kyphosis, L scapula elevated; iliac crest height even, gluteal fold/popliteal fossa equal   PALPATION: ASIS =,  T7-T10 TTP with T9/10 hypomobile; Lumbar paraspinals bil tight and TTP, R glute tigher than L, sacral border =   LUMBARAROM/PROM   A/PROM A/PROM  02/04/2021 AROM 02/24/21 AROM 03/03/21  Flexion WNL WNL WNL  Extension Limited 75% Limited  50%, pain Limited  50%, pain  Right lateral flexion WNL  WNL  Left lateral flexion WNL  WNL  Right rotation Limited 50% Limited  50% ,pain Limited 50%, pain  Left rotation Limited 50% Limited  50%, pain Limited 50%   (Blank rows = not tested): extension with R lumbar pain   Cervical AROM: All WNL and =                       Shoulder AROM: All WNL and =                       Hip limited PROM R flexion with tightness palpated but all others WNL.    LE MMT:   MMT Right 02/04/2021 Left 02/04/2021  Hip flexion 4+/5 4+/5  Hip extension      Hip abduction 4+/5 4+/5  Hip adduction 4+/5 4+/5  Hip internal rotation      Hip external rotation      Knee flexion      Knee extension      Ankle dorsiflexion      Ankle plantarflexion      Ankle inversion      Ankle eversion           (Blank rows = not tested)   UE MMT: IR 5/5, ER bil 3+/5, Mid Trap L 3+/5 and R 4+/5; shoulder ext bil 3+/5 UE MMT (03/03/21): IR 5/5, bilat ER 4+/5, Bilat Mid Trap 4/5, Bilat Shoulder ext 4/5  Today's Treamtent        03/03/21 Therapeutic Exercise  -Nustep L5 x 5 min  -Standing rows with BTB 15 x 2  -Standing shoulder extension GTB 15 x 2   -Standing star pattern with GTB (horizontal and diagonals) x 10  -thoracic extension over half foam roller - 2 positions      Treamtent        02/24/21 Therapeutic Exercise  -Nustep L6 x 5 min  -Standing rows with BTB 15 x 2  -Standing shoulder extension GTB 15 x 2   -Standing star pattern with GTB (horizontal and diagonals) x 10  -QL/side stretch in doorway   -post capsule stretch bilat   -bird dogs -x 5 each, began to have increased pain.   -supine bilat horizontal abduction green, ER x15 each   -bridge x 10  with  green band chest pull x 10   -Doorway pec stretch  - childs pose with reach through x 2 each 10 sec   -  thoracic extension over yoga mat - 2 positions   -POE x 30 sec   Treamtent        02/19/21 Therapeutic Exercise  -Nustep L5 x 5 min  -Standing rows with BTB 15 x 2  -Standing shoulder extension GTB 15 x 2   -Standing star pattern with GTB (horizontal and diagonals) x 10  -supine bilat horizontal abduction green, ER x15 each   -bridge x 10   -Doorway pec stretch   -Side lying open books   -cat/camel  - childs pose with reach through x 2 each 10 sec   -thoracic extension over soft foam roller - arm over head x 2 minutes   -thoracic extension in chair- hands behind head vs hands crossed on chest        PATIENT EDUCATION:  Education details: continue HEP  Person educated: Patient Education method: Explanation, Demonstration, and Handouts Education comprehension: verbalized understanding and returned demonstration     HOME EXERCISE PROGRAM: Access Code: [email or web address] URL: [email or web address] Date: 02/17/2021 Prepared by: Hessie Diener  Exercises Seated Scapular Retraction - 2 x daily - 7 x weekly - 1 sets - 15-20 reps Alternating star pattern - 1 x daily - 7 x weekly - 2-3 sets - 10 reps Standing Low Shoulder Row with Anchored Resistance - 1 x daily - 7 x weekly - 3 sets - 10 reps Sidelying Open Book Thoracic Lumbar Rotation and Extension - 1 x daily - 7 x weekly - 1 sets - 5 reps - 10-20 hold    ASSESSMENT:   CLINICAL IMPRESSION: Patient reports pain is not as constant however still present everyday. Overall she reports no significant improvement in pain. She demonstrates improved scapular and RTC strength. Her trunk ROM has improved. She sometimes wakes up without pain. No change in sitting and resting in bed tolerance. She does report feeling better after stretches however the pain returns. She is compliant with consistent with HEP thus far. Her trunk ROM is  slightly improved but with pain. Reviewed HEP for discharge today.  She will return to MD next week for follow up and discuss further treatment options.   REHAB POTENTIAL: Good   CLINICAL DECISION MAKING: Stable/uncomplicated   EVALUATION COMPLEXITY: Low     GOALS: Goals reviewed with patient? Yes   SHORT TERM GOALS:   STG Name Target Date Goal status  1 Pt will be indep with initial HEP or thoracolumbar pain management Baseline: initiated at evaluation 02/18/2021 MET 02/24/21  2 Pt will report decreased thoracic pain with postural awareness and using HEP to assist with sitting x 25% Baseline: 0%; update 02/24/21- pain is more intermittent -still present every day ; 03/03/21 pain is more intermittent -still present every day 02/18/2021 Partially MET 03/03/21    LONG TERM GOALS:    LTG Name Target Date Goal status  1 Pt will be independent with advanced HEP for back pain management. Baseline:initiated at eval 03/11/21 MET  2 Pt will be able to sit on the floor x 30 min with </= 3/10 thoracic pain Baseline:up to 6/10; /25/23: has not completed task in last 4 weeks 03/11/21 Unable to Assess  3 Pt will be able to report decreased LBP to present < 3x weekly with functional mobility Baseline:intermittently throughout the week up to 5x Status 03/03/21 has pain everday, 7 days 03/11/21 NOT MET  4 Pt will demo improved ER/mid thoracic MMT >/= 1 MMT grade to assist with posture when sitting Baseline: ER bil 3+/5, Mid Trap L  3+/5 and R 4+/5; shoulder ext bil 3+/5 03/11/21 Partially MET  5 Pt will report improvement in resting in bed at night x 50% Baseline:unable; status: has improvement in pain after therex before bed, but pain returns 03/11/21 Partially MET    PLAN: PT FREQUENCY: 2x/week   PT DURATION: other: 5 weeks   PLANNED INTERVENTIONS: Therapeutic exercises, Therapeutic activity, Neuro Muscular re-education, Balance training, Gait training, Patient/Family education, Joint mobilization, Dry  Needling, Electrical stimulation, Spinal mobilization, Cryotherapy, Moist heat, scar mobilization, Taping, Ultrasound, and Manual therapy   PLAN FOR NEXT SESSION: ReviewHEP, consider Qped/ Prone, spinal mobs T7-10, thoracolumbar strengthening,  progress pelvic core therex, sitting posture education, consider foam roller routine with bands       Hessie Diener, PTA 03/03/21 11:55 AM Phone: [phone] Fax: [phone]       PHYSICAL THERAPY DISCHARGE SUMMARY  Visits from Start of Care: 6  Current functional level related to goals / functional outcomes: Pt made menial improvements in parascapular strength and rate of sleep dysfunction.    Remaining deficits: Pt continues to have high levels of pain and gross muscle weakness.   Education / Equipment: HEP   Patient agrees to discharge. Patient goals were not met. Patient is being discharged due to not returning since the last visit.  Vanessa [place], PT, DPT 10/08/21 11:44 AM

## 2021-03-05 ENCOUNTER — Ambulatory Visit: Payer: Medicaid Other | Admitting: Physical Therapy

## 2021-03-08 NOTE — Progress Notes (Signed)
° °  Referring Provider [name], NP [address] [place],  [place] [address]   CC:  Chief Complaint  Patient presents with   Follow-up      Emily Saunders is an 46 y.o. female.  HPI: Patient is a 46 y.o. year old female here for follow up after completing physical therapy for pain related to macromastia.   Patient was seen for initial consult 12/09/2020.  At that time, she complained of chronic back and neck discomfort in the context of large breasts.  She reported that she was a J cup and wants to be DD cup.  She did admit to intermittent vape use, but no longer smokes tobacco.  She expressed interest in surgical intervention for her large breasts.  STN 40 cm on the right, 41 cm on the left.  Patient reported that she was not interested in free nipple graft due to definitive loss and nipple sensation.  She was informed that she would need to completely stop smoking nicotine products if she were to have surgery.  Estimated excess breast tissue removed from each side equals 900 g.  PT ordered.  Today, patient is still very much looking forward to surgical intervention for her macromastia.  She states that she had completed PT and denied any considerable improvement.  She still requires [facility] powders for her chronic back pain.  She also continues to experience inframammary rashes and "boils" refractory to medical management.  Once again discussed the importance of nicotine cessation between now and surgery.  She vapes with nicotine, used to smoke 1 to 2 pack/day.  She understands the importance, particularly given that she is hoping to avoid free nipple graft despite long sternal notch to nipple lengths.   Review of Systems General: Endorses back pain and inframammary rashes.  Denies recent infections or fevers.  Physical Exam Vitals with BMI 03/11/2021 12/09/2020 10/15/2020  Height - 5\' 6"  -  Weight 230 lbs 13 oz 214 lbs 13 oz -  BMI - 64.33 -  Systolic - 295 188  Diastolic - 82 78   Pulse - 73 57    General:  No acute distress,  Alert and oriented, Non-Toxic, Normal speech and affect Psych: Normal behavior and mood Respiratory: No increased work of breathing. MSK: Ambulatory.  Assessment/Plan  Patient is interested in pursuing surgical intervention for bilateral breast reduction. Patient has completed at least 6 weeks of physical therapy for pain related to macromastia.  Discussed with patient we would submit to insurance for authorization, discussed approval could take up to 6 weeks.   Krista Blue 03/11/2021, 11:02 AM

## 2021-03-11 ENCOUNTER — Other Ambulatory Visit: Payer: Self-pay

## 2021-03-11 ENCOUNTER — Ambulatory Visit: Payer: Medicaid Other | Admitting: Physician Assistant

## 2021-03-11 ENCOUNTER — Encounter: Payer: Self-pay | Admitting: Physician Assistant

## 2021-03-11 VITALS — Wt 230.8 lb

## 2021-03-11 DIAGNOSIS — N62 Hypertrophy of breast: Secondary | ICD-10-CM | POA: Diagnosis not present

## 2021-03-19 ENCOUNTER — Telehealth: Payer: Self-pay | Admitting: Plastic Surgery

## 2021-03-19 NOTE — Telephone Encounter (Signed)
Called patient to advise her that [employer] denied her surgery authorization due to the fact there has not been any breast trauma, she does not have breast disease. Patient is okay to receive quote and understands to let us know if she wants to proceed cosmetically.

## 2021-03-31 DIAGNOSIS — J3089 Other allergic rhinitis: Secondary | ICD-10-CM | POA: Diagnosis not present

## 2021-04-14 DIAGNOSIS — Z9109 Other allergy status, other than to drugs and biological substances: Secondary | ICD-10-CM | POA: Diagnosis not present

## 2021-04-20 DIAGNOSIS — J302 Other seasonal allergic rhinitis: Secondary | ICD-10-CM | POA: Diagnosis not present

## 2021-04-20 DIAGNOSIS — J3089 Other allergic rhinitis: Secondary | ICD-10-CM | POA: Diagnosis not present

## 2021-04-27 DIAGNOSIS — J302 Other seasonal allergic rhinitis: Secondary | ICD-10-CM | POA: Diagnosis not present

## 2021-04-27 DIAGNOSIS — J3089 Other allergic rhinitis: Secondary | ICD-10-CM | POA: Diagnosis not present

## 2021-05-04 DIAGNOSIS — J302 Other seasonal allergic rhinitis: Secondary | ICD-10-CM | POA: Diagnosis not present

## 2021-05-04 DIAGNOSIS — J3089 Other allergic rhinitis: Secondary | ICD-10-CM | POA: Diagnosis not present

## 2021-05-07 DIAGNOSIS — Z6834 Body mass index (BMI) 34.0-34.9, adult: Secondary | ICD-10-CM | POA: Diagnosis not present

## 2021-05-07 DIAGNOSIS — F17210 Nicotine dependence, cigarettes, uncomplicated: Secondary | ICD-10-CM | POA: Diagnosis not present

## 2021-05-07 DIAGNOSIS — E781 Pure hyperglyceridemia: Secondary | ICD-10-CM | POA: Diagnosis not present

## 2021-05-07 DIAGNOSIS — I10 Essential (primary) hypertension: Secondary | ICD-10-CM | POA: Diagnosis not present

## 2021-05-07 DIAGNOSIS — R7301 Impaired fasting glucose: Secondary | ICD-10-CM | POA: Diagnosis not present

## 2021-05-07 DIAGNOSIS — E559 Vitamin D deficiency, unspecified: Secondary | ICD-10-CM | POA: Diagnosis not present

## 2021-05-07 DIAGNOSIS — R635 Abnormal weight gain: Secondary | ICD-10-CM | POA: Diagnosis not present

## 2021-05-07 DIAGNOSIS — R7303 Prediabetes: Secondary | ICD-10-CM | POA: Diagnosis not present

## 2021-05-07 DIAGNOSIS — Z7289 Other problems related to lifestyle: Secondary | ICD-10-CM | POA: Diagnosis not present

## 2021-05-07 DIAGNOSIS — R632 Polyphagia: Secondary | ICD-10-CM | POA: Diagnosis not present

## 2021-05-11 DIAGNOSIS — J3089 Other allergic rhinitis: Secondary | ICD-10-CM | POA: Diagnosis not present

## 2021-05-11 DIAGNOSIS — J302 Other seasonal allergic rhinitis: Secondary | ICD-10-CM | POA: Diagnosis not present

## 2021-05-17 DIAGNOSIS — J3089 Other allergic rhinitis: Secondary | ICD-10-CM | POA: Diagnosis not present

## 2021-05-18 DIAGNOSIS — J3089 Other allergic rhinitis: Secondary | ICD-10-CM | POA: Diagnosis not present

## 2021-05-18 DIAGNOSIS — J302 Other seasonal allergic rhinitis: Secondary | ICD-10-CM | POA: Diagnosis not present

## 2021-05-25 DIAGNOSIS — J302 Other seasonal allergic rhinitis: Secondary | ICD-10-CM | POA: Diagnosis not present

## 2021-05-25 DIAGNOSIS — J3089 Other allergic rhinitis: Secondary | ICD-10-CM | POA: Diagnosis not present

## 2021-06-01 DIAGNOSIS — J302 Other seasonal allergic rhinitis: Secondary | ICD-10-CM | POA: Diagnosis not present

## 2021-06-01 DIAGNOSIS — J3089 Other allergic rhinitis: Secondary | ICD-10-CM | POA: Diagnosis not present

## 2021-06-08 DIAGNOSIS — J3089 Other allergic rhinitis: Secondary | ICD-10-CM | POA: Diagnosis not present

## 2021-06-08 DIAGNOSIS — J302 Other seasonal allergic rhinitis: Secondary | ICD-10-CM | POA: Diagnosis not present

## 2021-06-15 DIAGNOSIS — J3089 Other allergic rhinitis: Secondary | ICD-10-CM | POA: Diagnosis not present

## 2021-06-15 DIAGNOSIS — J302 Other seasonal allergic rhinitis: Secondary | ICD-10-CM | POA: Diagnosis not present

## 2021-06-22 DIAGNOSIS — J3089 Other allergic rhinitis: Secondary | ICD-10-CM | POA: Diagnosis not present

## 2021-06-22 DIAGNOSIS — J302 Other seasonal allergic rhinitis: Secondary | ICD-10-CM | POA: Diagnosis not present

## 2021-06-29 DIAGNOSIS — J3089 Other allergic rhinitis: Secondary | ICD-10-CM | POA: Diagnosis not present

## 2021-06-29 DIAGNOSIS — J302 Other seasonal allergic rhinitis: Secondary | ICD-10-CM | POA: Diagnosis not present

## 2021-07-13 DIAGNOSIS — J302 Other seasonal allergic rhinitis: Secondary | ICD-10-CM | POA: Diagnosis not present

## 2021-07-13 DIAGNOSIS — J3089 Other allergic rhinitis: Secondary | ICD-10-CM | POA: Diagnosis not present

## 2021-07-28 ENCOUNTER — Other Ambulatory Visit: Payer: Self-pay

## 2021-07-28 ENCOUNTER — Emergency Department (HOSPITAL_COMMUNITY)
Admission: EM | Admit: 2021-07-28 | Discharge: 2021-07-28 | Disposition: A | Discharge status: Home / Self Care | Payer: Medicaid Other | Attending: Emergency Medicine | Admitting: Emergency Medicine

## 2021-07-28 ENCOUNTER — Encounter (HOSPITAL_COMMUNITY): Payer: Self-pay

## 2021-07-28 DIAGNOSIS — R110 Nausea: Secondary | ICD-10-CM | POA: Diagnosis not present

## 2021-07-28 DIAGNOSIS — R1084 Generalized abdominal pain: Secondary | ICD-10-CM | POA: Diagnosis not present

## 2021-07-28 DIAGNOSIS — R519 Headache, unspecified: Secondary | ICD-10-CM | POA: Insufficient documentation

## 2021-07-28 DIAGNOSIS — R6883 Chills (without fever): Secondary | ICD-10-CM | POA: Diagnosis not present

## 2021-07-28 LAB — CBC WITH DIFFERENTIAL/PLATELET
Abs Immature Granulocytes: 0.04 10*3/uL (ref 0.00–0.07)
Basophils Absolute: 0 10*3/uL (ref 0.0–0.1)
Basophils Relative: 0 %
Eosinophils Absolute: 0.1 10*3/uL (ref 0.0–0.5)
Eosinophils Relative: 1 %
HCT: 39.8 % (ref 36.0–46.0)
Hemoglobin: 13.2 g/dL (ref 12.0–15.0)
Immature Granulocytes: 0 %
Lymphocytes Relative: 7 %
Lymphs Abs: 0.7 10*3/uL (ref 0.7–4.0)
MCH: 30.4 pg (ref 26.0–34.0)
MCHC: 33.2 g/dL (ref 30.0–36.0)
MCV: 91.7 fL (ref 80.0–100.0)
Monocytes Absolute: 0.3 10*3/uL (ref 0.1–1.0)
Monocytes Relative: 3 %
Neutro Abs: 8.8 10*3/uL — ABNORMAL HIGH (ref 1.7–7.7)
Neutrophils Relative %: 89 %
Platelets: 155 10*3/uL (ref 150–400)
RBC: 4.34 MIL/uL (ref 3.87–5.11)
RDW: 13.7 % (ref 11.5–15.5)
WBC: 9.9 10*3/uL (ref 4.0–10.5)
nRBC: 0 % (ref 0.0–0.2)

## 2021-07-28 LAB — URINALYSIS, ROUTINE W REFLEX MICROSCOPIC
Bilirubin Urine: NEGATIVE
Glucose, UA: NEGATIVE mg/dL
Ketones, ur: NEGATIVE mg/dL
Leukocytes,Ua: NEGATIVE
Nitrite: NEGATIVE
Protein, ur: NEGATIVE mg/dL
Specific Gravity, Urine: 1.021 (ref 1.005–1.030)
pH: 5 (ref 5.0–8.0)

## 2021-07-28 LAB — LIPASE, BLOOD: Lipase: 24 U/L (ref 11–51)

## 2021-07-28 LAB — COMPREHENSIVE METABOLIC PANEL
ALT: 19 U/L (ref 0–44)
AST: 25 U/L (ref 15–41)
Albumin: 4.2 g/dL (ref 3.5–5.0)
Alkaline Phosphatase: 34 U/L — ABNORMAL LOW (ref 38–126)
Anion gap: 10 (ref 5–15)
BUN: 14 mg/dL (ref 6–20)
CO2: 20 mmol/L — ABNORMAL LOW (ref 22–32)
Calcium: 8.9 mg/dL (ref 8.9–10.3)
Chloride: 108 mmol/L (ref 98–111)
Creatinine, Ser: 0.81 mg/dL (ref 0.44–1.00)
GFR, Estimated: >60 mL/min (ref >60)
Glucose, Bld: 102 mg/dL — ABNORMAL HIGH (ref 70–99)
Potassium: 3.5 mmol/L (ref 3.5–5.1)
Sodium: 138 mmol/L (ref 135–145)
Total Bilirubin: 0.7 mg/dL (ref 0.3–1.2)
Total Protein: 7.5 g/dL (ref 6.5–8.1)

## 2021-07-28 LAB — I-STAT BETA HCG BLOOD, ED (MC, WL, AP ONLY): I-stat hCG, quantitative: <5 m[IU]/mL (ref <5)

## 2021-07-28 MED ORDER — SODIUM CHLORIDE 0.9 % IV BOLUS
1000.0000 mL | Freq: Once | INTRAVENOUS | Status: AC
  Administered 2021-07-28: 1000 mL via INTRAVENOUS

## 2021-07-28 MED ORDER — ONDANSETRON 4 MG PO TBDP: 4.0000 mg | ORAL_TABLET | Freq: Three times a day (TID) | ORAL | 0 refills | Status: DC | PRN

## 2021-07-28 MED ORDER — MORPHINE SULFATE (PF) 4 MG/ML IV SOLN
4.0000 mg | Freq: Once | INTRAVENOUS | Status: AC
  Administered 2021-07-28: 4 mg via INTRAVENOUS
  Filled 2021-07-28: qty 1

## 2021-07-28 MED ORDER — ONDANSETRON HCL 4 MG/2ML IJ SOLN
4.0000 mg | Freq: Once | INTRAMUSCULAR | Status: AC
  Administered 2021-07-28: 4 mg via INTRAVENOUS
  Filled 2021-07-28: qty 2

## 2021-07-28 NOTE — ED Triage Notes (Signed)
Pt reports with abdominal pain, nausea, headache, and chills since last night.

## 2021-07-28 NOTE — Discharge Instructions (Signed)
Return for bloody vomit, blood stool, fever, or worsening pain.

## 2021-07-28 NOTE — ED Notes (Signed)
Pt given water for PO challenge 

## 2021-07-28 NOTE — ED Provider Notes (Signed)
[facility] Emergency Department Provider Note MRN:  [id number]  Arrival date & time: 07/28/21     Chief Complaint   Abdominal Pain, Headache, and Chills   History of Present Illness   Emily Saunders is a 46 y.o. year-old female presents to the ED with chief complaint of generalized abdominal pain and nausea.  Also reports chills and headache.  Denies any recent illness.  Last BM yesterday.  Denies any other associated symptoms.  History provided by patient.   Review of Systems  Pertinent review of systems noted in HPI.    Physical Exam   Vitals:   07/28/21 0203 07/28/21 0230  BP: (!) 154/92 123/64  Pulse: (!) 112 (!) 110  Resp: 18 18  Temp: 98.7 F (37.1 C)   SpO2: 98% 98%    CONSTITUTIONAL:  non toxic-appearing, NAD NEURO:  Alert and oriented x 3, CN 3-12 grossly intact EYES:  eyes equal and reactive ENT/NECK:  Supple, no stridor  CARDIO:  tachycardia, regular rhythm, appears well-perfused  PULM:  No respiratory distress, CTAB GI/GU:  non-distended, no focal tenderness  MSK/SPINE:  No gross deformities, no edema, moves all extremities  SKIN:  no rash, atraumatic   *Additional and/or pertinent findings included in MDM below  Diagnostic and Interventional Summary    EKG Interpretation  Date/Time:    Ventricular Rate:    PR Interval:    QRS Duration:   QT Interval:    QTC Calculation:   R Axis:     Text Interpretation:         Labs Reviewed  COMPREHENSIVE METABOLIC PANEL - Abnormal; Notable for the following components:      Result Value   CO2 20 (*)    Glucose, Bld 102 (*)    Alkaline Phosphatase 34 (*)    All other components within normal limits  CBC WITH DIFFERENTIAL/PLATELET - Abnormal; Notable for the following components:   Neutro Abs 8.8 (*)    All other components within normal limits  URINALYSIS, ROUTINE W REFLEX MICROSCOPIC - Abnormal; Notable for the following components:   Hgb urine dipstick MODERATE (*)     Bacteria, UA RARE (*)    All other components within normal limits  LIPASE, BLOOD  I-STAT BETA HCG BLOOD, ED ([facility], [facility], [facility] ONLY)    No orders to display    Medications  sodium chloride 0.9 % bolus 1,000 mL (1,000 mLs Intravenous New Bag/Given 07/28/21 0316)  morphine (PF) 4 MG/ML injection 4 mg (4 mg Intravenous Given 07/28/21 0317)  ondansetron (ZOFRAN) injection 4 mg (4 mg Intravenous Given 07/28/21 7793)     Procedures  /  Critical Care Procedures  ED Course and Medical Decision Making  I have reviewed the triage vital signs, the nursing notes, and pertinent available records from the EMR.  Social Determinants Affecting Complexity of Care: Patient has no clinically significant social determinants affecting this chief complaint..   ED Course:   Patient here with generalized abdominal pain and nausea. Medical Decision Making Patient here with generalized abdominal discomfort, nausea, and chills.  Abdomen is soft and non-tender.  Feeling improved after fluids, antiemetic, and morphine.  Labs are reassuring.  Based on no abdominal tenderness and reassuring labs, I don't think the patient needs advanced imaging.  Discussed return precautions.  Amount and/or Complexity of Data Reviewed Labs: ordered.    Details: no leukocytosis, preg -, no significant electrolyte derangement  Risk Prescription drug management.     Consultants: No consultations were  needed in caring for this patient.   Treatment and Plan: Emergency department workup does not suggest an emergent condition requiring admission or immediate intervention beyond  what has been performed at this time. The patient is safe for discharge and has  been instructed to return immediately for worsening symptoms, change in  symptoms or any other concerns    Final Clinical Impressions(s) / ED Diagnoses     ICD-10-CM   1. Nausea  R11.0     2. Chills  R68.83       ED Discharge Orders          Ordered    ondansetron  (ZOFRAN-ODT) 4 MG disintegrating tablet  Every 8 hours PRN        07/28/21 0341              Discharge Instructions Discussed with and Provided to Patient:     Discharge Instructions      Return for bloody vomit, blood stool, fever, or worsening pain.       Montine Circle, PA-C 07/28/21 0344    Molpus, Jenny Reichmann, MD 07/28/21 [phone]

## 2021-07-31 DIAGNOSIS — Z7251 High risk heterosexual behavior: Secondary | ICD-10-CM | POA: Diagnosis not present

## 2021-07-31 DIAGNOSIS — R5383 Other fatigue: Secondary | ICD-10-CM | POA: Diagnosis not present

## 2021-07-31 DIAGNOSIS — R945 Abnormal results of liver function studies: Secondary | ICD-10-CM | POA: Diagnosis not present

## 2021-07-31 DIAGNOSIS — I10 Essential (primary) hypertension: Secondary | ICD-10-CM | POA: Diagnosis not present

## 2021-07-31 DIAGNOSIS — N390 Urinary tract infection, site not specified: Secondary | ICD-10-CM | POA: Diagnosis not present

## 2021-07-31 DIAGNOSIS — Z0000 Encounter for general adult medical examination without abnormal findings: Secondary | ICD-10-CM | POA: Diagnosis not present

## 2021-07-31 DIAGNOSIS — Z0130 Encounter for examination of blood pressure without abnormal findings: Secondary | ICD-10-CM | POA: Diagnosis not present

## 2021-07-31 DIAGNOSIS — E559 Vitamin D deficiency, unspecified: Secondary | ICD-10-CM | POA: Diagnosis not present

## 2021-07-31 DIAGNOSIS — R7301 Impaired fasting glucose: Secondary | ICD-10-CM | POA: Diagnosis not present

## 2021-07-31 DIAGNOSIS — Z6837 Body mass index (BMI) 37.0-37.9, adult: Secondary | ICD-10-CM | POA: Diagnosis not present

## 2021-07-31 DIAGNOSIS — A499 Bacterial infection, unspecified: Secondary | ICD-10-CM | POA: Diagnosis not present

## 2021-07-31 DIAGNOSIS — N898 Other specified noninflammatory disorders of vagina: Secondary | ICD-10-CM | POA: Diagnosis not present

## 2021-07-31 DIAGNOSIS — E669 Obesity, unspecified: Secondary | ICD-10-CM | POA: Diagnosis not present

## 2021-07-31 DIAGNOSIS — R635 Abnormal weight gain: Secondary | ICD-10-CM | POA: Diagnosis not present

## 2021-08-05 DIAGNOSIS — E669 Obesity, unspecified: Secondary | ICD-10-CM | POA: Diagnosis not present

## 2021-08-05 DIAGNOSIS — Z0130 Encounter for examination of blood pressure without abnormal findings: Secondary | ICD-10-CM | POA: Diagnosis not present

## 2021-08-05 DIAGNOSIS — R945 Abnormal results of liver function studies: Secondary | ICD-10-CM | POA: Diagnosis not present

## 2021-08-05 DIAGNOSIS — B159 Hepatitis A without hepatic coma: Secondary | ICD-10-CM | POA: Diagnosis not present

## 2021-08-05 DIAGNOSIS — Z6836 Body mass index (BMI) 36.0-36.9, adult: Secondary | ICD-10-CM | POA: Diagnosis not present

## 2021-08-05 DIAGNOSIS — E559 Vitamin D deficiency, unspecified: Secondary | ICD-10-CM | POA: Diagnosis not present

## 2021-08-10 DIAGNOSIS — Z6837 Body mass index (BMI) 37.0-37.9, adult: Secondary | ICD-10-CM | POA: Diagnosis not present

## 2021-08-10 DIAGNOSIS — R635 Abnormal weight gain: Secondary | ICD-10-CM | POA: Diagnosis not present

## 2021-08-10 DIAGNOSIS — R7301 Impaired fasting glucose: Secondary | ICD-10-CM | POA: Diagnosis not present

## 2021-08-10 DIAGNOSIS — Z0130 Encounter for examination of blood pressure without abnormal findings: Secondary | ICD-10-CM | POA: Diagnosis not present

## 2021-08-10 DIAGNOSIS — I10 Essential (primary) hypertension: Secondary | ICD-10-CM | POA: Diagnosis not present

## 2021-08-10 DIAGNOSIS — E669 Obesity, unspecified: Secondary | ICD-10-CM | POA: Diagnosis not present

## 2021-09-03 DIAGNOSIS — Z6834 Body mass index (BMI) 34.0-34.9, adult: Secondary | ICD-10-CM | POA: Diagnosis not present

## 2021-09-03 DIAGNOSIS — R7303 Prediabetes: Secondary | ICD-10-CM | POA: Diagnosis not present

## 2021-09-03 DIAGNOSIS — R7301 Impaired fasting glucose: Secondary | ICD-10-CM | POA: Diagnosis not present

## 2021-09-03 DIAGNOSIS — R635 Abnormal weight gain: Secondary | ICD-10-CM | POA: Diagnosis not present

## 2021-09-03 DIAGNOSIS — R632 Polyphagia: Secondary | ICD-10-CM | POA: Diagnosis not present

## 2021-09-03 DIAGNOSIS — Z0130 Encounter for examination of blood pressure without abnormal findings: Secondary | ICD-10-CM | POA: Diagnosis not present

## 2021-09-03 DIAGNOSIS — E559 Vitamin D deficiency, unspecified: Secondary | ICD-10-CM | POA: Diagnosis not present

## 2021-09-03 DIAGNOSIS — E669 Obesity, unspecified: Secondary | ICD-10-CM | POA: Diagnosis not present

## 2021-09-03 DIAGNOSIS — R5381 Other malaise: Secondary | ICD-10-CM | POA: Diagnosis not present

## 2021-09-03 DIAGNOSIS — I10 Essential (primary) hypertension: Secondary | ICD-10-CM | POA: Diagnosis not present

## 2021-09-19 DIAGNOSIS — L299 Pruritus, unspecified: Secondary | ICD-10-CM | POA: Diagnosis not present

## 2021-09-19 DIAGNOSIS — R21 Rash and other nonspecific skin eruption: Secondary | ICD-10-CM | POA: Diagnosis not present

## 2021-09-26 DIAGNOSIS — U071 COVID-19: Secondary | ICD-10-CM | POA: Diagnosis not present

## 2021-09-26 DIAGNOSIS — R059 Cough, unspecified: Secondary | ICD-10-CM | POA: Diagnosis not present

## 2021-10-02 DIAGNOSIS — R5381 Other malaise: Secondary | ICD-10-CM | POA: Diagnosis not present

## 2021-10-02 DIAGNOSIS — Z0130 Encounter for examination of blood pressure without abnormal findings: Secondary | ICD-10-CM | POA: Diagnosis not present

## 2021-10-02 DIAGNOSIS — R635 Abnormal weight gain: Secondary | ICD-10-CM | POA: Diagnosis not present

## 2021-10-02 DIAGNOSIS — Z6834 Body mass index (BMI) 34.0-34.9, adult: Secondary | ICD-10-CM | POA: Diagnosis not present

## 2021-10-02 DIAGNOSIS — E669 Obesity, unspecified: Secondary | ICD-10-CM | POA: Diagnosis not present

## 2021-10-02 DIAGNOSIS — I10 Essential (primary) hypertension: Secondary | ICD-10-CM | POA: Diagnosis not present

## 2021-10-02 DIAGNOSIS — E559 Vitamin D deficiency, unspecified: Secondary | ICD-10-CM | POA: Diagnosis not present

## 2021-10-02 DIAGNOSIS — R7303 Prediabetes: Secondary | ICD-10-CM | POA: Diagnosis not present

## 2021-10-02 DIAGNOSIS — Z20822 Contact with and (suspected) exposure to covid-19: Secondary | ICD-10-CM | POA: Diagnosis not present

## 2021-10-02 DIAGNOSIS — R632 Polyphagia: Secondary | ICD-10-CM | POA: Diagnosis not present

## 2021-10-02 DIAGNOSIS — E7800 Pure hypercholesterolemia, unspecified: Secondary | ICD-10-CM | POA: Diagnosis not present

## 2021-10-02 DIAGNOSIS — R7301 Impaired fasting glucose: Secondary | ICD-10-CM | POA: Diagnosis not present

## 2021-10-07 IMAGING — US US ABDOMEN LIMITED
1 series · 15 of 25 positions shown · non-contrast
Comparison: None.

CLINICAL DATA: 45-year-old female with right upper quadrant
abdominal pain.

EXAM:
ULTRASOUND ABDOMEN LIMITED RIGHT UPPER QUADRANT

[Series 1: us abdomen limited ruq mc & wl · 15 of 51 slices shown]
[im 1/51]
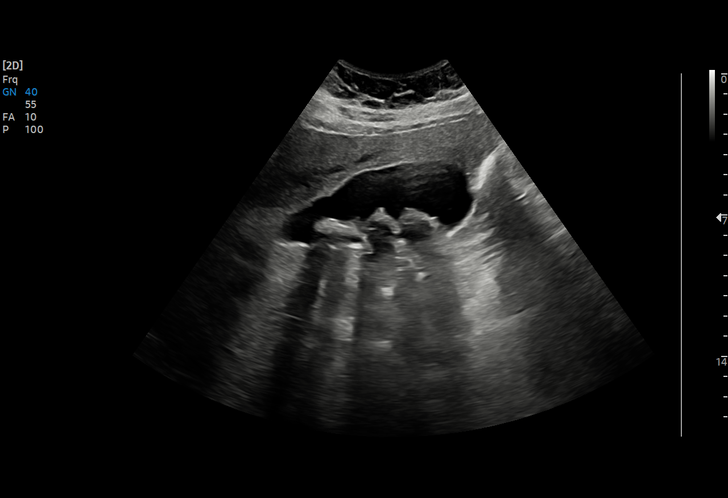
[im 5/51]
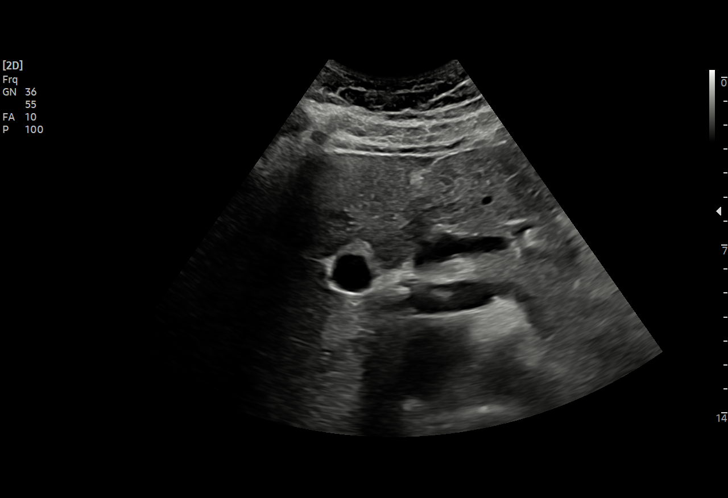
[im 9/51]
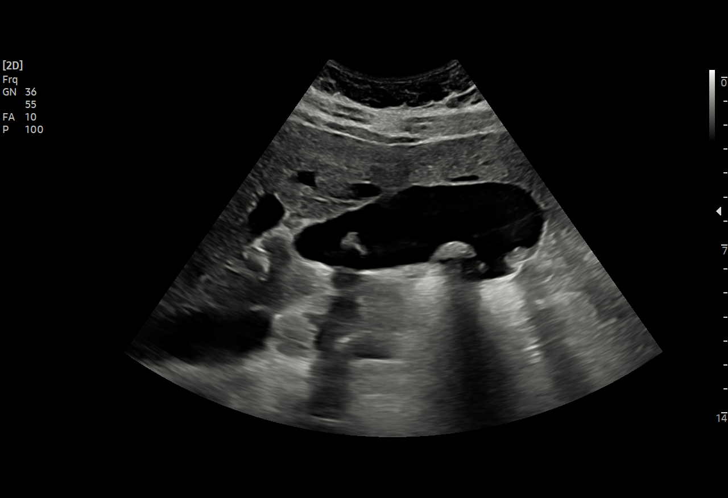
[im 11/51]
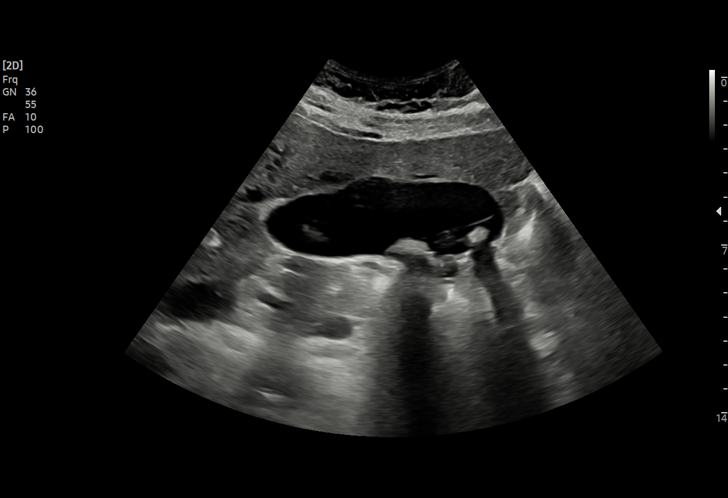
[im 15/51]
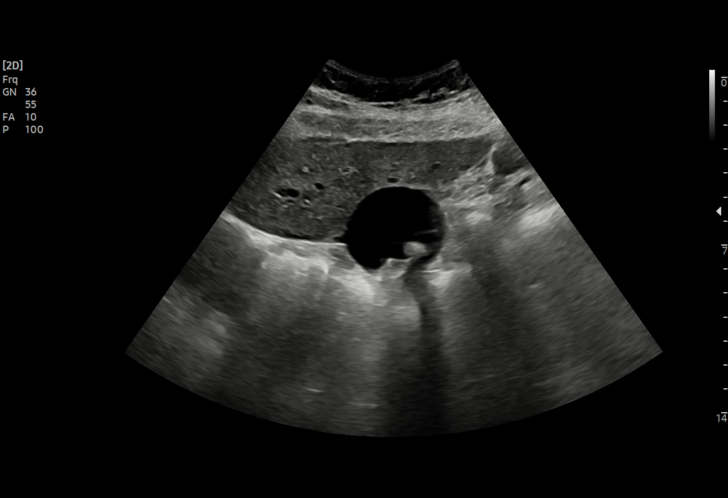
[im 19/51]
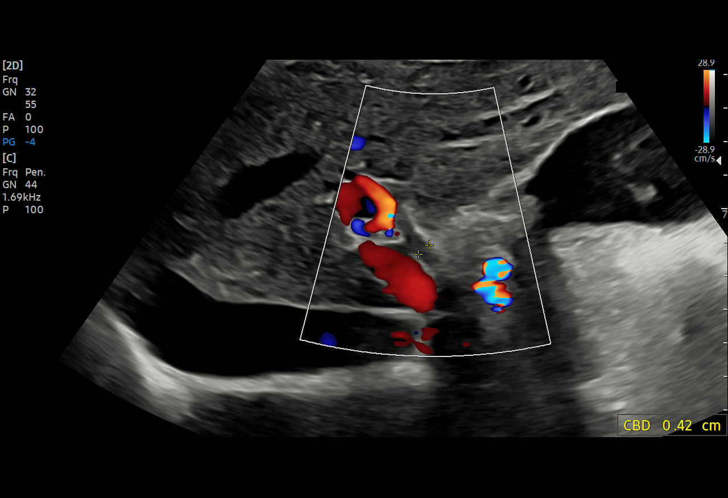
[im 21/51]
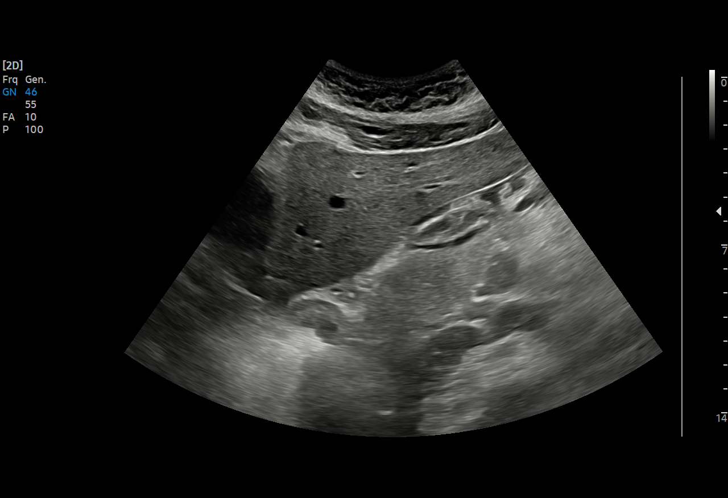
[im 26/51]
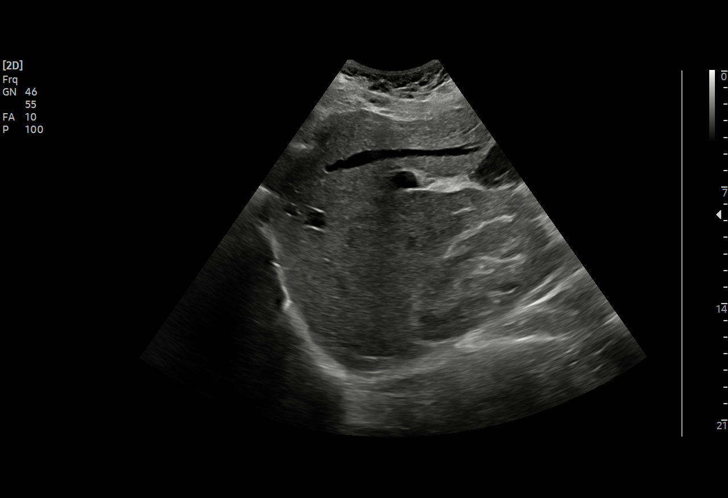
[im 30/51]
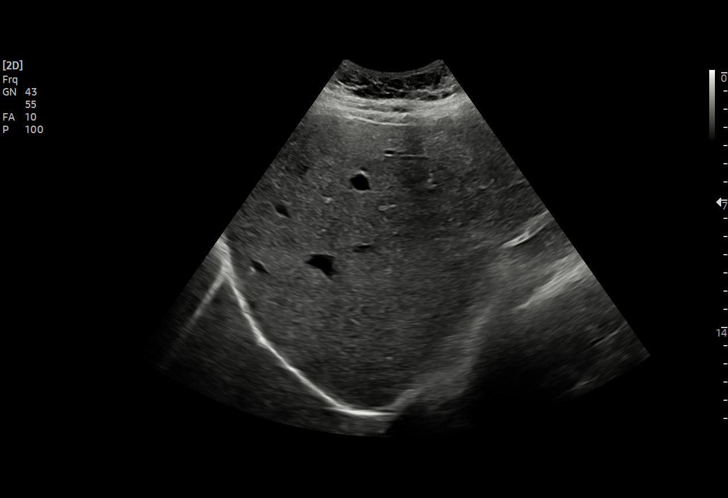
[im 32/51]
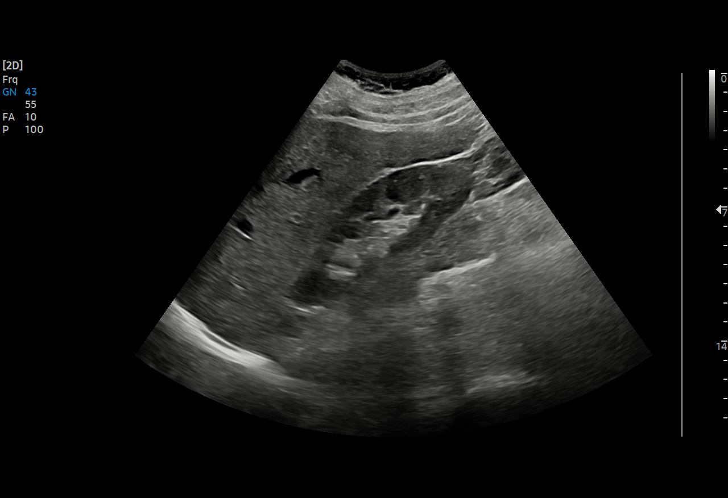
[im 36/51]
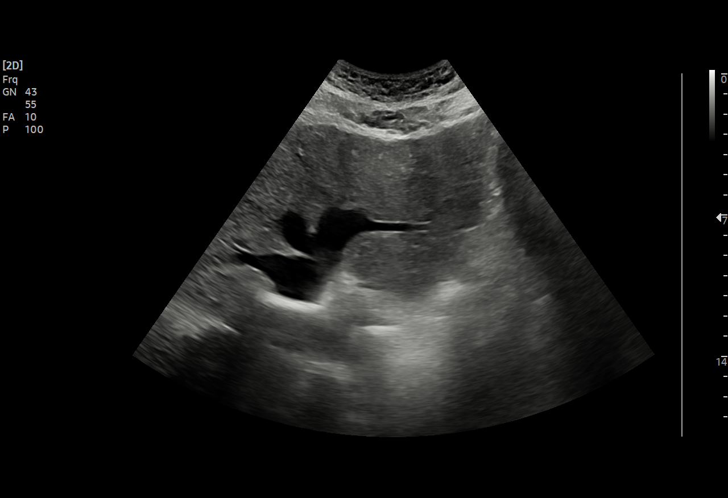
[im 40/51]
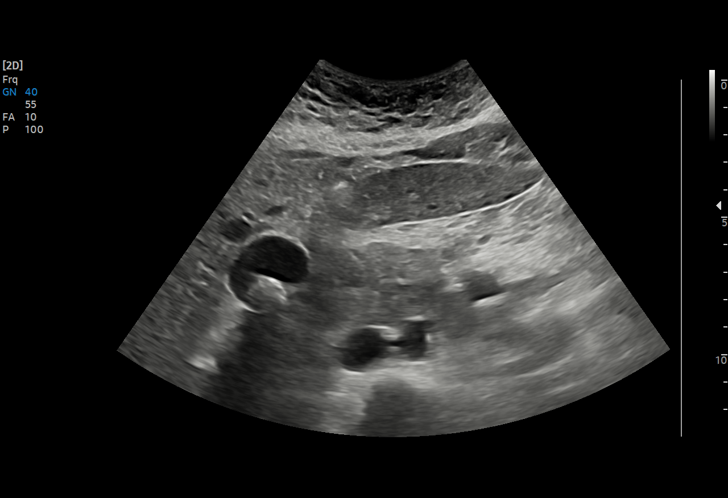
[im 42/51]
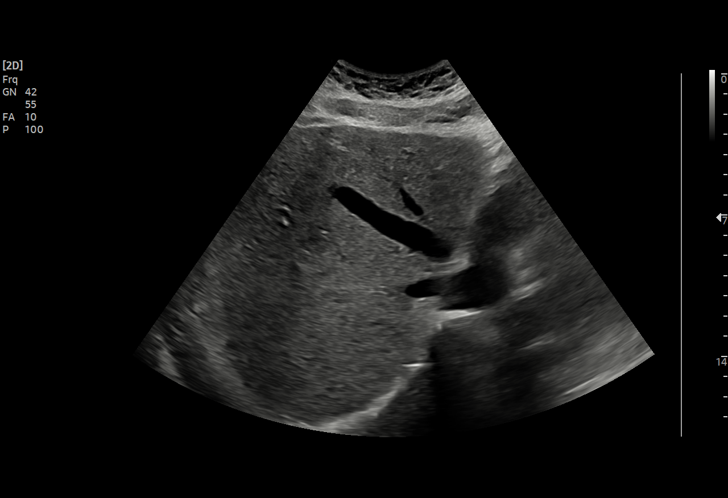
[im 46/51]
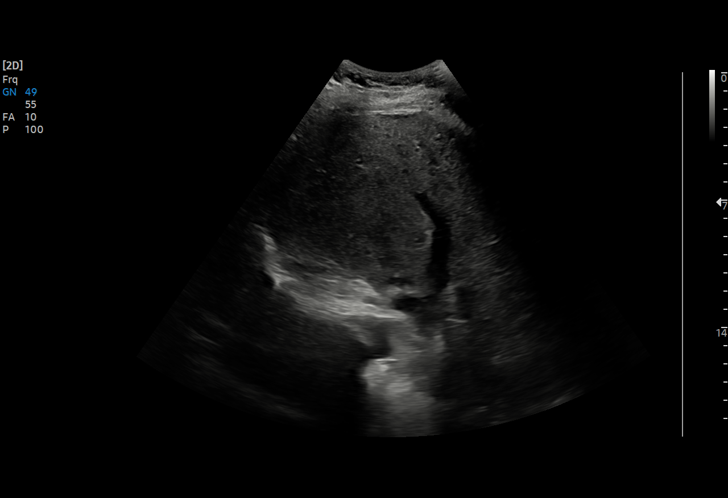
[im 51/51]
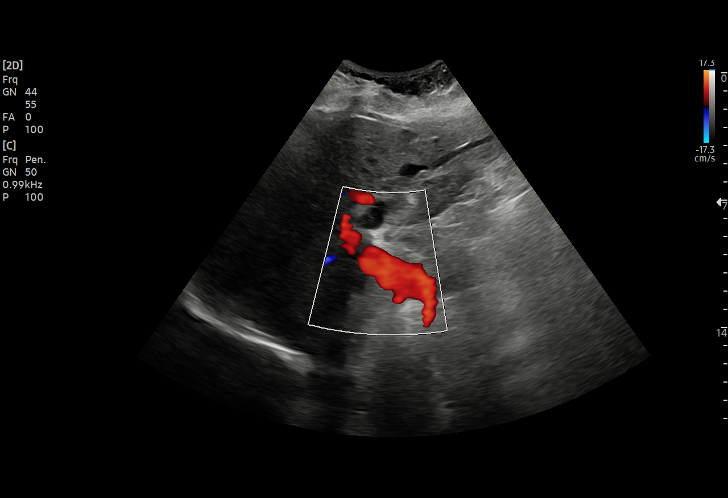

[15 of 25 positions shown; findings below may reference images not displayed]

FINDINGS: Gallbladder:

Multiple shadowing gallstones, individually estimated at 21 mm
diameter (image 5). Gallbladder wall thickness remains normal. No
pericholecystic fluid. However, a positive sonographic Murphy sign
is reported.

Common bile duct:

Diameter: 4 mm, normal.

Liver:

No focal lesion identified. Within normal limits in parenchymal
echogenicity (image 35). Portal vein is patent on color Doppler
imaging with normal direction of blood flow towards the liver.

Other: Negative visible right kidney.
IMPRESSION: Multiple gallstones, with positive sonographic Murphy sign raising
the possibility of acute cholecystitis although gallbladder wall
thickness appears to remain normal.

## 2021-10-20 DIAGNOSIS — R7989 Other specified abnormal findings of blood chemistry: Secondary | ICD-10-CM | POA: Diagnosis not present

## 2021-10-20 DIAGNOSIS — R0989 Other specified symptoms and signs involving the circulatory and respiratory systems: Secondary | ICD-10-CM | POA: Diagnosis not present

## 2021-10-20 DIAGNOSIS — R768 Other specified abnormal immunological findings in serum: Secondary | ICD-10-CM | POA: Diagnosis not present

## 2021-10-20 DIAGNOSIS — R160 Hepatomegaly, not elsewhere classified: Secondary | ICD-10-CM | POA: Diagnosis not present

## 2021-11-01 DIAGNOSIS — J3089 Other allergic rhinitis: Secondary | ICD-10-CM | POA: Diagnosis not present

## 2021-11-02 DIAGNOSIS — E669 Obesity, unspecified: Secondary | ICD-10-CM | POA: Diagnosis not present

## 2021-11-02 DIAGNOSIS — I10 Essential (primary) hypertension: Secondary | ICD-10-CM | POA: Diagnosis not present

## 2021-11-02 DIAGNOSIS — Z0130 Encounter for examination of blood pressure without abnormal findings: Secondary | ICD-10-CM | POA: Diagnosis not present

## 2021-11-02 DIAGNOSIS — Z79899 Other long term (current) drug therapy: Secondary | ICD-10-CM | POA: Diagnosis not present

## 2021-11-02 DIAGNOSIS — R7303 Prediabetes: Secondary | ICD-10-CM | POA: Diagnosis not present

## 2021-11-02 DIAGNOSIS — R632 Polyphagia: Secondary | ICD-10-CM | POA: Diagnosis not present

## 2021-11-02 DIAGNOSIS — R635 Abnormal weight gain: Secondary | ICD-10-CM | POA: Diagnosis not present

## 2021-11-02 DIAGNOSIS — E538 Deficiency of other specified B group vitamins: Secondary | ICD-10-CM | POA: Diagnosis not present

## 2021-11-02 DIAGNOSIS — R5381 Other malaise: Secondary | ICD-10-CM | POA: Diagnosis not present

## 2021-11-02 DIAGNOSIS — E559 Vitamin D deficiency, unspecified: Secondary | ICD-10-CM | POA: Diagnosis not present

## 2021-11-02 DIAGNOSIS — E7800 Pure hypercholesterolemia, unspecified: Secondary | ICD-10-CM | POA: Diagnosis not present

## 2021-11-02 DIAGNOSIS — Z6834 Body mass index (BMI) 34.0-34.9, adult: Secondary | ICD-10-CM | POA: Diagnosis not present

## 2021-12-02 DIAGNOSIS — R632 Polyphagia: Secondary | ICD-10-CM | POA: Diagnosis not present

## 2021-12-02 DIAGNOSIS — E669 Obesity, unspecified: Secondary | ICD-10-CM | POA: Diagnosis not present

## 2021-12-02 DIAGNOSIS — Z0130 Encounter for examination of blood pressure without abnormal findings: Secondary | ICD-10-CM | POA: Diagnosis not present

## 2021-12-02 DIAGNOSIS — Z3200 Encounter for pregnancy test, result unknown: Secondary | ICD-10-CM | POA: Diagnosis not present

## 2021-12-02 DIAGNOSIS — R635 Abnormal weight gain: Secondary | ICD-10-CM | POA: Diagnosis not present

## 2021-12-02 DIAGNOSIS — R7303 Prediabetes: Secondary | ICD-10-CM | POA: Diagnosis not present

## 2021-12-02 DIAGNOSIS — E559 Vitamin D deficiency, unspecified: Secondary | ICD-10-CM | POA: Diagnosis not present

## 2021-12-02 DIAGNOSIS — J302 Other seasonal allergic rhinitis: Secondary | ICD-10-CM | POA: Diagnosis not present

## 2021-12-02 DIAGNOSIS — E7800 Pure hypercholesterolemia, unspecified: Secondary | ICD-10-CM | POA: Diagnosis not present

## 2021-12-02 DIAGNOSIS — R5383 Other fatigue: Secondary | ICD-10-CM | POA: Diagnosis not present

## 2021-12-02 DIAGNOSIS — R300 Dysuria: Secondary | ICD-10-CM | POA: Diagnosis not present

## 2021-12-02 DIAGNOSIS — Z6835 Body mass index (BMI) 35.0-35.9, adult: Secondary | ICD-10-CM | POA: Diagnosis not present

## 2021-12-02 DIAGNOSIS — I10 Essential (primary) hypertension: Secondary | ICD-10-CM | POA: Diagnosis not present

## 2021-12-02 DIAGNOSIS — Z79899 Other long term (current) drug therapy: Secondary | ICD-10-CM | POA: Diagnosis not present

## 2021-12-09 DIAGNOSIS — K760 Fatty (change of) liver, not elsewhere classified: Secondary | ICD-10-CM | POA: Diagnosis not present

## 2021-12-20 ENCOUNTER — Other Ambulatory Visit: Payer: Self-pay | Admitting: Family Medicine

## 2021-12-20 DIAGNOSIS — Z1231 Encounter for screening mammogram for malignant neoplasm of breast: Secondary | ICD-10-CM

## 2022-01-03 DIAGNOSIS — Z6833 Body mass index (BMI) 33.0-33.9, adult: Secondary | ICD-10-CM | POA: Diagnosis not present

## 2022-01-03 DIAGNOSIS — Z0130 Encounter for examination of blood pressure without abnormal findings: Secondary | ICD-10-CM | POA: Diagnosis not present

## 2022-01-03 DIAGNOSIS — Z3200 Encounter for pregnancy test, result unknown: Secondary | ICD-10-CM | POA: Diagnosis not present

## 2022-01-03 DIAGNOSIS — R635 Abnormal weight gain: Secondary | ICD-10-CM | POA: Diagnosis not present

## 2022-01-03 DIAGNOSIS — R7301 Impaired fasting glucose: Secondary | ICD-10-CM | POA: Diagnosis not present

## 2022-01-03 DIAGNOSIS — I10 Essential (primary) hypertension: Secondary | ICD-10-CM | POA: Diagnosis not present

## 2022-01-03 DIAGNOSIS — E669 Obesity, unspecified: Secondary | ICD-10-CM | POA: Diagnosis not present

## 2022-01-03 DIAGNOSIS — R632 Polyphagia: Secondary | ICD-10-CM | POA: Diagnosis not present

## 2022-01-03 DIAGNOSIS — J302 Other seasonal allergic rhinitis: Secondary | ICD-10-CM | POA: Diagnosis not present

## 2022-01-28 ENCOUNTER — Ambulatory Visit
Admission: RE | Admit: 2022-01-28 | Discharge: 2022-01-28 | Disposition: A | Discharge status: Home / Self Care | Payer: Medicaid Other | Source: Ambulatory Visit | Attending: Family Medicine | Admitting: Family Medicine

## 2022-01-28 DIAGNOSIS — Z1231 Encounter for screening mammogram for malignant neoplasm of breast: Secondary | ICD-10-CM | POA: Diagnosis not present

## 2022-02-08 DIAGNOSIS — R635 Abnormal weight gain: Secondary | ICD-10-CM | POA: Diagnosis not present

## 2022-02-08 DIAGNOSIS — Z0130 Encounter for examination of blood pressure without abnormal findings: Secondary | ICD-10-CM | POA: Diagnosis not present

## 2022-02-08 DIAGNOSIS — I10 Essential (primary) hypertension: Secondary | ICD-10-CM | POA: Diagnosis not present

## 2022-02-08 DIAGNOSIS — Z6833 Body mass index (BMI) 33.0-33.9, adult: Secondary | ICD-10-CM | POA: Diagnosis not present

## 2022-02-08 DIAGNOSIS — R7301 Impaired fasting glucose: Secondary | ICD-10-CM | POA: Diagnosis not present

## 2022-02-08 DIAGNOSIS — E669 Obesity, unspecified: Secondary | ICD-10-CM | POA: Diagnosis not present

## 2022-02-08 DIAGNOSIS — R632 Polyphagia: Secondary | ICD-10-CM | POA: Diagnosis not present

## 2022-02-21 DIAGNOSIS — Z6831 Body mass index (BMI) 31.0-31.9, adult: Secondary | ICD-10-CM | POA: Diagnosis not present

## 2022-02-21 DIAGNOSIS — Z79899 Other long term (current) drug therapy: Secondary | ICD-10-CM | POA: Diagnosis not present

## 2022-02-21 DIAGNOSIS — S39012A Strain of muscle, fascia and tendon of lower back, initial encounter: Secondary | ICD-10-CM | POA: Diagnosis not present

## 2022-02-21 DIAGNOSIS — I10 Essential (primary) hypertension: Secondary | ICD-10-CM | POA: Diagnosis not present

## 2022-02-21 DIAGNOSIS — Z3200 Encounter for pregnancy test, result unknown: Secondary | ICD-10-CM | POA: Diagnosis not present

## 2022-02-21 DIAGNOSIS — Z0130 Encounter for examination of blood pressure without abnormal findings: Secondary | ICD-10-CM | POA: Diagnosis not present

## 2022-02-21 DIAGNOSIS — M549 Dorsalgia, unspecified: Secondary | ICD-10-CM | POA: Diagnosis not present

## 2022-02-21 DIAGNOSIS — E559 Vitamin D deficiency, unspecified: Secondary | ICD-10-CM | POA: Diagnosis not present

## 2022-03-09 ENCOUNTER — Ambulatory Visit: Payer: Medicaid Other | Admitting: Emergency Medicine

## 2022-03-09 ENCOUNTER — Other Ambulatory Visit (HOSPITAL_COMMUNITY)
Admission: RE | Admit: 2022-03-09 | Discharge: 2022-03-09 | Disposition: A | Discharge status: Home / Self Care | Payer: Medicaid Other | Source: Ambulatory Visit | Attending: Obstetrics and Gynecology | Admitting: Obstetrics and Gynecology

## 2022-03-09 VITALS — BP 116/79 | HR 74 | Ht 67.0 in | Wt 186.0 lb

## 2022-03-09 DIAGNOSIS — N898 Other specified noninflammatory disorders of vagina: Secondary | ICD-10-CM | POA: Diagnosis not present

## 2022-03-09 NOTE — Progress Notes (Signed)
SUBJECTIVE:  47 y.o. female complains of  vaginal itching for 4 day(s). Denies abnormal vaginal bleeding or significant pelvic pain or fever. No UTI symptoms. Denies history of known exposure to STD.  No LMP recorded.  OBJECTIVE:  She appears well, afebrile.   ASSESSMENT:  Vaginal Discharge  Vaginal Odor   PLAN:  GC, chlamydia, trichomonas, BVAG, CVAG probe sent to lab. Treatment: To be determined once lab results are received ROV prn if symptoms persist or worsen.

## 2022-03-10 LAB — CERVICOVAGINAL ANCILLARY ONLY
Bacterial Vaginitis (gardnerella): NEGATIVE
Candida Glabrata: NEGATIVE
Candida Vaginitis: NEGATIVE
Chlamydia: NEGATIVE
Comment: NEGATIVE
Comment: NEGATIVE
Comment: NEGATIVE
Comment: NEGATIVE
Comment: NEGATIVE
Comment: NORMAL
Neisseria Gonorrhea: NEGATIVE
Trichomonas: NEGATIVE

## 2022-03-11 DIAGNOSIS — I10 Essential (primary) hypertension: Secondary | ICD-10-CM | POA: Diagnosis not present

## 2022-03-11 DIAGNOSIS — R5381 Other malaise: Secondary | ICD-10-CM | POA: Diagnosis not present

## 2022-03-11 DIAGNOSIS — R7301 Impaired fasting glucose: Secondary | ICD-10-CM | POA: Diagnosis not present

## 2022-03-11 DIAGNOSIS — E669 Obesity, unspecified: Secondary | ICD-10-CM | POA: Diagnosis not present

## 2022-03-11 DIAGNOSIS — R632 Polyphagia: Secondary | ICD-10-CM | POA: Diagnosis not present

## 2022-03-11 DIAGNOSIS — R635 Abnormal weight gain: Secondary | ICD-10-CM | POA: Diagnosis not present

## 2022-03-11 DIAGNOSIS — Z6833 Body mass index (BMI) 33.0-33.9, adult: Secondary | ICD-10-CM | POA: Diagnosis not present

## 2022-03-11 DIAGNOSIS — R110 Nausea: Secondary | ICD-10-CM | POA: Diagnosis not present

## 2022-03-11 DIAGNOSIS — Z0130 Encounter for examination of blood pressure without abnormal findings: Secondary | ICD-10-CM | POA: Diagnosis not present

## 2022-03-31 ENCOUNTER — Telehealth (HOSPITAL_BASED_OUTPATIENT_CLINIC_OR_DEPARTMENT_OTHER): Payer: Self-pay | Admitting: Obstetrics & Gynecology

## 2022-03-31 NOTE — Telephone Encounter (Signed)
Patient called and said she needs be seen this week and would like for the nurse to please call her.

## 2022-04-01 NOTE — Telephone Encounter (Signed)
LMOVM returning pts call.  

## 2022-04-11 DIAGNOSIS — R5381 Other malaise: Secondary | ICD-10-CM | POA: Diagnosis not present

## 2022-04-11 DIAGNOSIS — I10 Essential (primary) hypertension: Secondary | ICD-10-CM | POA: Diagnosis not present

## 2022-04-11 DIAGNOSIS — R635 Abnormal weight gain: Secondary | ICD-10-CM | POA: Diagnosis not present

## 2022-04-11 DIAGNOSIS — R632 Polyphagia: Secondary | ICD-10-CM | POA: Diagnosis not present

## 2022-04-11 DIAGNOSIS — Z760 Encounter for issue of repeat prescription: Secondary | ICD-10-CM | POA: Diagnosis not present

## 2022-04-11 DIAGNOSIS — E538 Deficiency of other specified B group vitamins: Secondary | ICD-10-CM | POA: Diagnosis not present

## 2022-04-11 DIAGNOSIS — Z0130 Encounter for examination of blood pressure without abnormal findings: Secondary | ICD-10-CM | POA: Diagnosis not present

## 2022-04-11 DIAGNOSIS — R7301 Impaired fasting glucose: Secondary | ICD-10-CM | POA: Diagnosis not present

## 2022-04-11 DIAGNOSIS — R110 Nausea: Secondary | ICD-10-CM | POA: Diagnosis not present

## 2022-04-11 DIAGNOSIS — E7800 Pure hypercholesterolemia, unspecified: Secondary | ICD-10-CM | POA: Diagnosis not present

## 2022-04-11 DIAGNOSIS — Z131 Encounter for screening for diabetes mellitus: Secondary | ICD-10-CM | POA: Diagnosis not present

## 2022-04-11 DIAGNOSIS — Z6829 Body mass index (BMI) 29.0-29.9, adult: Secondary | ICD-10-CM | POA: Diagnosis not present

## 2022-04-18 NOTE — Progress Notes (Unsigned)
[facility] Return Visit  SUBJECTIVE  History of Present Illness: Emily Saunders is a 47 y.o. female seen in follow-up for possible prolapse. Plan at last visit was follow up as needed after her surgery.   TVH with bilateral salpingectomy, uterosacral suspension, A&P repair, and cystoscopy on 06/08/20 with Dr Wannetta Sender   Reports she is not seeing a bulge but is concerning it looks different. Denies bleeding or pain. She reports she has concerns that she can see her vagina with her legs slightly open with a mirror and wanted to know if this is normal.     Past Medical History: Patient  has a past medical history of GERD (gastroesophageal reflux disease), H/O pilonidal cyst (2002 ), History of hiatal hernia, Hypertension, PID (acute pelvic inflammatory disease) (2006 ), Pneumonia (2016), Vitamin D deficiency (2015 ), and Wears dentures.   Past Surgical History: She  has a past surgical history that includes Cesarean section; Cyst excision (Right, 2003); Nasal sinus surgery (Bilateral, 02/10/2020); Vaginal hysterectomy (N/A, 06/08/2020); Vaginal prolapse repair (N/A, 06/08/2020); Anterior and posterior repair (N/A, 06/08/2020); and Cystoscopy (N/A, 06/08/2020).   Medications: She has a current medication list which includes the following prescription(s): amlodipine, blood pressure kit, hydrochlorothiazide, montelukast, ondansetron, ozempic (1 mg/dose), polyethylene glycol powder, vitamin d (ergocalciferol), and omeprazole, and the following Facility-Administered Medications: estradiol.   Allergies: Patient is allergic to tape.   Social History: Patient  reports that she has quit smoking. Her smoking use included cigarettes. She has a 10.00 pack-year smoking history. She has never used smokeless tobacco. She reports that she does not drink alcohol and does not use drugs.      OBJECTIVE     Physical Exam: Vitals:   04/21/22 1113  BP: 114/78  Pulse: 78   Gen: No apparent distress,  A&O x 3.  Detailed Urogynecologic Evaluation:  POP-Q  -2                                            Aa   -2                                           Ba  -9                                              C   3                                            Gh  2.5                                            Pb  9.5                                            tvl   -  3                                            Ap  -3                                            Bp                                                 D    No noted bleeding, mass, irritation, or vaginal atrophy. Patient does have a purulent discharge at vaginal opening on exam.     ASSESSMENT AND PLAN    Emily Saunders is a 47 y.o. with:  1. Vaginal discharge    Patient reassured that her pelvic floor anatomy is well supported and the reports understanding. Her concerns reporting cosmetic appearance of the vagina were also addressed.  Aptima swab ordered as she has some d/c on exam. Patient is overall doing well.   Patient to follow up PRN.

## 2022-04-21 ENCOUNTER — Encounter: Payer: Self-pay | Admitting: Obstetrics and Gynecology

## 2022-04-21 ENCOUNTER — Ambulatory Visit: Payer: Medicaid Other | Admitting: Obstetrics and Gynecology

## 2022-04-21 ENCOUNTER — Other Ambulatory Visit (HOSPITAL_COMMUNITY)
Admission: RE | Admit: 2022-04-21 | Discharge: 2022-04-21 | Disposition: A | Discharge status: Home / Self Care | Payer: Medicaid Other | Source: Ambulatory Visit | Attending: Obstetrics and Gynecology | Admitting: Obstetrics and Gynecology

## 2022-04-21 VITALS — BP 114/78 | HR 78

## 2022-04-21 DIAGNOSIS — N898 Other specified noninflammatory disorders of vagina: Secondary | ICD-10-CM | POA: Insufficient documentation

## 2022-04-21 NOTE — Patient Instructions (Signed)
Use some lubrication for intercourse.   We will call you with your results.

## 2022-04-22 ENCOUNTER — Ambulatory Visit: Payer: Medicaid Other | Admitting: Obstetrics and Gynecology

## 2022-04-22 LAB — CERVICOVAGINAL ANCILLARY ONLY
Bacterial Vaginitis (gardnerella): NEGATIVE
Candida Glabrata: NEGATIVE
Candida Vaginitis: NEGATIVE
Comment: NEGATIVE
Comment: NEGATIVE
Comment: NEGATIVE
Comment: NEGATIVE
Trichomonas: NEGATIVE

## 2022-04-26 DIAGNOSIS — R09A2 Foreign body sensation, throat: Secondary | ICD-10-CM | POA: Diagnosis not present

## 2022-04-26 DIAGNOSIS — Z9109 Other allergy status, other than to drugs and biological substances: Secondary | ICD-10-CM | POA: Diagnosis not present

## 2022-04-26 DIAGNOSIS — Z131 Encounter for screening for diabetes mellitus: Secondary | ICD-10-CM | POA: Diagnosis not present

## 2022-04-26 DIAGNOSIS — Z6829 Body mass index (BMI) 29.0-29.9, adult: Secondary | ICD-10-CM | POA: Diagnosis not present

## 2022-05-11 DIAGNOSIS — I10 Essential (primary) hypertension: Secondary | ICD-10-CM | POA: Diagnosis not present

## 2022-05-11 DIAGNOSIS — Z6828 Body mass index (BMI) 28.0-28.9, adult: Secondary | ICD-10-CM | POA: Diagnosis not present

## 2022-05-11 DIAGNOSIS — Z760 Encounter for issue of repeat prescription: Secondary | ICD-10-CM | POA: Diagnosis not present

## 2022-05-11 DIAGNOSIS — Z0130 Encounter for examination of blood pressure without abnormal findings: Secondary | ICD-10-CM | POA: Diagnosis not present

## 2022-05-11 DIAGNOSIS — R635 Abnormal weight gain: Secondary | ICD-10-CM | POA: Diagnosis not present

## 2022-05-11 DIAGNOSIS — E7800 Pure hypercholesterolemia, unspecified: Secondary | ICD-10-CM | POA: Diagnosis not present

## 2022-05-11 DIAGNOSIS — R5381 Other malaise: Secondary | ICD-10-CM | POA: Diagnosis not present

## 2022-05-11 DIAGNOSIS — R632 Polyphagia: Secondary | ICD-10-CM | POA: Diagnosis not present

## 2022-05-25 ENCOUNTER — Ambulatory Visit: Payer: Medicaid Other | Admitting: Obstetrics

## 2022-05-25 ENCOUNTER — Encounter: Payer: Self-pay | Admitting: Obstetrics

## 2022-05-25 ENCOUNTER — Other Ambulatory Visit (HOSPITAL_COMMUNITY)
Admission: RE | Admit: 2022-05-25 | Discharge: 2022-05-25 | Disposition: A | Discharge status: Home / Self Care | Payer: Medicaid Other | Source: Ambulatory Visit | Attending: Obstetrics | Admitting: Obstetrics

## 2022-05-25 VITALS — BP 100/75 | HR 66 | Ht 66.0 in | Wt 179.0 lb

## 2022-05-25 DIAGNOSIS — Z01419 Encounter for gynecological examination (general) (routine) without abnormal findings: Secondary | ICD-10-CM

## 2022-05-25 DIAGNOSIS — R519 Headache, unspecified: Secondary | ICD-10-CM

## 2022-05-25 DIAGNOSIS — N898 Other specified noninflammatory disorders of vagina: Secondary | ICD-10-CM | POA: Insufficient documentation

## 2022-05-25 DIAGNOSIS — R300 Dysuria: Secondary | ICD-10-CM | POA: Diagnosis not present

## 2022-05-25 LAB — POCT URINALYSIS DIPSTICK
Bilirubin, UA: POSITIVE
Blood, UA: NEGATIVE
Glucose, UA: NEGATIVE
Ketones, UA: NEGATIVE
Protein, UA: NEGATIVE
Spec Grav, UA: 1.01 (ref 1.010–1.025)
Urobilinogen, UA: 0.2 E.U./dL
pH, UA: 7 (ref 5.0–8.0)

## 2022-05-25 MED ORDER — NITROFURANTOIN MONOHYD MACRO 100 MG PO CAPS
100.0000 mg | ORAL_CAPSULE | Freq: Two times a day (BID) | ORAL | 2 refills | Status: DC
Start: 2022-05-25 — End: 2022-07-08

## 2022-05-25 MED ORDER — FLUCONAZOLE 150 MG PO TABS
150.0000 mg | ORAL_TABLET | Freq: Once | ORAL | 0 refills | Status: AC
Start: 2022-05-25 — End: 2022-05-25

## 2022-05-25 NOTE — Progress Notes (Signed)
Subjective:        Emily Saunders is a 47 y.o. female here for a routine exam.  Current complaints: *Strong, dark urine and vaginal itching.    Personal health questionnaire:  Is patient Ashkenazi Jewish, have a family history of breast and/or ovarian cancer: yes Is there a family history of uterine cancer diagnosed at age < 25, gastrointestinal cancer, urinary tract cancer, family member who is a Personnel officer syndrome-associated carrier: no Is the patient overweight and hypertensive, family history of diabetes, personal history of gestational diabetes, preeclampsia or PCOS: no Is patient over 45, have PCOS,  family history of premature CHD under age 56, diabetes, smoke, have hypertension or peripheral artery disease:  no At any time, has a partner hit, kicked or otherwise hurt or frightened you?: no Over the past 2 weeks, have you felt down, depressed or hopeless?: no Over the past 2 weeks, have you felt little interest or pleasure in doing things?:no   Gynecologic Histp:ory Patient's last menstrual period was 08/31/2020. Contraception: status post hysterectomy Last Pa 2020. Results were: normal Last mammogram: 2023. Results were: normal  Obstetric History OB History  Gravida Para Term Preterm AB Living  6 4 4   2 4   SAB IAB Ectopic Multiple Live Births  1   1 0 4    # Outcome Date GA Lbr Len/2nd Weight Sex Delivery Anes PTL Lv  6 Term 12/16/15 [redacted]w[redacted]d 02:30 / 00:16 6 lb 15.6 oz (3.165 kg) F VBAC EPI  LIV     Birth Comments: none  5 SAB 02/14/13        DEC     Birth Comments: System Generated. Please review and update pregnancy details.  4 Ectopic 2005        DEC  3 Term 04/04/01 [redacted]w[redacted]d  7 lb (3.175 kg) M Vag-Spont EPI  LIV  2 Term 01/15/97 [redacted]w[redacted]d  7 lb (3.175 kg) F Vag-Spont EPI  LIV  1 Term 04/11/93 [redacted]w[redacted]d  7 lb (3.175 kg) F CS-LTranv EPI  LIV    Past Medical History:  Diagnosis Date   GERD (gastroesophageal reflux disease)    H/O pilonidal cyst 2002    History of hiatal  hernia    Hypertension    PID (acute pelvic inflammatory disease) 2006    Pneumonia 2016   Vitamin D deficiency 2015    Wears dentures    FULL SET    Past Surgical History:  Procedure Laterality Date   ANTERIOR AND POSTERIOR REPAIR N/A 06/08/2020   Procedure: ANTERIOR (CYSTOCELE) AND POSTERIOR REPAIR (RECTOCELE) with perineorrhaphy;  Surgeon: Marguerita Beards, MD;  Location: [facility];  Service: Gynecology;  Laterality: N/A;   CESAREAN SECTION     X 1   CYST EXCISION Right 2003   hand   CYSTOSCOPY N/A 06/08/2020   Procedure: CYSTOSCOPY;  Surgeon: Marguerita Beards, MD;  Location: [facility];  Service: Gynecology;  Laterality: N/A;   NASAL SINUS SURGERY Bilateral 02/10/2020   Procedure: ENDOSCOPIC SINUS SURGERY Maxillary Anstrostomy with Tissue Removal;  Surgeon: [name], MD;  Location: [facility];  Service: ENT;  Laterality: Bilateral;   VAGINAL HYSTERECTOMY N/A 06/08/2020   Procedure: HYSTERECTOMY VAGINAL with bilateral salpingectomy;  Surgeon: Marguerita Beards, MD;  Location: [facility];  Service: Gynecology;  Laterality: N/A;   VAGINAL PROLAPSE REPAIR N/A 06/08/2020   Procedure: UTEROSACRAL SUSPENSION;  Surgeon: Marguerita Beards, MD;  Location: [facility];  Service:  Gynecology;  Laterality: N/A;     Current Outpatient Medications:    fluconazole (DIFLUCAN) 150 MG tablet, Take 1 tablet (150 mg total) by mouth once for 1 dose., Disp: 1 tablet, Rfl: 0   nitrofurantoin, macrocrystal-monohydrate, (MACROBID) 100 MG capsule, Take 1 capsule (100 mg total) by mouth 2 (two) times daily. 1 po BID x 7days, Disp: 14 capsule, Rfl: 2   amLODipine (NORVASC) 10 MG tablet, Take 1 tablet (10 mg total) by mouth daily., Disp: 30 tablet, Rfl: 11   Blood Pressure Monitoring (BLOOD PRESSURE KIT) DEVI, 1 mL by Does not apply route 3 (three) times daily., Disp: 1 Bag, Rfl: 0   hydrochlorothiazide (HYDRODIURIL) 25 MG  tablet, Take 1 tablet (25 mg total) by mouth daily., Disp: 30 tablet, Rfl: 11   montelukast (SINGULAIR) 10 MG tablet, Take 10 mg by mouth daily., Disp: , Rfl:    omeprazole (PRILOSEC) 40 MG capsule, Take 40 mg by mouth daily. (Patient not taking: Reported on 04/21/2022), Disp: , Rfl:    ondansetron (ZOFRAN-ODT) 4 MG disintegrating tablet, Take 1 tablet (4 mg total) by mouth every 8 (eight) hours as needed for nausea or vomiting., Disp: 10 tablet, Rfl: 0   OZEMPIC, 1 MG/DOSE, 4 MG/3ML SOPN, Inject 1 mg into the skin once a week., Disp: , Rfl:    polyethylene glycol powder (GLYCOLAX/MIRALAX) 17 GM/SCOOP powder, Take 17 g by mouth daily. Drink 17g (1 scoop) dissolved in water per day., Disp: 17 g, Rfl: 0   Vitamin D, Ergocalciferol, (DRISDOL) 1.25 MG (50000 UNIT) CAPS capsule, Take 50,000 Units by mouth every 7 (seven) days. TUESDAYS, Disp: , Rfl:   Current Facility-Administered Medications:    estradiol (ESTRACE) vaginal cream 1 Applicatorful, 1 Applicatorful, Vaginal, BID, Jerene Bears, MD Allergies  Allergen Reactions   Tape     ADHESIVE SURGICAL TAPE BURNT SKIN    Social History   Tobacco Use   Smoking status: Former    Packs/day: 0.50    Years: 20.00    Additional pack years: 0.00    Total pack years: 10.00    Types: Cigarettes   Smokeless tobacco: Current   Tobacco comments:    Does vape with nicotine  Substance Use Topics   Alcohol use: No    Alcohol/week: 0.0 standard drinks of alcohol    Family History  Problem Relation Age of Onset   Hypertension Mother    Diabetes Mother    Asthma Mother    COPD Mother    Depression Mother    Miscarriages / [place] Mother    Vision loss Mother    Heart disease Mother    Parkinson's disease Father    Diabetes Maternal Aunt    Diabetes Maternal Uncle    Diabetes Cousin    Breast cancer Maternal Grandmother    Leukemia Sister       Review of Systems  Constitutional: negative for fatigue and weight loss Respiratory:  negative for cough and wheezing Cardiovascular: negative for chest pain, fatigue and palpitations Gastrointestinal: negative for abdominal pain and change in bowel habits Musculoskeletal:negative for myalgias Neurological: negative for gait problems and tremors Behavioral/Psych: negative for abusive relationship, depression Endocrine: negative for temperature intolerance    Genitourinary:negative for abnormal menstrual periods, genital lesions, hot flashes, sexual problems and vaginal discharge Integument/breast: negative for breast lump, breast tenderness, nipple discharge and skin lesion(s)    Objective:       BP 100/75   Pulse 66   Ht  (1.676 m)  Wt 179 lb (81.2 kg)   LMP 08/31/2020   BMI 28.89 kg/m  General:   Alert and no distress  Skin:   no rash or abnormalities  Lungs:   clear to auscultation bilaterally  Heart:   regular rate and rhythm, S1, S2 normal, no murmur, click, rub or gallop  Breasts:   normal without suspicious masses, skin or nipple changes or axillary nodes  Abdomen:  normal findings: no organomegaly, soft, non-tender and no hernia  Pelvis:  External genitalia: normal general appearance Urinary system: urethral meatus normal and bladder without fullness, nontender Vaginal: normal without tenderness, induration or masses Cervix: normal appearance Adnexa: normal bimanual exam Uterus: anteverted and non-tender, normal size   Lab Review Urine pregnancy test Labs reviewed yes Radiologic studies reviewed yes  I have spent a total of 20 minutes of face-to-facet time, excluding clinical staff time, reviewing notes and preparing to see patient, ordering tests and/or medications, and counseling the patient.   Assessment:    1. Women's annual routine gynecological examination [Z01.419]  2. Dysuria Rx: - POCT Urinalysis Dipstick - Urine Culture - nitrofurantoin, macrocrystal-monohydrate, (MACROBID) 100 MG capsule; Take 1 capsule (100 mg total) by mouth  2 (two) times daily. 1 po BID x 7days  Dispense: 14 capsule; Refill: 2  3. Vaginal discharge Rx: - Cervicovaginal ancillary only( [place]) - fluconazole (DIFLUCAN) 150 MG tablet; Take 1 tablet (150 mg total) by mouth once for 1 dose.  Dispense: 1 tablet; Refill: 0  4. Frequent headaches Rx: - Ambulatory referral to Neurology     Plan:    Education reviewed: calcium supplements, depression evaluation, low fat, low cholesterol diet, safe sex/STD prevention, self breast exams, and weight bearing exercise. Follow up in: 1 year.   Meds ordered this encounter  Medications   fluconazole (DIFLUCAN) 150 MG tablet    Sig: Take 1 tablet (150 mg total) by mouth once for 1 dose.    Dispense:  1 tablet    Refill:  0   nitrofurantoin, macrocrystal-monohydrate, (MACROBID) 100 MG capsule    Sig: Take 1 capsule (100 mg total) by mouth 2 (two) times daily. 1 po BID x 7days    Dispense:  14 capsule    Refill:  2   Orders Placed This Encounter  Procedures   Urine Culture   Ambulatory referral to Neurology    Referral Priority:   Routine    Referral Type:   Consultation    Referral Reason:   Specialty Services Required    Requested Specialty:   Neurology    Number of Visits Requested:   1   POCT Urinalysis Dipstick   Brock Bad, MD 05/25/2022 5:11 PM

## 2022-05-25 NOTE — Progress Notes (Addendum)
47 y.o. GYN presents for AEX.  C/o strong odor in urine, vaginal itching x 5 days

## 2022-05-26 DIAGNOSIS — R09A2 Foreign body sensation, throat: Secondary | ICD-10-CM | POA: Diagnosis not present

## 2022-05-26 DIAGNOSIS — J329 Chronic sinusitis, unspecified: Secondary | ICD-10-CM | POA: Diagnosis not present

## 2022-05-26 DIAGNOSIS — I10 Essential (primary) hypertension: Secondary | ICD-10-CM | POA: Diagnosis not present

## 2022-05-26 DIAGNOSIS — B9689 Other specified bacterial agents as the cause of diseases classified elsewhere: Secondary | ICD-10-CM | POA: Diagnosis not present

## 2022-05-26 DIAGNOSIS — Z6829 Body mass index (BMI) 29.0-29.9, adult: Secondary | ICD-10-CM | POA: Diagnosis not present

## 2022-05-26 LAB — CERVICOVAGINAL ANCILLARY ONLY
Bacterial Vaginitis (gardnerella): POSITIVE — AB
Candida Glabrata: NEGATIVE
Candida Vaginitis: NEGATIVE
Chlamydia: NEGATIVE
Comment: NEGATIVE
Comment: NEGATIVE
Comment: NEGATIVE
Comment: NEGATIVE
Comment: NEGATIVE
Comment: NORMAL
Neisseria Gonorrhea: NEGATIVE
Trichomonas: NEGATIVE

## 2022-05-27 ENCOUNTER — Other Ambulatory Visit: Payer: Self-pay | Admitting: Obstetrics

## 2022-05-27 DIAGNOSIS — B9689 Other specified bacterial agents as the cause of diseases classified elsewhere: Secondary | ICD-10-CM

## 2022-05-27 LAB — URINE CULTURE

## 2022-05-27 MED ORDER — METRONIDAZOLE 500 MG PO TABS
500.0000 mg | ORAL_TABLET | Freq: Two times a day (BID) | ORAL | 2 refills | Status: DC
Start: 2022-05-27 — End: 2022-06-15

## 2022-06-13 DIAGNOSIS — H6121 Impacted cerumen, right ear: Secondary | ICD-10-CM | POA: Diagnosis not present

## 2022-06-15 ENCOUNTER — Other Ambulatory Visit: Payer: Self-pay

## 2022-06-15 DIAGNOSIS — B379 Candidiasis, unspecified: Secondary | ICD-10-CM

## 2022-06-15 DIAGNOSIS — B9689 Other specified bacterial agents as the cause of diseases classified elsewhere: Secondary | ICD-10-CM

## 2022-06-15 MED ORDER — FLUCONAZOLE 150 MG PO TABS
150.0000 mg | ORAL_TABLET | Freq: Once | ORAL | 0 refills | Status: AC
Start: 2022-06-15 — End: 2022-06-15

## 2022-06-15 MED ORDER — METRONIDAZOLE 500 MG PO TABS
500.0000 mg | ORAL_TABLET | Freq: Two times a day (BID) | ORAL | 2 refills | Status: DC
Start: 2022-06-15 — End: 2022-07-08

## 2022-06-22 ENCOUNTER — Other Ambulatory Visit (HOSPITAL_COMMUNITY)
Admission: RE | Admit: 2022-06-22 | Discharge: 2022-06-22 | Disposition: A | Discharge status: Home / Self Care | Payer: Medicaid Other | Source: Other Acute Inpatient Hospital | Attending: Obstetrics and Gynecology | Admitting: Obstetrics and Gynecology

## 2022-06-22 ENCOUNTER — Ambulatory Visit (INDEPENDENT_AMBULATORY_CARE_PROVIDER_SITE_OTHER): Payer: Medicaid Other

## 2022-06-22 DIAGNOSIS — R350 Frequency of micturition: Secondary | ICD-10-CM

## 2022-06-22 DIAGNOSIS — R82998 Other abnormal findings in urine: Secondary | ICD-10-CM | POA: Diagnosis not present

## 2022-06-22 LAB — POCT URINALYSIS DIPSTICK
Bilirubin, UA: NEGATIVE
Blood, UA: NEGATIVE
Glucose, UA: NEGATIVE
Ketones, UA: NEGATIVE
Nitrite, UA: NEGATIVE
Protein, UA: NEGATIVE
Spec Grav, UA: >=1.03 — AB (ref 1.010–1.025)
Urobilinogen, UA: 0.2 E.U./dL
pH, UA: 6 (ref 5.0–8.0)

## 2022-06-22 MED ORDER — NITROFURANTOIN MONOHYD MACRO 100 MG PO CAPS: 100.0000 mg | ORAL_CAPSULE | Freq: Two times a day (BID) | ORAL | 0 refills | Status: DC

## 2022-06-22 NOTE — Progress Notes (Signed)
Emily Saunders is a 47 y.o. female arrived today with UTI sx.  Per Dr. Jari Favre protocol: A urine specimen was collected and POCT Urine was done and urine culture sent to the lab. POCT Urine was positive for trace leukocytes  Pt was notified and prescription sent to the preferred pharmacy.

## 2022-06-22 NOTE — Patient Instructions (Signed)
Your Urine dip that was done in office was Positive for Leukocytes. I am sending the urine off for culture and you can take AZO over the counter for your discomfort.  We have also ordered Macrobid for you to take while we wait for your culture results, hopefully this gives you some relief. We will contact you when the results are back between 3-5 days. If a different antibiotic is needed we will sent the order to the pharmacy and you will be notified. If you have any questions or concerns please feel free to call us at [phone]

## 2022-06-23 LAB — URINE CULTURE: Culture: NO GROWTH

## 2022-06-24 DIAGNOSIS — R7301 Impaired fasting glucose: Secondary | ICD-10-CM | POA: Diagnosis not present

## 2022-06-24 DIAGNOSIS — R632 Polyphagia: Secondary | ICD-10-CM | POA: Diagnosis not present

## 2022-06-24 DIAGNOSIS — I10 Essential (primary) hypertension: Secondary | ICD-10-CM | POA: Diagnosis not present

## 2022-06-24 DIAGNOSIS — R635 Abnormal weight gain: Secondary | ICD-10-CM | POA: Diagnosis not present

## 2022-06-24 DIAGNOSIS — Z0130 Encounter for examination of blood pressure without abnormal findings: Secondary | ICD-10-CM | POA: Diagnosis not present

## 2022-06-24 DIAGNOSIS — Z6829 Body mass index (BMI) 29.0-29.9, adult: Secondary | ICD-10-CM | POA: Diagnosis not present

## 2022-06-27 ENCOUNTER — Telehealth: Payer: Self-pay

## 2022-06-27 ENCOUNTER — Other Ambulatory Visit: Payer: Self-pay | Admitting: Obstetrics and Gynecology

## 2022-06-27 DIAGNOSIS — N952 Postmenopausal atrophic vaginitis: Secondary | ICD-10-CM

## 2022-06-27 MED ORDER — ESTROGENS CONJUGATED 0.625 MG/GM VA CREA
1.0000 | TOPICAL_CREAM | VAGINAL | 5 refills | Status: AC
Start: 2022-06-27 — End: ?

## 2022-06-27 NOTE — Telephone Encounter (Signed)
Patient contacted the office stating she having burning and foul smelling urine after intercourse. A prescription was sent into the pharmacy for Estrace. Patient was advised to come in the office to urine sample if sx continue.

## 2022-06-27 NOTE — Progress Notes (Signed)
Patient needed a refill of vaginal estrogen cream related to burning with/around intercourse. Premarin vaginal cream sent to pharmacy for patient.

## 2022-07-08 ENCOUNTER — Encounter (HOSPITAL_COMMUNITY): Payer: Self-pay

## 2022-07-08 ENCOUNTER — Emergency Department (HOSPITAL_COMMUNITY)
Admission: EM | Admit: 2022-07-08 | Discharge: 2022-07-08 | Disposition: A | Discharge status: Home / Self Care | Payer: Medicaid Other | Attending: Emergency Medicine | Admitting: Emergency Medicine

## 2022-07-08 ENCOUNTER — Other Ambulatory Visit: Payer: Self-pay

## 2022-07-08 DIAGNOSIS — R21 Rash and other nonspecific skin eruption: Secondary | ICD-10-CM | POA: Insufficient documentation

## 2022-07-08 DIAGNOSIS — Z79899 Other long term (current) drug therapy: Secondary | ICD-10-CM | POA: Insufficient documentation

## 2022-07-08 DIAGNOSIS — L0591 Pilonidal cyst without abscess: Secondary | ICD-10-CM | POA: Diagnosis not present

## 2022-07-08 DIAGNOSIS — I10 Essential (primary) hypertension: Secondary | ICD-10-CM | POA: Insufficient documentation

## 2022-07-08 MED ORDER — PREDNISONE 50 MG PO TABS: ORAL_TABLET | ORAL | 0 refills | Status: DC

## 2022-07-08 MED ORDER — PREDNISONE 20 MG PO TABS
60.0000 mg | ORAL_TABLET | Freq: Once | ORAL | Status: AC
  Administered 2022-07-08: 60 mg via ORAL
  Filled 2022-07-08: qty 3

## 2022-07-08 NOTE — ED Triage Notes (Signed)
Pt reports with rash on her chest, belly button, and hives on her back for several days. Pt states that she has a cyst on her buttocks.

## 2022-07-09 NOTE — ED Provider Notes (Signed)
[place] EMERGENCY DEPARTMENT AT [facility] Provider Note   CSN: [id number] Arrival date & time: 07/08/22  2200     History  Chief Complaint  Patient presents with   Rash    Emily Saunders is a 47 y.o. female.  The history is provided by the patient.   Patient with history of hypertension and previous pilonidal cyst presents with multiple complaints  Patient reports for the past several days she has had a rash throughout her body.  She now reports she is developing hives and it is itching.  It has not responded to topical medications.  No facial swelling, no shortness of breath.  No difficulty swallowing is reported.  No new medications, no recent tick or insect bites.  No recent travel or camping.  No one else at home has these symptoms    She is also concerned that she may be developing a new pilonidal cyst. Past Medical History:  Diagnosis Date   GERD (gastroesophageal reflux disease)    H/O pilonidal cyst 2002    History of hiatal hernia    Hypertension    PID (acute pelvic inflammatory disease) 2006    Pneumonia 2016   Vitamin D deficiency 2015    Wears dentures    FULL SET    Home Medications Prior to Admission medications   Medication Sig Start Date End Date Taking? Authorizing Provider  conjugated estrogens (PREMARIN) vaginal cream Place 1 Applicatorful vaginally 2 (two) times a week. Place 0.5g nightly for two weeks then twice a week after 06/27/22   Selmer Dominion, NP  predniSONE (DELTASONE) 50 MG tablet 1 tablet PO QD X4 days 07/08/22  Yes Zadie Rhine, MD  amLODipine (NORVASC) 10 MG tablet Take 1 tablet (10 mg total) by mouth daily. 02/22/19   Brock Bad, MD  Blood Pressure Monitoring (BLOOD PRESSURE KIT) DEVI 1 mL by Does not apply route 3 (three) times daily. 11/30/18   Grayce Sessions, NP  hydrochlorothiazide (HYDRODIURIL) 25 MG tablet Take 1 tablet (25 mg total) by mouth daily. 02/22/19   Brock Bad, MD  montelukast  (SINGULAIR) 10 MG tablet Take 10 mg by mouth daily.    [provider]  OZEMPIC, 1 MG/DOSE, 4 MG/3ML SOPN Inject 1 mg into the skin once a week. 03/08/22   [provider]  polyethylene glycol powder (GLYCOLAX/MIRALAX) 17 GM/SCOOP powder Take 17 g by mouth daily. Drink 17g (1 scoop) dissolved in water per day. 06/04/20   Marguerita Beards, MD  Vitamin D, Ergocalciferol, (DRISDOL) 1.25 MG (50000 UNIT) CAPS capsule Take 50,000 Units by mouth every 7 (seven) days. TUESDAYS 04/24/20   [provider]      Allergies    Tape    Review of Systems   Review of Systems  Physical Exam Updated Vital Signs BP 118/86   Pulse 88   Temp 98 F (36.7 C) (Oral)   Resp 16   Ht 1.676 m (5\' 6" )   Wt 81.6 kg   LMP 08/31/2020   SpO2 99%   BMI 29.05 kg/m  Physical Exam CONSTITUTIONAL: Well developed/well nourished, no distress HEAD: Normocephalic/atraumatic EYES: EOMI/PERRL ENMT: Mucous membranes moist, no angioedema, no stridor or drooling NECK: supple no meningeal signs NEURO: Pt is awake/alert/appropriate, moves all extremitiesx4.  No facial droop.   SKIN: warm, scattered rash noted throughout extremities and torso.  Urticaria are also noted. There is no active drainage.  No evidence of secondary infection. With female staff member  present, buttocks was examined.  A very small area of a possible cyst is formed, but no erythema, no drainage, no fluctuance.  There is minimal tenderness.  There is no perirectal abscess. PSYCH: no abnormalities of mood noted, alert and oriented to situation  ED Results / Procedures / Treatments   Labs (all labs ordered are listed, but only abnormal results are displayed) Labs Reviewed - No data to display  EKG None  Radiology No results found.  Procedures Procedures    Medications Ordered in ED Medications  predniSONE (DELTASONE) tablet 60 mg (60 mg Oral Given 07/08/22 2315)    ED Course/ Medical Decision Making/ A&P                              Medical Decision Making Risk Prescription drug management.   Plan to treat her rash with Benadryl and steroids for the next 5 days.  No signs of anaphylaxis.  If no improvement next week, she will need to have a follow-up  Patient was concerned about a developing pilonidal cyst, but this does not appear to be actively infected.  There is no erythema, no discharge, no fluctuance. Will defer treatment for now        Final Clinical Impression(s) / ED Diagnoses Final diagnoses:  Rash  Pilonidal cyst    Rx / DC Orders ED Discharge Orders          Ordered    predniSONE (DELTASONE) 50 MG tablet        07/08/22 2310              Zadie Rhine, MD 07/09/22 [phone]

## 2022-07-12 DIAGNOSIS — E119 Type 2 diabetes mellitus without complications: Secondary | ICD-10-CM | POA: Diagnosis not present

## 2022-07-12 DIAGNOSIS — Z0130 Encounter for examination of blood pressure without abnormal findings: Secondary | ICD-10-CM | POA: Diagnosis not present

## 2022-07-12 DIAGNOSIS — I10 Essential (primary) hypertension: Secondary | ICD-10-CM | POA: Diagnosis not present

## 2022-07-12 DIAGNOSIS — Z3200 Encounter for pregnancy test, result unknown: Secondary | ICD-10-CM | POA: Diagnosis not present

## 2022-07-12 DIAGNOSIS — Z760 Encounter for issue of repeat prescription: Secondary | ICD-10-CM | POA: Diagnosis not present

## 2022-07-12 DIAGNOSIS — R7301 Impaired fasting glucose: Secondary | ICD-10-CM | POA: Diagnosis not present

## 2022-07-12 DIAGNOSIS — Z79899 Other long term (current) drug therapy: Secondary | ICD-10-CM | POA: Diagnosis not present

## 2022-07-12 DIAGNOSIS — R0602 Shortness of breath: Secondary | ICD-10-CM | POA: Diagnosis not present

## 2022-07-12 DIAGNOSIS — R635 Abnormal weight gain: Secondary | ICD-10-CM | POA: Diagnosis not present

## 2022-07-12 DIAGNOSIS — R632 Polyphagia: Secondary | ICD-10-CM | POA: Diagnosis not present

## 2022-07-12 DIAGNOSIS — Z6829 Body mass index (BMI) 29.0-29.9, adult: Secondary | ICD-10-CM | POA: Diagnosis not present

## 2022-07-25 ENCOUNTER — Other Ambulatory Visit: Payer: Self-pay | Admitting: Obstetrics and Gynecology

## 2022-07-27 DIAGNOSIS — R822 Biliuria: Secondary | ICD-10-CM | POA: Diagnosis not present

## 2022-07-27 DIAGNOSIS — Z6829 Body mass index (BMI) 29.0-29.9, adult: Secondary | ICD-10-CM | POA: Diagnosis not present

## 2022-07-27 DIAGNOSIS — E119 Type 2 diabetes mellitus without complications: Secondary | ICD-10-CM | POA: Diagnosis not present

## 2022-07-27 DIAGNOSIS — I10 Essential (primary) hypertension: Secondary | ICD-10-CM | POA: Diagnosis not present

## 2022-07-27 DIAGNOSIS — R350 Frequency of micturition: Secondary | ICD-10-CM | POA: Diagnosis not present

## 2022-07-27 DIAGNOSIS — Z3200 Encounter for pregnancy test, result unknown: Secondary | ICD-10-CM | POA: Diagnosis not present

## 2022-08-10 DIAGNOSIS — Z6829 Body mass index (BMI) 29.0-29.9, adult: Secondary | ICD-10-CM | POA: Diagnosis not present

## 2022-08-10 DIAGNOSIS — I10 Essential (primary) hypertension: Secondary | ICD-10-CM | POA: Diagnosis not present

## 2022-08-10 DIAGNOSIS — R632 Polyphagia: Secondary | ICD-10-CM | POA: Diagnosis not present

## 2022-08-10 DIAGNOSIS — R635 Abnormal weight gain: Secondary | ICD-10-CM | POA: Diagnosis not present

## 2022-08-10 DIAGNOSIS — Z3200 Encounter for pregnancy test, result unknown: Secondary | ICD-10-CM | POA: Diagnosis not present

## 2022-08-10 DIAGNOSIS — R7301 Impaired fasting glucose: Secondary | ICD-10-CM | POA: Diagnosis not present

## 2022-08-10 DIAGNOSIS — R9431 Abnormal electrocardiogram [ECG] [EKG]: Secondary | ICD-10-CM | POA: Diagnosis not present

## 2022-08-30 DIAGNOSIS — U071 COVID-19: Secondary | ICD-10-CM | POA: Diagnosis not present

## 2022-08-30 DIAGNOSIS — R059 Cough, unspecified: Secondary | ICD-10-CM | POA: Diagnosis not present

## 2022-09-09 DIAGNOSIS — R7301 Impaired fasting glucose: Secondary | ICD-10-CM | POA: Diagnosis not present

## 2022-09-09 DIAGNOSIS — I10 Essential (primary) hypertension: Secondary | ICD-10-CM | POA: Diagnosis not present

## 2022-09-09 DIAGNOSIS — R635 Abnormal weight gain: Secondary | ICD-10-CM | POA: Diagnosis not present

## 2022-09-09 DIAGNOSIS — A499 Bacterial infection, unspecified: Secondary | ICD-10-CM | POA: Diagnosis not present

## 2022-09-09 DIAGNOSIS — Z6827 Body mass index (BMI) 27.0-27.9, adult: Secondary | ICD-10-CM | POA: Diagnosis not present

## 2022-09-09 DIAGNOSIS — Z3200 Encounter for pregnancy test, result unknown: Secondary | ICD-10-CM | POA: Diagnosis not present

## 2022-09-09 DIAGNOSIS — N390 Urinary tract infection, site not specified: Secondary | ICD-10-CM | POA: Diagnosis not present

## 2022-09-09 DIAGNOSIS — R300 Dysuria: Secondary | ICD-10-CM | POA: Diagnosis not present

## 2022-10-25 DIAGNOSIS — Z6827 Body mass index (BMI) 27.0-27.9, adult: Secondary | ICD-10-CM | POA: Diagnosis not present

## 2022-10-25 DIAGNOSIS — Z3200 Encounter for pregnancy test, result unknown: Secondary | ICD-10-CM | POA: Diagnosis not present

## 2022-10-25 DIAGNOSIS — J302 Other seasonal allergic rhinitis: Secondary | ICD-10-CM | POA: Diagnosis not present

## 2022-10-25 DIAGNOSIS — I10 Essential (primary) hypertension: Secondary | ICD-10-CM | POA: Diagnosis not present

## 2022-10-25 DIAGNOSIS — R7301 Impaired fasting glucose: Secondary | ICD-10-CM | POA: Diagnosis not present

## 2022-10-25 DIAGNOSIS — R635 Abnormal weight gain: Secondary | ICD-10-CM | POA: Diagnosis not present

## 2022-10-25 DIAGNOSIS — E559 Vitamin D deficiency, unspecified: Secondary | ICD-10-CM | POA: Diagnosis not present

## 2022-11-04 ENCOUNTER — Telehealth: Payer: Self-pay | Admitting: *Deleted

## 2022-11-04 ENCOUNTER — Ambulatory Visit (INDEPENDENT_AMBULATORY_CARE_PROVIDER_SITE_OTHER): Payer: Medicaid Other | Admitting: General Practice

## 2022-11-04 VITALS — BP 126/85 | HR 76 | Ht 66.0 in | Wt 165.0 lb

## 2022-11-04 DIAGNOSIS — R350 Frequency of micturition: Secondary | ICD-10-CM

## 2022-11-04 DIAGNOSIS — R300 Dysuria: Secondary | ICD-10-CM

## 2022-11-04 LAB — POCT URINALYSIS DIPSTICK
Bilirubin, UA: NEGATIVE
Blood, UA: NEGATIVE
Glucose, UA: NEGATIVE
Ketones, UA: NEGATIVE
Leukocytes, UA: NEGATIVE
Nitrite, UA: NEGATIVE
Protein, UA: NEGATIVE
Spec Grav, UA: 1.02 (ref 1.010–1.025)
Urobilinogen, UA: 0.2 U/dL
pH, UA: 7 (ref 5.0–8.0)

## 2022-11-04 MED ORDER — PHENAZOPYRIDINE HCL 200 MG PO TABS: 200.0000 mg | ORAL_TABLET | Freq: Three times a day (TID) | ORAL | 0 refills | Status: DC | PRN

## 2022-11-04 NOTE — Telephone Encounter (Signed)
Pt called back.  I advised pt, we saw the prescription that her OBGYN gave her and will review culture as well next week and then potentially discuss possible post coital prescription.  Pt understands.  Sherrilyn Rist CMA

## 2022-11-04 NOTE — Progress Notes (Signed)
SUBJECTIVE: Emily Saunders is a 47 y.o. female who complains of urinary frequency, urgency and dysuria x 3 days, without flank pain, fever, chills, or abnormal vaginal discharge or bleeding.   OBJECTIVE: Appears well, in no apparent distress.  Vital signs are normal. Urine dipstick shows negative for all components.    ASSESSMENT: Dysuria  PLAN: Pyridium sent for symptoms per Nettie Elm, MD. Further treatment to be discussed after lab results are received.  Call or return to clinic prn if these symptoms worsen or fail to improve as anticipated.
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
## 2022-11-06 LAB — URINE CULTURE

## 2022-11-11 NOTE — Telephone Encounter (Signed)
Urine culture negative at [facility] office. KWD CMA

## 2022-11-19 MED ORDER — PHENAZOPYRIDINE HCL 200 MG PO TABS
200.0000 mg | ORAL_TABLET | Freq: Three times a day (TID) | ORAL | 0 refills | Status: AC | PRN
Start: 2022-11-19 — End: ?

## 2022-11-19 NOTE — Telephone Encounter (Signed)
Reports postcoital urinary frequency and urgency. Denies hematuria, fever, back pain.  Reports similar symptoms 11/04/22, resolved after pyridium.  Currently out of town in [place], closest 24hr pharmacy 15 mi.  Advised Azo OTC and Rx pyridium sent to [facility] in [place] per request.  Patient to seek urgent evaluation if symptoms persist or worsens.  Office to schedule visit to assess symptoms.

## 2022-11-21 NOTE — Telephone Encounter (Signed)
Patient has been scheduled

## 2022-11-24 DIAGNOSIS — R7301 Impaired fasting glucose: Secondary | ICD-10-CM | POA: Diagnosis not present

## 2022-11-24 DIAGNOSIS — E538 Deficiency of other specified B group vitamins: Secondary | ICD-10-CM | POA: Diagnosis not present

## 2022-11-24 DIAGNOSIS — I10 Essential (primary) hypertension: Secondary | ICD-10-CM | POA: Diagnosis not present

## 2022-11-24 DIAGNOSIS — Z6827 Body mass index (BMI) 27.0-27.9, adult: Secondary | ICD-10-CM | POA: Diagnosis not present

## 2022-11-24 DIAGNOSIS — F17210 Nicotine dependence, cigarettes, uncomplicated: Secondary | ICD-10-CM | POA: Diagnosis not present

## 2022-11-24 DIAGNOSIS — F17200 Nicotine dependence, unspecified, uncomplicated: Secondary | ICD-10-CM | POA: Diagnosis not present

## 2022-11-24 DIAGNOSIS — R635 Abnormal weight gain: Secondary | ICD-10-CM | POA: Diagnosis not present

## 2022-11-24 DIAGNOSIS — Z780 Asymptomatic menopausal state: Secondary | ICD-10-CM | POA: Diagnosis not present

## 2022-11-25 ENCOUNTER — Encounter: Payer: Self-pay | Admitting: Obstetrics and Gynecology

## 2022-11-25 ENCOUNTER — Other Ambulatory Visit (HOSPITAL_COMMUNITY)
Admission: RE | Admit: 2022-11-25 | Discharge: 2022-11-25 | Disposition: A | Discharge status: Home / Self Care | Payer: Medicaid Other | Source: Ambulatory Visit | Attending: Obstetrics and Gynecology | Admitting: Obstetrics and Gynecology

## 2022-11-25 ENCOUNTER — Ambulatory Visit (INDEPENDENT_AMBULATORY_CARE_PROVIDER_SITE_OTHER): Payer: Medicaid Other | Admitting: Obstetrics and Gynecology

## 2022-11-25 VITALS — BP 122/75 | HR 73

## 2022-11-25 DIAGNOSIS — R350 Frequency of micturition: Secondary | ICD-10-CM

## 2022-11-25 DIAGNOSIS — B9689 Other specified bacterial agents as the cause of diseases classified elsewhere: Secondary | ICD-10-CM

## 2022-11-25 DIAGNOSIS — N898 Other specified noninflammatory disorders of vagina: Secondary | ICD-10-CM | POA: Diagnosis not present

## 2022-11-25 DIAGNOSIS — N3281 Overactive bladder: Secondary | ICD-10-CM

## 2022-11-25 DIAGNOSIS — R82998 Other abnormal findings in urine: Secondary | ICD-10-CM

## 2022-11-25 LAB — POCT URINALYSIS DIPSTICK
Bilirubin, UA: NEGATIVE
Glucose, UA: NEGATIVE
Ketones, UA: NEGATIVE
Nitrite, UA: POSITIVE
Protein, UA: POSITIVE — AB
Spec Grav, UA: 1.02 (ref 1.010–1.025)
Urobilinogen, UA: 0.2 U/dL
pH, UA: 6.5 (ref 5.0–8.0)

## 2022-11-25 MED ORDER — SULFAMETHOXAZOLE-TRIMETHOPRIM 800-160 MG PO TABS: 1.0000 | ORAL_TABLET | ORAL | 0 refills | Status: DC | PRN

## 2022-11-25 MED ORDER — OXYBUTYNIN CHLORIDE ER 5 MG PO TB24: 5.0000 mg | ORAL_TABLET | Freq: Every day | ORAL | 1 refills | Status: AC

## 2022-11-25 MED ORDER — SULFAMETHOXAZOLE-TRIMETHOPRIM 800-160 MG PO TABS
1.0000 | ORAL_TABLET | ORAL | 0 refills | Status: DC | PRN
Start: 2022-11-25 — End: 2022-11-25

## 2022-11-25 MED ORDER — SULFAMETHOXAZOLE-TRIMETHOPRIM 800-160 MG PO TABS
1.0000 | ORAL_TABLET | Freq: Two times a day (BID) | ORAL | 0 refills | Status: AC
Start: 2022-11-25 — End: 2022-11-28

## 2022-11-25 NOTE — Progress Notes (Signed)
[facility] Return Visit  SUBJECTIVE  History of Present Illness: Emily Saunders is a 47 y.o. female seen in follow-up for bladder irritation symptoms and some recent mild pain with sex.   Patient's last two urine cultures have been negative for active infection. Patient did not start the estrogen cream and reports she wanted some clarification about how often to use it.   Patient reports she also has been having more frequency and urgency with leakage at times.   Past Medical History: Patient  has a past medical history of GERD (gastroesophageal reflux disease), H/O pilonidal cyst (2002 ), History of hiatal hernia, Hypertension, PID (acute pelvic inflammatory disease) (2006 ), Pneumonia (2016), Vitamin D deficiency (2015 ), and Wears dentures.   Past Surgical History: She  has a past surgical history that includes Cesarean section; Cyst excision (Right, 2003); Nasal sinus surgery (Bilateral, 02/10/2020); Vaginal hysterectomy (N/A, 06/08/2020); Vaginal prolapse repair (N/A, 06/08/2020); Anterior and posterior repair (N/A, 06/08/2020); and Cystoscopy (N/A, 06/08/2020).   Medications: She has a current medication list which includes the following prescription(s): amlodipine, blood pressure kit, conjugated estrogens, hydrochlorothiazide, montelukast, oxybutynin, phenazopyridine, polyethylene glycol powder, sulfamethoxazole-trimethoprim, vitamin d (ergocalciferol), ozempic (1 mg/dose), prednisone, and sulfamethoxazole-trimethoprim.   Allergies: Patient is allergic to tape.   Social History: Patient  reports that she has quit smoking. Her smoking use included cigarettes. She has a 10 pack-year smoking history. She uses smokeless tobacco. She reports that she does not drink alcohol and does not use drugs.      OBJECTIVE    Lab Results  Component Value Date   COLORU yellow 11/25/2022   CLARITYU clear 11/25/2022   GLUCOSEUR Negative 11/25/2022   BILIRUBINUR negative 11/25/2022    KETONESU negative 11/25/2022   SPECGRAV 1.020 11/25/2022   RBCUR Trace 11/25/2022   PHUR 6.5 11/25/2022   PROTEINUR Positive (A) 11/25/2022   UROBILINOGEN 0.2 11/25/2022   LEUKOCYTESUR Large (3+) (A) 11/25/2022     Physical Exam: Vitals:   11/25/22 1137  BP: 122/75  Pulse: 73   Gen: No apparent distress, A&O x 3.  Detailed Urogynecologic Evaluation:  No obvious bleeding, masses or areas of irritation on exam. Some mild white discharge that could be BV. No obvious pelvic floor muscle tension.    ASSESSMENT AND PLAN    Emily Saunders is a 47 y.o. with:  1. Urinary frequency   2. Leukocytes in urine   3. Vaginal discharge   4. OAB (overactive bladder)    POC urine positive for protein and leukocytes. Will send for culture. Patient given x3 days of Bactrim to get her through the weekend. For her symptoms that occur after intercourse we discussed using low dose bactrim x2 doses after intercourse. We also discussed using the estrogen cream nightly for 2 weeks and then twice a week after.  Sent for culture.  Patient has mild vaginal discharge on exam. Will send Aptima swab. We discussed starting the use of boric acid suppositories to assist her symptoms and if she needs treatment for BV we will do so at the appropriate time.  For patient's overactive bladder will start Oxybutynin 5mg  daily to see if this is helpful for her moderate OAB symptoms.   Patient to follow up in 3 months or sooner if needed.

## 2022-11-25 NOTE — Patient Instructions (Addendum)
Use the vaginal suppositories nightly for 1 week  Use the estrogen every night for 2 weeks. After the first two weeks then start using it just twice a week.    Cholestyramine can be helpful for your post-galbladder removal issues.  Start Oxybutynin 5mg  daily

## 2022-11-28 ENCOUNTER — Other Ambulatory Visit (HOSPITAL_COMMUNITY)
Admission: RE | Admit: 2022-11-28 | Discharge: 2022-11-28 | Disposition: A | Discharge status: Home / Self Care | Payer: Medicaid Other | Source: Other Acute Inpatient Hospital | Attending: Obstetrics and Gynecology | Admitting: Obstetrics and Gynecology

## 2022-11-28 DIAGNOSIS — R82998 Other abnormal findings in urine: Secondary | ICD-10-CM | POA: Insufficient documentation

## 2022-11-28 LAB — CERVICOVAGINAL ANCILLARY ONLY
Bacterial Vaginitis (gardnerella): POSITIVE — AB
Candida Glabrata: NEGATIVE
Candida Vaginitis: NEGATIVE
Comment: NEGATIVE
Comment: NEGATIVE
Comment: NEGATIVE
Comment: NEGATIVE
Trichomonas: NEGATIVE

## 2022-11-30 MED ORDER — METRONIDAZOLE 0.75 % VA GEL
1.0000 | Freq: Every day | VAGINAL | 0 refills | Status: AC
Start: 2022-11-30 — End: ?

## 2022-11-30 NOTE — Progress Notes (Signed)
Please let patient know she has BV. I have sent in the Metrogel for her to use vaginally for this. She should pause the estrogen cream while doing the metrogel and resume once she has finished.

## 2022-11-30 NOTE — Progress Notes (Signed)
Treated with Bactrim. Please let patient know that her urine culture was positive and this has been treated with Bactrim which she took over the weekend.

## 2022-11-30 NOTE — Addendum Note (Signed)
Addended by: Selmer Dominion on: 11/30/2022 09:34 AM   Modules accepted: Orders

## 2022-12-01 NOTE — Progress Notes (Signed)
Patient has been notified

## 2022-12-02 LAB — URINE CULTURE: Culture: >=100000 — AB

## 2022-12-07 ENCOUNTER — Ambulatory Visit: Payer: Medicaid Other | Admitting: Obstetrics and Gynecology

## 2022-12-23 ENCOUNTER — Other Ambulatory Visit: Payer: Self-pay

## 2022-12-23 DIAGNOSIS — B379 Candidiasis, unspecified: Secondary | ICD-10-CM

## 2022-12-23 DIAGNOSIS — N898 Other specified noninflammatory disorders of vagina: Secondary | ICD-10-CM

## 2022-12-23 MED ORDER — FLUCONAZOLE 150 MG PO TABS: 150.0000 mg | ORAL_TABLET | Freq: Once | ORAL | 0 refills | Status: AC

## 2022-12-23 NOTE — Progress Notes (Signed)
Patient was sent diflucan to her pharmacy and notified.

## 2022-12-26 DIAGNOSIS — R635 Abnormal weight gain: Secondary | ICD-10-CM | POA: Diagnosis not present

## 2022-12-26 DIAGNOSIS — Z23 Encounter for immunization: Secondary | ICD-10-CM | POA: Diagnosis not present

## 2022-12-26 DIAGNOSIS — R7301 Impaired fasting glucose: Secondary | ICD-10-CM | POA: Diagnosis not present

## 2022-12-26 DIAGNOSIS — E559 Vitamin D deficiency, unspecified: Secondary | ICD-10-CM | POA: Diagnosis not present

## 2022-12-26 DIAGNOSIS — Z780 Asymptomatic menopausal state: Secondary | ICD-10-CM | POA: Diagnosis not present

## 2022-12-26 DIAGNOSIS — I10 Essential (primary) hypertension: Secondary | ICD-10-CM | POA: Diagnosis not present

## 2022-12-26 DIAGNOSIS — J302 Other seasonal allergic rhinitis: Secondary | ICD-10-CM | POA: Diagnosis not present

## 2022-12-26 DIAGNOSIS — F17200 Nicotine dependence, unspecified, uncomplicated: Secondary | ICD-10-CM | POA: Diagnosis not present

## 2022-12-26 DIAGNOSIS — Z6827 Body mass index (BMI) 27.0-27.9, adult: Secondary | ICD-10-CM | POA: Diagnosis not present

## 2023-01-25 DIAGNOSIS — Z0000 Encounter for general adult medical examination without abnormal findings: Secondary | ICD-10-CM | POA: Diagnosis not present

## 2023-01-25 DIAGNOSIS — R5383 Other fatigue: Secondary | ICD-10-CM | POA: Diagnosis not present

## 2023-01-25 DIAGNOSIS — Z6827 Body mass index (BMI) 27.0-27.9, adult: Secondary | ICD-10-CM | POA: Diagnosis not present

## 2023-01-25 DIAGNOSIS — R030 Elevated blood-pressure reading, without diagnosis of hypertension: Secondary | ICD-10-CM | POA: Diagnosis not present

## 2023-01-25 DIAGNOSIS — E559 Vitamin D deficiency, unspecified: Secondary | ICD-10-CM | POA: Diagnosis not present

## 2023-02-07 ENCOUNTER — Other Ambulatory Visit: Payer: Self-pay | Admitting: Internal Medicine

## 2023-02-07 ENCOUNTER — Ambulatory Visit
Admission: RE | Admit: 2023-02-07 | Discharge: 2023-02-07 | Disposition: A | Discharge status: Home / Self Care | Payer: Medicaid Other | Source: Ambulatory Visit | Attending: Internal Medicine | Admitting: Internal Medicine

## 2023-02-07 DIAGNOSIS — Z1231 Encounter for screening mammogram for malignant neoplasm of breast: Secondary | ICD-10-CM

## 2023-02-16 ENCOUNTER — Other Ambulatory Visit: Payer: Self-pay | Admitting: Obstetrics

## 2023-02-16 DIAGNOSIS — N760 Acute vaginitis: Secondary | ICD-10-CM

## 2023-02-24 DIAGNOSIS — R7301 Impaired fasting glucose: Secondary | ICD-10-CM | POA: Diagnosis not present

## 2023-02-24 DIAGNOSIS — E559 Vitamin D deficiency, unspecified: Secondary | ICD-10-CM | POA: Diagnosis not present

## 2023-02-24 DIAGNOSIS — Z6827 Body mass index (BMI) 27.0-27.9, adult: Secondary | ICD-10-CM | POA: Diagnosis not present

## 2023-02-24 DIAGNOSIS — J302 Other seasonal allergic rhinitis: Secondary | ICD-10-CM | POA: Diagnosis not present

## 2023-02-24 DIAGNOSIS — I10 Essential (primary) hypertension: Secondary | ICD-10-CM | POA: Diagnosis not present

## 2023-03-01 DIAGNOSIS — K760 Fatty (change of) liver, not elsewhere classified: Secondary | ICD-10-CM | POA: Diagnosis not present

## 2023-03-01 DIAGNOSIS — R768 Other specified abnormal immunological findings in serum: Secondary | ICD-10-CM | POA: Diagnosis not present

## 2023-03-08 DIAGNOSIS — R1319 Other dysphagia: Secondary | ICD-10-CM | POA: Diagnosis not present

## 2023-03-08 DIAGNOSIS — K219 Gastro-esophageal reflux disease without esophagitis: Secondary | ICD-10-CM | POA: Diagnosis not present

## 2023-03-08 DIAGNOSIS — R09A2 Foreign body sensation, throat: Secondary | ICD-10-CM | POA: Diagnosis not present

## 2023-03-10 DIAGNOSIS — Z1211 Encounter for screening for malignant neoplasm of colon: Secondary | ICD-10-CM | POA: Diagnosis not present

## 2023-03-10 DIAGNOSIS — R1319 Other dysphagia: Secondary | ICD-10-CM | POA: Diagnosis not present

## 2023-03-19 DIAGNOSIS — K200 Eosinophilic esophagitis: Secondary | ICD-10-CM | POA: Diagnosis not present

## 2023-03-21 ENCOUNTER — Ambulatory Visit (INDEPENDENT_AMBULATORY_CARE_PROVIDER_SITE_OTHER): Payer: Medicaid Other | Admitting: *Deleted

## 2023-03-21 VITALS — BP 115/81 | HR 80

## 2023-03-21 DIAGNOSIS — R350 Frequency of micturition: Secondary | ICD-10-CM

## 2023-03-21 LAB — POCT URINALYSIS DIPSTICK
Appearance: NORMAL
Bilirubin, UA: NEGATIVE
Blood, UA: NEGATIVE
Glucose, UA: NEGATIVE
Ketones, UA: NEGATIVE
Leukocytes, UA: NEGATIVE
Nitrite, UA: NEGATIVE
Odor: NORMAL
Protein, UA: NEGATIVE
Spec Grav, UA: 1.015 (ref 1.010–1.025)
Urobilinogen, UA: 0.2 U/dL
pH, UA: 6.5 (ref 5.0–8.0)

## 2023-03-21 NOTE — Progress Notes (Signed)
SUBJECTIVE: Emily Saunders is a 48 y.o. female who complains of urinary frequency x several days, without flank pain, fever, chills, or abnormal vaginal discharge or bleeding.   OBJECTIVE: Appears well, in no apparent distress.  Vital signs are normal. Urine dipstick shows negative for all components.    ASSESSMENT: Dysuria  PLAN: Treatment per orders.  Call or return to clinic prn if these symptoms worsen or fail to improve as anticipated.

## 2023-03-25 LAB — URINE CULTURE

## 2023-03-27 ENCOUNTER — Encounter: Payer: Self-pay | Admitting: Family Medicine

## 2023-03-27 ENCOUNTER — Other Ambulatory Visit: Payer: Self-pay | Admitting: Family Medicine

## 2023-03-27 MED ORDER — CEFADROXIL 500 MG PO CAPS: 500.0000 mg | ORAL_CAPSULE | Freq: Two times a day (BID) | ORAL | 0 refills | Status: AC

## 2023-04-11 DIAGNOSIS — K3189 Other diseases of stomach and duodenum: Secondary | ICD-10-CM | POA: Diagnosis not present

## 2023-04-11 DIAGNOSIS — R09A2 Foreign body sensation, throat: Secondary | ICD-10-CM | POA: Diagnosis not present

## 2023-04-11 DIAGNOSIS — Z860100 Personal history of colon polyps, unspecified: Secondary | ICD-10-CM | POA: Diagnosis not present

## 2023-04-12 DIAGNOSIS — R7301 Impaired fasting glucose: Secondary | ICD-10-CM | POA: Diagnosis not present

## 2023-04-12 DIAGNOSIS — J302 Other seasonal allergic rhinitis: Secondary | ICD-10-CM | POA: Diagnosis not present

## 2023-04-12 DIAGNOSIS — Z6827 Body mass index (BMI) 27.0-27.9, adult: Secondary | ICD-10-CM | POA: Diagnosis not present

## 2023-04-12 DIAGNOSIS — I10 Essential (primary) hypertension: Secondary | ICD-10-CM | POA: Diagnosis not present

## 2023-04-12 DIAGNOSIS — E559 Vitamin D deficiency, unspecified: Secondary | ICD-10-CM | POA: Diagnosis not present

## 2023-05-10 DIAGNOSIS — R7301 Impaired fasting glucose: Secondary | ICD-10-CM | POA: Diagnosis not present

## 2023-05-10 DIAGNOSIS — Z6827 Body mass index (BMI) 27.0-27.9, adult: Secondary | ICD-10-CM | POA: Diagnosis not present

## 2023-05-10 DIAGNOSIS — R5383 Other fatigue: Secondary | ICD-10-CM | POA: Diagnosis not present

## 2023-05-10 DIAGNOSIS — E559 Vitamin D deficiency, unspecified: Secondary | ICD-10-CM | POA: Diagnosis not present

## 2023-05-10 DIAGNOSIS — I10 Essential (primary) hypertension: Secondary | ICD-10-CM | POA: Diagnosis not present

## 2023-06-01 DIAGNOSIS — F4325 Adjustment disorder with mixed disturbance of emotions and conduct: Secondary | ICD-10-CM | POA: Diagnosis not present

## 2023-06-08 DIAGNOSIS — K3189 Other diseases of stomach and duodenum: Secondary | ICD-10-CM | POA: Diagnosis not present

## 2023-06-20 ENCOUNTER — Ambulatory Visit

## 2023-06-20 ENCOUNTER — Other Ambulatory Visit (HOSPITAL_COMMUNITY)
Admission: RE | Admit: 2023-06-20 | Discharge: 2023-06-20 | Disposition: A | Discharge status: Home / Self Care | Source: Ambulatory Visit | Attending: Obstetrics & Gynecology | Admitting: Obstetrics & Gynecology

## 2023-06-20 VITALS — BP 133/90 | HR 89

## 2023-06-20 DIAGNOSIS — N898 Other specified noninflammatory disorders of vagina: Secondary | ICD-10-CM

## 2023-06-20 NOTE — Progress Notes (Signed)
 SUBJECTIVE:  48 y.o. female complains of vaginal itching intermittently for the past few days. Denies abnormal vaginal bleeding or significant pelvic pain or fever. No UTI symptoms. Denies history of known exposure to STD.   OBJECTIVE:  She appears well, afebrile. Urine dipstick: not done.  ASSESSMENT:  Vaginal Discharge  Vaginal Itching   PLAN:  GC, chlamydia, trichomonas, BVAG, CVAG probe sent to lab. Treatment: To be determined once lab results are received ROV prn if symptoms persist or worsen.

## 2023-06-21 LAB — CERVICOVAGINAL ANCILLARY ONLY
Bacterial Vaginitis (gardnerella): NEGATIVE
Candida Glabrata: NEGATIVE
Candida Vaginitis: NEGATIVE
Chlamydia: NEGATIVE
Comment: NEGATIVE
Comment: NEGATIVE
Comment: NEGATIVE
Comment: NEGATIVE
Comment: NEGATIVE
Comment: NORMAL
Neisseria Gonorrhea: NEGATIVE
Trichomonas: NEGATIVE

## 2023-07-06 ENCOUNTER — Encounter (HOSPITAL_COMMUNITY): Payer: Self-pay | Admitting: Emergency Medicine

## 2023-07-06 ENCOUNTER — Other Ambulatory Visit: Payer: Self-pay

## 2023-07-06 ENCOUNTER — Emergency Department (HOSPITAL_COMMUNITY)
Admission: EM | Admit: 2023-07-06 | Discharge: 2023-07-06 | Disposition: A | Discharge status: Home / Self Care | Attending: Emergency Medicine | Admitting: Emergency Medicine

## 2023-07-06 DIAGNOSIS — R519 Headache, unspecified: Secondary | ICD-10-CM | POA: Diagnosis present

## 2023-07-06 MED ORDER — DIPHENHYDRAMINE HCL 25 MG PO CAPS
25.0000 mg | ORAL_CAPSULE | Freq: Once | ORAL | Status: AC
  Administered 2023-07-06: 25 mg via ORAL
  Filled 2023-07-06: qty 1

## 2023-07-06 MED ORDER — DEXAMETHASONE SODIUM PHOSPHATE 4 MG/ML IJ SOLN
4.0000 mg | Freq: Once | INTRAMUSCULAR | Status: AC
  Administered 2023-07-06: 4 mg via INTRAMUSCULAR
  Filled 2023-07-06: qty 1

## 2023-07-06 MED ORDER — PROCHLORPERAZINE MALEATE 10 MG PO TABS
10.0000 mg | ORAL_TABLET | Freq: Once | ORAL | Status: AC
Start: 2023-07-06 — End: 2023-07-06
  Administered 2023-07-06: 10 mg via ORAL
  Filled 2023-07-06: qty 1

## 2023-07-06 NOTE — ED Provider Triage Note (Signed)
 Emergency Medicine Provider Triage Evaluation Note  Emily Saunders , a 48 y.o. female  was evaluated in triage.  Pt complains of left-sided posterior headache that began this morning.  Patient describes it as a sharp pain that does not radiate.  Patient has any ear pain or fevers neck pain, vision changes, jaw claudication, chest pain shortness of breath.  Patient tried [facility] powder along with ibuprofen  and no relief.  Patient denies any weakness paresthesias inability to walk.  Review of Systems  Positive: Negative:   Physical Exam  BP (!) 131/99   Pulse 71   Temp 98 F (36.7 C) (Oral)   Resp 16   Ht 5\' 6"  (1.676 m)   Wt 79.8 kg   LMP 08/31/2020   SpO2 100%   BMI 28.41 kg/m  Gen:   Awake, no distress   Resp:  Normal effort  MSK:   Moves extremities without difficulty  Other:  Cranial nerves III through XII intact, vision grossly intact, pupils PERRL bilaterally, no jaw claudication noted on exam, able to ambulate out difficulty  Medical Decision Making  Medically screening exam initiated at 6:47 PM.  Appropriate orders placed.  Emily Saunders was informed that the remainder of the evaluation will be completed by another provider, this initial triage assessment does not replace that evaluation, and the importance of remaining in the ED until their evaluation is complete.  Workup initiated, will give medications, patient stable.   Denese Finn, PA-C 07/06/23 [phone]

## 2023-07-06 NOTE — ED Provider Notes (Signed)
 [place] EMERGENCY DEPARTMENT AT [facility] Provider Note   CSN: [id number] Arrival date & time: 07/06/23  1836     History  Chief Complaint  Patient presents with   Headache    Emily Saunders is a 48 y.o. female complains of left-sided posterior headache that began this morning.  Patient describes it as a sharp pain that does not radiate.  Patient has any ear pain or fevers neck pain, vision changes, jaw claudication, chest pain shortness of breath.  Patient tried [facility] powder along with ibuprofen  and no relief.  Patient denies any weakness paresthesias inability to walk.   Home Medications Prior to Admission medications   Medication Sig Start Date End Date Taking? Authorizing Provider  amLODipine  (NORVASC ) 10 MG tablet Take 1 tablet (10 mg total) by mouth daily. 02/22/19   Gabrielle Joiner, MD  Blood Pressure Monitoring (BLOOD PRESSURE KIT) DEVI 1 mL by Does not apply route 3 (three) times daily. 11/30/18   Marius Siemens, NP  cefadroxil  (DURICEF) 500 MG capsule Take 1 capsule (500 mg total) by mouth 2 (two) times daily. 03/27/23   Cresenzo, John V, MD  conjugated estrogens  (PREMARIN ) vaginal cream Place 1 Applicatorful vaginally 2 (two) times a week. Place 0.5g nightly for two weeks then twice a week after 06/27/22   Zuleta, Kaitlin G, NP  hydrochlorothiazide  (HYDRODIURIL ) 25 MG tablet Take 1 tablet (25 mg total) by mouth daily. 02/22/19   Gabrielle Joiner, MD  metroNIDAZOLE  (FLAGYL ) 500 MG tablet TAKE 1 TABLET(500 MG) BY MOUTH TWICE DAILY Patient not taking: Reported on 06/20/2023 02/17/23   Gabrielle Joiner, MD  metroNIDAZOLE  (METROGEL ) 0.75 % vaginal gel Place 1 Applicatorful vaginally at bedtime. Use for 5 nights. Patient not taking: Reported on 06/20/2023 11/30/22   Zuleta, Kaitlin G, NP  montelukast  (SINGULAIR ) 10 MG tablet Take 10 mg by mouth daily.    [provider]  oxybutynin  (DITROPAN -XL) 5 MG 24 hr tablet Take 1 tablet (5 mg total) by mouth daily.  11/25/22   Zuleta, Kaitlin G, NP  phenazopyridine  (PYRIDIUM ) 200 MG tablet Take 1 tablet (200 mg total) by mouth 3 (three) times daily as needed for pain. Patient not taking: Reported on 06/20/2023 11/19/22   Wyonia Hefty T, MD  polyethylene glycol powder (GLYCOLAX /MIRALAX ) 17 GM/SCOOP powder Take 17 g by mouth daily. Drink 17g (1 scoop) dissolved in water  per day. 06/04/20   Arma Lamp, MD  sulfamethoxazole -trimethoprim  (BACTRIM  DS) 800-160 MG tablet Take 1 tablet by mouth as needed. Take one tablet after intercourse and then the day after as well Patient not taking: Reported on 06/20/2023 11/25/22   Zuleta, Kaitlin G, NP  Vitamin D , Ergocalciferol , (DRISDOL) 1.25 MG (50000 UNIT) CAPS capsule Take 50,000 Units by mouth every 7 (seven) days. TUESDAYS 04/24/20   [provider]      Allergies    Tape    Review of Systems   Review of Systems  Neurological:  Positive for headaches.    Physical Exam Updated Vital Signs BP (!) 131/99   Pulse 71   Temp 98 F (36.7 C) (Oral)   Resp 16   Ht 5\' 6"  (1.676 m)   Wt 79.8 kg   LMP 08/31/2020   SpO2 100%   BMI 28.41 kg/m  Physical Exam Constitutional:      Appearance: She is normal weight.  HENT:     Head: Normocephalic.     Comments: No temporal artery tenderness No jaw claudication  Right Ear: Tympanic membrane, ear canal and external ear normal.     Left Ear: Tympanic membrane, ear canal and external ear normal.     Ears:     Comments: No mastoid tenderness ecchymosis or fluctuance noted Eyes:     Extraocular Movements: Extraocular movements intact.     Conjunctiva/sclera: Conjunctivae normal.     Pupils: Pupils are equal, round, and reactive to light.  Cardiovascular:     Rate and Rhythm: Normal rate and regular rhythm.     Pulses: Normal pulses.     Heart sounds: Normal heart sounds.  Pulmonary:     Effort: Pulmonary effort is normal.     Breath sounds: Normal breath sounds.  Abdominal:     Palpations:  Abdomen is soft.     Tenderness: There is no abdominal tenderness. There is no guarding or rebound.  Musculoskeletal:        General: Normal range of motion.     Cervical back: Normal range of motion. No rigidity or tenderness.  Skin:    General: Skin is warm and dry.     Capillary Refill: Capillary refill takes less than 2 seconds.  Neurological:     General: No focal deficit present.     Mental Status: She is alert.     Sensory: Sensation is intact.     Motor: Motor function is intact.     Coordination: Coordination is intact.     Gait: Gait is intact.     Comments: Cranial nerves III through XII intact Vision grossly intact Sensation tact in all 4 extremities  Psychiatric:        Mood and Affect: Mood normal.     ED Results / Procedures / Treatments   Labs (all labs ordered are listed, but only abnormal results are displayed) Labs Reviewed - No data to display  EKG None  Radiology No results found.  Procedures Procedures    Medications Ordered in ED Medications  prochlorperazine  (COMPAZINE ) tablet 10 mg (10 mg Oral Given 07/06/23 1928)  diphenhydrAMINE  (BENADRYL ) capsule 25 mg (25 mg Oral Given 07/06/23 1928)  dexamethasone  (DECADRON ) injection 4 mg (4 mg Intramuscular Given 07/06/23 2123)    ED Course/ Medical Decision Making/ A&P                                 Medical Decision Making Risk Prescription drug management.   Emily Saunders 48 y.o. presented today for headache. Working DDx that I considered at this time includes, but not limited to, tension headache, migraine, intracranial mass, intracranial hemorrhage, intracranial infection including meningitis vs encephalitis, GCA, trigeminal neuralgia, AVM, sinusitis, cerebral aneurysm, muscular headache, cavernous sinus thrombosis, carotid artery dissection.  R/o DDx: intracranial mass, intracranial hemorrhage, intracranial infection including meningitis vs encephalitis, GCA, trigeminal neuralgia, AVM,  sinusitis, cerebral aneurysm, muscular headache, cavernous sinus thrombosis, carotid artery dissection:  less likely due to history of present illness, physical exam, labs/imaging findings  Review of prior external notes: 06/08/2023 outpatient visit  Unique Tests and My Independent Interpretation: None  Social Determinants of Health: none  Discussion with Independent Historian: Daughter  Discussion of Management of Tests: None  Risk: Medium: prescription drug management  Risk Stratification Score: None  Plan: On exam patient was no acute distress with stable vitals. Physical exam was ultimately unremarkable as patient is intact neuroexam and ENT exam along with physical exam. Presentation is like pts typical HA and non concerning  for [facility], ICH, Meningitis, temporal arteritis, or other life-threatening headaches. Pt is afebrile with no focal neuro deficits, nuchal rigidity, or change in vision. Patient will be given Compazine  and Benadryl  for HA treatment. Patient stable at this time.  Upon reevaluation patient states she is still having shooting pains in the left occipital portion of her head will try Decadron .  Upon reevaluation patient stated that she was still having a headache.  I did offer further blood test and imaging given that patient has failed 3 medications at this point however patient states she would like to go home as she needs to get up at 4 AM tomorrow morning for work.  Given that patient has reassuring physical exam and no neurologic deficits and does not appear in acute distress and stable vitals do feel this is reasonable however we did discuss close follow-up with her primary care provider along with Tylenol  or ibuprofen  every 6 hours as needed for pain.  I offered a work note however patient declined.  Patient was given return precautions. Patient stable for discharge at this time.  Patient verbalized understanding of plan.  This chart was dictated using voice  recognition software.  Despite best efforts to proofread,  errors can occur which can change the documentation meaning.        Final Clinical Impression(s) / ED Diagnoses Final diagnoses:  Bad headache    Rx / DC Orders ED Discharge Orders     None         Elex Grimmer 07/06/23 2204    Afton Horse T, DO 07/08/23 1533

## 2023-07-06 NOTE — Discharge Instructions (Addendum)
 We saw you in the ER for headaches. All the labs and imaging are normal. We are not sure what is causing your headaches, however, you have a reassuring physical exam.  We did offer further imaging and blood test however at your request we did not pursue this however symptoms change or worsen please return as we can perform these tests to further evaluate.  Please take motrin /tylenol  round the clock for the next 6 hours, remain hydrated, and take other meds prescribed only for break through pain. See your doctor if the pain persists, as you might need better medications or a specialist.

## 2023-07-06 NOTE — ED Triage Notes (Signed)
 Patient comes in with sharp pains from her left ear to the top of her head. She is now experience nausea.

## 2023-07-09 ENCOUNTER — Other Ambulatory Visit: Payer: Self-pay

## 2023-07-09 ENCOUNTER — Emergency Department (HOSPITAL_COMMUNITY)
Admission: EM | Admit: 2023-07-09 | Discharge: 2023-07-09 | Disposition: A | Discharge status: Home / Self Care | Attending: Emergency Medicine | Admitting: Emergency Medicine

## 2023-07-09 ENCOUNTER — Encounter (HOSPITAL_COMMUNITY): Payer: Self-pay

## 2023-07-09 ENCOUNTER — Emergency Department (HOSPITAL_COMMUNITY)

## 2023-07-09 DIAGNOSIS — Z79899 Other long term (current) drug therapy: Secondary | ICD-10-CM | POA: Diagnosis not present

## 2023-07-09 DIAGNOSIS — R519 Headache, unspecified: Secondary | ICD-10-CM | POA: Diagnosis not present

## 2023-07-09 DIAGNOSIS — I10 Essential (primary) hypertension: Secondary | ICD-10-CM | POA: Insufficient documentation

## 2023-07-09 LAB — CBC WITH DIFFERENTIAL/PLATELET
Abs Immature Granulocytes: 0.01 10*3/uL (ref 0.00–0.07)
Basophils Absolute: 0 10*3/uL (ref 0.0–0.1)
Basophils Relative: 1 %
Eosinophils Absolute: 0.2 10*3/uL (ref 0.0–0.5)
Eosinophils Relative: 3 %
HCT: 34.5 % — ABNORMAL LOW (ref 36.0–46.0)
Hemoglobin: 11.5 g/dL — ABNORMAL LOW (ref 12.0–15.0)
Immature Granulocytes: 0 %
Lymphocytes Relative: 46 %
Lymphs Abs: 2.5 10*3/uL (ref 0.7–4.0)
MCH: 29.7 pg (ref 26.0–34.0)
MCHC: 33.3 g/dL (ref 30.0–36.0)
MCV: 89.1 fL (ref 80.0–100.0)
Monocytes Absolute: 0.3 10*3/uL (ref 0.1–1.0)
Monocytes Relative: 6 %
Neutro Abs: 2.4 10*3/uL (ref 1.7–7.7)
Neutrophils Relative %: 44 %
Platelets: 179 10*3/uL (ref 150–400)
RBC: 3.87 MIL/uL (ref 3.87–5.11)
RDW: 12.7 % (ref 11.5–15.5)
WBC: 5.4 10*3/uL (ref 4.0–10.5)
nRBC: 0 % (ref 0.0–0.2)

## 2023-07-09 LAB — COMPREHENSIVE METABOLIC PANEL WITH GFR
ALT: 12 U/L (ref 0–44)
AST: 16 U/L (ref 15–41)
Albumin: 4.1 g/dL (ref 3.5–5.0)
Alkaline Phosphatase: 39 U/L (ref 38–126)
Anion gap: 7 (ref 5–15)
BUN: 13 mg/dL (ref 6–20)
CO2: 28 mmol/L (ref 22–32)
Calcium: 8.8 mg/dL — ABNORMAL LOW (ref 8.9–10.3)
Chloride: 102 mmol/L (ref 98–111)
Creatinine, Ser: 0.92 mg/dL (ref 0.44–1.00)
GFR, Estimated: >60 mL/min (ref >60)
Glucose, Bld: 86 mg/dL (ref 70–99)
Potassium: 3.3 mmol/L — ABNORMAL LOW (ref 3.5–5.1)
Sodium: 137 mmol/L (ref 135–145)
Total Bilirubin: 0.6 mg/dL (ref 0.0–1.2)
Total Protein: 6.8 g/dL (ref 6.5–8.1)

## 2023-07-09 MED ORDER — DIPHENHYDRAMINE HCL 50 MG/ML IJ SOLN
25.0000 mg | Freq: Once | INTRAMUSCULAR | Status: AC
  Administered 2023-07-09: 25 mg via INTRAVENOUS
  Filled 2023-07-09: qty 1

## 2023-07-09 MED ORDER — METOCLOPRAMIDE HCL 5 MG/ML IJ SOLN
10.0000 mg | Freq: Once | INTRAMUSCULAR | Status: AC
  Administered 2023-07-09: 10 mg via INTRAVENOUS
  Filled 2023-07-09: qty 2

## 2023-07-09 MED ORDER — KETOROLAC TROMETHAMINE 30 MG/ML IJ SOLN
30.0000 mg | Freq: Once | INTRAMUSCULAR | Status: AC
  Administered 2023-07-09: 30 mg via INTRAVENOUS
  Filled 2023-07-09: qty 1

## 2023-07-09 NOTE — ED Notes (Signed)
 Pt reports her HA has improved and denies needing anything at this time. Dispo pending
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
## 2023-07-09 NOTE — Discharge Instructions (Signed)
Follow-up with your family doctor if not improving 

## 2023-07-09 NOTE — ED Provider Notes (Signed)
 [place] [facility] AT [facility] Provider Note   CSN: [id number] Arrival date & time: 07/09/23  2022     History {Add pertinent medical, surgical, social history, OB history to HPI:1} Chief Complaint  Patient presents with   Headache    Emily Saunders is a 48 y.o. female.  Patient has a history of hypertension.  Patient complains of a headache.   Headache      Home Medications Prior to Admission medications   Medication Sig Start Date End Date Taking? Authorizing Provider  cetirizine  (ZYRTEC ) 10 MG tablet Take 10 mg by mouth daily. 07/09/23  Yes [provider]  fluticasone  (FLONASE ) 50 MCG/ACT nasal spray Place 2 sprays into both nostrils daily. 04/12/23  Yes [provider]  hydrochlorothiazide  (HYDRODIURIL ) 25 MG tablet Take 1 tablet (25 mg total) by mouth daily. 02/22/19  Yes Gabrielle Joiner, MD  metroNIDAZOLE  (METROGEL ) 0.75 % vaginal gel Place 1 Applicatorful vaginally at bedtime. Use for 5 nights. 11/30/22  Yes Zuleta, Kaitlin G, NP  montelukast  (SINGULAIR ) 10 MG tablet Take 10 mg by mouth daily.   Yes [provider]  Vitamin D , Ergocalciferol , (DRISDOL) 1.25 MG (50000 UNIT) CAPS capsule Take 50,000 Units by mouth every 7 (seven) days. TUESDAYS 04/24/20  Yes [provider]  amLODipine  (NORVASC ) 10 MG tablet Take 1 tablet (10 mg total) by mouth daily. Patient not taking: Reported on 07/09/2023 02/22/19   Gabrielle Joiner, MD  Blood Pressure Monitoring (BLOOD PRESSURE KIT) DEVI 1 mL by Does not apply route 3 (three) times daily. 11/30/18   Marius Siemens, NP  cefadroxil  (DURICEF) 500 MG capsule Take 1 capsule (500 mg total) by mouth 2 (two) times daily. Patient not taking: Reported on 07/09/2023 03/27/23   Cresenzo, John V, MD  conjugated estrogens  (PREMARIN ) vaginal cream Place 1 Applicatorful vaginally 2 (two) times a week. Place 0.5g nightly for two weeks then twice a week after Patient not taking: Reported on  07/09/2023 06/27/22   Zuleta, Kaitlin G, NP  metroNIDAZOLE  (FLAGYL ) 500 MG tablet TAKE 1 TABLET(500 MG) BY MOUTH TWICE DAILY Patient not taking: Reported on 06/20/2023 02/17/23   Gabrielle Joiner, MD  oxybutynin  (DITROPAN -XL) 5 MG 24 hr tablet Take 1 tablet (5 mg total) by mouth daily. Patient not taking: Reported on 07/09/2023 11/25/22   Zuleta, Kaitlin G, NP  OZEMPIC, 2 MG/DOSE, 8 MG/3ML [facility]  06/30/23   [provider]  phenazopyridine  (PYRIDIUM ) 200 MG tablet Take 1 tablet (200 mg total) by mouth 3 (three) times daily as needed for pain. Patient not taking: Reported on 06/20/2023 11/19/22   Wyonia Hefty T, MD  polyethylene glycol powder (GLYCOLAX /MIRALAX ) 17 GM/SCOOP powder Take 17 g by mouth daily. Drink 17g (1 scoop) dissolved in water  per day. Patient not taking: Reported on 07/09/2023 06/04/20   Arma Lamp, MD  sulfamethoxazole -trimethoprim  (BACTRIM  DS) 800-160 MG tablet Take 1 tablet by mouth as needed. Take one tablet after intercourse and then the day after as well Patient not taking: Reported on 06/20/2023 11/25/22   Zuleta, Kaitlin G, NP      Allergies    Tape    Review of Systems   Review of Systems  Neurological:  Positive for headaches.    Physical Exam Updated Vital Signs BP 119/86   Pulse 68   Temp 97.9 F (36.6 C) (Oral)   Resp 16   Ht 5\' 6"  (1.676 m)   Wt 79 kg   LMP 08/31/2020  SpO2 100%   BMI 28.11 kg/m  Physical Exam  ED Results / Procedures / Treatments   Labs (all labs ordered are listed, but only abnormal results are displayed) Labs Reviewed  CBC WITH DIFFERENTIAL/PLATELET - Abnormal; Notable for the following components:      Result Value   Hemoglobin 11.5 (*)    HCT 34.5 (*)    All other components within normal limits  COMPREHENSIVE METABOLIC PANEL WITH GFR - Abnormal; Notable for the following components:   Potassium 3.3 (*)    Calcium  8.8 (*)    All other components within normal limits    EKG None  Radiology CT Head Wo  Contrast Result Date: 07/09/2023 EXAM: CT HEAD WITHOUT 07/09/2023 09:02:04 PM TECHNIQUE: CT of the head was performed without the administration of intravenous contrast. Automated exposure control, iterative reconstruction, and/or weight based adjustment of the mA/kV was utilized to reduce the radiation dose to as low as reasonably achievable. COMPARISON: None available. CLINICAL HISTORY: Headache, fever. Patient was seen on Thursday for a headache and told to return if it continued. Patient reports constant sharp pain on the left side of the head with a nosebleed last night. States she is nauseated. Denies vomiting, light or sound sensitivity. FINDINGS: BRAIN AND VENTRICLES: There is no acute intracranial hemorrhage, mass effect or midline shift. No abnormal extra-axial fluid collection. The gray-white differentiation is maintained without evidence of an acute infarct. There is no evidence of hydrocephalus. ORBITS: The visualized portion of the orbits demonstrate no acute abnormality. SINUSES: The visualized paranasal sinuses and mastoid air cells demonstrate no acute abnormality. SOFT TISSUES AND SKULL: No acute abnormality of the visualized skull or soft tissues. IMPRESSION: 1. No acute intracranial abnormality. Electronically signed by: Zadie Herter MD 07/09/2023 09:07 PM EDT RP Workstation: [id number]    Procedures Procedures  {Document cardiac monitor, telemetry assessment procedure when appropriate:1}  Medications Ordered in ED Medications  ketorolac  (TORADOL ) 30 MG/ML injection 30 mg (30 mg Intravenous Given 07/09/23 2106)  metoCLOPramide  (REGLAN ) injection 10 mg (10 mg Intravenous Given 07/09/23 2108)  diphenhydrAMINE  (BENADRYL ) injection 25 mg (25 mg Intravenous Given 07/09/23 2107)    ED Course/ Medical Decision Making/ A&P   {Labs unremarkable and CT scan negative.  Patient improved with migraine cocktail Click here for ABCD2, HEART and other calculatorsREFRESH Note before signing :1}                               Medical Decision Making Amount and/or Complexity of Data Reviewed Labs: ordered. Radiology: ordered.  Risk Prescription drug management.   Patient with a bad headache that improved with migraine cocktail.  She will follow-up with PCP {Document critical care time when appropriate:1} {Document review of labs and clinical decision tools ie heart score, Chads2Vasc2 etc:1}  {Document your independent review of radiology images, and any outside records:1} {Document your discussion with family members, caretakers, and with consultants:1} {Document social determinants of health affecting pt's care:1} {Document your decision making why or why not admission, treatments were needed:1} Final Clinical Impression(s) / ED Diagnoses Final diagnoses:  Bad headache    Rx / DC Orders ED Discharge Orders     None

## 2023-07-09 NOTE — ED Triage Notes (Signed)
 PT was seen on Thursday for a headache & told to return if it continued. PT reports constant sharp on left side of head with a nose bleed last night. States she is nauseated. Denies vomiting, light or sound sensitivity.

## 2023-07-10 DIAGNOSIS — J302 Other seasonal allergic rhinitis: Secondary | ICD-10-CM | POA: Diagnosis not present

## 2023-07-10 DIAGNOSIS — Z6827 Body mass index (BMI) 27.0-27.9, adult: Secondary | ICD-10-CM | POA: Diagnosis not present

## 2023-07-10 DIAGNOSIS — Z09 Encounter for follow-up examination after completed treatment for conditions other than malignant neoplasm: Secondary | ICD-10-CM | POA: Diagnosis not present

## 2023-07-10 DIAGNOSIS — I10 Essential (primary) hypertension: Secondary | ICD-10-CM | POA: Diagnosis not present

## 2023-07-10 DIAGNOSIS — G43009 Migraine without aura, not intractable, without status migrainosus: Secondary | ICD-10-CM | POA: Diagnosis not present

## 2023-07-10 DIAGNOSIS — F17210 Nicotine dependence, cigarettes, uncomplicated: Secondary | ICD-10-CM | POA: Diagnosis not present

## 2023-07-10 DIAGNOSIS — E559 Vitamin D deficiency, unspecified: Secondary | ICD-10-CM | POA: Diagnosis not present

## 2023-07-10 DIAGNOSIS — Z1211 Encounter for screening for malignant neoplasm of colon: Secondary | ICD-10-CM | POA: Diagnosis not present

## 2023-07-10 DIAGNOSIS — R7301 Impaired fasting glucose: Secondary | ICD-10-CM | POA: Diagnosis not present

## 2023-07-28 DIAGNOSIS — R519 Headache, unspecified: Secondary | ICD-10-CM | POA: Diagnosis not present

## 2023-08-08 DIAGNOSIS — R7301 Impaired fasting glucose: Secondary | ICD-10-CM | POA: Diagnosis not present

## 2023-08-08 DIAGNOSIS — E559 Vitamin D deficiency, unspecified: Secondary | ICD-10-CM | POA: Diagnosis not present

## 2023-08-08 DIAGNOSIS — G43009 Migraine without aura, not intractable, without status migrainosus: Secondary | ICD-10-CM | POA: Diagnosis not present

## 2023-08-08 DIAGNOSIS — I10 Essential (primary) hypertension: Secondary | ICD-10-CM | POA: Diagnosis not present

## 2023-08-08 DIAGNOSIS — R232 Flushing: Secondary | ICD-10-CM | POA: Diagnosis not present

## 2023-08-08 DIAGNOSIS — F17210 Nicotine dependence, cigarettes, uncomplicated: Secondary | ICD-10-CM | POA: Diagnosis not present

## 2023-08-08 DIAGNOSIS — Z6827 Body mass index (BMI) 27.0-27.9, adult: Secondary | ICD-10-CM | POA: Diagnosis not present

## 2023-08-09 DIAGNOSIS — K3189 Other diseases of stomach and duodenum: Secondary | ICD-10-CM | POA: Diagnosis not present

## 2023-08-09 DIAGNOSIS — Z860100 Personal history of colon polyps, unspecified: Secondary | ICD-10-CM | POA: Diagnosis not present

## 2023-08-16 DIAGNOSIS — Z860100 Personal history of colon polyps, unspecified: Secondary | ICD-10-CM | POA: Diagnosis not present

## 2023-08-16 DIAGNOSIS — K635 Polyp of colon: Secondary | ICD-10-CM | POA: Diagnosis not present

## 2023-08-16 DIAGNOSIS — Z1211 Encounter for screening for malignant neoplasm of colon: Secondary | ICD-10-CM | POA: Diagnosis not present

## 2023-08-21 DIAGNOSIS — K635 Polyp of colon: Secondary | ICD-10-CM | POA: Diagnosis not present

## 2023-08-25 ENCOUNTER — Encounter: Payer: Self-pay | Admitting: Advanced Practice Midwife

## 2023-09-08 DIAGNOSIS — R232 Flushing: Secondary | ICD-10-CM | POA: Diagnosis not present

## 2023-09-08 DIAGNOSIS — Z6828 Body mass index (BMI) 28.0-28.9, adult: Secondary | ICD-10-CM | POA: Diagnosis not present

## 2023-09-08 DIAGNOSIS — R7301 Impaired fasting glucose: Secondary | ICD-10-CM | POA: Diagnosis not present

## 2023-09-08 DIAGNOSIS — I10 Essential (primary) hypertension: Secondary | ICD-10-CM | POA: Diagnosis not present

## 2023-09-08 DIAGNOSIS — F17210 Nicotine dependence, cigarettes, uncomplicated: Secondary | ICD-10-CM | POA: Diagnosis not present

## 2023-09-08 DIAGNOSIS — G43009 Migraine without aura, not intractable, without status migrainosus: Secondary | ICD-10-CM | POA: Diagnosis not present

## 2023-09-08 DIAGNOSIS — E559 Vitamin D deficiency, unspecified: Secondary | ICD-10-CM | POA: Diagnosis not present

## 2023-09-14 DIAGNOSIS — L259 Unspecified contact dermatitis, unspecified cause: Secondary | ICD-10-CM | POA: Diagnosis not present

## 2023-10-04 DIAGNOSIS — Z860100 Personal history of colon polyps, unspecified: Secondary | ICD-10-CM | POA: Diagnosis not present

## 2023-10-11 DIAGNOSIS — Z1211 Encounter for screening for malignant neoplasm of colon: Secondary | ICD-10-CM | POA: Diagnosis not present

## 2023-10-11 DIAGNOSIS — K635 Polyp of colon: Secondary | ICD-10-CM | POA: Diagnosis not present

## 2023-10-12 DIAGNOSIS — R030 Elevated blood-pressure reading, without diagnosis of hypertension: Secondary | ICD-10-CM | POA: Diagnosis not present

## 2023-10-12 DIAGNOSIS — E6609 Other obesity due to excess calories: Secondary | ICD-10-CM | POA: Diagnosis not present

## 2023-10-12 DIAGNOSIS — J3489 Other specified disorders of nose and nasal sinuses: Secondary | ICD-10-CM | POA: Diagnosis not present

## 2023-10-12 DIAGNOSIS — Z6831 Body mass index (BMI) 31.0-31.9, adult: Secondary | ICD-10-CM | POA: Diagnosis not present

## 2023-10-13 ENCOUNTER — Encounter (INDEPENDENT_AMBULATORY_CARE_PROVIDER_SITE_OTHER): Payer: Self-pay

## 2023-10-16 ENCOUNTER — Ambulatory Visit: Admitting: Family Medicine

## 2023-10-17 ENCOUNTER — Other Ambulatory Visit (INDEPENDENT_AMBULATORY_CARE_PROVIDER_SITE_OTHER): Payer: Self-pay

## 2023-10-17 ENCOUNTER — Encounter: Payer: Self-pay | Admitting: Sports Medicine

## 2023-10-17 ENCOUNTER — Ambulatory Visit (INDEPENDENT_AMBULATORY_CARE_PROVIDER_SITE_OTHER): Admitting: Sports Medicine

## 2023-10-17 DIAGNOSIS — M222X1 Patellofemoral disorders, right knee: Secondary | ICD-10-CM

## 2023-10-17 DIAGNOSIS — M222X2 Patellofemoral disorders, left knee: Secondary | ICD-10-CM

## 2023-10-17 DIAGNOSIS — Q682 Congenital deformity of knee: Secondary | ICD-10-CM

## 2023-10-17 DIAGNOSIS — M25859 Other specified joint disorders, unspecified hip: Secondary | ICD-10-CM | POA: Diagnosis not present

## 2023-10-17 DIAGNOSIS — M25551 Pain in right hip: Secondary | ICD-10-CM

## 2023-10-17 DIAGNOSIS — M25552 Pain in left hip: Secondary | ICD-10-CM | POA: Diagnosis not present

## 2023-10-17 MED ORDER — MELOXICAM 15 MG PO TABS: 15.0000 mg | ORAL_TABLET | Freq: Every day | ORAL | 0 refills | Status: DC

## 2023-10-17 NOTE — Progress Notes (Unsigned)
 Patient says that she began having pain in both hips about 3 weeks ago. She denies any injury or change in activity around that time. She points to the front of the hips when describing her pain, and says that it wraps around the sides and to the back of the hips. She denies any popping or clicking. She has not been taking any medication for the pain, but has been using a heating pad. She denies any history of hip pain or injuries. Both hips feel better today than 3 weeks ago, although they are still bothersome.  Patient has also been having bilateral knee pain for about 3 weeks. She says that the right is worse than the left, although both are better today than they were 3 weeks ago. She says that initially, both felt swollen and she felt limited in knee flexion. She was out of town at the time, so she got a knee sleeve which did help her some. She says that she does still have some tenderness above the knee in the quadriceps.

## 2023-10-17 NOTE — Progress Notes (Unsigned)
 Emily Saunders - 48 y.o. female MRN [id number]  Date of birth: 06/25/1975  Office Visit Note: Visit Date: 10/17/2023 PCP: Layman Pool, MD Referred by: Layman Pool, MD  Subjective: No chief complaint on file.  HPI: Emily Saunders is a pleasant 48 y.o. female with an orthopaedic PMH of left MCL sprain in 2021 who presents today for bilateral knee and hip pain.   Pertinent ROS were reviewed with the patient and found to be negative unless otherwise specified above in HPI.   Assessment & Plan: Visit Diagnoses: No diagnosis found.  Plan: ***  Follow-up: No follow-ups on file.   Meds & Orders: No orders of the defined types were placed in this encounter.  No orders of the defined types were placed in this encounter.    Procedures: No procedures performed      Clinical History: No specialty comments available.  She reports that she has quit smoking. Her smoking use included cigarettes. She has a 10 pack-year smoking history. She uses smokeless tobacco. No results for input(s): HGBA1C, LABURIC in the last 8760 hours.  Objective:   Vital Signs: LMP 08/31/2020   Physical Exam  Gen: Well-appearing, in no acute distress; non-toxic CV: Well-perfused. Warm.  Resp: Breathing unlabored on room air; no wheezing. Psych: Fluid speech in conversation; appropriate affect; normal thought process  Ortho Exam - ***  Imaging: No results found.  Past Medical/Family/Surgical/Social History: Medications & Allergies reviewed per EMR, new medications updated. Patient Active Problem List   Diagnosis Date Noted  . Symptomatic cholelithiasis 10/13/2020  . Pain in left knee 09/11/2019  . HTN (hypertension) 02/27/2015  . Unintended weight loss 10/27/2014  . Allergic rhinitis 06/23/2014  . Vitamin D  insufficiency 03/03/2014  . Pilonidal cyst 03/03/2014  . Broken teeth 03/03/2014  . Poor sleep pattern 03/03/2014  . Obesity (BMI 30-39.9) 03/03/2014  . Former smoker  03/03/2014  . History of anemia 05/27/2013   Past Medical History:  Diagnosis Date  . GERD (gastroesophageal reflux disease)   . H/O pilonidal cyst 2002   . History of hiatal hernia   . Hypertension   . PID (acute pelvic inflammatory disease) 2006   . Pneumonia 2016  . Vitamin D  deficiency 2015   . Wears dentures    FULL SET   Family History  Problem Relation Age of Onset  . Hypertension Mother   . Diabetes Mother   . Asthma Mother   . COPD Mother   . Depression Mother   . Miscarriages / [place] Mother   . Vision loss Mother   . Heart disease Mother   . Parkinson's disease Father   . Diabetes Maternal Aunt   . Diabetes Maternal Uncle   . Diabetes Cousin   . Breast cancer Maternal Grandmother   . Leukemia Sister    Past Surgical History:  Procedure Laterality Date  . ANTERIOR AND POSTERIOR REPAIR N/A 06/08/2020   Procedure: ANTERIOR (CYSTOCELE) AND POSTERIOR REPAIR (RECTOCELE) with perineorrhaphy;  Surgeon: Marilynne Rosaline SAILOR, MD;  Location: [facility] [place];  Service: Gynecology;  Laterality: N/A;  . CESAREAN SECTION     X 1  . CYST EXCISION Right 2003   hand  . CYSTOSCOPY N/A 06/08/2020   Procedure: CYSTOSCOPY;  Surgeon: Marilynne Rosaline SAILOR, MD;  Location: [facility];  Service: Gynecology;  Laterality: N/A;  . NASAL SINUS SURGERY Bilateral 02/10/2020   Procedure: ENDOSCOPIC SINUS SURGERY Maxillary Anstrostomy with Tissue Removal;  Surgeon: Jesus Oliphant, MD;  Location: [facility];  Service: ENT;  Laterality: Bilateral;  . VAGINAL HYSTERECTOMY N/A 06/08/2020   Procedure: HYSTERECTOMY VAGINAL with bilateral salpingectomy;  Surgeon: Marilynne Rosaline SAILOR, MD;  Location: [facility];  Service: Gynecology;  Laterality: N/A;  . VAGINAL PROLAPSE REPAIR N/A 06/08/2020   Procedure: UTEROSACRAL SUSPENSION;  Surgeon: Marilynne Rosaline SAILOR, MD;  Location: [facility];  Service: Gynecology;  Laterality: N/A;    Social History   Occupational History  . Not on file  Tobacco Use  . Smoking status: Former    Current packs/day: 0.50    Average packs/day: 0.5 packs/day for 20.0 years (10.0 ttl pk-yrs)    Types: Cigarettes  . Smokeless tobacco: Current  . Tobacco comments:    Does vape with nicotine  Vaping Use  . Vaping status: Never Used  Substance and Sexual Activity  . Alcohol use: No    Alcohol/week: 0.0 standard drinks of alcohol  . Drug use: No  . Sexual activity: Yes    Partners: Male    Birth control/protection: None, Surgical

## 2023-10-18 ENCOUNTER — Encounter: Payer: Self-pay | Admitting: Sports Medicine

## 2023-10-20 DIAGNOSIS — K635 Polyp of colon: Secondary | ICD-10-CM | POA: Diagnosis not present

## 2023-10-30 NOTE — Therapy (Incomplete)
 OUTPATIENT PHYSICAL THERAPY LOWER EXTREMITY EVALUATION   Patient Name: Emily Saunders MRN: [id number] DOB:11-15-1975, 48 y.o., female Today's Date: 10/30/2023  END OF SESSION:   Past Medical History:  Diagnosis Date   GERD (gastroesophageal reflux disease)    H/O pilonidal cyst 2002    History of hiatal hernia    Hypertension    PID (acute pelvic inflammatory disease) 2006    Pneumonia 2016   Vitamin D  deficiency 2015    Wears dentures    FULL SET   Past Surgical History:  Procedure Laterality Date   ANTERIOR AND POSTERIOR REPAIR N/A 06/08/2020   Procedure: ANTERIOR (CYSTOCELE) AND POSTERIOR REPAIR (RECTOCELE) with perineorrhaphy;  Surgeon: Marilynne Rosaline SAILOR, MD;  Location: [facility];  Service: Gynecology;  Laterality: N/A;   CESAREAN SECTION     X 1   CYST EXCISION Right 2003   hand   CYSTOSCOPY N/A 06/08/2020   Procedure: CYSTOSCOPY;  Surgeon: Marilynne Rosaline SAILOR, MD;  Location: [facility];  Service: Gynecology;  Laterality: N/A;   NASAL SINUS SURGERY Bilateral 02/10/2020   Procedure: ENDOSCOPIC SINUS SURGERY Maxillary Anstrostomy with Tissue Removal;  Surgeon: Jesus Oliphant, MD;  Location: [facility];  Service: ENT;  Laterality: Bilateral;   VAGINAL HYSTERECTOMY N/A 06/08/2020   Procedure: HYSTERECTOMY VAGINAL with bilateral salpingectomy;  Surgeon: Marilynne Rosaline SAILOR, MD;  Location: [facility];  Service: Gynecology;  Laterality: N/A;   VAGINAL PROLAPSE REPAIR N/A 06/08/2020   Procedure: UTEROSACRAL SUSPENSION;  Surgeon: Marilynne Rosaline SAILOR, MD;  Location: [facility];  Service: Gynecology;  Laterality: N/A;   Patient Active Problem List   Diagnosis Date Noted   Symptomatic cholelithiasis 10/13/2020   Pain in left knee 09/11/2019   HTN (hypertension) 02/27/2015   Unintended weight loss 10/27/2014   Allergic rhinitis 06/23/2014   Vitamin D  insufficiency 03/03/2014   Pilonidal cyst  03/03/2014   Broken teeth 03/03/2014   Poor sleep pattern 03/03/2014   Obesity (BMI 30-39.9) 03/03/2014   Former smoker 03/03/2014   History of anemia 05/27/2013    PCP: Layman Pool, MD  REFERRING PROVIDER: Burnetta Brunet, DO  REFERRING DIAG:  M22.2X1,M22.2X2 (ICD-10-CM) - Patellofemoral arthralgia of both knees M25.551,M25.552 (ICD-10-CM) - Bilateral hip pain M25.859 (ICD-10-CM) - Hip impingement syndrome, unspecified laterality  THERAPY DIAG:  No diagnosis found.  Rationale for Evaluation and Treatment: Rehabilitation  ONSET DATE: ***  SUBJECTIVE:   SUBJECTIVE STATEMENT: ***  PERTINENT HISTORY: ***  PAIN:  Are you having pain?  Yes: NPRS scale: *** Pain location: *** Pain description: *** Aggravating factors: *** Relieving factors: ***  PRECAUTIONS: {Therapy precautions:24002}  RED FLAGS: {PT Red Flags:29287}   WEIGHT BEARING RESTRICTIONS: {Yes ***/No:24003}  FALLS: {opstairs:27293} Has patient fallen in last 6 months? {fallsyesno:27318}  LIVING ENVIRONMENT: Lives with: {[facility] lives with:25569::lives with their family} Lives in: {Lives in:25570} Stairs:  Has following equipment at home: {Assistive devices:23999}  OCCUPATION: ***  PLOF: {PLOF:24004}  PATIENT GOALS: ***  NEXT MD VISIT: ***  OBJECTIVE:  Note: Objective measures were completed at Evaluation unless otherwise noted.  DIAGNOSTIC FINDINGS: ***  PATIENT SURVEYS:  LEFS  Extreme difficulty/unable (0), Quite a bit of difficulty (1), Moderate difficulty (2), Little difficulty (3), No difficulty (4) Survey date:    Any of your usual work, housework or school activities   2. Usual hobbies, recreational or sporting activities   3. Getting into/out of the bath   4. Walking between rooms   5. Putting on socks/shoes  6. Squatting    7. Lifting an object, like a bag of groceries from the floor   8. Performing light activities around your home   9. Performing heavy activities around  your home   10. Getting into/out of a car   11. Walking 2 blocks   12. Walking 1 mile   13. Going up/down 10 stairs (1 flight)   14. Standing for 1 hour   15.  sitting for 1 hour   16. Running on even ground   17. Running on uneven ground   18. Making sharp turns while running fast   19. Hopping    20. Rolling over in bed   Score total:  ***     COGNITION: Overall cognitive status: {cognition:24006}     SENSATION: {sensation:27233}  EDEMA:  {edema:24020}  MUSCLE LENGTH: Hamstrings: Right *** deg; Left *** deg Debby test: Right *** deg; Left *** deg  POSTURE: {posture:25561}  PALPATION: ***  LOWER EXTREMITY ROM:  {AROM/PROM:27142} ROM Right eval Left eval  Hip flexion    Hip extension    Hip abduction    Hip adduction    Hip internal rotation    Hip external rotation    Knee flexion    Knee extension    Ankle dorsiflexion    Ankle plantarflexion    Ankle inversion    Ankle eversion     (Blank rows = not tested)  LOWER EXTREMITY MMT:  MMT Right eval Left eval  Hip flexion    Hip extension    Hip abduction    Hip adduction    Hip internal rotation    Hip external rotation    Knee flexion    Knee extension    Ankle dorsiflexion    Ankle plantarflexion    Ankle inversion    Ankle eversion     (Blank rows = not tested)  LOWER EXTREMITY SPECIAL TESTS:  {LEspecialtests:26242}  FUNCTIONAL TESTS:  {Functional tests:24029}  GAIT: Distance walked: *** Assistive device utilized: {Assistive devices:23999} Level of assistance: {Levels of assistance:24026} Comments: ***   TREATMENT: [facility] Adult PT Treatment:                                                DATE: 10/31/2023 Therapeutic Exercise: ***  PATIENT EDUCATION:  Education details: eval findings, LEFS, HEP, POC Person educated: Patient Education method: Explanation, Demonstration, and Handouts Education comprehension: verbalized understanding and returned demonstration  HOME EXERCISE  PROGRAM: ***  ASSESSMENT:  CLINICAL IMPRESSION: Patient is a 48 y.o. F who was seen today for physical therapy evaluation and treatment for acute bilateral hip and R knee pain. Physical findings are consistent with physician impression as pt demonstrates decrease in strength and functional mobility. LEFS shows severe disability with home ADLs and higher level mobility. Pt would benefit from skilled PT services working on improving strength and mobility in order to decrease pain and improve comfort.    OBJECTIVE IMPAIRMENTS: {opptimpairments:25111}  ACTIVITY LIMITATIONS: {activitylimitations:27494}  PARTICIPATION LIMITATIONS: {participationrestrictions:25113}  PERSONAL FACTORS: {Personal factors:25162} are also affecting patient's functional outcome.   REHAB POTENTIAL: {rehabpotential:25112}  CLINICAL DECISION MAKING: {clinical decision making:25114}  EVALUATION COMPLEXITY: {Evaluation complexity:25115}   GOALS: Goals reviewed with patient? No  SHORT TERM GOALS: Target date: 11/21/2023   Pt will be compliant and knowledgeable with initial HEP for improved comfort and carryover Baseline: initial HEP given  Goal status: INITIAL  2.  Pt will self report *** pain no greater than ***/10 for improved comfort and functional ability Baseline: ***/10 at worst Goal status: {GOALSTATUS:25110}   LONG TERM GOALS: Target date: 12/26/2023   Pt will improve LEFS to no less than ***/80 as proxy for functional improvement with home ADLs and higher level community activity Baseline: ***/80 Goal status: {GOALSTATUS:25110}   2.  Pt will self report *** pain no greater than ***/10 for improved comfort and functional ability Baseline: ***/10 at worst Goal status: {GOALSTATUS:25110}   3.  Pt will increase 30 Second Sit to Stand rep count to no less than *** reps for improved balance, strength, and functional mobility Baseline: *** reps  Goal status: {GOALSTATUS:25110}   4.  *** Baseline:   Goal status: INITIAL  5.  *** Baseline:  Goal status: INITIAL    PLAN:  PT FREQUENCY: {rehab frequency:25116}  PT DURATION: {rehab duration:25117}  PLANNED INTERVENTIONS: {rehab planned interventions:25118::97110-Therapeutic exercises,97530- Therapeutic [phone]- Neuromuscular re-education,97535- Self Rjmz,02859- Manual therapy,Patient/Family education}  PLAN FOR NEXT SESSION: PIERRETTE Alm JAYSON Johna, PT 10/30/2023, 11:38 AM

## 2023-10-31 ENCOUNTER — Ambulatory Visit: Attending: Sports Medicine

## 2023-11-09 ENCOUNTER — Ambulatory Visit (INDEPENDENT_AMBULATORY_CARE_PROVIDER_SITE_OTHER)

## 2023-11-09 ENCOUNTER — Encounter (INDEPENDENT_AMBULATORY_CARE_PROVIDER_SITE_OTHER): Payer: Self-pay

## 2023-11-09 VITALS — BP 125/85 | HR 74 | Temp 98.1°F | Ht 66.0 in | Wt 200.0 lb

## 2023-11-09 DIAGNOSIS — J343 Hypertrophy of nasal turbinates: Secondary | ICD-10-CM

## 2023-11-09 DIAGNOSIS — Z9889 Other specified postprocedural states: Secondary | ICD-10-CM

## 2023-11-09 DIAGNOSIS — J320 Chronic maxillary sinusitis: Secondary | ICD-10-CM | POA: Diagnosis not present

## 2023-11-09 DIAGNOSIS — J3489 Other specified disorders of nose and nasal sinuses: Secondary | ICD-10-CM | POA: Diagnosis not present

## 2023-11-09 NOTE — Patient Instructions (Signed)
 I have ordered an imaging study for you to complete prior to your next visit. Please call [facility] Scheduling at [phone] to schedule your imaging if you have not received a call within 24 hours. If you are unable to complete your imaging study prior to your next scheduled visit please call our office to let us  know.

## 2023-11-09 NOTE — Progress Notes (Signed)
 Dear Dr. Layman, Here is my assessment for our mutual patient, Emily Saunders. Thank you for allowing me the opportunity to care for your patient. Please do not hesitate to contact me should you have any other questions. Sincerely, Dr. Hadassah Parody  Otolaryngology Clinic Note Referring provider: Dr. Layman HPI:  Emily Saunders is a 48 y.o. female kindly referred by Dr. Layman for evaluation of chronic sinusitis, hx of left sinus mass.   Pt reports she had prior sinus surgery (2022) to remove an incidentally found mass in left maxillary sinus. Path showed benign cyst. Prior to surgery she was asymptomatic.   Since surgery she has had increased nasal drainage and congestion. Drainage is mostly on the left side (same as prior surgery). Reports thick mucous and feels like its clogging her throat - feels it more in her throat than in her sinuses. Constantly blowing her nose and getting thick mucous. Getting worse.    No facial pain or pressure    Saw allergist - got allergy shots and no change  Treated GERD and no change  Personal or FHx of bleeding dz or anesthesia difficulty: no   GLP-1: yes AP/AC: no   Independent Review of Additional Tests or Records:  Op note Reyes Loader (02/10/2020) reviewed: left maxillary antrostomy with removal of sinus contents   Path report (02/10/2020): reviewed showing benign cyst    PMH/Meds/All/SocHx/FamHx/ROS:   Past Medical History:  Diagnosis Date   GERD (gastroesophageal reflux disease)    H/O pilonidal cyst 2002    History of hiatal hernia    Hypertension    PID (acute pelvic inflammatory disease) 2006    Pneumonia 2016   Vitamin D  deficiency 2015    Wears dentures    FULL SET     Past Surgical History:  Procedure Laterality Date   ANTERIOR AND POSTERIOR REPAIR N/A 06/08/2020   Procedure: ANTERIOR (CYSTOCELE) AND POSTERIOR REPAIR (RECTOCELE) with perineorrhaphy;  Surgeon: Marilynne Rosaline SAILOR, MD;  [facility];  Service: Gynecology;  Laterality: N/A;   CESAREAN SECTION     X 1   CYST EXCISION Right 2003   hand   CYSTOSCOPY N/A 06/08/2020   Procedure: CYSTOSCOPY;  Surgeon: Marilynne Rosaline SAILOR, MD;  Location: [facility];  Service: Gynecology;  Laterality: N/A;   NASAL SINUS SURGERY Bilateral 02/10/2020   Procedure: ENDOSCOPIC SINUS SURGERY Maxillary Anstrostomy with Tissue Removal;  Surgeon: Loader Oliphant, MD;  Location: [facility];  Service: ENT;  Laterality: Bilateral;   VAGINAL HYSTERECTOMY N/A 06/08/2020   Procedure: HYSTERECTOMY VAGINAL with bilateral salpingectomy;  Surgeon: Marilynne Rosaline SAILOR, MD;  Location: [facility];  Service: Gynecology;  Laterality: N/A;   VAGINAL PROLAPSE REPAIR N/A 06/08/2020   Procedure: UTEROSACRAL SUSPENSION;  Surgeon: Marilynne Rosaline SAILOR, MD;  Location: [facility];  Service: Gynecology;  Laterality: N/A;    Family History  Problem Relation Age of Onset   Hypertension Mother    Diabetes Mother    Asthma Mother    COPD Mother    Depression Mother    Miscarriages / [place] Mother    Vision loss Mother    Heart disease Mother    Parkinson's disease Father    Diabetes Maternal Aunt    Diabetes Maternal Uncle    Diabetes Cousin    Breast cancer Maternal Grandmother    Leukemia Sister      Social Connections: Not on file     Current Outpatient Medications  Medication  Instructions   amLODipine  (NORVASC ) 10 mg, Oral, Daily   Blood Pressure Monitoring (BLOOD PRESSURE KIT) DEVI 1 mL, Does not apply, 3 times daily   cefadroxil  (DURICEF) 500 mg, Oral, 2 times daily   cetirizine  (ZYRTEC ) 10 mg, Oral, Daily   conjugated estrogens  (PREMARIN ) vaginal cream 1 Applicatorful, Vaginal, 2 times weekly, Place 0.5g nightly for two weeks then twice a week after   fluticasone  (FLONASE ) 50 MCG/ACT nasal spray 2 sprays, Each Nare, Daily   hydrochlorothiazide  (HYDRODIURIL ) 25 mg, Oral, Daily   meloxicam   (MOBIC ) 15 mg, Oral, Daily   metroNIDAZOLE  (FLAGYL ) 500 MG tablet TAKE 1 TABLET(500 MG) BY MOUTH TWICE DAILY   metroNIDAZOLE  (METROGEL ) 0.75 % vaginal gel 1 Applicatorful, Vaginal, Daily at bedtime, Use for 5 nights.   montelukast  (SINGULAIR ) 10 mg, Daily   oxybutynin  (DITROPAN -XL) 5 mg, Oral, Daily   OZEMPIC, 2 MG/DOSE, 8 MG/3ML SOPN    phenazopyridine  (PYRIDIUM ) 200 mg, Oral, 3 times daily PRN   polyethylene glycol powder (GLYCOLAX /MIRALAX ) 17 g, Oral, Daily, Drink 17g (1 scoop) dissolved in water  per day.   sulfamethoxazole -trimethoprim  (BACTRIM  DS) 800-160 MG tablet 1 tablet, Oral, As needed, Take one tablet after intercourse and then the day after as well   Vitamin D  (Ergocalciferol ) (DRISDOL) 50,000 Units, Every 7 days     Physical Exam:   BP 125/85   Pulse 74   Temp 98.1 F (36.7 C)   Ht 5' 6 (1.676 m)   Wt 200 lb (90.7 kg)   LMP 08/31/2020   SpO2 96%   BMI 32.28 kg/m   Salient findings:  CN II-XII intact  Bilateral EAC clear and TM intact with well pneumatized middle ear spaces Anterior rhinoscopy: bilateral IT hypertrophy Nasal endoscopy was indicated to better evaluate the nose and paranasal sinuses, given the patient's history and exam findings, and is detailed below. No lesions of oral cavity/oropharynx No obviously palpable neck masses/lymphadenopathy/thyromegaly No respiratory distress or stridor  Seprately Identifiable Procedures:  Prior to initiating any procedures, risks/benefits/alternatives were explained to the patient and verbal consent obtained.  PROCEDURE (11/09/2023): Bilateral Diagnostic Rigid Nasal Endoscopy Pre-procedure diagnosis: Concern for chronic sinusitis, nasal drainage, hx of FESS  Post-procedure diagnosis: same Indication: See pre-procedure diagnosis and physical exam above Complications: None apparent EBL: 0 mL Anesthesia: Lidocaine  4% and topical decongestant was topically sprayed in each nasal cavity  Description of Procedure:   Patient was identified. A rigid 30 degree endoscope was utilized to evaluate the sinonasal cavities, mucosa, sinus ostia and turbinates and septum.  Overall, signs of mucosal inflammation are noted.  Also noted are significant copious mucous in left > right nasal cavity.  No purulence, polyps, or masses noted.   Right Middle meatus: clear Right SE Recess: clear drainage  Left MM: unable to visualize prior antrostomy - possible scarring  Left SE Recess: thick copious mucous  Photodocumentation was obtained.  CPT CODE -- 68768 - Mod 25   Impression & Plans:  Emily Saunders is a 48 y.o. female with   1. Chronic maxillary sinusitis   2. History of sinus surgery   3. Nasal obstruction   4. Hypertrophy of inferior nasal turbinate   5. Rhinorrhea    We had a long discussion regarding expectations of post-nasal drainage. Everyone makes 1L of mucous daily and some will notice this more than others. On patient scope exam she has evidence of significant thick mucoid drainage that is not draining appropriately. I am not able to visualize her prior antrostomy to evaluate her  sinuses.   Discussed getting a CT scan to further evaluate. It is possible she will need revision surgery to allow for appropriate drainage  See below regarding exact medications prescribed this encounter including dosages and route: No orders of the defined types were placed in this encounter.   Thank you for allowing me the opportunity to care for your patient. Please do not hesitate to contact me should you have any other questions.  Sincerely, Hadassah Parody, MD Otolaryngologist (ENT), [facility] Phone: [phone] Fax: [phone]

## 2023-11-17 ENCOUNTER — Other Ambulatory Visit: Payer: Self-pay | Admitting: Sports Medicine

## 2023-11-17 ENCOUNTER — Other Ambulatory Visit: Payer: Self-pay | Admitting: Obstetrics and Gynecology

## 2023-11-17 DIAGNOSIS — B9689 Other specified bacterial agents as the cause of diseases classified elsewhere: Secondary | ICD-10-CM

## 2023-11-20 ENCOUNTER — Other Ambulatory Visit: Payer: Self-pay

## 2023-11-20 DIAGNOSIS — B9689 Other specified bacterial agents as the cause of diseases classified elsewhere: Secondary | ICD-10-CM

## 2023-11-20 MED ORDER — METRONIDAZOLE 500 MG PO TABS: 500.0000 mg | ORAL_TABLET | Freq: Two times a day (BID) | ORAL | 0 refills | Status: AC

## 2023-11-28 ENCOUNTER — Encounter: Payer: Self-pay | Admitting: Sports Medicine

## 2023-11-28 ENCOUNTER — Ambulatory Visit: Admitting: Sports Medicine

## 2023-11-28 DIAGNOSIS — M222X2 Patellofemoral disorders, left knee: Secondary | ICD-10-CM

## 2023-11-28 DIAGNOSIS — M25552 Pain in left hip: Secondary | ICD-10-CM | POA: Diagnosis not present

## 2023-11-28 DIAGNOSIS — G5703 Lesion of sciatic nerve, bilateral lower limbs: Secondary | ICD-10-CM

## 2023-11-28 DIAGNOSIS — M25551 Pain in right hip: Secondary | ICD-10-CM

## 2023-11-28 DIAGNOSIS — M222X1 Patellofemoral disorders, right knee: Secondary | ICD-10-CM | POA: Diagnosis not present

## 2023-11-28 NOTE — Progress Notes (Unsigned)
 Emily Saunders - 48 y.o. female MRN [id number]  Date of birth: Sep 10, 1975  Office Visit Note: Visit Date: 11/28/2023 PCP: Layman Pool, MD Referred by: Layman Pool, MD  Subjective: Chief Complaint  Patient presents with   Right Hip - Pain   Right Knee - Follow-up   Left Knee - Follow-up   HPI: Emily Saunders is a pleasant 48 y.o. female who presents today for f/u of bilateral knees.  She is feeling about the same in terms of her knees since last appointment.  She unfortunately did miss her first physical therapy appointment and was unable to schedule additional formalized physical therapy,so has not done any PT yet.  She has been taking meloxicam  15 mg rather consistently, she is not noticing a big difference with this at this point.  Both of her hips are bothering her more so in the bilateral buttock region.  She does feel in general that her body is inflamed.  Hip pain specifically is worse after prolonged ambulation.  Pertinent ROS were reviewed with the patient and found to be negative unless otherwise specified above in HPI.   Assessment & Plan: Visit Diagnoses:  1. Patellofemoral arthralgia of both knees   2. Bilateral hip pain   3. Piriformis syndrome of both sides    Plan: Impression is ongoing bilateral patellofemoral arthralgia of both knees.  She unfortunately has not been able to schedule formalized physical therapy yet, I did advise her she can schedule this upon leaving today as I do think strengthening her quadriceps/VMO and creating patellar stability will certainly help her pain.  Her hips are flared up as well but more of her pain is emanating from the gluteal musculature and piriformis.  For pain relief, we did perform IM injections of Depo-Medrol  and Toradol , see procedure note below.  Would recommend she address this with formalized physical therapy as well.  Given that meloxicam  15mg  has not been helpful, we will discontinue this medication going forward.   Okay for over-the-counter anti-inflammatories only as needed.  I would like her to get started in formalized physical therapy and then we can follow-up at that point to see what next steps need to be taken.  Follow-up: Return for f/u after completing 4-6 treatments of PT.   Meds & Orders: No orders of the defined types were placed in this encounter.  No orders of the defined types were placed in this encounter.    Procedures: - 30mg /mL (1 mL) of Toradol  administered into right buttock today - 40mg /mL (1 mL) of Methylprednisolone  administered into the left buttock today      Clinical History: No specialty comments available.  She reports that she has quit smoking. Her smoking use included cigarettes. She has a 10 pack-year smoking history. She uses smokeless tobacco. No results for input(s): HGBA1C, LABURIC in the last 8760 hours.  Objective:   Vital Signs: LMP 08/31/2020   Physical Exam  Gen: Well-appearing, in no acute distress; non-toxic CV: Well-perfused. Warm.  Resp: Breathing unlabored on room air; no wheezing. Psych: Fluid speech in conversation; appropriate affect; normal thought process  Ortho Exam - Bilateral knees/hips: There isno significant redness swelling or effusion of the knees.  There is bilateral patellar crepitus, right greater than left.  There is some tenderness within the posterior hip near the piriformis musculature which worsens with deep palpation.  There is a degree of insufficiency of the bilateral hip abductor muscles.  Imaging: No results found.  Past Medical/Family/Surgical/Social History: Medications &  Allergies reviewed per EMR, new medications updated. Patient Active Problem List   Diagnosis Date Noted   Symptomatic cholelithiasis 10/13/2020   Pain in left knee 09/11/2019   HTN (hypertension) 02/27/2015   Unintended weight loss 10/27/2014   Allergic rhinitis 06/23/2014   Vitamin D  insufficiency 03/03/2014   Pilonidal cyst 03/03/2014    Broken teeth 03/03/2014   Poor sleep pattern 03/03/2014   Obesity (BMI 30-39.9) 03/03/2014   Former smoker 03/03/2014   History of anemia 05/27/2013   Past Medical History:  Diagnosis Date   GERD (gastroesophageal reflux disease)    H/O pilonidal cyst 2002    History of hiatal hernia    Hypertension    PID (acute pelvic inflammatory disease) 2006    Pneumonia 2016   Vitamin D  deficiency 2015    Wears dentures    FULL SET   Family History  Problem Relation Age of Onset   Hypertension Mother    Diabetes Mother    Asthma Mother    COPD Mother    Depression Mother    Miscarriages / [place] Mother    Vision loss Mother    Heart disease Mother    Parkinson's disease Father    Diabetes Maternal Aunt    Diabetes Maternal Uncle    Diabetes Cousin    Breast cancer Maternal Grandmother    Leukemia Sister    Past Surgical History:  Procedure Laterality Date   ANTERIOR AND POSTERIOR REPAIR N/A 06/08/2020   Procedure: ANTERIOR (CYSTOCELE) AND POSTERIOR REPAIR (RECTOCELE) with perineorrhaphy;  Surgeon: Marilynne Rosaline SAILOR, MD;  Location: [facility];  Service: Gynecology;  Laterality: N/A;   CESAREAN SECTION     X 1   CYST EXCISION Right 2003   hand   CYSTOSCOPY N/A 06/08/2020   Procedure: CYSTOSCOPY;  Surgeon: Marilynne Rosaline SAILOR, MD;  Location: [facility];  Service: Gynecology;  Laterality: N/A;   NASAL SINUS SURGERY Bilateral 02/10/2020   Procedure: ENDOSCOPIC SINUS SURGERY Maxillary Anstrostomy with Tissue Removal;  Surgeon: Jesus Oliphant, MD;  Location: [facility];  Service: ENT;  Laterality: Bilateral;   VAGINAL HYSTERECTOMY N/A 06/08/2020   Procedure: HYSTERECTOMY VAGINAL with bilateral salpingectomy;  Surgeon: Marilynne Rosaline SAILOR, MD;  Location: [facility];  Service: Gynecology;  Laterality: N/A;   VAGINAL PROLAPSE REPAIR N/A 06/08/2020   Procedure: UTEROSACRAL SUSPENSION;  Surgeon: Marilynne Rosaline SAILOR, MD;   Location: [facility];  Service: Gynecology;  Laterality: N/A;   Social History   Occupational History   Not on file  Tobacco Use   Smoking status: Former    Current packs/day: 0.50    Average packs/day: 0.5 packs/day for 20.0 years (10.0 ttl pk-yrs)    Types: Cigarettes   Smokeless tobacco: Current   Tobacco comments:    Does vape with nicotine  Vaping Use   Vaping status: Never Used  Substance and Sexual Activity   Alcohol use: No    Alcohol/week: 0.0 standard drinks of alcohol   Drug use: No   Sexual activity: Yes    Partners: Male    Birth control/protection: None, Surgical

## 2023-11-28 NOTE — Progress Notes (Unsigned)
 Patient says she is feeling the same as she was at her last appointment. She did miss her first appointment for physical therapy and has not rescheduled another one. She is taking her Meloxicam  daily with no relief.

## 2023-11-29 ENCOUNTER — Encounter: Payer: Self-pay | Admitting: Sports Medicine

## 2023-12-06 ENCOUNTER — Ambulatory Visit (HOSPITAL_COMMUNITY)
Admission: RE | Admit: 2023-12-06 | Discharge: 2023-12-06 | Disposition: A | Discharge status: Home / Self Care | Source: Ambulatory Visit

## 2023-12-06 DIAGNOSIS — J3489 Other specified disorders of nose and nasal sinuses: Secondary | ICD-10-CM | POA: Insufficient documentation

## 2023-12-06 DIAGNOSIS — J320 Chronic maxillary sinusitis: Secondary | ICD-10-CM | POA: Insufficient documentation

## 2023-12-06 DIAGNOSIS — Z9889 Other specified postprocedural states: Secondary | ICD-10-CM | POA: Diagnosis not present

## 2023-12-11 ENCOUNTER — Encounter: Payer: Self-pay | Admitting: Radiology

## 2023-12-11 DIAGNOSIS — R232 Flushing: Secondary | ICD-10-CM | POA: Diagnosis not present

## 2023-12-11 DIAGNOSIS — G43009 Migraine without aura, not intractable, without status migrainosus: Secondary | ICD-10-CM | POA: Diagnosis not present

## 2023-12-11 DIAGNOSIS — E559 Vitamin D deficiency, unspecified: Secondary | ICD-10-CM | POA: Diagnosis not present

## 2023-12-11 DIAGNOSIS — R7301 Impaired fasting glucose: Secondary | ICD-10-CM | POA: Diagnosis not present

## 2023-12-11 DIAGNOSIS — I10 Essential (primary) hypertension: Secondary | ICD-10-CM | POA: Diagnosis not present

## 2023-12-11 DIAGNOSIS — Z6828 Body mass index (BMI) 28.0-28.9, adult: Secondary | ICD-10-CM | POA: Diagnosis not present

## 2023-12-11 DIAGNOSIS — F17210 Nicotine dependence, cigarettes, uncomplicated: Secondary | ICD-10-CM | POA: Diagnosis not present

## 2023-12-18 ENCOUNTER — Encounter (INDEPENDENT_AMBULATORY_CARE_PROVIDER_SITE_OTHER): Payer: Self-pay

## 2023-12-18 ENCOUNTER — Ambulatory Visit (INDEPENDENT_AMBULATORY_CARE_PROVIDER_SITE_OTHER)

## 2023-12-18 VITALS — BP 126/84 | HR 71

## 2023-12-18 DIAGNOSIS — R0982 Postnasal drip: Secondary | ICD-10-CM | POA: Diagnosis not present

## 2023-12-18 DIAGNOSIS — R0989 Other specified symptoms and signs involving the circulatory and respiratory systems: Secondary | ICD-10-CM | POA: Diagnosis not present

## 2023-12-18 DIAGNOSIS — R09A2 Foreign body sensation, throat: Secondary | ICD-10-CM | POA: Diagnosis not present

## 2023-12-18 NOTE — Progress Notes (Addendum)
 Dear Dr. Layman, Here is my assessment for our mutual patient, Emily Saunders. Thank you for allowing me the opportunity to care for your patient. Please do not hesitate to contact me should you have any other questions. Sincerely, Dr. Hadassah Parody  Otolaryngology Clinic Note Referring provider: Dr. Layman HPI:  Emily Saunders is a 48 y.o. female kindly referred by Dr. Layman for evaluation of chronic sinusitis, hx of left sinus mass.   Pt reports she had prior sinus surgery (2022) to remove an incidentally found mass in left maxillary sinus. Path showed benign cyst. Prior to surgery she was asymptomatic.   Since surgery she has had increased nasal drainage and congestion. Drainage is mostly on the left side (same as prior surgery). Reports thick mucous and feels like its clogging her throat - feels it more in her throat than in her sinuses. Constantly blowing her nose and getting thick mucous. Getting worse.    No facial pain or pressure    Saw allergist - got allergy shots and no change  Treated GERD and no change  Personal or FHx of bleeding dz or anesthesia difficulty: no   GLP-1: yes AP/AC: no  --------------------------------------------------------- 12/18/2023  Pt here for f/u for review of CT scan.   Discussed the use of AI scribe software for clinical note transcription with the patient, who gave verbal consent to proceed.  History of Present Illness Emily Saunders is a 48 year old female who presents with persistent nasal congestion and throat mucus. Feels like this is not getting any better despite treatments.   Today reports that her problem is mostly with post-nasal drainage and globus sensation.  - Frequent sensation of mucus stuck in the throat - Attempts to clear mucus by swallowing or blowing nose - Sticky and persistent mucus requiring frequent throat clearing - Burning sensation in the throat when drinking, attributed to irritation  She throat  clears but it is not constant   Independent Review of Additional Tests or Records:  Op note Reyes Loader (02/10/2020) reviewed: left maxillary antrostomy with removal of sinus contents   Path report (02/10/2020): reviewed showing benign cyst   CT sinus independently reviewed and shows bilateral inferior turbinate hypertrophy with open maxillary sinus antrostomy with some gray thickening at floor of maxillary sinus on that side.     PMH/Meds/All/SocHx/FamHx/ROS:   Past Medical History:  Diagnosis Date   GERD (gastroesophageal reflux disease)    H/O pilonidal cyst 2002    History of hiatal hernia    Hypertension    PID (acute pelvic inflammatory disease) 2006    Pneumonia 2016   Vitamin D  deficiency 2015    Wears dentures    FULL SET     Past Surgical History:  Procedure Laterality Date   ANTERIOR AND POSTERIOR REPAIR N/A 06/08/2020   Procedure: ANTERIOR (CYSTOCELE) AND POSTERIOR REPAIR (RECTOCELE) with perineorrhaphy;  Surgeon: Marilynne Rosaline SAILOR, MD;  Location: [facility] [place];  Service: Gynecology;  Laterality: N/A;   CESAREAN SECTION     X 1   CYST EXCISION Right 2003   hand   CYSTOSCOPY N/A 06/08/2020   Procedure: CYSTOSCOPY;  Surgeon: Marilynne Rosaline SAILOR, MD;  Location: [facility];  Service: Gynecology;  Laterality: N/A;   NASAL SINUS SURGERY Bilateral 02/10/2020   Procedure: ENDOSCOPIC SINUS SURGERY Maxillary Anstrostomy with Tissue Removal;  Surgeon: Loader Oliphant, MD;  Location: [facility];  Service: ENT;  Laterality: Bilateral;   VAGINAL HYSTERECTOMY N/A 06/08/2020  Procedure: HYSTERECTOMY VAGINAL with bilateral salpingectomy;  Surgeon: Marilynne Rosaline SAILOR, MD;  Location: [facility];  Service: Gynecology;  Laterality: N/A;   VAGINAL PROLAPSE REPAIR N/A 06/08/2020   Procedure: UTEROSACRAL SUSPENSION;  Surgeon: Marilynne Rosaline SAILOR, MD;  Location: [facility];  Service: Gynecology;  Laterality: N/A;     Family History  Problem Relation Age of Onset   Hypertension Mother    Diabetes Mother    Asthma Mother    COPD Mother    Depression Mother    Miscarriages / Stillbirths Mother    Vision loss Mother    Heart disease Mother    Parkinson's disease Father    Diabetes Maternal Aunt    Diabetes Maternal Uncle    Diabetes Cousin    Breast cancer Maternal Grandmother    Leukemia Sister      Social Connections: Not on file     Current Outpatient Medications  Medication Instructions   amLODipine  (NORVASC ) 10 mg, Oral, Daily   Blood Pressure Monitoring (BLOOD PRESSURE KIT) DEVI 1 mL, Does not apply, 3 times daily   cefadroxil  (DURICEF) 500 mg, Oral, 2 times daily   cetirizine  (ZYRTEC ) 10 mg, Daily   conjugated estrogens  (PREMARIN ) vaginal cream 1 Applicatorful, Vaginal, 2 times weekly, Place 0.5g nightly for two weeks then twice a week after   fluticasone  (FLONASE ) 50 MCG/ACT nasal spray 2 sprays, Daily   gabapentin  (NEURONTIN ) 100 mg, Oral, 2 times daily   hydrochlorothiazide  (HYDRODIURIL ) 25 mg, Oral, Daily   meloxicam  (MOBIC ) 15 MG tablet TAKE 1 TABLET(15 MG) BY MOUTH DAILY   metroNIDAZOLE  (METROGEL ) 0.75 % vaginal gel 1 Applicatorful, Vaginal, Daily at bedtime, Use for 5 nights.   montelukast  (SINGULAIR ) 10 mg, Daily   oxybutynin  (DITROPAN -XL) 5 mg, Oral, Daily   OZEMPIC, 2 MG/DOSE, 8 MG/3ML SOPN    phenazopyridine  (PYRIDIUM ) 200 mg, Oral, 3 times daily PRN   polyethylene glycol powder (GLYCOLAX /MIRALAX ) 17 g, Oral, Daily, Drink 17g (1 scoop) dissolved in water  per day.   sulfamethoxazole -trimethoprim  (BACTRIM  DS) 800-160 MG tablet 1 tablet, Oral, As needed, Take one tablet after intercourse and then the day after as well   Vitamin D  (Ergocalciferol ) (DRISDOL) 50,000 Units, Every 7 days     Physical Exam:   BP 126/84 (BP Location: Left Arm, Patient Position: Sitting)   Pulse 71   LMP 08/31/2020   SpO2 98%   Salient findings:  CN II-XII intact  Bilateral EAC clear and  TM intact with well pneumatized middle ear spaces Anterior rhinoscopy: bilateral IT hypertrophy No lesions of oral cavity/oropharynx No obviously palpable neck masses/lymphadenopathy/thyromegaly No respiratory distress or stridor  Seprately Identifiable Procedures:  Prior to initiating any procedures, risks/benefits/alternatives were explained to the patient and verbal consent obtained. none  Impression & Plans:  Aaliyha Mumford is a 48 y.o. female with   1. Globus sensation   2. Post-nasal drip   3. 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 CT scan negative for excess mucus. 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 GERD excluded. Gabapentin  considered for sensitivity reduction. Speech therapy discussed. - Prescribed gabapentin  100 mg twice daily, start with nighttime dose to assess drowsiness. - Advised discontinuation if excessive drowsiness or ineffective. - Potential referral to speech therapy if gabapentin  ineffective.  Chronic left maxillary sinusitis status post sinus surgery Post-surgical status with adequate sinus opening on CT. Persistent symptoms likely due  to throat hypersensitivity, not sinus pathology. - Continue current management as CT scan shows adequate sinus opening.   See below regarding exact medications prescribed this encounter including dosages and route: Meds ordered this encounter  Medications   gabapentin  (NEURONTIN ) 100 MG capsule    Sig: Take 1 capsule (100 mg total) by mouth 2 (two) times daily.    Dispense:  60 capsule    Refill:  3    Thank you for allowing me the opportunity to care for your patient. Please do not hesitate to contact me should you have any other questions.  Sincerely, Hadassah Parody, MD Otolaryngologist (ENT), [facility] Phone: [phone] Fax:  [phone]

## 2023-12-20 MED ORDER — GABAPENTIN 100 MG PO CAPS: 100.0000 mg | ORAL_CAPSULE | Freq: Two times a day (BID) | ORAL | 3 refills | Status: AC

## 2023-12-22 ENCOUNTER — Other Ambulatory Visit: Payer: Self-pay | Admitting: Obstetrics and Gynecology

## 2023-12-22 DIAGNOSIS — R82998 Other abnormal findings in urine: Secondary | ICD-10-CM

## 2024-01-10 DIAGNOSIS — Z0000 Encounter for general adult medical examination without abnormal findings: Secondary | ICD-10-CM | POA: Diagnosis not present

## 2024-01-10 DIAGNOSIS — F17210 Nicotine dependence, cigarettes, uncomplicated: Secondary | ICD-10-CM | POA: Diagnosis not present

## 2024-01-10 DIAGNOSIS — R7301 Impaired fasting glucose: Secondary | ICD-10-CM | POA: Diagnosis not present

## 2024-01-10 DIAGNOSIS — Z6829 Body mass index (BMI) 29.0-29.9, adult: Secondary | ICD-10-CM | POA: Diagnosis not present

## 2024-01-10 DIAGNOSIS — Z1159 Encounter for screening for other viral diseases: Secondary | ICD-10-CM | POA: Diagnosis not present

## 2024-01-10 DIAGNOSIS — R0602 Shortness of breath: Secondary | ICD-10-CM | POA: Diagnosis not present

## 2024-01-10 DIAGNOSIS — E559 Vitamin D deficiency, unspecified: Secondary | ICD-10-CM | POA: Diagnosis not present

## 2024-01-10 DIAGNOSIS — I10 Essential (primary) hypertension: Secondary | ICD-10-CM | POA: Diagnosis not present

## 2024-01-11 ENCOUNTER — Other Ambulatory Visit: Payer: Self-pay

## 2024-01-11 DIAGNOSIS — Z1231 Encounter for screening mammogram for malignant neoplasm of breast: Secondary | ICD-10-CM

## 2024-01-16 ENCOUNTER — Ambulatory Visit: Admitting: Obstetrics and Gynecology

## 2024-01-16 ENCOUNTER — Encounter: Payer: Self-pay | Admitting: Obstetrics and Gynecology

## 2024-01-16 VITALS — BP 107/75 | HR 73

## 2024-01-16 DIAGNOSIS — R82998 Other abnormal findings in urine: Secondary | ICD-10-CM

## 2024-01-16 DIAGNOSIS — N3281 Overactive bladder: Secondary | ICD-10-CM

## 2024-01-16 MED ORDER — SULFAMETHOXAZOLE-TRIMETHOPRIM 800-160 MG PO TABS: 1.0000 | ORAL_TABLET | ORAL | 2 refills | Status: AC | PRN

## 2024-01-16 NOTE — Patient Instructions (Addendum)
 Please continue your self start bactrim  prior to and after intercourse. Please call if you are having issues or if things are not improving.

## 2024-01-16 NOTE — Progress Notes (Signed)
 [facility] Return Visit  SUBJECTIVE  History of Present Illness: ALECHIA LEZAMA is a 48 y.o. female seen in follow-up for OAB and recurrent UTI/BV. Patient reports that since she has been doing self-start prophylaxis she is doing well with no UTIs, no recent yeast or BV, and no issues with OAB.   Patient reports she takes one dose bactrim  piror to intercourse and one the day after and she has been well controlled. She uses it 1-2 times a month on average and has been maintaining without issues.    Past Medical History: Patient  has a past medical history of GERD (gastroesophageal reflux disease), H/O pilonidal cyst (2002 ), History of hiatal hernia, Hypertension, PID (acute pelvic inflammatory disease) (2006 ), Pneumonia (2016), Vitamin D  deficiency (2015 ), and Wears dentures.   Past Surgical History: She  has a past surgical history that includes Cesarean section; Cyst excision (Right, 2003); Nasal sinus surgery (Bilateral, 02/10/2020); Vaginal hysterectomy (N/A, 06/08/2020); Vaginal prolapse repair (N/A, 06/08/2020); Anterior and posterior repair (N/A, 06/08/2020); and Cystoscopy (N/A, 06/08/2020).   Medications: She has a current medication list which includes the following prescription(s): amlodipine , blood pressure kit, cefadroxil , cetirizine , conjugated estrogens , fluticasone , gabapentin , hydrochlorothiazide , meloxicam , metronidazole , montelukast , oxybutynin , ozempic (2 mg/dose), phenazopyridine , polyethylene glycol powder, sulfamethoxazole -trimethoprim , and vitamin d  (ergocalciferol ).   Allergies: Patient is allergic to tape.   Social History: Patient  reports that she has quit smoking. Her smoking use included cigarettes. She has a 10 pack-year smoking history. She uses smokeless tobacco. She reports that she does not drink alcohol and does not use drugs.     OBJECTIVE     Physical Exam: Vitals:   01/16/24 1243  BP: 107/75  Pulse: 73   Gen: No apparent distress, A&O  x 3.  Detailed Urogynecologic Evaluation:  Deferred.    ASSESSMENT AND PLAN    Ms. Micheli is a 48 y.o. with:  1. OAB (overactive bladder)   2. Leukocytes in urine    Well controlled with dietary/lifestyle management  management at this time.  Will continue patient on self-start regimen as she is not over-using it and is well controlled with this plan of care. We discussed that should she have issues with recurrent UTI, BV, or yeast we will need to further investigate and make a new plan.   Patient to follow up in 6 months or sooner if needed.    Vira Chaplin G Kalid Ghan, NP

## 2024-02-19 ENCOUNTER — Encounter: Payer: Self-pay | Admitting: *Deleted

## 2024-04-18 ENCOUNTER — Ambulatory Visit (INDEPENDENT_AMBULATORY_CARE_PROVIDER_SITE_OTHER)
